# Patient Record
Sex: Female | Born: 1945
Health system: Southern US, Community
[De-identification: ages and names within clinical notes are randomized; demographics above are authoritative.]

## PROBLEM LIST (undated history)

## (undated) DIAGNOSIS — J439 Emphysema, unspecified: Secondary | ICD-10-CM

## (undated) DIAGNOSIS — F1010 Alcohol abuse, uncomplicated: Secondary | ICD-10-CM

## (undated) DIAGNOSIS — M810 Age-related osteoporosis without current pathological fracture: Secondary | ICD-10-CM

## (undated) DIAGNOSIS — D649 Anemia, unspecified: Secondary | ICD-10-CM

## (undated) DIAGNOSIS — E871 Hypo-osmolality and hyponatremia: Secondary | ICD-10-CM

## (undated) DIAGNOSIS — J449 Chronic obstructive pulmonary disease, unspecified: Secondary | ICD-10-CM

## (undated) DIAGNOSIS — C801 Malignant (primary) neoplasm, unspecified: Secondary | ICD-10-CM

## (undated) DIAGNOSIS — C3490 Malignant neoplasm of unspecified part of unspecified bronchus or lung: Secondary | ICD-10-CM

## (undated) DIAGNOSIS — D0510 Intraductal carcinoma in situ of unspecified breast: Secondary | ICD-10-CM

## (undated) DIAGNOSIS — N3281 Overactive bladder: Secondary | ICD-10-CM

## (undated) DIAGNOSIS — R0600 Dyspnea, unspecified: Secondary | ICD-10-CM

## (undated) DIAGNOSIS — I739 Peripheral vascular disease, unspecified: Secondary | ICD-10-CM

## (undated) DIAGNOSIS — Z720 Tobacco use: Secondary | ICD-10-CM

## (undated) HISTORY — PX: MASTECTOMY: SHX3

## (undated) HISTORY — DX: Hypo-osmolality and hyponatremia: E87.1

## (undated) HISTORY — PX: RETINAL DETACHMENT SURGERY: SHX105

## (undated) HISTORY — DX: Tobacco use: Z72.0

## (undated) HISTORY — PX: BREAST SURGERY: SHX581

## (undated) HISTORY — DX: Emphysema, unspecified: J43.9

## (undated) HISTORY — DX: Intraductal carcinoma in situ of unspecified breast: D05.10

## (undated) HISTORY — DX: Malignant (primary) neoplasm, unspecified: C80.1

## (undated) HISTORY — PX: EYE SURGERY: SHX253

## (undated) HISTORY — DX: Malignant neoplasm of unspecified part of unspecified bronchus or lung: C34.90

## (undated) HISTORY — DX: Age-related osteoporosis without current pathological fracture: M81.0

## (undated) HISTORY — PX: IR FIBRIN GLUE REPAIR ANAL FISTULA: IMG2325

## (undated) HISTORY — DX: Chronic obstructive pulmonary disease, unspecified: J44.9

## (undated) HISTORY — DX: Peripheral vascular disease, unspecified: I73.9

## (undated) HISTORY — DX: Overactive bladder: N32.81

## (undated) HISTORY — PX: OTHER SURGICAL HISTORY: SHX169

## (undated) HISTORY — PX: VASCULAR SURGERY: SHX849

---

## 2012-01-26 DIAGNOSIS — C50519 Malignant neoplasm of lower-outer quadrant of unspecified female breast: Secondary | ICD-10-CM | POA: Insufficient documentation

## 2012-10-04 DIAGNOSIS — H543 Unqualified visual loss, both eyes: Secondary | ICD-10-CM | POA: Insufficient documentation

## 2013-03-08 DIAGNOSIS — Z0000 Encounter for general adult medical examination without abnormal findings: Secondary | ICD-10-CM | POA: Insufficient documentation

## 2013-03-08 DIAGNOSIS — N3281 Overactive bladder: Secondary | ICD-10-CM | POA: Insufficient documentation

## 2013-05-04 DIAGNOSIS — J309 Allergic rhinitis, unspecified: Secondary | ICD-10-CM | POA: Insufficient documentation

## 2014-05-25 DIAGNOSIS — Z853 Personal history of malignant neoplasm of breast: Secondary | ICD-10-CM | POA: Insufficient documentation

## 2016-08-22 DIAGNOSIS — E871 Hypo-osmolality and hyponatremia: Secondary | ICD-10-CM | POA: Insufficient documentation

## 2016-09-24 DIAGNOSIS — R918 Other nonspecific abnormal finding of lung field: Secondary | ICD-10-CM | POA: Insufficient documentation

## 2016-10-01 DIAGNOSIS — F17200 Nicotine dependence, unspecified, uncomplicated: Secondary | ICD-10-CM | POA: Insufficient documentation

## 2016-12-02 LAB — PULMONARY FUNCTION TEST

## 2017-06-22 DIAGNOSIS — R2681 Unsteadiness on feet: Secondary | ICD-10-CM | POA: Insufficient documentation

## 2017-06-22 DIAGNOSIS — R208 Other disturbances of skin sensation: Secondary | ICD-10-CM | POA: Insufficient documentation

## 2017-06-22 DIAGNOSIS — R200 Anesthesia of skin: Secondary | ICD-10-CM | POA: Insufficient documentation

## 2017-10-26 DIAGNOSIS — E278 Other specified disorders of adrenal gland: Secondary | ICD-10-CM | POA: Insufficient documentation

## 2017-10-27 DIAGNOSIS — K921 Melena: Secondary | ICD-10-CM | POA: Insufficient documentation

## 2018-01-21 DIAGNOSIS — M810 Age-related osteoporosis without current pathological fracture: Secondary | ICD-10-CM | POA: Insufficient documentation

## 2018-02-02 DIAGNOSIS — D509 Iron deficiency anemia, unspecified: Secondary | ICD-10-CM | POA: Insufficient documentation

## 2018-12-21 ENCOUNTER — Encounter: Payer: Self-pay | Admitting: Neurology

## 2019-01-24 ENCOUNTER — Encounter: Payer: Self-pay | Admitting: Neurology

## 2019-01-24 ENCOUNTER — Ambulatory Visit (INDEPENDENT_AMBULATORY_CARE_PROVIDER_SITE_OTHER): Payer: Medicare Other | Admitting: Neurology

## 2019-01-24 ENCOUNTER — Other Ambulatory Visit: Payer: Self-pay

## 2019-01-24 ENCOUNTER — Other Ambulatory Visit: Payer: Medicare Other

## 2019-01-24 VITALS — BP 137/66 | HR 81 | Ht 62.0 in | Wt 89.0 lb

## 2019-01-24 DIAGNOSIS — G621 Alcoholic polyneuropathy: Secondary | ICD-10-CM

## 2019-01-24 NOTE — Patient Instructions (Addendum)
Continue gabapentin 300-400mg  three times daily  Try to cut back, if not stop, alcohol consumption  If you choose to proceed with physical therapy, please let me know Return to clinic in 6 months   Your provider has requested that you have labwork completed today. Please go to [facility] ([phone]) on the second floor of this building before leaving the office today. You do not need to check in. If you are not called within 15 minutes please check with the front desk.

## 2019-01-24 NOTE — Progress Notes (Signed)
[facility] Clinic Note - Initial Visit   Date: 01/24/19  Beth Daniels MRN: [id number] DOB: 06-25-1946   Dear Radford Pax, NP:  Thank you for your kind referral of Beth Daniels for consultation of bilateral leg paresthesia. Although her history is well known to you, please allow [place] to reiterate it for the purpose of our medical record. The patient was accompanied to the clinic by husband who also provides collateral information.     History of Present Illness: Beth Daniels is a 73 y.o. right-handed female with history of right breast cancer (2013) s/p bilateral mastectomy, COPD, peripheral vascular disease, and tobacco abuse referred for evaluation of bilateral feet dysesthesias.  She moved to [place] in early July.  She has been followed at [facility] neurology from 2018 - 2020 for chronic burning discomfort involving the lower legs since 2013.  She was initially evaluated by Dr Fritzi Mandes, neurologist in [place], [place] in 2015 where MRI brain, EMG of the legs, and lab testing was essentially normal.  In April 2018, she was seen by [facility] vascular surgery and underwent left superficial femoral artery angioplasty and stenting.  There was no change to her leg symptoms following treatment of vascular disease.  In 2018, she establish care with [facility], Rosalie Doctor, NP, for ongoing symptoms.  She underwent repeat electrodiagnostic testing in December 2018 which was normal.  There was suspicion that she may have small fiber neuropathy, however given that it would not change management, she did not undergo skin biopsy and treated symptomatically.  She takes gabapentin 300-400mg  TID, adjusted based on severity of pain.  Currently, she reports having stabbing/burning sensation at the soles of the feet and into the lower legs.   She has been drinking 3-4 glasses of wine daily for the past 30 years. Labs indicate history of folate deficiency, she is taking folate 1mg  daily.  No history of  diabetes or family history of neuropathy.   Out-side paper records, electronic medical record, and images have been reviewed where available and summarized as:   - EMG/NCS (06/2017): no evidence of large fiber neuropathy - Doppler arterial testing with ABI of the legs b/l (09/2013): showed evidence indicative of possible moderate small vessel disease in both feet. No obvious large vessel arterial obstructive disease was noted. The ABI measurements were normal bilaterally. - Brain MRI (07/2013): mild age-related chronic ischemic deep white matter changes. - EMG/NCS (07/2013): Needle EMG testing of the bilateral lower remedies show no abnormalities. NCS of the upper and lower extremities was also normal. In particular, no evidence of a diffuse polyneuropathy was seen. - Labs (2015): thyroid normal, ANA titer 1:40 (speckled), aldolase level normal, SPEP/IFE with no evidence of a monoclonal gammopathy, RF negative, CRP normal, total CK level normal.   Past Medical History:  Diagnosis Date  . Cancer [facility])    breast right  . Chronic hyponatremia   . COPD (chronic obstructive pulmonary disease) ([place])   . DCIS (ductal carcinoma in situ)   . Emphysema of lung ([place])   . Osteoporosis   . Overactive bladder   . PVD (peripheral vascular disease) ([place])   . Tobacco use     Past Surgical History:  Procedure Laterality Date  . aeptoplasty    . cartaract extraction    . IR FIBRIN GLUE REPAIR ANAL FISTULA    . MASTECTOMY     s/p bl  . RETINAL DETACHMENT SURGERY    . sclerotherapy vein leg    . vitreous retinal  surgery       Medications:  Outpatient Encounter Medications as of 01/24/2019  Medication Sig  . albuterol (VENTOLIN HFA) 108 (90 Base) MCG/ACT inhaler Inhale into the lungs.  [name] atorvastatin (LIPITOR) 20 MG tablet Take by mouth daily.  [name] azelastine (ASTELIN) 0.1 % nasal spray USE 1 SPRAY IN EACH NOSTRIL TWICE A DAY  . Calcium Carbonate-Vitamin D (CALCIUM 500 + D) 500-125 MG-UNIT TABS Take  by mouth daily.  . camphor-menthol (SARNA) lotion Apply topically.  . clopidogrel (PLAVIX) 75 MG tablet Take by mouth.  . conjugated estrogens (PREMARIN) vaginal cream Place vaginally.  . Cranberry 400 MG CAPS Take by mouth.  . denosumab (PROLIA) 60 MG/ML SOSY injection Inject into the skin.  . Ferrous Fumarate (HEMOCYTE - 106 MG FE) 324 (106 Fe) MG TABS tablet Take by mouth.  . folic acid (FOLVITE) 1 MG tablet Take by mouth.  . gabapentin (NEURONTIN) 100 MG capsule Take 100 mg by mouth daily.  [name] gabapentin (NEURONTIN) 300 MG capsule Take by mouth.  [name] guaiFENesin (MUCINEX) 600 MG 12 hr tablet Take by mouth.  . magnesium oxide (MAG-OX) 400 MG tablet Take by mouth.  . Multiple Vitamin (MULTI-VITAMIN) tablet Take by mouth.  . mupirocin ointment (BACTROBAN) 2 % Apply topically.  . nicotine polacrilex (COMMIT) 4 MG lozenge Start 4 weeks prior to Quit Day.  Place 1 lozenge between cheek and gum every 15 minutes for urges  . OXYGEN 2 liters at hs  . pantoprazole (PROTONIX) 40 MG tablet TAKE 1 TABLET DAILY  . umeclidinium-vilanterol (ANORO ELLIPTA) 62.5-25 MCG/INH AEPB Inhale into the lungs.   No facility-administered encounter medications on file as of 01/24/2019.     Allergies: No Known Allergies  Family History: Family History  Problem Relation Age of Onset  . Alzheimer's disease Mother   . Pneumonia Father        aspiration pneumonia    Social History: Social History   Tobacco Use  . Smoking status: Former Smoker    Packs/day: 1.50    Years: 50.00    Pack years: 75.00    Start date: 1970    Quit date: 2018    Years since quitting: 2.5  . Smokeless tobacco: Never Used  Substance Use Topics  . Alcohol use: Yes  . Drug use: Not Currently   Social History   Social History Narrative   Right handed, no children, one story with stairs. Lives with husband    Review of Systems:  CONSTITUTIONAL: No fevers, chills, night sweats, or weight loss.   EYES: No visual changes or eye  pain ENT: No hearing changes.  No history of nose bleeds.   RESPIRATORY: No cough, wheezing and shortness of breath.   CARDIOVASCULAR: Negative for chest pain, and palpitations.   GI: Negative for abdominal discomfort, blood in stools or black stools.  No recent change in bowel habits.   GU:  No history of incontinence.   MUSCLOSKELETAL: No history of joint pain or swelling.  No myalgias.   SKIN: Negative for lesions, rash, and itching.   HEMATOLOGY/ONCOLOGY: Negative for prolonged bleeding, bruising easily, and swollen nodes.  +history of cancer.   ENDOCRINE: Negative for cold or heat intolerance, polydipsia or goiter.   PSYCH:  No depression or anxiety symptoms.   NEURO: As Above.   Vital Signs:  BP 137/66   Pulse 81   Ht 5\' 2"  (1.575 m)   Wt 89 lb (40.4 kg)   SpO2 95%   BMI 16.28 kg/m  General Medical Exam:   General:  Thin-appearing, comfortable.   Eyes/ENT: see cranial nerve examination.   Neck:   No carotid bruits. Respiratory:  Clear to auscultation, good air entry bilaterally.   Cardiac:  Regular rate and rhythm, no murmur.   Extremities:  No deformities, edema, or skin discoloration.  Skin:  Multiple ecchymosis and hyperpigmentation of the legs   Neurological Exam: MENTAL STATUS including orientation to time, place, person, recent and remote memory, attention span and concentration, language, and fund of knowledge is normal.  Speech is not dysarthric.  CRANIAL NERVES: II:  No visual field defects.   III-IV-VI: Pupils equal round and reactive to light.  Normal conjugate, extra-ocular eye movements in all directions of gaze.  No nystagmus.  No ptosis.   V:  Normal facial sensation.    VII:  Normal facial symmetry and movements.   VIII:  Normal hearing and vestibular function.   IX-X:  Normal palatal movement.   XI:  Normal shoulder shrug and head rotation.   XII:  Normal tongue strength and range of motion, no deviation or fasciculation.  MOTOR:  Generalized  loss of muscle bulk throughout.  No atrophy, fasciculations or abnormal movements.  No pronator drift.   Upper Extremity:  Right  Left  Deltoid  5/5   5/5   Biceps  5/5   5/5   Triceps  5/5   5/5   Infraspinatus 5/5  5/5  Medial pectoralis 5/5  5/5  Wrist extensors  5/5   5/5   Wrist flexors  5/5   5/5   Finger extensors  5/5   5/5   Finger flexors  5/5   5/5   Dorsal interossei  5/5   5/5   Abductor pollicis  5/5   5/5   Tone (Ashworth scale)  0  0   Lower Extremity:  Right  Left  Hip flexors  5/5   5/5   Hip extensors  5/5   5/5   Adductor 5/5  5/5  Abductor 5/5  5/5  Knee flexors  5/5   5/5   Knee extensors  5/5   5/5   Dorsiflexors  5/5   5/5   Plantarflexors  5/5   5/5   Toe extensors  5/5   5/5   Toe flexors  5-/5   5-/5   Tone (Ashworth scale)  0  0   MSRs:  Right        Left                  brachioradialis 2+  2+  biceps 2+  2+  triceps 2+  2+  patellar 2+  2+  ankle jerk 1+  1+  [place] no  no  plantar response down  down   SENSORY:  Normal and symmetric perception of light touch, pinprick, vibration.  [place]'s sign present.   COORDINATION/GAIT: Normal finger-to- nose-finger and heel-to-shin.  Intact rapid alternating movements bilaterally.  Gait is mildly wide-based, assisted with cane, stable.  She can stand on heels and toes.    IMPRESSION: Probable small fiber neuropathy due to alcohol use manifesting with chronic bilateral lower leg burning dysesthesias since 2013.  She has underwent NCS/EMG x 2 most recently in 2018 which did not show a large fiber neuropathy.  Skin biopsy was not performed given that it would not change management.  She has been managed symptomatically with gabapentin 300-400mg  TID for pain, which seems to be effective.  She will contact  my office when she is in need of refills.  Physical therapy was declined at this time.  Fall precautions discussed.    I stressed the importance of trying to cut back, if not stop, alcohol use as this  is the underlying etiology to her leg pain.  It was explained that alcohol itself is neurotoxic and as well as associated vitamin deficiencies which further potentiates neural injury.  Check vitamin B12, vitamin B1, folate.  She was encouraged to eat three nutritious meals daily to ensure she gets appropriate requirement of essential vitamins/minerals and calories.  Her weight is low and I suspect alcohol is suppressing her appetite.     Return to clinic in 6 months.    Thank you for allowing me to participate in patient's care.  If I can answer any additional questions, I would be pleased to do so.    Sincerely,    Lizania Bouchard K. Posey Pronto, DO

## 2019-01-27 ENCOUNTER — Ambulatory Visit (HOSPITAL_COMMUNITY): Payer: Medicare Other

## 2019-01-27 ENCOUNTER — Ambulatory Visit (INDEPENDENT_AMBULATORY_CARE_PROVIDER_SITE_OTHER)
Admission: EM | Admit: 2019-01-27 | Discharge: 2019-01-27 | Disposition: A | Discharge status: Home / Self Care | Payer: Medicare Other | Source: Home / Self Care | Attending: Emergency Medicine | Admitting: Emergency Medicine

## 2019-01-27 ENCOUNTER — Other Ambulatory Visit: Payer: Self-pay

## 2019-01-27 ENCOUNTER — Encounter (HOSPITAL_COMMUNITY): Payer: Self-pay | Admitting: Emergency Medicine

## 2019-01-27 DIAGNOSIS — Z86000 Personal history of in-situ neoplasm of breast: Secondary | ICD-10-CM | POA: Insufficient documentation

## 2019-01-27 DIAGNOSIS — S300XXA Contusion of lower back and pelvis, initial encounter: Secondary | ICD-10-CM

## 2019-01-27 DIAGNOSIS — M810 Age-related osteoporosis without current pathological fracture: Secondary | ICD-10-CM | POA: Insufficient documentation

## 2019-01-27 DIAGNOSIS — Z87891 Personal history of nicotine dependence: Secondary | ICD-10-CM | POA: Insufficient documentation

## 2019-01-27 DIAGNOSIS — J90 Pleural effusion, not elsewhere classified: Secondary | ICD-10-CM | POA: Insufficient documentation

## 2019-01-27 DIAGNOSIS — J918 Pleural effusion in other conditions classified elsewhere: Secondary | ICD-10-CM | POA: Diagnosis not present

## 2019-01-27 DIAGNOSIS — J439 Emphysema, unspecified: Secondary | ICD-10-CM | POA: Insufficient documentation

## 2019-01-27 DIAGNOSIS — G629 Polyneuropathy, unspecified: Secondary | ICD-10-CM | POA: Insufficient documentation

## 2019-01-27 DIAGNOSIS — R0789 Other chest pain: Secondary | ICD-10-CM | POA: Insufficient documentation

## 2019-01-27 DIAGNOSIS — W19XXXA Unspecified fall, initial encounter: Secondary | ICD-10-CM | POA: Insufficient documentation

## 2019-01-27 DIAGNOSIS — Z9013 Acquired absence of bilateral breasts and nipples: Secondary | ICD-10-CM | POA: Insufficient documentation

## 2019-01-27 DIAGNOSIS — Z853 Personal history of malignant neoplasm of breast: Secondary | ICD-10-CM | POA: Insufficient documentation

## 2019-01-27 DIAGNOSIS — I739 Peripheral vascular disease, unspecified: Secondary | ICD-10-CM | POA: Insufficient documentation

## 2019-01-27 DIAGNOSIS — Z79899 Other long term (current) drug therapy: Secondary | ICD-10-CM | POA: Insufficient documentation

## 2019-01-27 DIAGNOSIS — Z7902 Long term (current) use of antithrombotics/antiplatelets: Secondary | ICD-10-CM | POA: Insufficient documentation

## 2019-01-27 NOTE — ED Triage Notes (Signed)
PT fell Friday night. PT fell backwards onto the floor. PT reports she hit her tailbone, back, and head. No LOC  PT has continued lower back pain and pain over right rib when taking a deep breath.   PT has history of osteoporosis

## 2019-01-27 NOTE — ED Provider Notes (Signed)
HPI  SUBJECTIVE:  Beth Daniels is a 73 y.o. female who presents with bilateral low back pain and right anterior chest pain starting 4 days ago after losing her balance and falling, landing on her sacrum, back, hitting the back of her head onto a tile floor.  No LOC.  No nausea, vomiting, discoordination, visual changes, dysarthria, arm or leg weakness, neck pain.  She states the back pain is constant, achy, getting worse.  She tried a lidocaine patch for that with some improvement in her symptoms, symptoms are worse with bending forward, walking, torso movement.  No urinary or fecal incontinence, urinary retention, saddle anesthesia.  The pain does not radiate down the back of her leg.  No leg weakness.  No change in her baseline numbness in her feet.  She also reports right front rib pain is present with deep inspiration only.  Denies any direct trauma to this area.  It is not associated with torso rotation, coughing, sneezing.  She notes a low oxygen saturation on Saturday and Sunday morning improved with deep breathing.  She has a past medical history of osteoporosis, COPD on supplemental oxygen at night, she is on Plavix for stent in her left thigh, she has a history of breast cancer status post bilateral mastectomy.  Also peripheral neuropathy, GI bleed, coronary disease.  No history of pneumothorax, head injury, SAH/ICH.  PMD: Shon Baton, MD   Past Medical History:  Diagnosis Date  . Cancer [facility])    breast right  . Chronic hyponatremia   . COPD (chronic obstructive pulmonary disease) ([place])   . DCIS (ductal carcinoma in situ)   . Emphysema of lung ([place])   . Osteoporosis   . Overactive bladder   . PVD (peripheral vascular disease) ([place])   . Tobacco use     Past Surgical History:  Procedure Laterality Date  . aeptoplasty    . cartaract extraction    . IR FIBRIN GLUE REPAIR ANAL FISTULA    . MASTECTOMY     s/p bl  . RETINAL DETACHMENT SURGERY    . sclerotherapy vein leg    . vitreous  retinal surgery      Family History  Problem Relation Age of Onset  . Alzheimer's disease Mother   . Pneumonia Father        aspiration pneumonia    Social History   Tobacco Use  . Smoking status: Former Smoker    Packs/day: 1.50    Years: 50.00    Pack years: 75.00    Start date: 1970    Quit date: 2018    Years since quitting: 2.5  . Smokeless tobacco: Never Used  Substance Use Topics  . Alcohol use: Yes  . Drug use: Not Currently    No current facility-administered medications for this encounter.   Current Outpatient Medications:  .  albuterol (VENTOLIN HFA) 108 (90 Base) MCG/ACT inhaler, Inhale into the lungs., Disp: , Rfl:  .  atorvastatin (LIPITOR) 20 MG tablet, Take by mouth daily., Disp: , Rfl:  .  azelastine (ASTELIN) 0.1 % nasal spray, USE 1 SPRAY IN EACH NOSTRIL TWICE A DAY, Disp: , Rfl:  .  Calcium Carbonate-Vitamin D (CALCIUM 500 + D) 500-125 MG-UNIT TABS, Take by mouth daily., Disp: , Rfl:  .  camphor-menthol (SARNA) lotion, Apply topically., Disp: , Rfl:  .  clopidogrel (PLAVIX) 75 MG tablet, Take by mouth., Disp: , Rfl:  .  conjugated estrogens (PREMARIN) vaginal cream, Place vaginally., Disp: , Rfl:  .  Cranberry 400 MG CAPS, Take by mouth., Disp: , Rfl:  .  denosumab (PROLIA) 60 MG/ML SOSY injection, Inject into the skin., Disp: , Rfl:  .  Ferrous Fumarate (HEMOCYTE - 106 MG FE) 324 (106 Fe) MG TABS tablet, Take by mouth., Disp: , Rfl:  .  folic acid (FOLVITE) 1 MG tablet, Take by mouth., Disp: , Rfl:  .  gabapentin (NEURONTIN) 100 MG capsule, Take 100 mg by mouth daily., Disp: , Rfl:  .  gabapentin (NEURONTIN) 300 MG capsule, Take by mouth., Disp: , Rfl:  .  guaiFENesin (MUCINEX) 600 MG 12 hr tablet, Take by mouth., Disp: , Rfl:  .  magnesium oxide (MAG-OX) 400 MG tablet, Take by mouth., Disp: , Rfl:  .  Multiple Vitamin (MULTI-VITAMIN) tablet, Take by mouth., Disp: , Rfl:  .  mupirocin ointment (BACTROBAN) 2 %, Apply topically., Disp: , Rfl:  .   nicotine polacrilex (COMMIT) 4 MG lozenge, Start 4 weeks prior to Quit Day.  Place 1 lozenge between cheek and gum every 15 minutes for urges, Disp: , Rfl:  .  OXYGEN, 2 liters at hs, Disp: , Rfl:  .  pantoprazole (PROTONIX) 40 MG tablet, TAKE 1 TABLET DAILY, Disp: , Rfl:  .  umeclidinium-vilanterol (ANORO ELLIPTA) 62.5-25 MCG/INH AEPB, Inhale into the lungs., Disp: , Rfl:   No Known Allergies   ROS  As noted in HPI.   Physical Exam  BP (!) 151/81 (BP Location: Left Arm)   Pulse 94   Temp 98.1 F (36.7 C) (Oral)   Resp 16   SpO2 97%   Constitutional: Well developed, well nourished, no acute distress Eyes: PERRL, EOMI, conjunctiva normal bilaterally HENT: Normocephalic, atraumatic,mucus membranes moist Respiratory: Good respiratory effort, clear to auscultation bilaterally, no rales, no wheezing, no rhonchi.  Bilateral mastectomy.  No bruising, rash.  Positive anterior tenderness along ribs 3, 4 and in between the ribs.  No crepitus. Cardiovascular: Normal rate and rhythm, no murmurs, no gallops, no rubs GI: Nondistended Back: No bruising.  Positive bony tenderness at L4, L5, S1.  Mild bilateral paralumbar tenderness.  Back pain aggravated with left hip flexion against resistance and right hip external rotation.  Range of motion of the hip is otherwise not painful..Bilateral lower extremities nontender, baseline ROM with intact DP  pulses,  SLR neg bilaterally. Sensation baseline light touch bilaterally for Pt,  Motor symmetric bilateral 5/5 hip flexion, quadriceps, hamstrings, EHL, foot dorsiflexion, foot plantarflexion, gait somewhat antalgic but without apparent new ataxia. skin: No rash, skin intact Musculoskeletal: No edema, no tenderness, no deformities Neurologic: Alert & oriented x 3, CN II-XII grossly intact, no motor deficits, sensation grossly intact Psychiatric: Speech and behavior appropriate   ED Course   Medications - No data to display  Orders Placed This  Encounter  Procedures  . DG Ribs Unilateral W/Chest Right    Standing Status:   Standing    Number of Occurrences:   1    Order Specific Question:   Reason for Exam (SYMPTOM  OR DIAGNOSIS REQUIRED)    Answer:   fall tenderness rib 3-4 r/o fx ptx  . DG Lumbar Spine Complete    Standing Status:   Standing    Number of Occurrences:   1    Order Specific Question:   Reason for Exam (SYMPTOM  OR DIAGNOSIS REQUIRED)    Answer:   fall tenderness rib 3-4 r/o fx ptx   No results found for this or any previous visit (from the past 24  hour(s)). Dg Ribs Unilateral W/chest Right  Result Date: 01/27/2019 CLINICAL DATA:  Fall, right rib tenderness. EXAM: RIGHT RIBS AND CHEST - 3+ VIEW COMPARISON:  None. FINDINGS: There is hyperinflation of the lungs compatible with COPD. Heart is normal size. Lungs clear. Small right pleural effusion with blunting of the right costophrenic angle. No pneumothorax. No acute bony abnormality. No visible rib fracture. IMPRESSION: Small right pleural effusion. No visible pneumothorax or rib fracture. COPD. Electronically Signed   By: Rolm Baptise M.D.   On: 01/27/2019 11:43   Dg Lumbar Spine Complete  Result Date: 01/27/2019 CLINICAL DATA:  Pt fell on Sunday Low back pain Right ant rib pain EXAM: LUMBAR SPINE - COMPLETE 4+ VIEW COMPARISON:  None. FINDINGS: There is normal alignment of the lumbar spine. Disc height loss identified primarily at L2-3, L3-4. There is uncovertebral spurring and facet hypertrophy at these levels. No acute fracture or spondylolisthesis. Bowel gas pattern is nonobstructive. There is moderate atherosclerosis of the abdominal aorta. IMPRESSION: 1. Degenerative changes. 2. No evidence for acute abnormality. Electronically Signed   By: Nolon Nations M.D.   On: 01/27/2019 11:46    ED Clinical Impression  1. Fall, initial encounter   2. Lumbar contusion, initial encounter   3. Chest wall pain      ED Assessment/Plan   1.  Back pain.  Obtaining  L-spine x-ray due to bony tenderness and osteoporosis.  No evidence of cauda equina syndrome today.  2 anterior rib pain.  Getting right-sided rib series to rule out pneumothorax, fracture.  Patient has no neurologic complaints, no headache, is neurologically intact, and even though she is on Plavix, I think that we can defer head CT today.  Discussed with her that she is at risk for subdural or up to a week after the injury, also will have her keep a close eye on this, she is to go to the ER for headache or any neurologic complaints.  Reviewed imaging independently.  Small right-sided effusion.  No pneumothorax, rib fracture.  No previous x-ray for comparison.  L-spine, degenerative changes, no acute abnormalities.. See radiology report for full details.  Patient status post mechanical fall with a lumbar contusion and right chest wall pain.  Patient also with a small pleural effusion, not sure if this is new or not as there are no previous x-rays for comparison.  She has follow-up with her pulmonologist soon and will discuss this with her pulmonologist.  Faxing today's note to Dr. Shon Baton.   Home with Tylenol 1 g 3-4 times a day as needed for pain.  I am hesitant to prescribe opiates as it increases her risk of fall.  She agrees with this.  Discussed imaging, MDM, treatment plan, and plan for follow-up with patient Discussed sn/sx that should prompt return to the ED. patient agrees with plan.   No orders of the defined types were placed in this encounter.   *This clinic note was created using Dragon dictation software. Therefore, there may be occasional mistakes despite careful proofreading.  ?   Melynda Ripple, MD 01/27/19 1247

## 2019-01-27 NOTE — Discharge Instructions (Addendum)
Your rib XRays did not show a collapsed lung or fracture.  Your back x-rays were also negative for fracture.  1 g of Tylenol 3 or 4 times a day as needed for pain.  Follow-up with your doctor if not getting better in a week, go to the ER for headache, trouble talking, walking, slurred speech, visual changes, arm or leg weakness, numbness, any strokelike symptoms, or for pain not controlled with medications.

## 2019-01-29 ENCOUNTER — Inpatient Hospital Stay (HOSPITAL_COMMUNITY)
Admission: EM | Admit: 2019-01-29 | Discharge: 2019-02-02 | DRG: 480 | Disposition: A | Discharge status: Home Health | Payer: Medicare Other | Attending: Internal Medicine | Admitting: Internal Medicine

## 2019-01-29 ENCOUNTER — Other Ambulatory Visit: Payer: Self-pay

## 2019-01-29 ENCOUNTER — Emergency Department (HOSPITAL_COMMUNITY): Payer: Medicare Other

## 2019-01-29 ENCOUNTER — Encounter (HOSPITAL_COMMUNITY): Payer: Self-pay | Admitting: Emergency Medicine

## 2019-01-29 ENCOUNTER — Inpatient Hospital Stay (HOSPITAL_COMMUNITY): Payer: Medicare Other

## 2019-01-29 DIAGNOSIS — J439 Emphysema, unspecified: Secondary | ICD-10-CM | POA: Diagnosis present

## 2019-01-29 DIAGNOSIS — W1811XA Fall from or off toilet without subsequent striking against object, initial encounter: Secondary | ICD-10-CM | POA: Diagnosis present

## 2019-01-29 DIAGNOSIS — Z20828 Contact with and (suspected) exposure to other viral communicable diseases: Secondary | ICD-10-CM | POA: Diagnosis not present

## 2019-01-29 DIAGNOSIS — M810 Age-related osteoporosis without current pathological fracture: Secondary | ICD-10-CM | POA: Diagnosis present

## 2019-01-29 DIAGNOSIS — S72142A Displaced intertrochanteric fracture of left femur, initial encounter for closed fracture: Principal | ICD-10-CM | POA: Diagnosis present

## 2019-01-29 DIAGNOSIS — Z9981 Dependence on supplemental oxygen: Secondary | ICD-10-CM | POA: Diagnosis not present

## 2019-01-29 DIAGNOSIS — Z9013 Acquired absence of bilateral breasts and nipples: Secondary | ICD-10-CM | POA: Diagnosis not present

## 2019-01-29 DIAGNOSIS — S5002XA Contusion of left elbow, initial encounter: Secondary | ICD-10-CM | POA: Diagnosis present

## 2019-01-29 DIAGNOSIS — Z87891 Personal history of nicotine dependence: Secondary | ICD-10-CM | POA: Diagnosis not present

## 2019-01-29 DIAGNOSIS — Z01818 Encounter for other preprocedural examination: Secondary | ICD-10-CM

## 2019-01-29 DIAGNOSIS — Z853 Personal history of malignant neoplasm of breast: Secondary | ICD-10-CM | POA: Diagnosis not present

## 2019-01-29 DIAGNOSIS — Y92012 Bathroom of single-family (private) house as the place of occurrence of the external cause: Secondary | ICD-10-CM | POA: Diagnosis not present

## 2019-01-29 DIAGNOSIS — J9611 Chronic respiratory failure with hypoxia: Secondary | ICD-10-CM | POA: Diagnosis present

## 2019-01-29 DIAGNOSIS — E871 Hypo-osmolality and hyponatremia: Secondary | ICD-10-CM | POA: Diagnosis present

## 2019-01-29 DIAGNOSIS — E43 Unspecified severe protein-calorie malnutrition: Secondary | ICD-10-CM | POA: Diagnosis not present

## 2019-01-29 DIAGNOSIS — Z820 Family history of epilepsy and other diseases of the nervous system: Secondary | ICD-10-CM

## 2019-01-29 DIAGNOSIS — Z681 Body mass index (BMI) 19 or less, adult: Secondary | ICD-10-CM

## 2019-01-29 DIAGNOSIS — G621 Alcoholic polyneuropathy: Secondary | ICD-10-CM | POA: Diagnosis present

## 2019-01-29 DIAGNOSIS — E875 Hyperkalemia: Secondary | ICD-10-CM | POA: Diagnosis not present

## 2019-01-29 DIAGNOSIS — D62 Acute posthemorrhagic anemia: Secondary | ICD-10-CM | POA: Diagnosis not present

## 2019-01-29 DIAGNOSIS — R0600 Dyspnea, unspecified: Secondary | ICD-10-CM

## 2019-01-29 DIAGNOSIS — Z7902 Long term (current) use of antithrombotics/antiplatelets: Secondary | ICD-10-CM | POA: Diagnosis not present

## 2019-01-29 DIAGNOSIS — Z95820 Peripheral vascular angioplasty status with implants and grafts: Secondary | ICD-10-CM

## 2019-01-29 DIAGNOSIS — F1029 Alcohol dependence with unspecified alcohol-induced disorder: Secondary | ICD-10-CM

## 2019-01-29 DIAGNOSIS — I4510 Unspecified right bundle-branch block: Secondary | ICD-10-CM | POA: Diagnosis present

## 2019-01-29 DIAGNOSIS — E559 Vitamin D deficiency, unspecified: Secondary | ICD-10-CM | POA: Diagnosis present

## 2019-01-29 DIAGNOSIS — F1020 Alcohol dependence, uncomplicated: Secondary | ICD-10-CM | POA: Diagnosis present

## 2019-01-29 DIAGNOSIS — I739 Peripheral vascular disease, unspecified: Secondary | ICD-10-CM

## 2019-01-29 DIAGNOSIS — N179 Acute kidney failure, unspecified: Secondary | ICD-10-CM | POA: Diagnosis not present

## 2019-01-29 DIAGNOSIS — S72002A Fracture of unspecified part of neck of left femur, initial encounter for closed fracture: Secondary | ICD-10-CM | POA: Diagnosis not present

## 2019-01-29 DIAGNOSIS — S72002D Fracture of unspecified part of neck of left femur, subsequent encounter for closed fracture with routine healing: Secondary | ICD-10-CM | POA: Diagnosis not present

## 2019-01-29 DIAGNOSIS — E785 Hyperlipidemia, unspecified: Secondary | ICD-10-CM | POA: Diagnosis present

## 2019-01-29 DIAGNOSIS — J449 Chronic obstructive pulmonary disease, unspecified: Secondary | ICD-10-CM

## 2019-01-29 DIAGNOSIS — M549 Dorsalgia, unspecified: Secondary | ICD-10-CM | POA: Diagnosis present

## 2019-01-29 DIAGNOSIS — J441 Chronic obstructive pulmonary disease with (acute) exacerbation: Secondary | ICD-10-CM

## 2019-01-29 DIAGNOSIS — D539 Nutritional anemia, unspecified: Secondary | ICD-10-CM | POA: Diagnosis present

## 2019-01-29 DIAGNOSIS — W19XXXA Unspecified fall, initial encounter: Secondary | ICD-10-CM

## 2019-01-29 DIAGNOSIS — T148XXA Other injury of unspecified body region, initial encounter: Secondary | ICD-10-CM

## 2019-01-29 HISTORY — DX: Alcohol abuse, uncomplicated: F10.10

## 2019-01-29 LAB — CBC WITH DIFFERENTIAL/PLATELET
Abs Immature Granulocytes: 0.02 10*3/uL (ref 0.00–0.07)
Basophils Absolute: 0 10*3/uL (ref 0.0–0.1)
Basophils Relative: 0 %
Eosinophils Absolute: 0 10*3/uL (ref 0.0–0.5)
Eosinophils Relative: 0 %
HCT: 34.7 % — ABNORMAL LOW (ref 36.0–46.0)
Hemoglobin: 11.6 g/dL — ABNORMAL LOW (ref 12.0–15.0)
Immature Granulocytes: 0 %
Lymphocytes Relative: 17 %
Lymphs Abs: 0.8 10*3/uL (ref 0.7–4.0)
MCH: 34.8 pg — ABNORMAL HIGH (ref 26.0–34.0)
MCHC: 33.4 g/dL (ref 30.0–36.0)
MCV: 104.2 fL — ABNORMAL HIGH (ref 80.0–100.0)
Monocytes Absolute: 0.3 10*3/uL (ref 0.1–1.0)
Monocytes Relative: 6 %
Neutro Abs: 3.5 10*3/uL (ref 1.7–7.7)
Neutrophils Relative %: 77 %
Platelets: 279 10*3/uL (ref 150–400)
RBC: 3.33 MIL/uL — ABNORMAL LOW (ref 3.87–5.11)
RDW: 15.2 % (ref 11.5–15.5)
WBC: 4.6 10*3/uL (ref 4.0–10.5)
nRBC: 0 % (ref 0.0–0.2)

## 2019-01-29 LAB — BASIC METABOLIC PANEL
Anion gap: 15 (ref 5–15)
BUN: <5 mg/dL — ABNORMAL LOW (ref 8–23)
CO2: 21 mmol/L — ABNORMAL LOW (ref 22–32)
Calcium: 8.4 mg/dL — ABNORMAL LOW (ref 8.9–10.3)
Chloride: 96 mmol/L — ABNORMAL LOW (ref 98–111)
Creatinine, Ser: 0.36 mg/dL — ABNORMAL LOW (ref 0.44–1.00)
GFR calc Af Amer: >60 mL/min (ref >60)
GFR calc non Af Amer: >60 mL/min (ref >60)
Glucose, Bld: 72 mg/dL (ref 70–99)
Potassium: 4.1 mmol/L (ref 3.5–5.1)
Sodium: 132 mmol/L — ABNORMAL LOW (ref 135–145)

## 2019-01-29 LAB — SARS CORONAVIRUS 2 BY RT PCR (HOSPITAL ORDER, PERFORMED IN ~~LOC~~ HOSPITAL LAB): SARS Coronavirus 2: NEGATIVE

## 2019-01-29 LAB — PROTIME-INR
INR: 1 (ref 0.8–1.2)
Prothrombin Time: 12.6 seconds (ref 11.4–15.2)

## 2019-01-29 LAB — ABO/RH: ABO/RH(D): O POS

## 2019-01-29 MED ORDER — SODIUM CHLORIDE 0.9 % IV SOLN
INTRAVENOUS | Status: DC
  Administered 2019-01-29: 14:00:00 via INTRAVENOUS

## 2019-01-29 MED ORDER — CEFAZOLIN SODIUM-DEXTROSE 2-4 GM/100ML-% IV SOLN
2.0000 g | INTRAVENOUS | Status: AC
  Administered 2019-01-30: 2 g via INTRAVENOUS
  Filled 2019-01-29: qty 100

## 2019-01-29 MED ORDER — GUAIFENESIN ER 600 MG PO TB12
1200.0000 mg | ORAL_TABLET | Freq: Two times a day (BID) | ORAL | Status: DC
  Administered 2019-01-29 – 2019-02-02 (×7): 1200 mg via ORAL
  Filled 2019-01-29 (×7): qty 2

## 2019-01-29 MED ORDER — ADULT MULTIVITAMIN W/MINERALS CH
1.0000 | ORAL_TABLET | Freq: Every day | ORAL | Status: DC
  Administered 2019-01-31 – 2019-02-02 (×3): 1 via ORAL
  Filled 2019-01-29 (×3): qty 1

## 2019-01-29 MED ORDER — ALBUTEROL SULFATE (2.5 MG/3ML) 0.083% IN NEBU
3.0000 mL | INHALATION_SOLUTION | Freq: Three times a day (TID) | RESPIRATORY_TRACT | Status: DC
  Administered 2019-01-30: 08:00:00 3 mL via RESPIRATORY_TRACT
  Filled 2019-01-29: qty 3

## 2019-01-29 MED ORDER — MAGNESIUM OXIDE 400 (241.3 MG) MG PO TABS
200.0000 mg | ORAL_TABLET | Freq: Every day | ORAL | Status: DC
  Administered 2019-01-31 – 2019-02-02 (×3): 200 mg via ORAL
  Filled 2019-01-29 (×3): qty 1

## 2019-01-29 MED ORDER — LORAZEPAM 1 MG PO TABS: 0.0000 mg | ORAL_TABLET | Freq: Four times a day (QID) | ORAL | Status: AC

## 2019-01-29 MED ORDER — GABAPENTIN 300 MG PO CAPS
300.0000 mg | ORAL_CAPSULE | Freq: Every day | ORAL | Status: DC
  Administered 2019-01-29 – 2019-01-31 (×3): 300 mg via ORAL
  Filled 2019-01-29 (×3): qty 1

## 2019-01-29 MED ORDER — FENTANYL CITRATE (PF) 100 MCG/2ML IJ SOLN
50.0000 ug | INTRAMUSCULAR | Status: AC | PRN
  Administered 2019-01-29 (×2): 50 ug via INTRAVENOUS
  Filled 2019-01-29 (×2): qty 2

## 2019-01-29 MED ORDER — METHOCARBAMOL 1000 MG/10ML IJ SOLN
500.0000 mg | Freq: Four times a day (QID) | INTRAVENOUS | Status: DC | PRN
  Filled 2019-01-29: qty 5

## 2019-01-29 MED ORDER — MORPHINE SULFATE (PF) 2 MG/ML IV SOLN
0.5000 mg | INTRAVENOUS | Status: DC | PRN
  Administered 2019-01-30: 01:00:00 0.5 mg via INTRAVENOUS
  Filled 2019-01-29 (×2): qty 1

## 2019-01-29 MED ORDER — CHLORHEXIDINE GLUCONATE 4 % EX LIQD
60.0000 mL | Freq: Once | CUTANEOUS | Status: AC
  Administered 2019-01-30: 4 via TOPICAL
  Filled 2019-01-29: qty 60

## 2019-01-29 MED ORDER — ENSURE PRE-SURGERY PO LIQD
296.0000 mL | Freq: Once | ORAL | Status: DC
  Filled 2019-01-29: qty 296

## 2019-01-29 MED ORDER — PANTOPRAZOLE SODIUM 40 MG PO TBEC
40.0000 mg | DELAYED_RELEASE_TABLET | Freq: Every day | ORAL | Status: DC
  Administered 2019-01-29 – 2019-02-02 (×4): 40 mg via ORAL
  Filled 2019-01-29 (×4): qty 1

## 2019-01-29 MED ORDER — LORAZEPAM 2 MG/ML IJ SOLN: 1.0000 mg | Freq: Four times a day (QID) | INTRAMUSCULAR | Status: AC | PRN

## 2019-01-29 MED ORDER — ENSURE ENLIVE PO LIQD
237.0000 mL | Freq: Three times a day (TID) | ORAL | Status: DC
  Administered 2019-01-29 – 2019-02-02 (×6): 237 mL via ORAL

## 2019-01-29 MED ORDER — AZELASTINE HCL 0.1 % NA SOLN
1.0000 | Freq: Two times a day (BID) | NASAL | Status: DC
  Administered 2019-01-29 – 2019-02-02 (×7): 1 via NASAL
  Filled 2019-01-29: qty 30

## 2019-01-29 MED ORDER — METHOCARBAMOL 500 MG PO TABS
500.0000 mg | ORAL_TABLET | Freq: Four times a day (QID) | ORAL | Status: DC | PRN
  Administered 2019-01-30: 20:00:00 500 mg via ORAL
  Filled 2019-01-29: qty 1

## 2019-01-29 MED ORDER — POVIDONE-IODINE 10 % EX SWAB: 2.0000 "application " | Freq: Once | CUTANEOUS | Status: DC

## 2019-01-29 MED ORDER — THIAMINE HCL 100 MG/ML IJ SOLN: 100.0000 mg | Freq: Every day | INTRAMUSCULAR | Status: DC

## 2019-01-29 MED ORDER — LORAZEPAM 1 MG PO TABS: 1.0000 mg | ORAL_TABLET | Freq: Four times a day (QID) | ORAL | Status: AC | PRN

## 2019-01-29 MED ORDER — HYDROCODONE-ACETAMINOPHEN 5-325 MG PO TABS
1.0000 | ORAL_TABLET | Freq: Four times a day (QID) | ORAL | Status: DC | PRN
  Administered 2019-01-29: 22:00:00 1 via ORAL
  Administered 2019-01-29: 2 via ORAL
  Administered 2019-01-30 – 2019-01-31 (×4): 1 via ORAL
  Filled 2019-01-29 (×5): qty 1
  Filled 2019-01-29: qty 2

## 2019-01-29 MED ORDER — UMECLIDINIUM-VILANTEROL 62.5-25 MCG/INH IN AEPB
1.0000 | INHALATION_SPRAY | Freq: Every day | RESPIRATORY_TRACT | Status: DC
  Administered 2019-01-30 – 2019-01-31 (×2): 1 via RESPIRATORY_TRACT
  Filled 2019-01-29: qty 14

## 2019-01-29 MED ORDER — VITAMIN B-1 100 MG PO TABS
100.0000 mg | ORAL_TABLET | Freq: Every day | ORAL | Status: DC
  Administered 2019-01-29 – 2019-02-02 (×4): 100 mg via ORAL
  Filled 2019-01-29 (×4): qty 1

## 2019-01-29 MED ORDER — FOLIC ACID 1 MG PO TABS
1.0000 mg | ORAL_TABLET | Freq: Every day | ORAL | Status: DC
  Administered 2019-01-31 – 2019-02-02 (×3): 1 mg via ORAL
  Filled 2019-01-29 (×4): qty 1

## 2019-01-29 MED ORDER — LORAZEPAM 1 MG PO TABS: 0.0000 mg | ORAL_TABLET | Freq: Two times a day (BID) | ORAL | Status: DC

## 2019-01-29 MED ORDER — SENNA 8.6 MG PO TABS
1.0000 | ORAL_TABLET | Freq: Two times a day (BID) | ORAL | Status: DC
  Administered 2019-01-29 – 2019-02-02 (×7): 8.6 mg via ORAL
  Filled 2019-01-29 (×8): qty 1

## 2019-01-29 MED ORDER — SENNOSIDES-DOCUSATE SODIUM 8.6-50 MG PO TABS: 1.0000 | ORAL_TABLET | Freq: Every evening | ORAL | Status: DC | PRN

## 2019-01-29 MED ORDER — BISACODYL 10 MG RE SUPP: 10.0000 mg | Freq: Every day | RECTAL | Status: DC | PRN

## 2019-01-29 MED ORDER — ALBUTEROL SULFATE HFA 108 (90 BASE) MCG/ACT IN AERS
2.0000 | INHALATION_SPRAY | Freq: Four times a day (QID) | RESPIRATORY_TRACT | Status: DC
  Administered 2019-01-29 (×2): 2 via RESPIRATORY_TRACT
  Filled 2019-01-29: qty 6.7

## 2019-01-29 MED ORDER — ATORVASTATIN CALCIUM 10 MG PO TABS
20.0000 mg | ORAL_TABLET | Freq: Every day | ORAL | Status: DC
  Administered 2019-01-29 – 2019-02-02 (×4): 20 mg via ORAL
  Filled 2019-01-29 (×4): qty 2

## 2019-01-29 MED ORDER — FOLIC ACID 1 MG PO TABS
1.0000 mg | ORAL_TABLET | Freq: Every day | ORAL | Status: DC
  Administered 2019-01-29: 17:00:00 1 mg via ORAL

## 2019-01-29 NOTE — Progress Notes (Signed)
1745 Received pt from ED via stretcher. A&O x4, assisted to bed. LLE externally rotated and shorter.

## 2019-01-29 NOTE — H&P (View-Only) (Signed)
Reason for Consult:For report left hip intertrochanteric fracture impacted and mildly displaced Referring Physician: Pattricia Boss MD  Beth Daniels is an 73 y.o. female.  HPI: 73 year old female who slipped and fell going to the bathroom last evening sustained a contusion to her left elbow with no fracture fell onto her left hip and sustained an impacted mildly displaced 4 part intertrochanteric fracture of the left hip.  Transported to the emergency room at [facility] and evaluated by the ER physician orthopedic consultation was obtained.  Patient denies any loss of consciousness.  Comorbidities include COPD on home oxygen at night, emphysema, peripheral vascular disease with stent in the left femoral artery.  History of tobacco use stopped smoking in 2018.  Past history of alcohol abuse.  Past Medical History:  Diagnosis Date  . Alcohol abuse   . Cancer [facility])    breast right  . Chronic hyponatremia   . COPD (chronic obstructive pulmonary disease) ([place])   . DCIS (ductal carcinoma in situ)   . Emphysema of lung ([place])   . Osteoporosis   . Overactive bladder   . PVD (peripheral vascular disease) ([place])   . Tobacco use     Past Surgical History:  Procedure Laterality Date  . aeptoplasty    . cartaract extraction    . IR FIBRIN GLUE REPAIR ANAL FISTULA    . MASTECTOMY     s/p bl  . RETINAL DETACHMENT SURGERY    . sclerotherapy vein leg    . vitreous retinal surgery      Family History  Problem Relation Age of Onset  . Alzheimer's disease Mother   . Pneumonia Father        aspiration pneumonia    Social History:  reports that she quit smoking about 2 years ago. She started smoking about 50 years ago. She has a 75.00 pack-year smoking history. She has never used smokeless tobacco. She reports current alcohol use of about 28.0 standard drinks of alcohol per week. She reports previous drug use.  Allergies: No Known Allergies  Medications: I have reviewed the patient's current  medications.  Results for orders placed or performed during the hospital encounter of 01/29/19 (from the past 48 hour(s))  Basic metabolic panel     Status: Abnormal   Collection Time: 01/29/19  1:54 PM  Result Value Ref Range   Sodium 132 (L) 135 - 145 mmol/L   Potassium 4.1 3.5 - 5.1 mmol/L   Chloride 96 (L) 98 - 111 mmol/L   CO2 21 (L) 22 - 32 mmol/L   Glucose, Bld 72 70 - 99 mg/dL   BUN <5 (L) 8 - 23 mg/dL   Creatinine, Ser 0.36 (L) 0.44 - 1.00 mg/dL   Calcium 8.4 (L) 8.9 - 10.3 mg/dL   GFR calc non Af Amer >60 >60 mL/min   GFR calc Af Amer >60 >60 mL/min   Anion gap 15 5 - 15    Comment: Performed at [facility], [address] [place], [place] [address]  CBC WITH DIFFERENTIAL     Status: Abnormal   Collection Time: 01/29/19  1:54 PM  Result Value Ref Range   WBC 4.6 4.0 - 10.5 K/uL   RBC 3.33 (L) 3.87 - 5.11 MIL/uL   Hemoglobin 11.6 (L) 12.0 - 15.0 g/dL   HCT 34.7 (L) 36.0 - 46.0 %   MCV 104.2 (H) 80.0 - 100.0 fL   MCH 34.8 (H) 26.0 - 34.0 pg   MCHC 33.4 30.0 -  36.0 g/dL   RDW 15.2 11.5 - 15.5 %   Platelets 279 150 - 400 K/uL   nRBC 0.0 0.0 - 0.2 %   Neutrophils Relative % 77 %   Neutro Abs 3.5 1.7 - 7.7 K/uL   Lymphocytes Relative 17 %   Lymphs Abs 0.8 0.7 - 4.0 K/uL   Monocytes Relative 6 %   Monocytes Absolute 0.3 0.1 - 1.0 K/uL   Eosinophils Relative 0 %   Eosinophils Absolute 0.0 0.0 - 0.5 K/uL   Basophils Relative 0 %   Basophils Absolute 0.0 0.0 - 0.1 K/uL   Immature Granulocytes 0 %   Abs Immature Granulocytes 0.02 0.00 - 0.07 K/uL    Comment: Performed at [facility] [address] [place], [place] [address]  Protime-INR     Status: None   Collection Time: 01/29/19  1:54 PM  Result Value Ref Range   Prothrombin Time 12.6 11.4 - 15.2 seconds   INR 1.0 0.8 - 1.2    Comment: (NOTE) INR goal varies based on device and disease states. Performed at [facility], [place] [address] [place], [place] [address]   Type and screen [place]     Status: None   Collection Time: 01/29/19  1:54 PM  Result Value Ref Range   ABO/RH(D) O POS    Antibody Screen NEG    Sample Expiration      02/01/2019,2359 Performed at [facility], [place] [address], [place], [place] [address]   ABO/Rh     Status: None   Collection Time: 01/29/19  1:54 PM  Result Value Ref Range   ABO/RH(D)      O POS Performed at [facility] [address] [place], [place] [address]   SARS Coronavirus 2 (CEPHEID - Performed in [facility] lab), Hosp Order     Status: None   Collection Time: 01/29/19  3:20 PM   Specimen: Nasopharyngeal Swab  Result Value Ref Range   SARS Coronavirus 2 NEGATIVE NEGATIVE    Comment: (NOTE) If result is NEGATIVE SARS-CoV-2 target nucleic acids are NOT DETECTED. The SARS-CoV-2 RNA is generally detectable in upper and lower  respiratory specimens during the acute phase of infection. The lowest  concentration of SARS-CoV-2 viral copies this assay can detect is 250  copies / mL. A negative result does not preclude SARS-CoV-2 infection  and should not be used as the sole basis for treatment or other  patient management decisions.  A negative result may occur with  improper specimen collection / handling, submission of specimen other  than nasopharyngeal swab, presence of viral mutation(s) within the  areas targeted by this assay, and inadequate number of viral copies  (<250 copies / mL). A negative result must be combined with clinical  observations, patient history, and epidemiological information. If result is POSITIVE SARS-CoV-2 target nucleic acids are DETECTED. The SARS-CoV-2 RNA is generally detectable in upper and lower  respiratory specimens dur ing the acute phase of infection.  Positive  results are indicative of active infection with SARS-CoV-2.  Clinical  correlation with patient history and other diagnostic information is  necessary to determine patient infection  status.  Positive results do  not rule out bacterial infection or co-infection with other viruses. If result is PRESUMPTIVE POSTIVE SARS-CoV-2 nucleic acids MAY BE PRESENT.   A presumptive positive result was obtained on the submitted specimen  and confirmed on repeat testing.  While 2019  novel coronavirus  (SARS-CoV-2) nucleic acids may be present in the submitted sample  additional confirmatory testing may be necessary for epidemiological  and / or clinical management purposes  to differentiate between  SARS-CoV-2 and other Sarbecovirus currently known to infect humans.  If clinically indicated additional testing with an alternate test  methodology [phone]) is advised. The SARS-CoV-2 RNA is generally  detectable in upper and lower respiratory sp ecimens during the acute  phase of infection. The expected result is Negative. Fact Sheet for Patients:  StrictlyIdeas.no Fact Sheet for Healthcare Providers: BankingDealers.co.za This test is not yet approved or cleared by the [place] FDA and has been authorized for detection and/or diagnosis of SARS-CoV-2 by FDA under an Emergency Use Authorization (EUA).  This EUA will remain in effect (meaning this test can be used) for the duration of the COVID-19 declaration under Section 564(b)(1) of the Act, 21 U.S.C. section 360bbb-3(b)(1), unless the authorization is terminated or revoked sooner. Performed at [facility], [place] [address] [place], [place] [address]     Dg Chest 1 View  Result Date: 01/29/2019 CLINICAL DATA:  Left hip fracture.  Pre-op respiratory exam EXAM: CHEST  1 VIEW COMPARISON:  01/27/2019 FINDINGS: Heart size is normal. Aortic atherosclerosis. Pulmonary hyperinflation again seen, consistent with COPD. Both lungs are clear. IMPRESSION: COPD. No active disease. Electronically Signed   By: Marlaine Hind M.D.   On: 01/29/2019 14:58   Dg Elbow 2 Views Left  Result  Date: 01/29/2019 CLINICAL DATA:  Fall today.  Left elbow pain.  Initial encounter. EXAM: LEFT ELBOW - 2 VIEW COMPARISON:  None. FINDINGS: There is no evidence of fracture, dislocation, or joint effusion. There is no evidence of arthropathy or other focal bone abnormality. Soft tissues are unremarkable. IMPRESSION: Negative. Electronically Signed   By: Marlaine Hind M.D.   On: 01/29/2019 14:59   Ct Hip Left Wo Contrast  Result Date: 01/29/2019 CLINICAL DATA:  Left proximal femur fracture. Left hip pain secondary to a fall. EXAM: CT OF THE LEFT HIP WITHOUT CONTRAST TECHNIQUE: Multidetector CT imaging of the left hip was performed according to the standard protocol. Multiplanar CT image reconstructions were also generated. COMPARISON:  Radiographs dated 01/29/2019 FINDINGS: Bones/Joint/Cartilage There is a comminuted intertrochanteric fracture of the proximal left femur with angulation and impaction. A large portion of the greater trochanter is avulsed and slightly displaced. There is no dislocation at the hip. Mild arthritic changes of the hip joint with joint space narrowing and small marginal osteophytes on the left femoral head. Visualized pelvic bones are intact. Soft tissues There is hemorrhage in the subcutaneous fat lateral to the left hip as well as some hemorrhage into the adjacent musculature around the hip. IMPRESSION: Comminuted intertrochanteric fracture of the proximal left femur as described. Electronically Signed   By: Lorriane Shire M.D.   On: 01/29/2019 17:16   Dg Hip Unilat With Pelvis 2-3 Views Left  Result Date: 01/29/2019 CLINICAL DATA:  Fall today. Left hip pain.  Initial encounter. EXAM: DG HIP (WITH OR WITHOUT PELVIS) 2-3V LEFT COMPARISON:  None. FINDINGS: Mildly impacted and displaced intertrochanteric left hip fracture is seen. No evidence of hip dislocation. Mild left hip osteoarthritis noted. IMPRESSION: Intertrochanteric left hip fracture. Electronically Signed   By: Marlaine Hind  M.D.   On: 01/29/2019 14:57    ROS Blood pressure 120/68, pulse 97, temperature 98.3 F (36.8 C), resp. rate 18, SpO2 100 %.  Again patient denies any loss of consciousness or shortness  of breath.  Physical Exam: Patient is alert and oriented 3 resting comfortably.  There is bruising over the left elbow with an abrasion that has had a Band-Aid applied.  Full range of motion of the left elbow.  There is also bruising over the greater trochanteric region of the left hip and it house of moving the left hip cause significant pain.  Left lower extremity is shortened by 2 cm with minimal external rotation.  Toes are pink and well perfused.  Sensation is grossly intact.  Contralateral right hip is a full range of motion as do the knees and ankles.  Assessment/Plan: 73 year old female with significant comorbidities with an impacted slightly displaced 4 part left hip intertrochanteric fracture.  Risks and benefits of surgical stabilization were discussed at length with the patient.  She is scheduled for closed intramedullary nailing using a Zimmer [employer] of 6 this nail-lockedtomorrow morning. Surgical be done in the supine position on a HANA table for improved pulmonary function.  Beth Daniels 01/29/2019, 8:03 PM

## 2019-01-29 NOTE — ED Notes (Signed)
Patient transported to CT 

## 2019-01-29 NOTE — Progress Notes (Signed)
Patient ID: Beth Daniels, female   DOB: [birth year], 73 y.o.   MRN: [id number]  Rozlyn slipped in bathroom and fell. She was diagnosed with a left intertroch hip fx. Will plan IMN in AM with Dr. Griffin Basil as long as medicine clears her.  Full consult note to follow.    Lisette Abu, PA-C Orthopedic Surgery [phone]

## 2019-01-29 NOTE — ED Notes (Signed)
Pt husband Timmothy Sours [phone]

## 2019-01-29 NOTE — Consult Note (Signed)
Reason for Consult:For report left hip intertrochanteric fracture impacted and mildly displaced Referring Physician: Pattricia Boss MD  Beth Daniels is an 73 y.o. female.  HPI: 73 year old female who slipped and fell going to the bathroom last evening sustained a contusion to her left elbow with no fracture fell onto her left hip and sustained an impacted mildly displaced 4 part intertrochanteric fracture of the left hip.  Transported to the emergency room at [facility] and evaluated by the ER physician orthopedic consultation was obtained.  Patient denies any loss of consciousness.  Comorbidities include COPD on home oxygen at night, emphysema, peripheral vascular disease with stent in the left femoral artery.  History of tobacco use stopped smoking in 2018.  Past history of alcohol abuse.  Past Medical History:  Diagnosis Date  . Alcohol abuse   . Cancer [facility])    breast right  . Chronic hyponatremia   . COPD (chronic obstructive pulmonary disease) ([place])   . DCIS (ductal carcinoma in situ)   . Emphysema of lung ([place])   . Osteoporosis   . Overactive bladder   . PVD (peripheral vascular disease) ([place])   . Tobacco use     Past Surgical History:  Procedure Laterality Date  . aeptoplasty    . cartaract extraction    . IR FIBRIN GLUE REPAIR ANAL FISTULA    . MASTECTOMY     s/p bl  . RETINAL DETACHMENT SURGERY    . sclerotherapy vein leg    . vitreous retinal surgery      Family History  Problem Relation Age of Onset  . Alzheimer's disease Mother   . Pneumonia Father        aspiration pneumonia    Social History:  reports that she quit smoking about 2 years ago. She started smoking about 50 years ago. She has a 75.00 pack-year smoking history. She has never used smokeless tobacco. She reports current alcohol use of about 28.0 standard drinks of alcohol per week. She reports previous drug use.  Allergies: No Known Allergies  Medications: I have reviewed the patient's current  medications.  Results for orders placed or performed during the hospital encounter of 01/29/19 (from the past 48 hour(s))  Basic metabolic panel     Status: Abnormal   Collection Time: 01/29/19  1:54 PM  Result Value Ref Range   Sodium 132 (L) 135 - 145 mmol/L   Potassium 4.1 3.5 - 5.1 mmol/L   Chloride 96 (L) 98 - 111 mmol/L   CO2 21 (L) 22 - 32 mmol/L   Glucose, Bld 72 70 - 99 mg/dL   BUN <5 (L) 8 - 23 mg/dL   Creatinine, Ser 0.36 (L) 0.44 - 1.00 mg/dL   Calcium 8.4 (L) 8.9 - 10.3 mg/dL   GFR calc non Af Amer >60 >60 mL/min   GFR calc Af Amer >60 >60 mL/min   Anion gap 15 5 - 15    Comment: Performed at [facility], [address] [place], [place] [address]  CBC WITH DIFFERENTIAL     Status: Abnormal   Collection Time: 01/29/19  1:54 PM  Result Value Ref Range   WBC 4.6 4.0 - 10.5 K/uL   RBC 3.33 (L) 3.87 - 5.11 MIL/uL   Hemoglobin 11.6 (L) 12.0 - 15.0 g/dL   HCT 34.7 (L) 36.0 - 46.0 %   MCV 104.2 (H) 80.0 - 100.0 fL   MCH 34.8 (H) 26.0 - 34.0 pg   MCHC 33.4 30.0 -  36.0 g/dL   RDW 15.2 11.5 - 15.5 %   Platelets 279 150 - 400 K/uL   nRBC 0.0 0.0 - 0.2 %   Neutrophils Relative % 77 %   Neutro Abs 3.5 1.7 - 7.7 K/uL   Lymphocytes Relative 17 %   Lymphs Abs 0.8 0.7 - 4.0 K/uL   Monocytes Relative 6 %   Monocytes Absolute 0.3 0.1 - 1.0 K/uL   Eosinophils Relative 0 %   Eosinophils Absolute 0.0 0.0 - 0.5 K/uL   Basophils Relative 0 %   Basophils Absolute 0.0 0.0 - 0.1 K/uL   Immature Granulocytes 0 %   Abs Immature Granulocytes 0.02 0.00 - 0.07 K/uL    Comment: Performed at [facility] [address] [place], [place] [address]  Protime-INR     Status: None   Collection Time: 01/29/19  1:54 PM  Result Value Ref Range   Prothrombin Time 12.6 11.4 - 15.2 seconds   INR 1.0 0.8 - 1.2    Comment: (NOTE) INR goal varies based on device and disease states. Performed at [facility], [place] [address], [place], [place] [address]   Type and screen [place]     Status: None   Collection Time: 01/29/19  1:54 PM  Result Value Ref Range   ABO/RH(D) O POS    Antibody Screen NEG    Sample Expiration      02/01/2019,2359 Performed at [facility], [place] [address], [place], [place] [address]   ABO/Rh     Status: None   Collection Time: 01/29/19  1:54 PM  Result Value Ref Range   ABO/RH(D)      O POS Performed at [facility] [address], [place], [place] [address]   SARS Coronavirus 2 (CEPHEID - Performed in [facility] hospital lab), Hosp Order     Status: None   Collection Time: 01/29/19  3:20 PM   Specimen: Nasopharyngeal Swab  Result Value Ref Range   SARS Coronavirus 2 NEGATIVE NEGATIVE    Comment: (NOTE) If result is NEGATIVE SARS-CoV-2 target nucleic acids are NOT DETECTED. The SARS-CoV-2 RNA is generally detectable in upper and lower  respiratory specimens during the acute phase of infection. The lowest  concentration of SARS-CoV-2 viral copies this assay can detect is 250  copies / mL. A negative result does not preclude SARS-CoV-2 infection  and should not be used as the sole basis for treatment or other  patient management decisions.  A negative result may occur with  improper specimen collection / handling, submission of specimen other  than nasopharyngeal swab, presence of viral mutation(s) within the  areas targeted by this assay, and inadequate number of viral copies  (<250 copies / mL). A negative result must be combined with clinical  observations, patient history, and epidemiological information. If result is POSITIVE SARS-CoV-2 target nucleic acids are DETECTED. The SARS-CoV-2 RNA is generally detectable in upper and lower  respiratory specimens dur ing the acute phase of infection.  Positive  results are indicative of active infection with SARS-CoV-2.  Clinical  correlation with patient history and other diagnostic information is  necessary to determine patient infection  status.  Positive results do  not rule out bacterial infection or co-infection with other viruses. If result is PRESUMPTIVE POSTIVE SARS-CoV-2 nucleic acids MAY BE PRESENT.   A presumptive positive result was obtained on the submitted specimen  and confirmed on repeat testing.  While 2019  novel coronavirus  (SARS-CoV-2) nucleic acids may be present in the submitted sample  additional confirmatory testing may be necessary for epidemiological  and / or clinical management purposes  to differentiate between  SARS-CoV-2 and other Sarbecovirus currently known to infect humans.  If clinically indicated additional testing with an alternate test  methodology [phone]) is advised. The SARS-CoV-2 RNA is generally  detectable in upper and lower respiratory sp ecimens during the acute  phase of infection. The expected result is Negative. Fact Sheet for Patients:  StrictlyIdeas.no Fact Sheet for Healthcare Providers: BankingDealers.co.za This test is not yet approved or cleared by the [place] FDA and has been authorized for detection and/or diagnosis of SARS-CoV-2 by FDA under an Emergency Use Authorization (EUA).  This EUA will remain in effect (meaning this test can be used) for the duration of the COVID-19 declaration under Section 564(b)(1) of the Act, 21 U.S.C. section 360bbb-3(b)(1), unless the authorization is terminated or revoked sooner. Performed at [facility], [place] [address], [place], [place] [address]     Dg Chest 1 View  Result Date: 01/29/2019 CLINICAL DATA:  Left hip fracture.  Pre-op respiratory exam EXAM: CHEST  1 VIEW COMPARISON:  01/27/2019 FINDINGS: Heart size is normal. Aortic atherosclerosis. Pulmonary hyperinflation again seen, consistent with COPD. Both lungs are clear. IMPRESSION: COPD. No active disease. Electronically Signed   By: Marlaine Hind M.D.   On: 01/29/2019 14:58   Dg Elbow 2 Views Left  Result  Date: 01/29/2019 CLINICAL DATA:  Fall today.  Left elbow pain.  Initial encounter. EXAM: LEFT ELBOW - 2 VIEW COMPARISON:  None. FINDINGS: There is no evidence of fracture, dislocation, or joint effusion. There is no evidence of arthropathy or other focal bone abnormality. Soft tissues are unremarkable. IMPRESSION: Negative. Electronically Signed   By: Marlaine Hind M.D.   On: 01/29/2019 14:59   Ct Hip Left Wo Contrast  Result Date: 01/29/2019 CLINICAL DATA:  Left proximal femur fracture. Left hip pain secondary to a fall. EXAM: CT OF THE LEFT HIP WITHOUT CONTRAST TECHNIQUE: Multidetector CT imaging of the left hip was performed according to the standard protocol. Multiplanar CT image reconstructions were also generated. COMPARISON:  Radiographs dated 01/29/2019 FINDINGS: Bones/Joint/Cartilage There is a comminuted intertrochanteric fracture of the proximal left femur with angulation and impaction. A large portion of the greater trochanter is avulsed and slightly displaced. There is no dislocation at the hip. Mild arthritic changes of the hip joint with joint space narrowing and small marginal osteophytes on the left femoral head. Visualized pelvic bones are intact. Soft tissues There is hemorrhage in the subcutaneous fat lateral to the left hip as well as some hemorrhage into the adjacent musculature around the hip. IMPRESSION: Comminuted intertrochanteric fracture of the proximal left femur as described. Electronically Signed   By: Lorriane Shire M.D.   On: 01/29/2019 17:16   Dg Hip Unilat With Pelvis 2-3 Views Left  Result Date: 01/29/2019 CLINICAL DATA:  Fall today. Left hip pain.  Initial encounter. EXAM: DG HIP (WITH OR WITHOUT PELVIS) 2-3V LEFT COMPARISON:  None. FINDINGS: Mildly impacted and displaced intertrochanteric left hip fracture is seen. No evidence of hip dislocation. Mild left hip osteoarthritis noted. IMPRESSION: Intertrochanteric left hip fracture. Electronically Signed   By: Marlaine Hind  M.D.   On: 01/29/2019 14:57    ROS Blood pressure 120/68, pulse 97, temperature 98.3 F (36.8 C), resp. rate 18, SpO2 100 %.  Again patient denies any loss of consciousness or shortness  of breath.  Physical Exam: Patient is alert and oriented 3 resting comfortably.  There is bruising over the left elbow with an abrasion that has had a Band-Aid applied.  Full range of motion of the left elbow.  There is also bruising over the greater trochanteric region of the left hip and it house of moving the left hip cause significant pain.  Left lower extremity is shortened by 2 cm with minimal external rotation.  Toes are pink and well perfused.  Sensation is grossly intact.  Contralateral right hip is a full range of motion as do the knees and ankles.  Assessment/Plan: 73 year old female with significant comorbidities with an impacted slightly displaced 4 part left hip intertrochanteric fracture.  Risks and benefits of surgical stabilization were discussed at length with the patient.  She is scheduled for closed intramedullary nailing using a [employer]immer [employer] of 6 this nail-lockedtomorrow morning. Surgical be done in the supine position on a HANA table for improved pulmonary function.  Kerin Salen 01/29/2019, 8:03 PM

## 2019-01-29 NOTE — Plan of Care (Signed)

## 2019-01-29 NOTE — H&P (Signed)
TRH H&P   Patient Demographics:    Beth Daniels, is a 73 y.o. female  MRN: [id number]   DOB - October 14, 1945  Admit Date - 01/29/2019  Outpatient Primary MD for the patient is Shon Baton, MD  Referring MD/NP/PA: Dr Jeanell Sparrow  Patient coming from: Home  Chief Complaint  Patient presents with  . Fall  . Hip Pain      HPI:    Beth Daniels  is a 73 y.o. female, with past medical history of osteoporosis, breast cancer status post mastectomy, chronic hyponatremia, COPD, chronic respiratory failure on 2 L nasal cannula, alcohol abuse, alcohol induced neuropathy, PVD status post a stent placement 3 years ago at [facility] in left lower extremity, patient presents secondary to mechanical fall, reports at baseline she has poor gait secondary to her alcohol induced neuropathy, as well she was wearing hair compression stocking as well, reports she had a mechanical fall, with left hip and elbow pain, denies any head trauma, no loss of consciousness, unable to get up even with husband assistance secondary to severe left hip pain, he denies chest pain, reports dyspnea at baseline, denies fever, dysuria, polyuria, reports she recently moved back to [place] before 3 weeks. -In ED work-up was significant for left hip fracture, hyponatremia with sodium of 132, I was called to admit for further treatment.    Review of systems:    In addition to the HPI above,  No Fever-chills, No Headache, No changes with Vision or hearing, No problems swallowing food or Liquids, No Chest pain, Cough or Shortness of Breath, No Abdominal pain, No Nausea or Vommitting, Bowel movements are regular, No Blood in stool or Urine, No dysuria, No new skin rashes or bruises, Complains of fall and left hip pain No new weakness, tingling, numbness in any extremity, No recent weight gain or loss, No polyuria, polydypsia or polyphagia, No  significant Mental Stressors.  A full 10 point Review of Systems was done, except as stated above, all other Review of Systems were negative.   With Past History of the following :    Past Medical History:  Diagnosis Date  . Cancer [facility])    breast right  . Chronic hyponatremia   . COPD (chronic obstructive pulmonary disease) ([place])   . DCIS (ductal carcinoma in situ)   . Emphysema of lung ([place])   . Osteoporosis   . Overactive bladder   . PVD (peripheral vascular disease) ([place])   . Tobacco use       Past Surgical History:  Procedure Laterality Date  . aeptoplasty    . cartaract extraction    . IR FIBRIN GLUE REPAIR ANAL FISTULA    . MASTECTOMY     s/p bl  . RETINAL DETACHMENT SURGERY    . sclerotherapy vein leg    . vitreous retinal surgery        Social History:  Social History   Tobacco Use  . Smoking status: Former Smoker    Packs/day: 1.50    Years: 50.00    Pack years: 75.00    Start date: 1970    Quit date: 2018    Years since quitting: 2.5  . Smokeless tobacco: Never Used  Substance Use Topics  . Alcohol use: Yes     Lives -at home with her husband  Mobility -with assistant, limited mobility     Family History :     Family History  Problem Relation Age of Onset  . Alzheimer's disease Mother   . Pneumonia Father        aspiration pneumonia      Home Medications:   Prior to Admission medications   Medication Sig Start Date End Date Taking? Authorizing Provider  albuterol (VENTOLIN HFA) 108 (90 Base) MCG/ACT inhaler Inhale into the lungs. 09/29/18   [provider]  atorvastatin (LIPITOR) 20 MG tablet Take by mouth daily. 02/15/18 02/15/19  [provider]  azelastine (ASTELIN) 0.1 % nasal spray USE 1 SPRAY IN EACH NOSTRIL TWICE A DAY 09/02/18   [provider]  Calcium Carbonate-Vitamin D (CALCIUM 500 + D) 500-125 MG-UNIT TABS Take by mouth daily.    [provider]  camphor-menthol Timoteo Ace) lotion  Apply topically. 02/07/18 02/07/19  [provider]  clopidogrel (PLAVIX) 75 MG tablet Take by mouth. 08/16/18 08/16/19  [provider]  conjugated estrogens (PREMARIN) vaginal cream Place vaginally. 12/20/18   [provider]  Cranberry 400 MG CAPS Take by mouth.    [provider]  denosumab (PROLIA) 60 MG/ML SOSY injection Inject into the skin.    [provider]  Ferrous Fumarate (HEMOCYTE - 106 MG FE) 324 (106 Fe) MG TABS tablet Take by mouth. 02/09/18   [provider]  folic acid (FOLVITE) 1 MG tablet Take by mouth. 03/02/18 03/02/19  [provider]  gabapentin (NEURONTIN) 100 MG capsule Take 100 mg by mouth daily.    [provider]  gabapentin (NEURONTIN) 300 MG capsule Take by mouth. 04/13/18   [provider]  guaiFENesin (MUCINEX) 600 MG 12 hr tablet Take by mouth.    [provider]  magnesium oxide (MAG-OX) 400 MG tablet Take by mouth. 06/02/18 06/02/19  [provider]  Multiple Vitamin (MULTI-VITAMIN) tablet Take by mouth.    [provider]  mupirocin ointment (BACTROBAN) 2 % Apply topically. 08/28/16   [provider]  nicotine polacrilex (COMMIT) 4 MG lozenge Start 4 weeks prior to Quit Day.  Place 1 lozenge between cheek and gum every 15 minutes for urges 09/10/16   [provider]  OXYGEN 2 liters at hs    [provider]  pantoprazole (PROTONIX) 40 MG tablet TAKE 1 TABLET DAILY 12/01/18   [provider]  umeclidinium-vilanterol (ANORO ELLIPTA) 62.5-25 MCG/INH AEPB Inhale into the lungs. 12/19/16   [provider]     Allergies:    No Known Allergies   Physical Exam:   Vitals  Blood pressure 127/71, pulse 72, temperature 98.3 F (36.8 C), resp. rate 19, SpO2 91 %.   1. General thin appearing female, chronic ill-appearing, laying in bed in no apparent distress  2. Normal affect and insight, Not Suicidal or Homicidal, Awake  Alert, Oriented X 3.  3. No F.N deficits, ALL C.Nerves Intact, Strength 5/5 all 4 extremities, Sensation intact all 4 extremities, Plantars down going.  4. Ears and Eyes appear Normal, Conjunctivae clear, PERRLA.  Moist Oral Mucosa.  5. Supple Neck, No JVD, No cervical lymphadenopathy appriciated, No Carotid Bruits.  6. Symmetrical Chest wall movement, Good air movement bilaterally, CTAB.  7. RRR, No Gallops, Rubs or Murmurs, No Parasternal Heave.  8. Positive Bowel Sounds, Abdomen Soft, No tenderness, No organomegaly appriciated,No rebound -guarding or rigidity.  9.  No Cyanosis, Normal Skin Turgor, No Skin Rash or Bruise.  10. Good muscle tone,  joints appear normal , no effusions, left lower extremity range of motion limited secondary to pain, left lower extremity externally rotated and shortened  11. No Palpable Lymph Nodes in Neck or Axillae     Data Review:    CBC Recent Labs  Lab 01/29/19 1354  WBC 4.6  HGB 11.6*  HCT 34.7*  PLT 279  MCV 104.2*  MCH 34.8*  MCHC 33.4  RDW 15.2  LYMPHSABS 0.8  MONOABS 0.3  EOSABS 0.0  BASOSABS 0.0   ------------------------------------------------------------------------------------------------------------------  Chemistries  Recent Labs  Lab 01/29/19 1354  NA 132*  K 4.1  CL 96*  CO2 21*  GLUCOSE 72  BUN <5*  CREATININE 0.36*  CALCIUM 8.4*   ------------------------------------------------------------------------------------------------------------------ estimated creatinine clearance is 40.5 mL/min (A) (by C-G formula based on SCr of 0.36 mg/dL (L)). ------------------------------------------------------------------------------------------------------------------ No results for input(s): TSH, T4TOTAL, T3FREE, THYROIDAB in the last 72 hours.  Invalid input(s): FREET3  Coagulation profile Recent Labs  Lab 01/29/19 1354  INR 1.0    ------------------------------------------------------------------------------------------------------------------- No results for input(s): DDIMER in the last 72 hours. -------------------------------------------------------------------------------------------------------------------  Cardiac Enzymes No results for input(s): CKMB, TROPONINI, MYOGLOBIN in the last 168 hours.  Invalid input(s): CK ------------------------------------------------------------------------------------------------------------------ No results found for: BNP   ---------------------------------------------------------------------------------------------------------------  Urinalysis No results found for: COLORURINE, APPEARANCEUR, [place], [place], [place], [place], BILIRUBINUR, KETONESUR, PROTEINUR, UROBILINOGEN, NITRITE, LEUKOCYTESUR  ----------------------------------------------------------------------------------------------------------------   Imaging Results:    Dg Chest 1 View  Result Date: 01/29/2019 CLINICAL DATA:  Left hip fracture.  Pre-op respiratory exam EXAM: CHEST  1 VIEW COMPARISON:  01/27/2019 FINDINGS: Heart size is normal. Aortic atherosclerosis. Pulmonary hyperinflation again seen, consistent with COPD. Both lungs are clear. IMPRESSION: COPD. No active disease. Electronically Signed   By: Marlaine Hind M.D.   On: 01/29/2019 14:58   Dg Elbow 2 Views Left  Result Date: 01/29/2019 CLINICAL DATA:  Fall today.  Left elbow pain.  Initial encounter. EXAM: LEFT ELBOW - 2 VIEW COMPARISON:  None. FINDINGS: There is no evidence of fracture, dislocation, or joint effusion. There is no evidence of arthropathy or other focal bone abnormality. Soft tissues are unremarkable. IMPRESSION: Negative. Electronically Signed   By: Marlaine Hind M.D.   On: 01/29/2019 14:59   Dg Hip Unilat With Pelvis 2-3 Views Left  Result Date: 01/29/2019 CLINICAL DATA:  Fall today. Left hip pain.  Initial encounter. EXAM: DG HIP  (WITH OR WITHOUT PELVIS) 2-3V LEFT COMPARISON:  None. FINDINGS: Mildly impacted and displaced intertrochanteric left hip fracture is seen. No evidence of hip dislocation. Mild left hip osteoarthritis noted. IMPRESSION: Intertrochanteric left hip fracture. Electronically Signed   By: Marlaine Hind M.D.   On: 01/29/2019 14:57    My personal review of EKG: Pending   Assessment & Plan:    Active Problems:   COPD (chronic obstructive pulmonary disease) (HCC)   PVD (peripheral vascular disease) (HCC)   Alcohol dependence ([place])   Closed left hip fracture (HCC)   Left Hip fracture -This is secondary to mechanical fall, with known osteoporosis -Patient with known history of COPD, on 2 L nasal cannula  at baseline, try to minimize rest, but recommending spinal block if available, and minimize intubation time if needed, she will be encouraged use incentive spirometry, will keep on scheduled albuterol. -Cardiac perioperative risk is 0.9 on GSCRI , no further work-up indicated before surgery. -I have discussed with patient and explained for her overall she is moderate risk for surgery given her malnutrition, alcohol abuse, known history of COPD with chronic respiratory failure on 2 L nasal cannula, but overall she is optimized, will obtain EKG, if no acute findings, then  no further work-up is indicated before surgery.   -DVT prophylaxis, and management per orthopedic team.  History of PVD -With stent placement 3 years ago in left lower extremity, will hold Plavix for now.  Hyperlipidemia -Continue with statin  History of COPD/chronic hypoxic respiratory failure -Patient currently with no wheezing, at baseline on 2 L nasal cannula -We will encourage to use incentive spirometry frequently -Resume home meds including scheduled albuterol, and Anoro Ellipta  Alcohol induced neuropathy -Continue with gabapentin  Alcohol dependence -We will start to see RWA protocol  Protein calorie malnutrition  -Nutritionist consulted  Hyponatremia -Chronic, at baseline  Macrocytic anemia -Continue to alcohol abuse, recent normal B12 and folate level  Osteoporosis -Receives Prolia as an outpatient  Hyperlipidemia -Continue with statin   DVT Prophylaxis SCDs  AM Labs Ordered, also please review Full Orders  Family Communication: Admission, patients condition and plan of care including tests being ordered have been discussed with the patientwho indicate understanding and agree with the plan and Code Status.  Code Status full  Likely DC to likely SNF  Condition GUARDED    Consults called: Ortho By ED  Admission status: inpatient  Time spent in minutes : 60 minutes   Phillips Climes M.D on 01/29/2019 at 4:02 PM  Between 7am to 7pm - Pager - [phone]. After 7pm go to [email or web address] - password [facility]  [facility] - Office  [phone]

## 2019-01-29 NOTE — ED Triage Notes (Signed)
Pt arrives from home via EMS for mechanical fall, states she fell of of toilet, did not hit head. Pt has pain to left hip and elbow.

## 2019-01-29 NOTE — ED Provider Notes (Signed)
[place] EMERGENCY DEPARTMENT Provider Note   CSN: [id number] Arrival date & time: 01/29/19  1230     History   Chief Complaint Chief Complaint  Patient presents with  . Fall  . Hip Pain    HPI Beth Daniels is a 73 y.o. female.     HPI  73 yo female ho copd oxygen at night, ho breast ca, prior smoker, osteoporosis, presents today with fall and hip and elbow pain. States she wasin bathroom and slipped getting on to commode.  Hit left elbow and landed on left hip.  Unable to get up even with husband's assistance due to severe painin left hip.  Denies head injury or loss of consciousness.  She states she has some ongoing pain in her back since fall a couple weeks ago.  This occurred in the same bathroom.  She was seen in urgent care for that.  Past Medical History:  Diagnosis Date  . Cancer [facility])    breast right  . Chronic hyponatremia   . COPD (chronic obstructive pulmonary disease) ([place])   . DCIS (ductal carcinoma in situ)   . Emphysema of lung ([place])   . Osteoporosis   . Overactive bladder   . PVD (peripheral vascular disease) ([place])   . Tobacco use     Patient Active Problem List   Diagnosis Date Noted  . Alcoholic peripheral neuropathy ([place]) 01/24/2019    Past Surgical History:  Procedure Laterality Date  . aeptoplasty    . cartaract extraction    . IR FIBRIN GLUE REPAIR ANAL FISTULA    . MASTECTOMY     s/p bl  . RETINAL DETACHMENT SURGERY    . sclerotherapy vein leg    . vitreous retinal surgery       OB History   No obstetric history on file.      Home Medications    Prior to Admission medications   Medication Sig Start Date End Date Taking? Authorizing Provider  albuterol (VENTOLIN HFA) 108 (90 Base) MCG/ACT inhaler Inhale into the lungs. 09/29/18   [provider]  atorvastatin (LIPITOR) 20 MG tablet Take by mouth daily. 02/15/18 02/15/19  [provider]  azelastine (ASTELIN) 0.1 % nasal spray USE 1 SPRAY IN EACH  NOSTRIL TWICE A DAY 09/02/18   [provider]  Calcium Carbonate-Vitamin D (CALCIUM 500 + D) 500-125 MG-UNIT TABS Take by mouth daily.    [provider]  camphor-menthol Timoteo Ace) lotion Apply topically. 02/07/18 02/07/19  [provider]  clopidogrel (PLAVIX) 75 MG tablet Take by mouth. 08/16/18 08/16/19  [provider]  conjugated estrogens (PREMARIN) vaginal cream Place vaginally. 12/20/18   [provider]  Cranberry 400 MG CAPS Take by mouth.    [provider]  denosumab (PROLIA) 60 MG/ML SOSY injection Inject into the skin.    [provider]  Ferrous Fumarate (HEMOCYTE - 106 MG FE) 324 (106 Fe) MG TABS tablet Take by mouth. 02/09/18   [provider]  folic acid (FOLVITE) 1 MG tablet Take by mouth. 03/02/18 03/02/19  [provider]  gabapentin (NEURONTIN) 100 MG capsule Take 100 mg by mouth daily.    [provider]  gabapentin (NEURONTIN) 300 MG capsule Take by mouth. 04/13/18   [provider]  guaiFENesin (MUCINEX) 600 MG 12 hr tablet Take by mouth.    [provider]  magnesium oxide (MAG-OX) 400 MG tablet Take by mouth. 06/02/18 06/02/19  [provider]  Multiple Vitamin (  MULTI-VITAMIN) tablet Take by mouth.    [provider]  mupirocin ointment (BACTROBAN) 2 % Apply topically. 08/28/16   [provider]  nicotine polacrilex (COMMIT) 4 MG lozenge Start 4 weeks prior to Quit Day.  Place 1 lozenge between cheek and gum every 15 minutes for urges 09/10/16   [provider]  OXYGEN 2 liters at hs    [provider]  pantoprazole (PROTONIX) 40 MG tablet TAKE 1 TABLET DAILY 12/01/18   [provider]  umeclidinium-vilanterol (ANORO ELLIPTA) 62.5-25 MCG/INH AEPB Inhale into the lungs. 12/19/16   [provider]    Family History Family History  Problem Relation Age of Onset  . Alzheimer's disease Mother   . Pneumonia Father         aspiration pneumonia    Social History Social History   Tobacco Use  . Smoking status: Former Smoker    Packs/day: 1.50    Years: 50.00    Pack years: 75.00    Start date: 1970    Quit date: 2018    Years since quitting: 2.5  . Smokeless tobacco: Never Used  Substance Use Topics  . Alcohol use: Yes  . Drug use: Not Currently     Allergies   Patient has no known allergies.   Review of Systems Review of Systems   Physical Exam Updated Vital Signs BP 123/67   Pulse 77   Temp 98.3 F (36.8 C)   Resp 19   SpO2 93%   Physical Exam Vitals signs and nursing note reviewed.  HENT:     Head: Normocephalic and atraumatic.     Nose: Nose normal.     Mouth/Throat:     Mouth: Mucous membranes are moist.  Eyes:     Extraocular Movements: Extraocular movements intact.     Pupils: Pupils are equal, round, and reactive to light.  Neck:     Musculoskeletal: Normal range of motion.  Cardiovascular:     Rate and Rhythm: Normal rate and regular rhythm.  Pulmonary:     Effort: Pulmonary effort is normal.     Breath sounds: Normal breath sounds.  Abdominal:     General: Abdomen is flat.     Palpations: Abdomen is soft.  Musculoskeletal:     Comments: Contusion left hip with severe ttp lle with toes pink but unable to palpate dp- ho pvd s/p angioplasty in past Left elbow with contusion an skin tear  Skin:    General: Skin is warm and dry.     Capillary Refill: Capillary refill takes less than 2 seconds.     Findings: Bruising present.  Neurological:     General: No focal deficit present.     Mental Status: She is alert.     Cranial Nerves: No cranial nerve deficit.  Psychiatric:        Mood and Affect: Mood normal.      ED Treatments / Results  Labs (all labs ordered are listed, but only abnormal results are displayed) Labs Reviewed  BASIC METABOLIC PANEL  CBC WITH DIFFERENTIAL/PLATELET  PROTIME-INR  TYPE AND SCREEN    EKG None ED ECG REPORT   Date:  01/29/2019  Rate: 75  Rhythm: normal sinus rhythm  QRS Axis: right  Intervals: normal  ST/T Wave abnormalities: normal  Conduction Disutrbances:right bundle branch block  Narrative Interpretation:   Old EKG Reviewed: none available  I have personally reviewed the EKG tracing and agree with the computerized printout as noted.  Radiology  Dg Chest 1 View  Result Date: 01/29/2019 CLINICAL DATA:  Left hip fracture.  Pre-op respiratory exam EXAM: CHEST  1 VIEW COMPARISON:  01/27/2019 FINDINGS: Heart size is normal. Aortic atherosclerosis. Pulmonary hyperinflation again seen, consistent with COPD. Both lungs are clear. IMPRESSION: COPD. No active disease. Electronically Signed   By: Marlaine Hind M.D.   On: 01/29/2019 14:58   Dg Elbow 2 Views Left  Result Date: 01/29/2019 CLINICAL DATA:  Fall today.  Left elbow pain.  Initial encounter. EXAM: LEFT ELBOW - 2 VIEW COMPARISON:  None. FINDINGS: There is no evidence of fracture, dislocation, or joint effusion. There is no evidence of arthropathy or other focal bone abnormality. Soft tissues are unremarkable. IMPRESSION: Negative. Electronically Signed   By: Marlaine Hind M.D.   On: 01/29/2019 14:59   Dg Hip Unilat With Pelvis 2-3 Views Left  Result Date: 01/29/2019 CLINICAL DATA:  Fall today. Left hip pain.  Initial encounter. EXAM: DG HIP (WITH OR WITHOUT PELVIS) 2-3V LEFT COMPARISON:  None. FINDINGS: Mildly impacted and displaced intertrochanteric left hip fracture is seen. No evidence of hip dislocation. Mild left hip osteoarthritis noted. IMPRESSION: Intertrochanteric left hip fracture. Electronically Signed   By: Marlaine Hind M.D.   On: 01/29/2019 14:57    Procedures Procedures (including critical care time)  Medications Ordered in ED Medications  0.9 %  sodium chloride infusion (has no administration in time range)  fentaNYL (SUBLIMAZE) injection 50 mcg (has no administration in time range)     Initial Impression / Assessment and Plan /  ED Course  I have reviewed the triage vital signs and the nursing notes.  Pertinent labs & imaging results that were available during my care of the patient were reviewed by me and considered in my medical decision making (see chart for details).      73 year old female history of COPD's morning.  Presents with left hip fracture.  Patient also with some left elbow injury.  No definite acute fracture of the elbow.  Otherwise patient appears to be stable.  Discussed with Hilbert Odor, on-call for Ortho.  Will consult hospitalist for admission Discussed with patient Discussed with Dr. Waldron Labs and will see for admission Final Clinical Impressions(s) / ED Diagnoses   Final diagnoses:  Fall, initial encounter  Closed fracture of left hip, initial encounter [facility])    ED Discharge Orders    None       Pattricia Boss, MD 01/29/19 1520

## 2019-01-29 NOTE — ED Notes (Signed)
Attempted report 

## 2019-01-29 NOTE — ED Notes (Signed)
Admitting at bedside 

## 2019-01-30 ENCOUNTER — Inpatient Hospital Stay (HOSPITAL_COMMUNITY): Payer: Medicare Other

## 2019-01-30 ENCOUNTER — Inpatient Hospital Stay (HOSPITAL_COMMUNITY): Payer: Medicare Other | Admitting: Certified Registered"

## 2019-01-30 ENCOUNTER — Encounter (HOSPITAL_COMMUNITY)
Admission: EM | Disposition: A | Discharge status: Home / Self Care | Payer: Self-pay | Source: Home / Self Care | Attending: Internal Medicine

## 2019-01-30 ENCOUNTER — Encounter (HOSPITAL_COMMUNITY): Payer: Self-pay | Admitting: Surgery

## 2019-01-30 DIAGNOSIS — S72002D Fracture of unspecified part of neck of left femur, subsequent encounter for closed fracture with routine healing: Secondary | ICD-10-CM

## 2019-01-30 DIAGNOSIS — N179 Acute kidney failure, unspecified: Secondary | ICD-10-CM

## 2019-01-30 DIAGNOSIS — I739 Peripheral vascular disease, unspecified: Secondary | ICD-10-CM

## 2019-01-30 DIAGNOSIS — E43 Unspecified severe protein-calorie malnutrition: Secondary | ICD-10-CM

## 2019-01-30 HISTORY — PX: INTRAMEDULLARY (IM) NAIL INTERTROCHANTERIC: SHX5875

## 2019-01-30 LAB — BASIC METABOLIC PANEL
Anion gap: 9 (ref 5–15)
BUN: 12 mg/dL (ref 8–23)
CO2: 24 mmol/L (ref 22–32)
Calcium: 8.4 mg/dL — ABNORMAL LOW (ref 8.9–10.3)
Chloride: 94 mmol/L — ABNORMAL LOW (ref 98–111)
Creatinine, Ser: 0.81 mg/dL (ref 0.44–1.00)
GFR calc Af Amer: >60 mL/min (ref >60)
GFR calc non Af Amer: >60 mL/min (ref >60)
Glucose, Bld: 165 mg/dL — ABNORMAL HIGH (ref 70–99)
Potassium: 5.2 mmol/L — ABNORMAL HIGH (ref 3.5–5.1)
Sodium: 127 mmol/L — ABNORMAL LOW (ref 135–145)

## 2019-01-30 LAB — SURGICAL PCR SCREEN
MRSA, PCR: NEGATIVE
Staphylococcus aureus: NEGATIVE

## 2019-01-30 LAB — IRON AND TIBC
Iron: 29 ug/dL (ref 28–170)
Saturation Ratios: 14 % (ref 10.4–31.8)
TIBC: 210 ug/dL — ABNORMAL LOW (ref 250–450)
UIBC: 181 ug/dL

## 2019-01-30 LAB — FERRITIN: Ferritin: 49 ng/mL (ref 11–307)

## 2019-01-30 LAB — TSH: TSH: 2.703 u[IU]/mL (ref 0.350–4.500)

## 2019-01-30 LAB — CBC
HCT: 23.9 % — ABNORMAL LOW (ref 36.0–46.0)
Hemoglobin: 8.2 g/dL — ABNORMAL LOW (ref 12.0–15.0)
MCH: 34.7 pg — ABNORMAL HIGH (ref 26.0–34.0)
MCHC: 34.3 g/dL (ref 30.0–36.0)
MCV: 101.3 fL — ABNORMAL HIGH (ref 80.0–100.0)
Platelets: 219 10*3/uL (ref 150–400)
RBC: 2.36 MIL/uL — ABNORMAL LOW (ref 3.87–5.11)
RDW: 14.9 % (ref 11.5–15.5)
WBC: 7.7 10*3/uL (ref 4.0–10.5)
nRBC: 0 % (ref 0.0–0.2)

## 2019-01-30 LAB — B12 AND FOLATE PANEL
Folate: 19.7 ng/mL
Vitamin B-12: >2000 pg/mL — ABNORMAL HIGH (ref 200–1100)

## 2019-01-30 LAB — VITAMIN B1: Vitamin B1 (Thiamine): <6 nmol/L — ABNORMAL LOW (ref 8–30)

## 2019-01-30 LAB — CK: Total CK: 46 U/L (ref 38–234)

## 2019-01-30 SURGERY — FIXATION, FRACTURE, INTERTROCHANTERIC, WITH INTRAMEDULLARY ROD
Anesthesia: General | Laterality: Left

## 2019-01-30 MED ORDER — FERROUS SULFATE 325 (65 FE) MG PO TABS
325.0000 mg | ORAL_TABLET | Freq: Every day | ORAL | Status: DC
  Administered 2019-01-31 – 2019-02-02 (×3): 325 mg via ORAL
  Filled 2019-01-30 (×3): qty 1

## 2019-01-30 MED ORDER — CALCIUM CARBONATE-VITAMIN D 500-125 MG-UNIT PO TABS: ORAL_TABLET | Freq: Every day | ORAL | Status: DC

## 2019-01-30 MED ORDER — CALCIUM CARBONATE-VITAMIN D 500-200 MG-UNIT PO TABS
1.0000 | ORAL_TABLET | Freq: Every day | ORAL | Status: DC
  Administered 2019-01-31 – 2019-02-02 (×3): 1 via ORAL
  Filled 2019-01-30 (×3): qty 1

## 2019-01-30 MED ORDER — PROPOFOL 10 MG/ML IV BOLUS
INTRAVENOUS | Status: DC | PRN
  Administered 2019-01-30: 100 mg via INTRAVENOUS

## 2019-01-30 MED ORDER — MIDAZOLAM HCL 2 MG/2ML IJ SOLN
INTRAMUSCULAR | Status: AC
  Filled 2019-01-30: qty 2

## 2019-01-30 MED ORDER — FENTANYL CITRATE (PF) 100 MCG/2ML IJ SOLN
INTRAMUSCULAR | Status: AC
  Filled 2019-01-30: qty 2

## 2019-01-30 MED ORDER — IPRATROPIUM-ALBUTEROL 0.5-2.5 (3) MG/3ML IN SOLN: 3.0000 mL | RESPIRATORY_TRACT | Status: DC | PRN

## 2019-01-30 MED ORDER — DEXAMETHASONE SODIUM PHOSPHATE 10 MG/ML IJ SOLN
INTRAMUSCULAR | Status: AC
  Filled 2019-01-30: qty 1

## 2019-01-30 MED ORDER — PROPOFOL 10 MG/ML IV BOLUS
INTRAVENOUS | Status: AC
  Filled 2019-01-30: qty 20

## 2019-01-30 MED ORDER — ROCURONIUM BROMIDE 10 MG/ML (PF) SYRINGE
PREFILLED_SYRINGE | INTRAVENOUS | Status: DC | PRN
  Administered 2019-01-30 (×2): 30 mg via INTRAVENOUS
  Administered 2019-01-30 (×2): 10 mg via INTRAVENOUS

## 2019-01-30 MED ORDER — POLYETHYLENE GLYCOL 3350 17 G PO PACK: 17.0000 g | PACK | Freq: Every day | ORAL | Status: DC | PRN

## 2019-01-30 MED ORDER — CLOPIDOGREL BISULFATE 75 MG PO TABS
75.0000 mg | ORAL_TABLET | Freq: Every day | ORAL | Status: DC
  Administered 2019-01-31 – 2019-02-02 (×3): 75 mg via ORAL
  Filled 2019-01-30 (×3): qty 1

## 2019-01-30 MED ORDER — METOPROLOL TARTRATE 5 MG/5ML IV SOLN
INTRAVENOUS | Status: AC
  Filled 2019-01-30: qty 5

## 2019-01-30 MED ORDER — IPRATROPIUM-ALBUTEROL 0.5-2.5 (3) MG/3ML IN SOLN: 3.0000 mL | Freq: Four times a day (QID) | RESPIRATORY_TRACT | Status: DC

## 2019-01-30 MED ORDER — ONDANSETRON HCL 4 MG/2ML IJ SOLN
INTRAMUSCULAR | Status: AC
  Filled 2019-01-30: qty 2

## 2019-01-30 MED ORDER — FENTANYL CITRATE (PF) 100 MCG/2ML IJ SOLN
25.0000 ug | INTRAMUSCULAR | Status: DC | PRN
  Administered 2019-01-30 (×3): 25 ug via INTRAVENOUS

## 2019-01-30 MED ORDER — LACTATED RINGERS IV SOLN
INTRAVENOUS | Status: DC
  Administered 2019-01-30 (×2): via INTRAVENOUS

## 2019-01-30 MED ORDER — DEXAMETHASONE SODIUM PHOSPHATE 10 MG/ML IJ SOLN
INTRAMUSCULAR | Status: DC | PRN
  Administered 2019-01-30: 10 mg via INTRAVENOUS

## 2019-01-30 MED ORDER — FENTANYL CITRATE (PF) 250 MCG/5ML IJ SOLN
INTRAMUSCULAR | Status: AC
  Filled 2019-01-30: qty 5

## 2019-01-30 MED ORDER — 0.9 % SODIUM CHLORIDE (POUR BTL) OPTIME
TOPICAL | Status: DC | PRN
  Administered 2019-01-30: 13:00:00 1000 mL

## 2019-01-30 MED ORDER — ONDANSETRON HCL 4 MG/2ML IJ SOLN: 4.0000 mg | Freq: Once | INTRAMUSCULAR | Status: DC | PRN

## 2019-01-30 MED ORDER — ROCURONIUM BROMIDE 10 MG/ML (PF) SYRINGE
PREFILLED_SYRINGE | INTRAVENOUS | Status: AC
  Filled 2019-01-30: qty 10

## 2019-01-30 MED ORDER — LIDOCAINE 2% (20 MG/ML) 5 ML SYRINGE
INTRAMUSCULAR | Status: DC | PRN
  Administered 2019-01-30: 60 mg via INTRAVENOUS

## 2019-01-30 MED ORDER — PHENYLEPHRINE HCL (PRESSORS) 10 MG/ML IV SOLN
INTRAVENOUS | Status: AC
  Filled 2019-01-30: qty 1

## 2019-01-30 MED ORDER — METOPROLOL TARTRATE 5 MG/5ML IV SOLN
1.0000 mg | INTRAVENOUS | Status: DC | PRN
  Administered 2019-01-30: 14:00:00 1 mg via INTRAVENOUS

## 2019-01-30 MED ORDER — SODIUM CHLORIDE 0.9 % IV SOLN
INTRAVENOUS | Status: DC | PRN
  Administered 2019-01-30: 40 ug/min via INTRAVENOUS

## 2019-01-30 MED ORDER — DOCUSATE SODIUM 100 MG PO CAPS
100.0000 mg | ORAL_CAPSULE | Freq: Two times a day (BID) | ORAL | Status: DC
  Administered 2019-01-30 – 2019-02-02 (×6): 100 mg via ORAL
  Filled 2019-01-30 (×6): qty 1

## 2019-01-30 MED ORDER — SUGAMMADEX SODIUM 200 MG/2ML IV SOLN
INTRAVENOUS | Status: DC | PRN
  Administered 2019-01-30: 80 mg via INTRAVENOUS

## 2019-01-30 MED ORDER — LIDOCAINE 2% (20 MG/ML) 5 ML SYRINGE
INTRAMUSCULAR | Status: AC
  Filled 2019-01-30: qty 5

## 2019-01-30 MED ORDER — PHENYLEPHRINE 40 MCG/ML (10ML) SYRINGE FOR IV PUSH (FOR BLOOD PRESSURE SUPPORT)
PREFILLED_SYRINGE | INTRAVENOUS | Status: DC | PRN
  Administered 2019-01-30 (×2): 200 ug via INTRAVENOUS

## 2019-01-30 MED ORDER — SODIUM CHLORIDE 0.9 % IV SOLN
INTRAVENOUS | Status: DC
  Administered 2019-01-30: 09:00:00 via INTRAVENOUS

## 2019-01-30 MED ORDER — ONDANSETRON HCL 4 MG/2ML IJ SOLN: INTRAMUSCULAR | Status: DC | PRN

## 2019-01-30 MED ORDER — BISACODYL 5 MG PO TBEC: 5.0000 mg | DELAYED_RELEASE_TABLET | Freq: Every day | ORAL | Status: DC | PRN

## 2019-01-30 MED ORDER — FENTANYL CITRATE (PF) 100 MCG/2ML IJ SOLN
INTRAMUSCULAR | Status: DC | PRN
  Administered 2019-01-30 (×2): 100 ug via INTRAVENOUS
  Administered 2019-01-30: 50 ug via INTRAVENOUS

## 2019-01-30 MED ORDER — MIDAZOLAM HCL 2 MG/2ML IJ SOLN
INTRAMUSCULAR | Status: DC | PRN
  Administered 2019-01-30 (×2): 1 mg via INTRAVENOUS

## 2019-01-30 MED ORDER — ONDANSETRON HCL 4 MG/2ML IJ SOLN
INTRAMUSCULAR | Status: DC | PRN
  Administered 2019-01-30: 4 mg via INTRAVENOUS

## 2019-01-30 MED ORDER — ONDANSETRON HCL 4 MG/2ML IJ SOLN: 4.0000 mg | Freq: Four times a day (QID) | INTRAMUSCULAR | Status: DC | PRN

## 2019-01-30 MED ORDER — ASPIRIN EC 81 MG PO TBEC
81.0000 mg | DELAYED_RELEASE_TABLET | Freq: Every day | ORAL | Status: DC
  Administered 2019-01-31 – 2019-02-02 (×3): 81 mg via ORAL
  Filled 2019-01-30 (×3): qty 1

## 2019-01-30 MED ORDER — CEFAZOLIN SODIUM-DEXTROSE 2-4 GM/100ML-% IV SOLN
2.0000 g | Freq: Four times a day (QID) | INTRAVENOUS | Status: AC
  Administered 2019-01-30 – 2019-01-31 (×2): 2 g via INTRAVENOUS
  Filled 2019-01-30 (×2): qty 100

## 2019-01-30 MED ORDER — HYDRALAZINE HCL 20 MG/ML IJ SOLN: 10.0000 mg | INTRAMUSCULAR | Status: DC | PRN

## 2019-01-30 MED ORDER — PHENYLEPHRINE 40 MCG/ML (10ML) SYRINGE FOR IV PUSH (FOR BLOOD PRESSURE SUPPORT)
PREFILLED_SYRINGE | INTRAVENOUS | Status: AC
  Filled 2019-01-30: qty 10

## 2019-01-30 MED ORDER — DEXTROSE-NACL 5-0.45 % IV SOLN
INTRAVENOUS | Status: DC
  Administered 2019-01-30: 16:00:00 via INTRAVENOUS

## 2019-01-30 MED ORDER — ALBUMIN HUMAN 5 % IV SOLN
INTRAVENOUS | Status: DC | PRN
  Administered 2019-01-30: 12:00:00 via INTRAVENOUS

## 2019-01-30 MED ORDER — ONDANSETRON HCL 4 MG PO TABS: 4.0000 mg | ORAL_TABLET | Freq: Four times a day (QID) | ORAL | Status: DC | PRN

## 2019-01-30 SURGICAL SUPPLY — 49 items
BIT DRILL 4.3MMS DISTAL GRDTED (BIT) IMPLANT
BNDG COHESIVE 6X5 TAN STRL LF (GAUZE/BANDAGES/DRESSINGS) ×3 IMPLANT
CORTICAL BONE SCR 5.0MM X 46MM (Screw) ×3 IMPLANT
COVER PERINEAL POST (MISCELLANEOUS) ×3 IMPLANT
COVER SURGICAL LIGHT HANDLE (MISCELLANEOUS) ×4 IMPLANT
COVER WAND RF STERILE (DRAPES) ×3 IMPLANT
DRAPE C-ARM 42X72 X-RAY (DRAPES) ×2 IMPLANT
DRAPE IMP U-DRAPE 54X76 (DRAPES) ×2 IMPLANT
DRAPE ORTHO SPLIT 77X108 STRL (DRAPES) ×2
DRAPE STERI IOBAN 125X83 (DRAPES) ×3 IMPLANT
DRAPE SURG ORHT 6 SPLT 77X108 (DRAPES) IMPLANT
DRAPE U-SHAPE 47X51 STRL (DRAPES) ×4 IMPLANT
DRESSING ALLEVYN LIFE SACRUM (GAUZE/BANDAGES/DRESSINGS) ×2 IMPLANT
DRILL 4.3MMS DISTAL GRADUATED (BIT) ×3
DRSG MEPILEX BORDER 4X4 (GAUZE/BANDAGES/DRESSINGS) ×6 IMPLANT
DRSG XEROFORM 1X8 (GAUZE/BANDAGES/DRESSINGS) ×2 IMPLANT
DURAPREP 26ML APPLICATOR (WOUND CARE) ×3 IMPLANT
ELECT REM PT RETURN 9FT ADLT (ELECTROSURGICAL) ×3
ELECTRODE REM PT RTRN 9FT ADLT (ELECTROSURGICAL) ×1 IMPLANT
GLOVE BIOGEL PI IND STRL 8 (GLOVE) ×2 IMPLANT
GLOVE BIOGEL PI INDICATOR 8 (GLOVE) ×4
GLOVE ECLIPSE 7.5 STRL STRAW (GLOVE) ×6 IMPLANT
GOWN STRL REUS W/ TWL LRG LVL3 (GOWN DISPOSABLE) ×1 IMPLANT
GOWN STRL REUS W/ TWL XL LVL3 (GOWN DISPOSABLE) ×2 IMPLANT
GOWN STRL REUS W/TWL LRG LVL3 (GOWN DISPOSABLE) ×2
GOWN STRL REUS W/TWL XL LVL3 (GOWN DISPOSABLE) ×4
GUIDEPIN 3.2X17.5 THRD DISP (PIN) ×2 IMPLANT
GUIDEWIRE BALL NOSE 80CM (WIRE) ×2 IMPLANT
HFN LH 130 DEG 11MM X 340MM (Nail) ×2 IMPLANT
HIP FRAC NAIL LAG SCR 10.5X100 (Orthopedic Implant) ×2 IMPLANT
KIT BASIN OR (CUSTOM PROCEDURE TRAY) ×3 IMPLANT
MANIFOLD NEPTUNE II (INSTRUMENTS) ×3 IMPLANT
NS IRRIG 1000ML POUR BTL (IV SOLUTION) ×3 IMPLANT
PACK GENERAL/GYN (CUSTOM PROCEDURE TRAY) ×3 IMPLANT
PAD ARMBOARD 7.5X6 YLW CONV (MISCELLANEOUS) ×6 IMPLANT
SCREW ANTI ROTATION 80MM (Screw) ×2 IMPLANT
SCREW BONE CORTICAL 5.0X42 (Screw) ×2 IMPLANT
SCREW CANN THRD AFF 10.5X100 (Orthopedic Implant) IMPLANT
SCREW CORTICL BON 5.0MM X 46MM (Screw) IMPLANT
SCREW DRILL BIT ANIT ROTATION (BIT) ×2 IMPLANT
STAPLER VISISTAT 35W (STAPLE) ×3 IMPLANT
SUT VIC AB 0 CT1 27 (SUTURE) ×2
SUT VIC AB 0 CT1 27XBRD ANBCTR (SUTURE) IMPLANT
SUT VIC AB 1 CTB1 27 (SUTURE) ×3 IMPLANT
SUT VIC AB 2-0 CTB1 (SUTURE) ×3 IMPLANT
TOWEL GREEN STERILE (TOWEL DISPOSABLE) ×3 IMPLANT
TRAY FOLEY W/BAG SLVR 16FR (SET/KITS/TRAYS/PACK) ×2
TRAY FOLEY W/BAG SLVR 16FR ST (SET/KITS/TRAYS/PACK) IMPLANT
WATER STERILE IRR 1000ML POUR (IV SOLUTION) ×3 IMPLANT

## 2019-01-30 NOTE — Anesthesia Postprocedure Evaluation (Signed)
Anesthesia Post Note  Patient: Berniece Abid  Procedure(s) Performed: INTRAMEDULLARY (IM) NAIL INTERTROCHANTRIC (Left )     Patient location during evaluation: PACU Anesthesia Type: General Level of consciousness: awake Pain management: pain level controlled Vital Signs Assessment: post-procedure vital signs reviewed and stable Respiratory status: spontaneous breathing, nonlabored ventilation, respiratory function stable and patient connected to nasal cannula oxygen Cardiovascular status: blood pressure returned to baseline and stable Postop Assessment: no apparent nausea or vomiting Anesthetic complications: no    Last Vitals:  Vitals:   01/30/19 1450 01/30/19 1519  BP: (!) 113/56 (!) 106/54  Pulse: 94 87  Resp: 20 16  Temp:  36.6 C  SpO2: 98% 95%    Last Pain:  Vitals:   01/30/19 1519  TempSrc: Oral  PainSc: 3                  Eulas Schweitzer P Darnita Woodrum

## 2019-01-30 NOTE — Progress Notes (Signed)
PROGRESS NOTE    Beth Daniels  ZSW:[id number] DOB: Feb 16, 1946 DOA: 01/29/2019 PCP: Shon Baton, MD   Brief Narrative:  73 year old with history of osteoporosis, breast cancer status post mastectomy, chronic hyponatremia, COPD with chronic hypoxia on 2 L nasal cannula, alcohol abuse, peripheral vascular disease status post stent placement in the left lower extremity presented with mechanical fall due to poor gait from alcohol-induced neuropathy.  Was found to have left hip fracture.  Orthopedic was consulted.   Assessment & Plan:   Active Problems:   COPD (chronic obstructive pulmonary disease) (HCC)   PVD (peripheral vascular disease) (HCC)   Alcohol dependence ([place])   Closed left hip fracture (HCC)  Mechanical fall leading to left intertrochanteric impacted fracture with mild displacement, closed -Plan for surgical intervention per orthopedic. -Pain control, PT/OT postoperatively -Bowel regimen, incentive spirometer.   - Check vitamin D levels. - DVT prophylaxis postoperatively per orthopedic recommendation.  Acute kidney injury -Baseline creatinine 0.3, this morning is 0.8. -We will place him on normal saline at 75 cc/h, monitor urine output. -CK-within normal limits  Hyperkalemia - Repeat EKG this morning-no peaked T waves noted. -EKG from yesterday showed right bundle branch block.  Macrocytic anemia - Prior to admission B12 and folate within normal limits. -TSH within normal limits -Iron studies suggestive of borderline low ferritin, will start iron supplements.  History of COPD with chronic hypoxia on 2 L nasal cannula -Appears to be stable at the moment.  Will place him on bronchodilators. -Incentive spirometer.  History of peripheral vascular disease status post stent placement 3 years ago and left lower extremity -Plavix on hold due to surgical intervention planned today.  Hyperlipidemia -On statin.  Alcohol-induced neuropathy Alcohol dependence -On gabapentin.  -Alcohol withdrawal protocol. -Multivitamin, folate and thiamine ordered.  Severe protein calorie malnutrition -Dietitian consulted.  Chronic hyponatremia - Likely due to underlying alcohol use.  We will continue to monitor sodium levels.  History of osteoporosis -Receives outpatient Prolia.   DVT prophylaxis: SCDs Code Status: Full code Family Communication: None at bedside but husband was over the phone. Disposition Plan: Maintain hospital stay for surgical intervention  Consultants:   Orthopedic  Procedures:   None so far  Antimicrobials:   Perioperative   Subjective: No complaints besides her hip pain.  Tells me she drinks about 4 glasses of wine daily.  Review of Systems Otherwise negative except as per HPI, including: General = no fevers, chills, dizziness, malaise, fatigue HEENT/EYES = negative for pain, redness, loss of vision, double vision, blurred vision, loss of hearing, sore throat, hoarseness, dysphagia Cardiovascular= negative for chest pain, palpitation, murmurs, lower extremity swelling Respiratory/lungs= negative for shortness of breath, cough, hemoptysis, wheezing, mucus production Gastrointestinal= negative for nausea, vomiting,, abdominal pain, melena, hematemesis Genitourinary= negative for Dysuria, Hematuria, Change in Urinary Frequency MSK = Negative for arthralgia, myalgias, Back Pain, Joint swelling  Neurology= Negative for headache, seizures, numbness, tingling  Psychiatry= Negative for anxiety, depression, suicidal and homocidal ideation Allergy/Immunology= Medication/Food allergy as listed  Skin= Negative for Rash, lesions, ulcers, itching   Objective: Vitals:   01/30/19 0315 01/30/19 0320 01/30/19 0542 01/30/19 0758  BP: (!) 79/50 (!) 88/52 (!) 100/57   Pulse: 94  89   Resp:      Temp: 98.6 F (37 C)     TempSrc: Oral     SpO2:  97%  98%    Intake/Output Summary (Last 24 hours) at 01/30/2019 0759 Last data filed at 01/30/2019  0640 Gross per 24 hour  Intake  170.99 ml  Output -  Net 170.99 ml   There were no vitals filed for this visit.  Examination:  Constitutional: NAD, calm, comfortable, Bilateral temporal wasting Eyes: PERRL, lids and conjunctivae normal ENMT: Mucous membranes are moist. Posterior pharynx clear of any exudate or lesions.Normal dentition.  Neck: normal, supple, no masses, no thyromegaly Respiratory: clear to auscultation bilaterally, no wheezing, no crackles. Normal respiratory effort. No accessory muscle use.  Cardiovascular: Regular rate and rhythm, no murmurs / rubs / gallops. No extremity edema. 2+ pedal pulses. No carotid bruits.  Abdomen: no tenderness, no masses palpated. No hepatosplenomegaly. Bowel sounds positive.  Musculoskeletal: Limited range of motion of left lower extremity due to fracture Skin: no rashes, lesions, ulcers. No induration Neurologic: CN 2-12 grossly intact. Sensation intact, DTR normal. Strength 5/5 in all 4.  Psychiatric: Normal judgment and insight. Alert and oriented x 3. Normal mood.     Data Reviewed:   CBC: Recent Labs  Lab 01/29/19 1354 01/30/19 0312  WBC 4.6 7.7  NEUTROABS 3.5  --   HGB 11.6* 8.2*  HCT 34.7* 23.9*  MCV 104.2* 101.3*  PLT 279 979   Basic Metabolic Panel: Recent Labs  Lab 01/29/19 1354 01/30/19 0312  NA 132* 127*  K 4.1 5.2*  CL 96* 94*  CO2 21* 24  GLUCOSE 72 165*  BUN <5* 12  CREATININE 0.36* 0.81  CALCIUM 8.4* 8.4*   GFR: Estimated Creatinine Clearance: 40 mL/min (by C-G formula based on SCr of 0.81 mg/dL). Liver Function Tests: No results for input(s): AST, ALT, ALKPHOS, BILITOT, PROT, ALBUMIN in the last 168 hours. No results for input(s): LIPASE, AMYLASE in the last 168 hours. No results for input(s): AMMONIA in the last 168 hours. Coagulation Profile: Recent Labs  Lab 01/29/19 1354  INR 1.0   Cardiac Enzymes: No results for input(s): CKTOTAL, CKMB, CKMBINDEX, TROPONINI in the last 168 hours. BNP  (last 3 results) No results for input(s): PROBNP in the last 8760 hours. HbA1C: No results for input(s): HGBA1C in the last 72 hours. CBG: No results for input(s): GLUCAP in the last 168 hours. Lipid Profile: No results for input(s): CHOL, HDL, LDLCALC, TRIG, CHOLHDL, LDLDIRECT in the last 72 hours. Thyroid Function Tests: No results for input(s): TSH, T4TOTAL, FREET4, T3FREE, THYROIDAB in the last 72 hours. Anemia Panel: No results for input(s): VITAMINB12, FOLATE, FERRITIN, TIBC, IRON, RETICCTPCT in the last 72 hours. Sepsis Labs: No results for input(s): PROCALCITON, LATICACIDVEN in the last 168 hours.  Recent Results (from the past 240 hour(s))  SARS Coronavirus 2 (CEPHEID - Performed in [place] hospital lab), Hosp Order     Status: None   Collection Time: 01/29/19  3:20 PM   Specimen: Nasopharyngeal Swab  Result Value Ref Range Status   SARS Coronavirus 2 NEGATIVE NEGATIVE Final    Comment: (NOTE) If result is NEGATIVE SARS-CoV-2 target nucleic acids are NOT DETECTED. The SARS-CoV-2 RNA is generally detectable in upper and lower  respiratory specimens during the acute phase of infection. The lowest  concentration of SARS-CoV-2 viral copies this assay can detect is 250  copies / mL. A negative result does not preclude SARS-CoV-2 infection  and should not be used as the sole basis for treatment or other  patient management decisions.  A negative result may occur with  improper specimen collection / handling, submission of specimen other  than nasopharyngeal swab, presence of viral mutation(s) within the  areas targeted by this assay, and inadequate number of viral copies  (<  250 copies / mL). A negative result must be combined with clinical  observations, patient history, and epidemiological information. If result is POSITIVE SARS-CoV-2 target nucleic acids are DETECTED. The SARS-CoV-2 RNA is generally detectable in upper and lower  respiratory specimens dur ing the acute  phase of infection.  Positive  results are indicative of active infection with SARS-CoV-2.  Clinical  correlation with patient history and other diagnostic information is  necessary to determine patient infection status.  Positive results do  not rule out bacterial infection or co-infection with other viruses. If result is PRESUMPTIVE POSTIVE SARS-CoV-2 nucleic acids MAY BE PRESENT.   A presumptive positive result was obtained on the submitted specimen  and confirmed on repeat testing.  While 2019 novel coronavirus  (SARS-CoV-2) nucleic acids may be present in the submitted sample  additional confirmatory testing may be necessary for epidemiological  and / or clinical management purposes  to differentiate between  SARS-CoV-2 and other Sarbecovirus currently known to infect humans.  If clinically indicated additional testing with an alternate test  methodology [phone]) is advised. The SARS-CoV-2 RNA is generally  detectable in upper and lower respiratory sp ecimens during the acute  phase of infection. The expected result is Negative. Fact Sheet for Patients:  StrictlyIdeas.no Fact Sheet for Healthcare Providers: BankingDealers.co.za This test is not yet approved or cleared by the [place] FDA and has been authorized for detection and/or diagnosis of SARS-CoV-2 by FDA under an Emergency Use Authorization (EUA).  This EUA will remain in effect (meaning this test can be used) for the duration of the COVID-19 declaration under Section 564(b)(1) of the Act, 21 U.S.C. section 360bbb-3(b)(1), unless the authorization is terminated or revoked sooner. Performed at [facility], [place] [address] [place], [place] [address]          Radiology Studies: Dg Chest 1 View  Result Date: 01/29/2019 CLINICAL DATA:  Left hip fracture.  Pre-op respiratory exam EXAM: CHEST  1 VIEW COMPARISON:  01/27/2019 FINDINGS: Heart size is normal. Aortic  atherosclerosis. Pulmonary hyperinflation again seen, consistent with COPD. Both lungs are clear. IMPRESSION: COPD. No active disease. Electronically Signed   By: Marlaine Hind M.D.   On: 01/29/2019 14:58   Dg Elbow 2 Views Left  Result Date: 01/29/2019 CLINICAL DATA:  Fall today.  Left elbow pain.  Initial encounter. EXAM: LEFT ELBOW - 2 VIEW COMPARISON:  None. FINDINGS: There is no evidence of fracture, dislocation, or joint effusion. There is no evidence of arthropathy or other focal bone abnormality. Soft tissues are unremarkable. IMPRESSION: Negative. Electronically Signed   By: Marlaine Hind M.D.   On: 01/29/2019 14:59   Ct Hip Left Wo Contrast  Result Date: 01/29/2019 CLINICAL DATA:  Left proximal femur fracture. Left hip pain secondary to a fall. EXAM: CT OF THE LEFT HIP WITHOUT CONTRAST TECHNIQUE: Multidetector CT imaging of the left hip was performed according to the standard protocol. Multiplanar CT image reconstructions were also generated. COMPARISON:  Radiographs dated 01/29/2019 FINDINGS: Bones/Joint/Cartilage There is a comminuted intertrochanteric fracture of the proximal left femur with angulation and impaction. A large portion of the greater trochanter is avulsed and slightly displaced. There is no dislocation at the hip. Mild arthritic changes of the hip joint with joint space narrowing and small marginal osteophytes on the left femoral head. Visualized pelvic bones are intact. Soft tissues There is hemorrhage in the subcutaneous fat lateral to the left hip as well as some hemorrhage into the adjacent musculature around the  hip. IMPRESSION: Comminuted intertrochanteric fracture of the proximal left femur as described. Electronically Signed   By: Lorriane Shire M.D.   On: 01/29/2019 17:16   Dg Hip Unilat With Pelvis 2-3 Views Left  Result Date: 01/29/2019 CLINICAL DATA:  Fall today. Left hip pain.  Initial encounter. EXAM: DG HIP (WITH OR WITHOUT PELVIS) 2-3V LEFT COMPARISON:  None.  FINDINGS: Mildly impacted and displaced intertrochanteric left hip fracture is seen. No evidence of hip dislocation. Mild left hip osteoarthritis noted. IMPRESSION: Intertrochanteric left hip fracture. Electronically Signed   By: Marlaine Hind M.D.   On: 01/29/2019 14:57        Scheduled Meds: . albuterol  3 mL Inhalation TID  . atorvastatin  20 mg Oral Daily  . azelastine  1 spray Each Nare BID  . feeding supplement (ENSURE ENLIVE)  237 mL Oral TID BM  . feeding supplement  296 mL Oral Once  . folic acid  1 mg Oral Daily  . gabapentin  300 mg Oral QHS  . guaiFENesin  1,200 mg Oral BID  . LORazepam  0-4 mg Oral Q6H   Followed by  . [START ON 01/31/2019] LORazepam  0-4 mg Oral Q12H  . magnesium oxide  200 mg Oral Daily  . multivitamin with minerals  1 tablet Oral Daily  . pantoprazole  40 mg Oral Daily  . povidone-iodine  2 application Topical Once  . senna  1 tablet Oral BID  . thiamine  100 mg Oral Daily   Or  . thiamine  100 mg Intravenous Daily  . umeclidinium-vilanterol  1 puff Inhalation Daily   Continuous Infusions: . sodium chloride 10 mL/hr at 01/29/19 1334  .  ceFAZolin (ANCEF) IV    . methocarbamol (ROBAXIN) IV       LOS: 1 day   Time spent= 40 mins    Byanca Kasper Arsenio Loader, MD [facility]  If 7PM-7AM, please contact night-coverage [email or web address] 01/30/2019, 7:59 AM

## 2019-01-30 NOTE — Transfer of Care (Signed)
Immediate Anesthesia Transfer of Care Note  Patient: Beth Daniels  Procedure(s) Performed: INTRAMEDULLARY (IM) NAIL INTERTROCHANTRIC (Left )  Patient Location: PACU  Anesthesia Type:General  Level of Consciousness: drowsy  Airway & Oxygen Therapy: Patient Spontanous Breathing and Patient connected to nasal cannula oxygen  Post-op Assessment: Report given to RN, Post -op Vital signs reviewed and stable and Patient moving all extremities  Post vital signs: Reviewed and stable  Last Vitals:  Vitals Value Taken Time  BP 122/66 01/30/19 1334  Temp 36.1 C 01/30/19 1335  Pulse 123 01/30/19 1342  Resp 13 01/30/19 1342  SpO2 98 % 01/30/19 1342  Vitals shown include unvalidated device data.  Last Pain:  Vitals:   01/30/19 1335  TempSrc:   PainSc: Asleep         Complications: No apparent anesthesia complications

## 2019-01-30 NOTE — Anesthesia Preprocedure Evaluation (Addendum)
Anesthesia Evaluation  Patient identified by MRN, date of birth, ID band Patient awake    Reviewed: Allergy & Precautions, NPO status , Patient's Chart, lab work & pertinent test results  Airway Mallampati: II  TM Distance: >3 FB Neck ROM: Full    Dental  (+) Edentulous Upper, Missing   Pulmonary COPD (2L at night and prn),  COPD inhaler and oxygen dependent, former smoker,  Sees pulmonologist   Pulmonary exam normal breath sounds clear to auscultation       Cardiovascular + Peripheral Vascular Disease  Normal cardiovascular exam Rhythm:Regular Rate:Normal  ECG: rate 90. Normal sinus rhythm with sinus arrhythmia Right bundle branch block   Neuro/Psych PSYCHIATRIC DISORDERS negative neurological ROS     GI/Hepatic negative GI ROS, (+)     substance abuse  alcohol use,   Endo/Other  hyponatremia  Renal/GU negative Renal ROS     Musculoskeletal negative musculoskeletal ROS (+)   Abdominal   Peds  Hematology  (+) Blood dyscrasia, anemia , HLD   Anesthesia Other Findings Left hip fx  Reproductive/Obstetrics                            Anesthesia Physical Anesthesia Plan  ASA: IV  Anesthesia Plan: General   Post-op Pain Management:    Induction: Intravenous  PONV Risk Score and Plan: 3 and Ondansetron, Dexamethasone and Treatment may vary due to age or medical condition  Airway Management Planned: LMA  Additional Equipment:   Intra-op Plan:   Post-operative Plan: Extubation in OR and Possible Post-op intubation/ventilation  Informed Consent: I have reviewed the patients History and Physical, chart, labs and discussed the procedure including the risks, benefits and alternatives for the proposed anesthesia with the patient or authorized representative who has indicated his/her understanding and acceptance.     Dental advisory given  Plan Discussed with: CRNA  Anesthesia Plan  Comments:        Anesthesia Quick Evaluation

## 2019-01-30 NOTE — Progress Notes (Addendum)
Pt is A&O x4, NPO post mn maint. CHG bath completed. Pt to short stay, report was given to [facility]. I called pt's spouse Levenia Skalicky and given an update. 1515 Received pt from PACU, A&O x4, left hip dressings dry and intact, ice pack in place. I tried calling her spouse but not answering.

## 2019-01-30 NOTE — Progress Notes (Signed)
Pt BP is trending low. Notified [facility]ad on-call MD. Will continue to monitor.

## 2019-01-30 NOTE — Anesthesia Procedure Notes (Signed)
Procedure Name: Intubation Date/Time: 01/30/2019 11:37 AM Performed by: Leonor Liv, CRNA Pre-anesthesia Checklist: Patient identified, Emergency Drugs available, Suction available and Patient being monitored Patient Re-evaluated:Patient Re-evaluated prior to induction Oxygen Delivery Method: Circle System Utilized Preoxygenation: Pre-oxygenation with 100% oxygen Induction Type: IV induction Ventilation: Mask ventilation without difficulty and Oral airway inserted - appropriate to patient size Laryngoscope Size: Mac and 3 Grade View: Grade I Tube type: Oral Tube size: 7.0 mm Number of attempts: 1 Airway Equipment and Method: Stylet and Oral airway Placement Confirmation: ETT inserted through vocal cords under direct vision,  positive ETCO2 and breath sounds checked- equal and bilateral Secured at: 20 cm Tube secured with: Tape Dental Injury: Teeth and Oropharynx as per pre-operative assessment

## 2019-01-30 NOTE — Interval H&P Note (Signed)
History and Physical Interval Note:  01/30/2019 10:43 AM  Beth Daniels  has presented today for surgery, with the diagnosis of Left hip fx.  The various methods of treatment have been discussed with the patient and family. After consideration of risks, benefits and other options for treatment, the patient has consented to  Procedure(s): INTRAMEDULLARY (IM) NAIL INTERTROCHANTRIC (Left) as a surgical intervention.  The patient's history has been reviewed, patient examined, no change in status, stable for surgery.  I have reviewed the patient's chart and labs.  Questions were answered to the patient's satisfaction.     Kerin Salen

## 2019-01-30 NOTE — Op Note (Addendum)
PREOPERATIVE DIAGNOSIS: L hip intertrochanteric fracture 3-part with varus displacement  POSTOPERATIVE DIAGNOSIS: Same  PROCEDURE: Open reduction internal fixation left hip intertrochanteric fracture using a Biomet 373mm x 26mm affixus nail , 100 mm lag screw keyed, 84mm rotation screw SURGEON: Baden Betsch J  ASSISTANT: Gary Fleet PA-C (present throughout entire procedure and necessary for timely completion of the procedure) ANESTHESIA: General  BLOOD LOSS: 300cc  FLUID REPLACEMENT: 1600 cc crystalloid  DRAINS: Foley Catheter  URINE OUTPUT: 329JJ  COMPLICATIONS: none   INDICATIONS FOR PROCEDURE: Lhip intertrochanteric fracture, 3-part, sustained from a fall a few days ago. Patient. presented to the emergency room, was admitted by the medicine service and orthopedic consultation was obtained. To decrease pain and increase function we have recommended open reduction internal fixation using locked trochanteric nail. The risks, benefits, and alternatives were discussed at length including but not limited to the risks of infection, bleeding, nerve injury, stiffness, blood clots, the need for revision surgery, cardiopulmonary complications, among others, and they were willing to proceed. Benefits have been discussed. Questions answered.   PROCEDURE IN DETAIL: The patient was identified by armband,  received preoperative IV antibiotics in the holding area, taken to the operating room , appropriate anesthetic monitors were attached and general endotracheal anesthesia induced.  A Hana boot was placed on the left foot.  Pt. was then transferred to the [facility] table with the right Sparr removed in a free leg holder in place.  The right leg was placed in the free leg holder with padding and Covan used to hold it in place.  Under C-arm imaging control we then performed a closed reduction with traction and internal rotation obtaining an acceptable reduction with the leg in full extension. The left lower  extremity was then prepped and draped in usual sterile fashion in the iliac crest to the proximal tibia. A timeout procedure was performed. Under C-arm image control a line was drawn coaxially with the femur on the lateral view, a stab wound was made 8 cm proximal to the tip of the greater trochanter along the line that had been drawn, and a guide pin was inserted into the tip of the greater trochanter and then down the shaft of the femur pass intertrochanteric fracture. We then placed the ball-tipped guidewire, overreamed with the Biomet pilot reamer and then flexibly reamed up to 12.5 mm obtaining some early chatter at midshaft. With the ball-tipped guidewire in place we measured for 340 mm x 11 mm Biomet affixxus trochanteric nail which was loaded on the inserter placed over the guidewire and down the femur to the appropriate depth that allowed [place] to place the lag bolt into the center of the femoral head, slightly inferior. With the C-arm set for the lateral view we then made it so that the tip of the guide pin bisected the femoral head and inserted the guide pin through the lateral flare of the inner trochanter fracture through the rod and up into the femoral head using the driving platform guide holes. We measured for a 100 mm lag screw and then reamed over the guidewire with the reamer set for 95 mm. We then loaded a 100 mm Biomet affixes lag screw and inserted it through the lateral flare of the femur through the rod and up into the femoral head to a point it was 10 mm below the subchondral bone on the AP and 15 mm on the lateral views.  We then inserted an 80 mm derotation screw just superior to the 80  mm lag screw.  At this point the locking screw was inserted and tightened and then backed off a quarter turn to allow compression. We then directed our attention towards the tip of the rod and using the perfect circle technique inserted a 42 mm locking screw in the proximal of the 2 locking holes distally.  A  46 mm screw was inserted into the slotted screw holes distally on the rod.  Final C-arm images were taken of the construct the hip was taken through flexion and internal and external rotation under image intensification to assure that the lag bolt did not enter the joint. At this point stab wounds and the rod insertion incision were irrigated out normal saline solution, and the subcutaneous tissue closed with 2-0 undyed Vicryl suture and the skin with staples. A dressing of Aquacel was then applied the patient was awakened extubated, transferred to the hospital gurney, and taken to the recovery room without difficulty.  Frederik Pear J  02/12/2012, 7:29 PM

## 2019-01-30 NOTE — Plan of Care (Signed)
  Problem: Education: Goal: Knowledge of General Education information will improve Description: Including pain rating scale, medication(s)/side effects and non-pharmacologic comfort measures Outcome: Progressing   Problem: Clinical Measurements: Goal: Ability to maintain clinical measurements within normal limits will improve Outcome: Progressing Goal: Will remain free from infection Outcome: Progressing Goal: Diagnostic test results will improve Outcome: Progressing Goal: Respiratory complications will improve Outcome: Progressing Goal: Cardiovascular complication will be avoided Outcome: Progressing   Problem: Activity: Goal: Risk for activity intolerance will decrease Outcome: Progressing   Problem: Nutrition: Goal: Adequate nutrition will be maintained Outcome: Progressing   Problem: Safety: Goal: Ability to remain free from injury will improve Outcome: Progressing   Problem: Skin Integrity: Goal: Risk for impaired skin integrity will decrease Outcome: Progressing

## 2019-01-30 NOTE — Progress Notes (Signed)
PT Cancellation Note  Patient Details Name: Beth Daniels MRN: [id number] DOB: 11-Sep-1945   Cancelled Treatment:    Reason Eval/Treat Not Completed: Patient not medically ready, going for surgery today. Will await new orders and WB status.   Leighton Roach, [place]  Pager [phone] Office [place] 01/30/2019, 7:58 AM

## 2019-01-31 ENCOUNTER — Encounter (HOSPITAL_COMMUNITY): Payer: Self-pay | Admitting: Orthopedic Surgery

## 2019-01-31 ENCOUNTER — Inpatient Hospital Stay (HOSPITAL_COMMUNITY): Payer: Medicare Other

## 2019-01-31 ENCOUNTER — Telehealth: Payer: Self-pay

## 2019-01-31 LAB — CBC
HCT: 16.7 % — ABNORMAL LOW (ref 36.0–46.0)
Hemoglobin: 5.5 g/dL — CL (ref 12.0–15.0)
MCH: 34.8 pg — ABNORMAL HIGH (ref 26.0–34.0)
MCHC: 32.9 g/dL (ref 30.0–36.0)
MCV: 105.7 fL — ABNORMAL HIGH (ref 80.0–100.0)
Platelets: 166 10*3/uL (ref 150–400)
RBC: 1.58 MIL/uL — ABNORMAL LOW (ref 3.87–5.11)
RDW: 15 % (ref 11.5–15.5)
WBC: 8.4 10*3/uL (ref 4.0–10.5)
nRBC: 0 % (ref 0.0–0.2)

## 2019-01-31 LAB — COMPREHENSIVE METABOLIC PANEL
ALT: 13 U/L (ref 0–44)
AST: 19 U/L (ref 15–41)
Albumin: 2.9 g/dL — ABNORMAL LOW (ref 3.5–5.0)
Alkaline Phosphatase: 53 U/L (ref 38–126)
Anion gap: 9 (ref 5–15)
BUN: 7 mg/dL — ABNORMAL LOW (ref 8–23)
CO2: 28 mmol/L (ref 22–32)
Calcium: 7.9 mg/dL — ABNORMAL LOW (ref 8.9–10.3)
Chloride: 92 mmol/L — ABNORMAL LOW (ref 98–111)
Creatinine, Ser: 0.45 mg/dL (ref 0.44–1.00)
GFR calc Af Amer: >60 mL/min (ref >60)
GFR calc non Af Amer: >60 mL/min (ref >60)
Glucose, Bld: 130 mg/dL — ABNORMAL HIGH (ref 70–99)
Potassium: 4.6 mmol/L (ref 3.5–5.1)
Sodium: 129 mmol/L — ABNORMAL LOW (ref 135–145)
Total Bilirubin: 0.6 mg/dL (ref 0.3–1.2)
Total Protein: 5.1 g/dL — ABNORMAL LOW (ref 6.5–8.1)

## 2019-01-31 LAB — MAGNESIUM: Magnesium: 1.8 mg/dL (ref 1.7–2.4)

## 2019-01-31 LAB — VITAMIN D 25 HYDROXY (VIT D DEFICIENCY, FRACTURES): Vit D, 25-Hydroxy: <4 ng/mL — ABNORMAL LOW (ref 30.0–100.0)

## 2019-01-31 LAB — HEMOGLOBIN AND HEMATOCRIT, BLOOD
HCT: 26.9 % — ABNORMAL LOW (ref 36.0–46.0)
Hemoglobin: 9.1 g/dL — ABNORMAL LOW (ref 12.0–15.0)

## 2019-01-31 LAB — PREPARE RBC (CROSSMATCH)

## 2019-01-31 MED ORDER — BUDESONIDE 0.5 MG/2ML IN SUSP
0.5000 mg | Freq: Two times a day (BID) | RESPIRATORY_TRACT | Status: DC
  Filled 2019-01-31: qty 2

## 2019-01-31 MED ORDER — VITAMIN D (ERGOCALCIFEROL) 1.25 MG (50000 UNIT) PO CAPS
50000.0000 [IU] | ORAL_CAPSULE | ORAL | Status: DC
  Administered 2019-01-31: 50000 [IU] via ORAL
  Filled 2019-01-31: qty 1

## 2019-01-31 MED ORDER — SODIUM CHLORIDE 0.9% IV SOLUTION
Freq: Once | INTRAVENOUS | Status: AC
  Administered 2019-01-31: 08:00:00 via INTRAVENOUS

## 2019-01-31 MED ORDER — IPRATROPIUM-ALBUTEROL 0.5-2.5 (3) MG/3ML IN SOLN: 3.0000 mL | RESPIRATORY_TRACT | Status: DC | PRN

## 2019-01-31 MED ORDER — IPRATROPIUM-ALBUTEROL 0.5-2.5 (3) MG/3ML IN SOLN
3.0000 mL | Freq: Four times a day (QID) | RESPIRATORY_TRACT | Status: DC
  Administered 2019-01-31: 3 mL via RESPIRATORY_TRACT
  Filled 2019-01-31: qty 3

## 2019-01-31 MED ORDER — IPRATROPIUM-ALBUTEROL 0.5-2.5 (3) MG/3ML IN SOLN
3.0000 mL | Freq: Two times a day (BID) | RESPIRATORY_TRACT | Status: DC
  Filled 2019-01-31: qty 3

## 2019-01-31 NOTE — Progress Notes (Signed)
PATIENT ID: Beth Daniels  MRN: [id number]  DOB/AGE:  July 13, 1945 / 73 y.o.  1 Day Post-Op Procedure(s) (LRB): INTRAMEDULLARY (IM) NAIL INTERTROCHANTRIC (Left)    PROGRESS NOTE Subjective:   Patient is alert, oriented, no Nausea, no Vomiting, yes passing gas, no Bowel Movement. Taking PO well with pt up in bed drinking water. Denies SOB, Chest or Calf Pain. Using Incentive Spirometer, PAS in place. Ambulate 50% weight bearing to left lower leg, Patient reports pain as moderate    Objective: Vital signs in last 24 hours: Temp:  [97 F (36.1 C)-98.4 F (36.9 C)] 98.1 F (36.7 C) (07/27 0324) Pulse Rate:  [87-128] 90 (07/27 0324) Resp:  [15-21] 16 (07/26 1519) BP: (101-123)/(50-78) 101/63 (07/27 0324) SpO2:  [92 %-100 %] 93 % (07/27 0324) Weight:  [40 kg] 40 kg (07/26 1515)    Intake/Output from previous day: I/O last 3 completed shifts: In: 2637 [P.O.:290; I.V.:2097; IV Piggyback:250] Out: 850 [Urine:700; Blood:150]   Intake/Output this shift: No intake/output data recorded.   LABORATORY DATA: Recent Labs    01/29/19 1354 01/30/19 0312 01/31/19 0336  WBC 4.6 7.7 8.4  HGB 11.6* 8.2* 5.5*  HCT 34.7* 23.9* 16.7*  PLT 279 219 166  NA 132* 127* 129*  K 4.1 5.2* 4.6  CL 96* 94* 92*  CO2 21* 24 28  BUN <5* 12 7*  CREATININE 0.36* 0.81 0.45  GLUCOSE 72 165* 130*  INR 1.0  --   --   CALCIUM 8.4* 8.4* 7.9*    Examination: Neurologically intact Neurovascular intact Sensation intact distally Intact pulses distally Dorsiflexion/Plantar flexion intact Incision: dressing C/D/I and scant drainage No cellulitis present Compartment soft}  Assessment:   1 Day Post-Op Procedure(s) (LRB): INTRAMEDULLARY (IM) NAIL INTERTROCHANTRIC (Left) ADDITIONAL DIAGNOSIS:  Acute Blood Loss Anemia, Renal Insufficiency Acute and PVD, Alcohol dependence  Plan:  Partial Weight Bearing @ 50% (PWB)  DVT Prophylaxis:  Aspirin and plavix  DISCHARGE PLAN: Home, when medically stable.  DISCHARGE  NEEDS: HHPT, Walker and 3-in-1 comode seat  Hgb low and medicine ordered 2 units of PRBC's.    Joanell Rising 01/31/2019, 7:42 AM

## 2019-01-31 NOTE — Plan of Care (Signed)

## 2019-01-31 NOTE — Progress Notes (Signed)
Started 1 unit PRBC transfusion, vital signs taken and recorded, no adverse reactions noted, will continue to monitor.

## 2019-01-31 NOTE — Progress Notes (Signed)
Pt completed 1st unit of PRBC, tolerated well. Started 2nd unit PRBC transfusion, vital signs taken and recorded, no adverse reactions noted, will continue to monitor.

## 2019-01-31 NOTE — Telephone Encounter (Signed)
Patient in hospital notified also will contact husband

## 2019-01-31 NOTE — Progress Notes (Signed)
CRITICAL VALUE ALERT  Critical Value:  Hgb 5.5   Date & Time Notied:  7/27 @ 7357  Provider Notified: [facility]  Orders Received/Actions taken: 2 PRBC ordered

## 2019-01-31 NOTE — Progress Notes (Signed)
OT Cancellation Note  Patient Details Name: Beth Daniels MRN: [id number] DOB: Dec 01, 1945   Cancelled Treatment:    Reason Eval/Treat Not Completed: Patient not medically ready. Pt with Hgb of 5.5, has not yet started getting blood. Will monitor for appropriateness of eval.  [place], OTR/L Acute Rehab Services Pager [phone] Office [phone]     Almon Register 01/31/2019, 8:00 AM

## 2019-01-31 NOTE — Evaluation (Addendum)
3Occupational Therapy Evaluation Patient Details Name: Beth Daniels MRN: [id number] DOB: 12-Jul-1945 Today's Date: 01/31/2019    History of Present Illness Pt is 73 yo female who presents after mechanical fall in bathroom with L elbow pain (no fx) and L hip pain with intertrochanteric fx. Plan is for sx on 7/26. PMH: breast cancer with mastectomy, COPD, PVD, alcohol induced peripheral neuropathy   Clinical Impression   This 73 yo female admitted and underwent above presents to acute OT with decreased balance, decreased mobility, increased pain, and PWB'ing LLE all affecting her safety and independence with basic ADls and IADLs with pt normally being Independent. Pt will benefit from acute OT with follow up [place]. (unless progression slower and then will need SNF)    Follow Up Recommendations  Home health OT;Supervision/Assistance - 24 hour(if doesn't progress well may need SNF)    Equipment Recommendations  3 in 1 bedside commode       Precautions / Restrictions Precautions Precautions: Fall Restrictions Weight Bearing Restrictions: Yes LLE Weight Bearing: Partial weight bearing LLE Partial Weight Bearing Percentage or Pounds: 50      Mobility Bed Mobility Overal bed mobility: Needs Assistance Bed Mobility: Supine to Sit     Supine to sit: Min assist;+2 for physical assistance     General bed mobility comments: max directional verbal cues for long sit, minA for trunk elevation and L LE management off EOB  Transfers Overall transfer level: Needs assistance Equipment used: Rolling walker (2 wheeled) Transfers: Sit to/from Stand Sit to Stand: Min assist         General transfer comment: verbal cues for hand placment, minA to power up, minimal L LE WBing    Balance Overall balance assessment: Needs assistance Sitting-balance support: Feet supported;Bilateral upper extremity supported Sitting balance-Leahy Scale: Poor Sitting balance - Comments: pt requires bilat UE support to  elevate off L side of hip   Standing balance support: Bilateral upper extremity supported Standing balance-Leahy Scale: Poor Standing balance comment: dependent on RW                           ADL either performed or assessed with clinical judgement   ADL Overall ADL's : Needs assistance/impaired Eating/Feeding: Independent;Sitting   Grooming: Set up;Sitting   Upper Body Bathing: Set up;Sitting   Lower Body Bathing: Moderate assistance Lower Body Bathing Details (indicate cue type and reason): min A sit<>stand Upper Body Dressing : Set up;Sitting   Lower Body Dressing: Maximal assistance Lower Body Dressing Details (indicate cue type and reason): min A sit<>stand Toilet Transfer: Minimal assistance;+2 for safety/equipment;RW;BSC Toilet Transfer Details (indicate cue type and reason): over toilet Toileting- Clothing Manipulation and Hygiene: Minimal assistance;Sit to/from stand               Vision Baseline Vision/History: Wears glasses Wears Glasses: At all times Patient Visual Report: No change from baseline              Pertinent Vitals/Pain Pain Assessment: 0-10 Pain Score: 10-Worst pain ever Pain Location: left leg (when move) Pain Descriptors / Indicators: Sharp Pain Intervention(s): Limited activity within patient's tolerance;Repositioned     Hand Dominance Right   Extremity/Trunk Assessment Upper Extremity Assessment Upper Extremity Assessment: Overall WFL for tasks assessed   Lower Extremity Assessment Lower Extremity Assessment: LLE deficits/detail LLE Deficits / Details: able to initiated quad set, very guarded and minimal active movement, able to bend knee to 90 in chair at end of sesion  Cervical / Trunk Assessment Cervical / Trunk Assessment: Normal   Communication Communication Communication: No difficulties   Cognition Arousal/Alertness: Awake/alert Behavior During Therapy: Anxious Overall Cognitive Status: Within Functional  Limits for tasks assessed                                 General Comments: pt very anxious regarding onset of pain with movement however becomes impulsive with movement   General Comments  assist into bathroom, OT assisted with tolieting    Exercises Exercises: General Lower Extremity General Exercises - Lower Extremity Ankle Circles/Pumps: AROM;Both;10 reps;Supine Quad Sets: AROM;Left;10 reps;Supine(required max encouragement due to pain)   Shoulder Instructions      Home Living Family/patient expects to be discharged to:: Private residence Living Arrangements: Spouse/significant other Available Help at Discharge: Family;Available 24 hours/day Type of Home: House Home Access: Stairs to enter [employer] of Steps: 3 Entrance Stairs-Rails: Left(going up  steps) Home Layout: One level     Bathroom Shower/Tub: Walk-in shower;Door   [employer] Toilet: Standard     Home Equipment: Grab bars - tub/shower;Cane - single point    Home O2      Prior Functioning/Environment Level of Independence: Independent                 OT Problem List: Decreased strength;Decreased range of motion;Impaired balance (sitting and/or standing);Pain      OT Treatment/Interventions: Self-care/ADL training;DME and/or AE instruction;Patient/family education;Balance training    OT Goals(Current goals can be found in the care plan section) Acute Rehab OT Goals Patient Stated Goal: to go home OT Goal Formulation: With patient Time For Goal Achievement: 02/14/19 Potential to Achieve Goals: Good  OT Frequency: Min 2X/week           Co-evaluation PT/OT/SLP Co-Evaluation/Treatment: Yes Reason for Co-Treatment: Necessary to address cognition/behavior during functional activity;To address functional/ADL transfers PT goals addressed during session: Mobility/safety with mobility OT goals addressed during session: ADL's and self-care;Strengthening/ROM      AM-PAC OT  "6 Clicks" Daily Activity     Outcome Measure Help from another person eating meals?: None Help from another person taking care of personal grooming?: A Little Help from another person toileting, which includes using toliet, bedpan, or urinal?: A Little Help from another person bathing (including washing, rinsing, drying)?: A Lot Help from another person to put on and taking off regular upper body clothing?: A Little Help from another person to put on and taking off regular lower body clothing?: A Lot 6 Click Score: 17   End of Session Equipment Utilized During Treatment: Gait belt;Rolling walker Nurse Communication: Mobility status(NT too)  Activity Tolerance: Patient tolerated treatment well Patient left: in chair;with call bell/phone within reach  OT Visit Diagnosis: Unsteadiness on feet (R26.81);Other abnormalities of gait and mobility (R26.89);History of falling (Z91.81);Pain Pain - Right/Left: Left Pain - part of body: Leg                Time: 12:30-13:12 OT Time Calculation (min): 42 min Charges:  OT General Charges $OT Visit: 1 Visit OT Evaluation $OT Eval Moderate Complexity: [place], OTR/L Acute [employer] Pager [phone] Office [phone]     Almon Register 01/31/2019, 3:16 PM

## 2019-01-31 NOTE — Evaluation (Signed)
Physical Therapy Evaluation Patient Details Name: Beth Daniels MRN: [id number] DOB: 10-14-1945 Today's Date: 01/31/2019   History of Present Illness  Pt is 73 yo female who presents after mechanical fall in bathroom with L elbow pain (no fx) and L hip pain with intertrochanteric fx. Plan is for sx on 7/26. PMH: breast cancer with mastectomy, COPD, PVD, alcohol induced peripheral neuropathy  Clinical Impression  Pt admitted with above. Pt extremely anxious in regard to onset of pain and pain in general, however pt functioning at [facility] and mildly impulsive trying to get it done and over with. Anticipate once pain under control and nerves under better management she will progress quickly. Pt given HEP for L LE. Acute PT to con't to follow.    Follow Up Recommendations Home health PT;Supervision/Assistance - 24 hour(may need SNF if doesn't progress to supervision)    Equipment Recommendations  Rolling walker with 5" wheels;3in1 (PT)    Recommendations for Other Services       Precautions / Restrictions Precautions Precautions: Fall Restrictions Weight Bearing Restrictions: Yes LLE Weight Bearing: Partial weight bearing LLE Partial Weight Bearing Percentage or Pounds: 50      Mobility  Bed Mobility Overal bed mobility: Needs Assistance Bed Mobility: Supine to Sit     Supine to sit: Min assist;+2 for physical assistance     General bed mobility comments: max directional verbal cues for long sit, minA for trunk elevation and L LE management off EOB  Transfers Overall transfer level: Needs assistance Equipment used: Rolling walker (2 wheeled) Transfers: Sit to/from Stand Sit to Stand: Min assist         General transfer comment: verbal cues for hand placment, minA to power up, minimal L LE WBing  Ambulation/Gait Ambulation/Gait assistance: Min assist;+2 safety/equipment Gait Distance (Feet): 12 Feet(x2, to/from bathroom) Assistive device: Rolling walker (2 wheeled) Gait  Pattern/deviations: Decreased stride length;Step-to pattern;Decreased weight shift to left;Decreased stance time - left Gait velocity: impulsively fast but overall decreased compared to normal   General Gait Details: pt only WBing through ball of L foot, pt with minimal WBing due to pain, verbal cues for sequencing stepping with walker  Stairs            Wheelchair Mobility    Modified Rankin (Stroke Patients Only)       Balance Overall balance assessment: Needs assistance Sitting-balance support: Feet supported;Bilateral upper extremity supported Sitting balance-Leahy Scale: Poor Sitting balance - Comments: pt requires bilat UE support to elevate off L side of hip   Standing balance support: Bilateral upper extremity supported Standing balance-Leahy Scale: Poor Standing balance comment: dependent on RW                             Pertinent Vitals/Pain Pain Assessment: 0-10 Pain Score: 10-Worst pain ever Pain Location: left leg (when move) Pain Descriptors / Indicators: Sharp Pain Intervention(s): Limited activity within patient's tolerance    Home Living Family/patient expects to be discharged to:: Private residence Living Arrangements: Spouse/significant other Available Help at Discharge: Family;Available 24 hours/day Type of Home: House Home Access: Stairs to enter Entrance Stairs-Rails: Left(going up  steps) Entrance Stairs-Number of Steps: 3 Home Layout: One level Home Equipment: Grab bars - tub/shower;Cane - single point      Prior Function Level of Independence: Independent               Hand Dominance   Dominant Hand: Right    Extremity/Trunk  Assessment   Upper Extremity Assessment Upper Extremity Assessment: Defer to OT evaluation    Lower Extremity Assessment Lower Extremity Assessment: LLE deficits/detail LLE Deficits / Details: able to initiated quad set, very guarded and minimal active movement, able to bend knee to 90 in  chair at end of sesion    Cervical / Trunk Assessment Cervical / Trunk Assessment: Normal  Communication   Communication: No difficulties  Cognition Arousal/Alertness: Awake/alert Behavior During Therapy: Anxious Overall Cognitive Status: Within Functional Limits for tasks assessed                                 General Comments: pt very anxious regarding onset of pain with movement however becomes impulsive with movement      General Comments General comments (skin integrity, edema, etc.): assist into bathroom, OT assisted with tolieting    Exercises General Exercises - Lower Extremity Ankle Circles/Pumps: AROM;Both;10 reps;Supine Quad Sets: AROM;Left;10 reps;Supine(required max encouragement due to pain)   Assessment/Plan    PT Assessment Patient needs continued PT services  PT Problem List Decreased strength;Decreased range of motion;Decreased activity tolerance;Decreased balance;Decreased mobility;Decreased coordination;Decreased cognition;Decreased knowledge of use of DME;Decreased safety awareness       PT Treatment Interventions DME instruction;Gait training;Stair training;Functional mobility training;Therapeutic activities;Therapeutic exercise;Balance training;Neuromuscular re-education    PT Goals (Current goals can be found in the Care Plan section)  Acute Rehab PT Goals Patient Stated Goal: home PT Goal Formulation: With patient Time For Goal Achievement: 02/14/19 Potential to Achieve Goals: Good    Frequency Min 5X/week   Barriers to discharge        Co-evaluation PT/OT/SLP Co-Evaluation/Treatment: Yes Reason for Co-Treatment: Necessary to address cognition/behavior during functional activity PT goals addressed during session: Mobility/safety with mobility         AM-PAC PT "6 Clicks" Mobility  Outcome Measure Help needed turning from your back to your side while in a flat bed without using bedrails?: A Lot Help needed moving from  lying on your back to sitting on the side of a flat bed without using bedrails?: A Lot Help needed moving to and from a bed to a chair (including a wheelchair)?: A Lot Help needed standing up from a chair using your arms (e.g., wheelchair or bedside chair)?: A Lot Help needed to walk in hospital room?: A Lot Help needed climbing 3-5 steps with a railing? : A Lot 6 Click Score: 12    End of Session Equipment Utilized During Treatment: Gait belt;Oxygen(3Lo2 via [place]) Activity Tolerance: Patient limited by pain(and anxiety) Patient left: in chair;with call bell/phone within reach;with nursing/sitter in room Nurse Communication: Mobility status PT Visit Diagnosis: Unsteadiness on feet (R26.81);Pain Pain - Right/Left: Left Pain - part of body: Leg    Time: 2549-8264 PT Time Calculation (min) (ACUTE ONLY): 34 min   Charges:   PT Evaluation $PT Eval Moderate Complexity: 1 Mod          Kittie Plater, PT, DPT Acute Rehabilitation Services Pager #: [phone] Office #: [phone]   Berline Lopes 01/31/2019, 2:29 PM

## 2019-01-31 NOTE — Progress Notes (Signed)
Initial Nutrition Assessment  DOCUMENTATION CODES:   Underweight  INTERVENTION:  Continue Ensure Enlive po TID, each supplement provides 350 kcal and 20 grams of protein.  Continue multivitamin once daily.   Encourage adequate PO intake.   NUTRITION DIAGNOSIS:   Increased nutrient needs related to chronic illness(COPD) as evidenced by estimated needs.  GOAL:   Patient will meet greater than or equal to 90% of their needs  MONITOR:   PO intake, Supplement acceptance, Skin, Weight trends, Labs, I & O's  REASON FOR ASSESSMENT:   Consult Assessment of nutrition requirement/status  ASSESSMENT:   73 year old female with history of COPD on home oxygen at night, emphysema, peripheral vascular disease presents after fall. Pt with impacted mildly displaced 4 part intertrochanteric fracture of the left hip.  Procedure(7/26): INTRAMEDULLARY (IM) NAIL INTERTROCHANTRIC (Left )  Meal completion has been 100%. Pt reports consuming at least 2-3 meals a day, however reports portion sizes at meals have gradually decreased over the past couple of years due to dentures and chewing difficulties. Pt has been trying to consuming soft and/or full liquids prior to admission. Pt reports consuming carnation instant breakfast daily. Usual body weight unknown. Pt agitated throughout assessment and requested to rest. Pt currently has Ensure ordered and has been consuming them. Pt educated on the importance of increased caloric and protein needs.   Unable to complete Nutrition-Focused physical exam at this time. Pt agitated and requesting to rest.   Labs and medications reviewed.   Diet Order:   Diet Order            Diet 2 gram sodium Room service appropriate? Yes; Fluid consistency: Thin  Diet effective now              EDUCATION NEEDS:   Education needs have been addressed  Skin:  Skin Assessment: Skin Integrity Issues: Skin Integrity Issues:: Incisions Incisions: L hip  Last BM:   7/25  Height:   Ht Readings from Last 1 Encounters:  01/30/19 5\' 2"  (1.575 m)    Weight:   Wt Readings from Last 1 Encounters:  01/30/19 40 kg    Ideal Body Weight:  50 kg  BMI:  Body mass index is 16.13 kg/m.  Estimated Nutritional Needs:   Kcal:  3748-2707  Protein:  60-70 grams  Fluid:  >/= 1.5 L/day    Corrin Parker, MS, RD, LDN Pager # [phone] After hours/ weekend pager # [phone]

## 2019-01-31 NOTE — Progress Notes (Signed)
Pt completed 2nd unit of PRBC, vital signs taken and recorded, tolerated well, no adverse reactions noted.

## 2019-01-31 NOTE — Telephone Encounter (Signed)
-----   Message from [name], DO sent at 01/31/2019  7:54 AM EDT ----- Please inform patient that her thiamine (vitamin B1) is very low and to start vitamin B1 100mg  daily.  Thanks.

## 2019-01-31 NOTE — Progress Notes (Signed)
PT Cancellation Note  Patient Details Name: Beth Daniels MRN: [id number] DOB: 14-Nov-1945   Cancelled Treatment:    Reason Eval/Treat Not Completed: Patient not medically ready. Pt Hgb at 5.5. Per RN, pt is going to get 2 units of blood but hasn't started transfusion yet. PT to await transfusion and re-assess appropriateness for mobility progression/PT eval as able.  Kittie Plater, PT, DPT Acute Rehabilitation Services Pager #: [phone] Office #: [phone]    Berline Lopes 01/31/2019, 8:25 AM

## 2019-01-31 NOTE — Progress Notes (Addendum)
PROGRESS NOTE    Beth Daniels  PYP:[id number] DOB: 1946/01/05 DOA: 01/29/2019 PCP: Shon Baton, MD   Brief Narrative:  73 year old with history of osteoporosis, breast cancer status post mastectomy, chronic hyponatremia, COPD with chronic hypoxia on 2 L nasal cannula, alcohol abuse, peripheral vascular disease status post stent placement in the left lower extremity presented with mechanical fall due to poor gait from alcohol-induced neuropathy.  Was found to have left hip fracture.  Orthopedic was consulted.  Patient underwent intramedullary nailing of her left hip on 7/26.  Postoperatively developed anemia, likely blood loss therefore units PRBC transfusion ordered.  She was noted to be deficient in vitamin D and thiamine.   Assessment & Plan:   Principal Problem:   Closed left hip fracture (HCC) Active Problems:   COPD (chronic obstructive pulmonary disease) (HCC)   PVD (peripheral vascular disease) (HCC)   Alcohol dependence ([place])   Severe protein-calorie malnutrition (HCC)   AKI (acute kidney injury) ([place])  Mechanical fall leading to left intertrochanteric impacted fracture with mild displacement, closed Vitamin D deficiency -Plan for surgical intervention per orthopedic. -Pain control PT/OT-suspect she will require skilled nursing facility -Bowel regimen, incentive spirometer.   - Start vitamin D supplements - Orthopedic recommends DVT prophylaxis- aspirin and Plavix.  Discontinued Foley catheter.  Acute blood loss anemia Chronic macrocytic anemia -Hemoglobin down to 5.5, 2 units PRBC ordered.  Iron studies are stable. -Borderline low ferritin therefore iron supplement started.  Abnormal breath sounds suspect COPD exacerbation Chronic hypoxia on 2 L nasal cannula - Order scheduled and as needed bronchodilators.  Pulmicort twice daily. -Incentive spirometer and flutter valve. -check BNP and CXR  Acute kidney injury, resolved -Baseline creatinine 0.3, trended up to 0.8.  Has  trended down to 0.45.  Continue gentle hydration.  Hyperkalemia, resolved - Repeat EKG this morning-no peaked T waves noted. -Admission EKG stable  History of peripheral vascular disease status post stent placement 3 years ago and left lower extremity -Plavix on hold due to surgical intervention planned today.  Hyperlipidemia -On statin.  Alcohol-induced neuropathy Alcohol dependence -On gabapentin. -Alcohol withdrawal protocol. -Multivitamin, folate and thiamine ordered.  Severe protein calorie malnutrition -Dietitian consulted.  Chronic hyponatremia - Likely due to underlying alcohol use.  We will continue to monitor sodium levels.  History of osteoporosis -Receives outpatient Prolia.   DVT prophylaxis: SCDs Code Status: Full code Family Communication: None at bedside Disposition Plan: Maintain hospital stay until hemoglobin has remained stable and breathing has stabilized.  Currently she is inpatient appropriate.  Consultants:   Orthopedic  Procedures:   None so far  Antimicrobials:   Perioperative   Subjective: Patient reports of some exertional shortness of breath and coughing up slight mucus.  Denies any other complaints.  Review of Systems Otherwise negative except as per HPI, including: General = no fevers, chills, dizziness, malaise, fatigue HEENT/EYES = negative for pain, redness, loss of vision, double vision, blurred vision, loss of hearing, sore throat, hoarseness, dysphagia Cardiovascular= negative for chest pain, palpitation, murmurs, lower extremity swelling Respiratory/lungs= negative for shortness of breath, cough, hemoptysis, wheezing, mucus production Gastrointestinal= negative for nausea, vomiting,, abdominal pain, melena, hematemesis Genitourinary= negative for Dysuria, Hematuria, Change in Urinary Frequency MSK = Negative for arthralgia, myalgias, Back Pain, Joint swelling  Neurology= Negative for headache, seizures, numbness, tingling   Psychiatry= Negative for anxiety, depression, suicidal and homocidal ideation Allergy/Immunology= Medication/Food allergy as listed  Skin= Negative for Rash, lesions, ulcers, itching  Objective: Vitals:   01/31/19 0849 01/31/19 1105 01/31/19 1148  01/31/19 1213  BP: (!) 107/55 (!) 108/54 (!) 104/56 (!) 99/44  Pulse: 100 88 89 91  Resp: 15 15 15 16   Temp: 99.3 F (37.4 C) 99.4 F (37.4 C) 99.2 F (37.3 C) 98.6 F (37 C)  TempSrc: Oral Oral Oral Oral  SpO2: 97% 100% 100% 98%  Weight:      Height:        Intake/Output Summary (Last 24 hours) at 01/31/2019 1259 Last data filed at 01/31/2019 1009 Gross per 24 hour  Intake 2992.87 ml  Output 700 ml  Net 2292.87 ml   Filed Weights   01/30/19 1515  Weight: 40 kg    Examination:  Constitutional: NAD, calm, comfortable, 2 L nasal cannula. Eyes: PERRL, lids and conjunctivae normal ENMT: Mucous membranes are moist. Posterior pharynx clear of any exudate or lesions.Normal dentition.  Neck: normal, supple, no masses, no thyromegaly Respiratory: Diffuse rhonchi Cardiovascular: Regular rate and rhythm, no murmurs / rubs / gallops. No extremity edema. 2+ pedal pulses. No carotid bruits.  Abdomen: no tenderness, no masses palpated. No hepatosplenomegaly. Bowel sounds positive.  Musculoskeletal: no clubbing / cyanosis. No joint deformity upper and lower extremities. Good ROM, no contractures. Normal muscle tone.  Skin: Left hip dressing noted without any evidence of obvious bleeding. Neurologic: CN 2-12 grossly intact. Sensation intact, DTR normal. Strength 5/5 in all 4.  Psychiatric: Normal judgment and insight. Alert and oriented x 3. Normal mood.   Data Reviewed:   CBC: Recent Labs  Lab 01/29/19 1354 01/30/19 0312 01/31/19 0336  WBC 4.6 7.7 8.4  NEUTROABS 3.5  --   --   HGB 11.6* 8.2* 5.5*  HCT 34.7* 23.9* 16.7*  MCV 104.2* 101.3* 105.7*  PLT 279 219 948   Basic Metabolic Panel: Recent Labs  Lab 01/29/19 1354  01/30/19 0312 01/31/19 0336  NA 132* 127* 129*  K 4.1 5.2* 4.6  CL 96* 94* 92*  CO2 21* 24 28  GLUCOSE 72 165* 130*  BUN <5* 12 7*  CREATININE 0.36* 0.81 0.45  CALCIUM 8.4* 8.4* 7.9*  MG  --   --  1.8   GFR: Estimated Creatinine Clearance: 40.1 mL/min (by C-G formula based on SCr of 0.45 mg/dL). Liver Function Tests: Recent Labs  Lab 01/31/19 0336  AST 19  ALT 13  ALKPHOS 53  BILITOT 0.6  PROT 5.1*  ALBUMIN 2.9*   No results for input(s): LIPASE, AMYLASE in the last 168 hours. No results for input(s): AMMONIA in the last 168 hours. Coagulation Profile: Recent Labs  Lab 01/29/19 1354  INR 1.0   Cardiac Enzymes: Recent Labs  Lab 01/30/19 0923  CKTOTAL 46   BNP (last 3 results) No results for input(s): PROBNP in the last 8760 hours. HbA1C: No results for input(s): HGBA1C in the last 72 hours. CBG: No results for input(s): GLUCAP in the last 168 hours. Lipid Profile: No results for input(s): CHOL, HDL, LDLCALC, TRIG, CHOLHDL, LDLDIRECT in the last 72 hours. Thyroid Function Tests: Recent Labs    01/30/19 0923  TSH 2.703   Anemia Panel: Recent Labs    01/30/19 0923  FERRITIN 49  TIBC 210*  IRON 29   Sepsis Labs: No results for input(s): PROCALCITON, LATICACIDVEN in the last 168 hours.  Recent Results (from the past 240 hour(s))  SARS Coronavirus 2 (CEPHEID - Performed in [place] hospital lab), Hosp Order     Status: None   Collection Time: 01/29/19  3:20 PM   Specimen: Nasopharyngeal Swab  Result Value  Ref Range Status   SARS Coronavirus 2 NEGATIVE NEGATIVE Final    Comment: (NOTE) If result is NEGATIVE SARS-CoV-2 target nucleic acids are NOT DETECTED. The SARS-CoV-2 RNA is generally detectable in upper and lower  respiratory specimens during the acute phase of infection. The lowest  concentration of SARS-CoV-2 viral copies this assay can detect is 250  copies / mL. A negative result does not preclude SARS-CoV-2 infection  and should not be  used as the sole basis for treatment or other  patient management decisions.  A negative result may occur with  improper specimen collection / handling, submission of specimen other  than nasopharyngeal swab, presence of viral mutation(s) within the  areas targeted by this assay, and inadequate number of viral copies  (<250 copies / mL). A negative result must be combined with clinical  observations, patient history, and epidemiological information. If result is POSITIVE SARS-CoV-2 target nucleic acids are DETECTED. The SARS-CoV-2 RNA is generally detectable in upper and lower  respiratory specimens dur ing the acute phase of infection.  Positive  results are indicative of active infection with SARS-CoV-2.  Clinical  correlation with patient history and other diagnostic information is  necessary to determine patient infection status.  Positive results do  not rule out bacterial infection or co-infection with other viruses. If result is PRESUMPTIVE POSTIVE SARS-CoV-2 nucleic acids MAY BE PRESENT.   A presumptive positive result was obtained on the submitted specimen  and confirmed on repeat testing.  While 2019 novel coronavirus  (SARS-CoV-2) nucleic acids may be present in the submitted sample  additional confirmatory testing may be necessary for epidemiological  and / or clinical management purposes  to differentiate between  SARS-CoV-2 and other Sarbecovirus currently known to infect humans.  If clinically indicated additional testing with an alternate test  methodology [id number]) is advised. The SARS-CoV-2 RNA is generally  detectable in upper and lower respiratory sp ecimens during the acute  phase of infection. The expected result is Negative. Fact Sheet for Patients:  StrictlyIdeas.no Fact Sheet for Healthcare Providers: BankingDealers.co.za This test is not yet approved or cleared by the [place] FDA and has been authorized  for detection and/or diagnosis of SARS-CoV-2 by FDA under an Emergency Use Authorization (EUA).  This EUA will remain in effect (meaning this test can be used) for the duration of the COVID-19 declaration under Section 564(b)(1) of the Act, 21 U.S.C. section 360bbb-3(b)(1), unless the authorization is terminated or revoked sooner. Performed at [facility], [place] [address], [place], [place] [address]   Surgical pcr screen     Status: None   Collection Time: 01/30/19  6:20 AM   Specimen: Nasal Mucosa; Nasal Swab  Result Value Ref Range Status   MRSA, PCR NEGATIVE NEGATIVE Final   Staphylococcus aureus NEGATIVE NEGATIVE Final    Comment: (NOTE) The Xpert SA Assay (FDA approved for NASAL specimens in patients 3 years of age and older), is one component of a comprehensive surveillance program. It is not intended to diagnose infection nor to guide or monitor treatment. Performed at [facility], [place] [address], [place], [place] [address]          Radiology Studies: Dg Chest 1 View  Result Date: 01/29/2019 CLINICAL DATA:  Left hip fracture.  Pre-op respiratory exam EXAM: CHEST  1 VIEW COMPARISON:  01/27/2019 FINDINGS: Heart size is normal. Aortic atherosclerosis. Pulmonary hyperinflation again seen, consistent with COPD. Both lungs are clear. IMPRESSION: COPD. No active disease. Electronically Signed  By: Marlaine Hind M.D.   On: 01/29/2019 14:58   Dg Elbow 2 Views Left  Result Date: 01/29/2019 CLINICAL DATA:  Fall today.  Left elbow pain.  Initial encounter. EXAM: LEFT ELBOW - 2 VIEW COMPARISON:  None. FINDINGS: There is no evidence of fracture, dislocation, or joint effusion. There is no evidence of arthropathy or other focal bone abnormality. Soft tissues are unremarkable. IMPRESSION: Negative. Electronically Signed   By: Marlaine Hind M.D.   On: 01/29/2019 14:59   Ct Hip Left Wo Contrast  Result Date: 01/29/2019 CLINICAL DATA:  Left proximal femur fracture. Left hip  pain secondary to a fall. EXAM: CT OF THE LEFT HIP WITHOUT CONTRAST TECHNIQUE: Multidetector CT imaging of the left hip was performed according to the standard protocol. Multiplanar CT image reconstructions were also generated. COMPARISON:  Radiographs dated 01/29/2019 FINDINGS: Bones/Joint/Cartilage There is a comminuted intertrochanteric fracture of the proximal left femur with angulation and impaction. A large portion of the greater trochanter is avulsed and slightly displaced. There is no dislocation at the hip. Mild arthritic changes of the hip joint with joint space narrowing and small marginal osteophytes on the left femoral head. Visualized pelvic bones are intact. Soft tissues There is hemorrhage in the subcutaneous fat lateral to the left hip as well as some hemorrhage into the adjacent musculature around the hip. IMPRESSION: Comminuted intertrochanteric fracture of the proximal left femur as described. Electronically Signed   By: Lorriane Shire M.D.   On: 01/29/2019 17:16   Dg C-arm 1-60 Min  Result Date: 01/30/2019 CLINICAL DATA:  Intramedullary rod fixation of the left femur EXAM: DG C-ARM 61-120 MIN; LEFT FEMUR 2 VIEWS FLUOROSCOPY TIME:  3 minutes, 4 seconds COMPARISON:  Left hip radiograph-01/29/2019; left hip CT-01/29/2019 FINDINGS: 5 spot intraoperative fluoroscopic images of the left femur are provided for review. Provided images demonstrate the sequela intramedullary rod fixation of the left femur and dynamic screw fixation of the left femoral neck x2. The distal end of the femoral rod is transfixed with 2 cancellous screws. Note is made of an adjacent long segment vascular stent overlying expected location of the SFA. Expected adjacent soft tissue swelling.  No radiopaque foreign body. IMPRESSION: Post intramedullary rod fixation of the left femur and femoral neck without evidence of complication. Electronically Signed   By: Sandi Mariscal M.D.   On: 01/30/2019 14:02   Dg Hip Unilat With  Pelvis 2-3 Views Left  Result Date: 01/29/2019 CLINICAL DATA:  Fall today. Left hip pain.  Initial encounter. EXAM: DG HIP (WITH OR WITHOUT PELVIS) 2-3V LEFT COMPARISON:  None. FINDINGS: Mildly impacted and displaced intertrochanteric left hip fracture is seen. No evidence of hip dislocation. Mild left hip osteoarthritis noted. IMPRESSION: Intertrochanteric left hip fracture. Electronically Signed   By: Marlaine Hind M.D.   On: 01/29/2019 14:57   Dg Femur Min 2 Views Left  Result Date: 01/30/2019 CLINICAL DATA:  Intramedullary rod fixation of the left femur EXAM: DG C-ARM 61-120 MIN; LEFT FEMUR 2 VIEWS FLUOROSCOPY TIME:  3 minutes, 4 seconds COMPARISON:  Left hip radiograph-01/29/2019; left hip CT-01/29/2019 FINDINGS: 5 spot intraoperative fluoroscopic images of the left femur are provided for review. Provided images demonstrate the sequela intramedullary rod fixation of the left femur and dynamic screw fixation of the left femoral neck x2. The distal end of the femoral rod is transfixed with 2 cancellous screws. Note is made of an adjacent long segment vascular stent overlying expected location of the SFA. Expected adjacent soft tissue swelling.  No radiopaque foreign body. IMPRESSION: Post intramedullary rod fixation of the left femur and femoral neck without evidence of complication. Electronically Signed   By: Sandi Mariscal M.D.   On: 01/30/2019 14:02        Scheduled Meds:  aspirin EC  81 mg Oral Q breakfast   atorvastatin  20 mg Oral Daily   azelastine  1 spray Each Nare BID   budesonide (PULMICORT) nebulizer solution  0.5 mg Nebulization BID   calcium-vitamin D  1 tablet Oral Q breakfast   clopidogrel  75 mg Oral Daily   docusate sodium  100 mg Oral BID   feeding supplement (ENSURE ENLIVE)  237 mL Oral TID BM   ferrous sulfate  325 mg Oral Q breakfast   folic acid  1 mg Oral Daily   gabapentin  300 mg Oral QHS   guaiFENesin  1,200 mg Oral BID   ipratropium-albuterol  3 mL  Nebulization Q6H   LORazepam  0-4 mg Oral Q6H   Followed by   LORazepam  0-4 mg Oral Q12H   magnesium oxide  200 mg Oral Daily   multivitamin with minerals  1 tablet Oral Daily   pantoprazole  40 mg Oral Daily   senna  1 tablet Oral BID   thiamine  100 mg Oral Daily   umeclidinium-vilanterol  1 puff Inhalation Daily   Vitamin D (Ergocalciferol)  50,000 Units Oral Q7 days   Continuous Infusions:  dextrose 5 % and 0.45% NaCl 150 mL/hr at 01/31/19 1009   lactated ringers 10 mL/hr at 01/30/19 1024   methocarbamol (ROBAXIN) IV       LOS: 2 days   Time spent= 40 mins    Lennie Vasco Arsenio Loader, MD [facility]  If 7PM-7AM, please contact night-coverage [email or web address] 01/31/2019, 12:59 PM

## 2019-02-01 LAB — COMPREHENSIVE METABOLIC PANEL
ALT: 12 U/L (ref 0–44)
AST: 19 U/L (ref 15–41)
Albumin: 2.9 g/dL — ABNORMAL LOW (ref 3.5–5.0)
Alkaline Phosphatase: 66 U/L (ref 38–126)
Anion gap: 6 (ref 5–15)
BUN: 5 mg/dL — ABNORMAL LOW (ref 8–23)
CO2: 31 mmol/L (ref 22–32)
Calcium: 8.4 mg/dL — ABNORMAL LOW (ref 8.9–10.3)
Chloride: 95 mmol/L — ABNORMAL LOW (ref 98–111)
Creatinine, Ser: 0.37 mg/dL — ABNORMAL LOW (ref 0.44–1.00)
GFR calc Af Amer: >60 mL/min (ref >60)
GFR calc non Af Amer: >60 mL/min (ref >60)
Glucose, Bld: 98 mg/dL (ref 70–99)
Potassium: 4.4 mmol/L (ref 3.5–5.1)
Sodium: 132 mmol/L — ABNORMAL LOW (ref 135–145)
Total Bilirubin: 1 mg/dL (ref 0.3–1.2)
Total Protein: 5 g/dL — ABNORMAL LOW (ref 6.5–8.1)

## 2019-02-01 LAB — TYPE AND SCREEN
ABO/RH(D): O POS
Antibody Screen: NEGATIVE
Unit division: 0
Unit division: 0

## 2019-02-01 LAB — CBC
HCT: 28 % — ABNORMAL LOW (ref 36.0–46.0)
Hemoglobin: 9.3 g/dL — ABNORMAL LOW (ref 12.0–15.0)
MCH: 31.6 pg (ref 26.0–34.0)
MCHC: 33.2 g/dL (ref 30.0–36.0)
MCV: 95.2 fL (ref 80.0–100.0)
Platelets: 153 10*3/uL (ref 150–400)
RBC: 2.94 MIL/uL — ABNORMAL LOW (ref 3.87–5.11)
RDW: 19.7 % — ABNORMAL HIGH (ref 11.5–15.5)
WBC: 6.6 10*3/uL (ref 4.0–10.5)
nRBC: 0 % (ref 0.0–0.2)

## 2019-02-01 LAB — BPAM RBC
Blood Product Expiration Date: 202008182359
Blood Product Expiration Date: 202008192359
ISSUE DATE / TIME: 202007270807
ISSUE DATE / TIME: 202007271140
Unit Type and Rh: 5100
Unit Type and Rh: 5100

## 2019-02-01 LAB — BRAIN NATRIURETIC PEPTIDE: B Natriuretic Peptide: 284.8 pg/mL — ABNORMAL HIGH (ref 0.0–100.0)

## 2019-02-01 LAB — MAGNESIUM: Magnesium: 1.8 mg/dL (ref 1.7–2.4)

## 2019-02-01 MED ORDER — ASPIRIN 81 MG PO TBEC: 81.0000 mg | DELAYED_RELEASE_TABLET | Freq: Every day | ORAL | 2 refills | Status: AC

## 2019-02-01 MED ORDER — VITAMIN D (ERGOCALCIFEROL) 1.25 MG (50000 UNIT) PO CAPS: 50000.0000 [IU] | ORAL_CAPSULE | ORAL | 0 refills | Status: DC

## 2019-02-01 MED ORDER — HYDROCODONE-ACETAMINOPHEN 5-325 MG PO TABS: 1.0000 | ORAL_TABLET | Freq: Four times a day (QID) | ORAL | 0 refills | Status: AC | PRN

## 2019-02-01 MED ORDER — DOCUSATE SODIUM 100 MG PO CAPS: 100.0000 mg | ORAL_CAPSULE | Freq: Two times a day (BID) | ORAL | 0 refills | Status: DC

## 2019-02-01 MED ORDER — GABAPENTIN 400 MG PO CAPS
400.0000 mg | ORAL_CAPSULE | Freq: Three times a day (TID) | ORAL | Status: DC
  Administered 2019-02-01 – 2019-02-02 (×4): 400 mg via ORAL
  Filled 2019-02-01 (×4): qty 1

## 2019-02-01 MED ORDER — ENSURE ENLIVE PO LIQD: 237.0000 mL | Freq: Three times a day (TID) | ORAL | 12 refills | Status: DC

## 2019-02-01 NOTE — Progress Notes (Signed)
Handed over the following DME to Pt's husband: 1. bath seat with back, 2. 3-in-1 BSC, 3. Transfer tub bench knock down tool-free back, legs and arm. Pt's walker left at bedside.

## 2019-02-01 NOTE — Progress Notes (Signed)
Pt's husband will be here at 84 am tomorrow for family ed training for physical therapy. Governor Rooks, PTA Acute Rehabilitation Services Pager [phone] Office [phone]

## 2019-02-01 NOTE — Progress Notes (Signed)
PROGRESS NOTE    Beth Daniels  VQM:[id number] DOB: 1945/09/04 DOA: 01/29/2019 PCP: Shon Baton, MD   Brief Narrative:  73 year old with history of osteoporosis, breast cancer status post mastectomy, chronic hyponatremia, COPD with chronic hypoxia on 2 L nasal cannula, alcohol abuse, peripheral vascular disease status post stent placement in the left lower extremity presented with mechanical fall due to poor gait from alcohol-induced neuropathy.  Was found to have left hip fracture.  Orthopedic was consulted.  Patient underwent intramedullary nailing of her left hip on 7/26.  Postoperatively developed anemia, likely blood loss therefore units PRBC transfusion ordered.  She was noted to be deficient in vitamin D and thiamine.  7/28: Patient is status post 2 unit PRBC transfusion with improvement in hemoglobin and hematocrit levels noted.  She is doing well from a PT/OT perspective and appeared to be able to discharge today, however husband states that they have recently moved and home environment is not ready for her to come back home as of yet.  Anticipate discharge 7/29.  Assessment & Plan:   Principal Problem:   Closed left hip fracture (HCC) Active Problems:   COPD (chronic obstructive pulmonary disease) (HCC)   PVD (peripheral vascular disease) (HCC)   Alcohol dependence ([place])   Severe protein-calorie malnutrition (HCC)   AKI (acute kidney injury) ([place])   Mechanical fall leading to left intertrochanteric impacted fracture with mild displacement, closed Vitamin D deficiency -Plan for surgical intervention per orthopedic. -Pain control PT/OT- recommending home health -Bowel regimen, incentive spirometer.   - Start vitamin D supplements - Orthopedic recommends DVT prophylaxis- aspirin and Plavix. -Plan to discharge in a.m. once home environment stable  Acute blood loss anemia Chronic macrocytic anemia-improved status post 2 unit PRBC transfusion -Repeat a.m. CBC to ensure stability  with great improvement noted -Borderline low ferritin therefore iron supplement started.  Abnormal breath sounds suspect COPD exacerbation Chronic hypoxia on 2 L nasal cannula - Order scheduled and as needed bronchodilators.  Pulmicort twice daily. -Incentive spirometer and flutter valve. -Chest x-ray with chronic COPD changes noted on 7/27  Acute kidney injury, resolved -Baseline creatinine 0.3, trended up to 0.8.  Has trended down to 0.45.   Hyperkalemia, resolved - Repeat a.m. labs  History of peripheral vascular disease status post stent placement 3 years ago and left lower extremity -Remain on aspirin and Plavix  Hyperlipidemia -On statin.  Alcohol-induced neuropathy Alcohol dependence -On gabapentin. -Alcohol withdrawal protocol. -Multivitamin, folate and thiamine ordered.  Severe protein calorie malnutrition -Dietitian consulted.  Chronic hyponatremia - Likely due to underlying alcohol use.  We will continue to monitor sodium levels.  History of osteoporosis -Receives outpatient Prolia.  DVT prophylaxis: SCDs Code Status: Full Family Communication: Discussed with husband on phone Disposition Plan: Plan for discharge in a.m. once home environment is ready and patient has equipment.  Plan for home health.   Consultants:   Orthopedics  Procedures:  Left intramedullary nail, intertrochanteric 7/26  Antimicrobials:  Anti-infectives (From admission, onward)   Start     Dose/Rate Route Frequency Ordered Stop   01/30/19 1730  ceFAZolin (ANCEF) IVPB 2g/100 mL premix     2 g 200 mL/hr over 30 Minutes Intravenous Every 6 hours 01/30/19 1522 01/31/19 0047   01/30/19 1030  ceFAZolin (ANCEF) IVPB 2g/100 mL premix     2 g 200 mL/hr over 30 Minutes Intravenous On call to O.R. 01/29/19 1757 01/30/19 1128       Subjective: Patient seen and evaluated today with no new acute complaints  or concerns. No acute concerns or events noted overnight.  She states  that her pain is improved.  She denies any shortness of breath or cough.  Objective: Vitals:   02/01/19 0100 02/01/19 0443 02/01/19 0920 02/01/19 1159  BP:  (!) 108/57 102/66 122/63  Pulse:  77 89 87  Resp:  17 16   Temp:  98 F (36.7 C) 98.6 F (37 C)   TempSrc:  Oral Oral   SpO2: 94% 94% 96%   Weight:      Height:        Intake/Output Summary (Last 24 hours) at 02/01/2019 1245 Last data filed at 02/01/2019 1016 Gross per 24 hour  Intake 480 ml  Output 700 ml  Net -220 ml   Filed Weights   01/30/19 1515  Weight: 40 kg    Examination:  General exam: Appears calm and comfortable  Respiratory system: Clear to auscultation. Respiratory effort normal. Cardiovascular system: S1 & S2 heard, RRR. No JVD, murmurs, rubs, gallops or clicks. No pedal edema. Gastrointestinal system: Abdomen is nondistended, soft and nontender. No organomegaly or masses felt. Normal bowel sounds heard. Central nervous system: Alert and oriented. No focal neurological deficits. Extremities: Symmetric 5 x 5 power. Skin: No rashes, lesions or ulcers Psychiatry: Judgement and insight appear normal. Mood & affect appropriate.     Data Reviewed: I have personally reviewed following labs and imaging studies  CBC: Recent Labs  Lab 01/29/19 1354 01/30/19 0312 01/31/19 0336 01/31/19 1620 02/01/19 0349  WBC 4.6 7.7 8.4  --  6.6  NEUTROABS 3.5  --   --   --   --   HGB 11.6* 8.2* 5.5* 9.1* 9.3*  HCT 34.7* 23.9* 16.7* 26.9* 28.0*  MCV 104.2* 101.3* 105.7*  --  95.2  PLT 279 219 166  --  161   Basic Metabolic Panel: Recent Labs  Lab 01/29/19 1354 01/30/19 0312 01/31/19 0336 02/01/19 0349  NA 132* 127* 129* 132*  K 4.1 5.2* 4.6 4.4  CL 96* 94* 92* 95*  CO2 21* 24 28 31   GLUCOSE 72 165* 130* 98  BUN <5* 12 7* 5*  CREATININE 0.36* 0.81 0.45 0.37*  CALCIUM 8.4* 8.4* 7.9* 8.4*  MG  --   --  1.8 1.8   GFR: Estimated Creatinine Clearance: 40.1 mL/min (A) (by C-G formula based on SCr of 0.37  mg/dL (L)). Liver Function Tests: Recent Labs  Lab 01/31/19 0336 02/01/19 0349  AST 19 19  ALT 13 12  ALKPHOS 53 66  BILITOT 0.6 1.0  PROT 5.1* 5.0*  ALBUMIN 2.9* 2.9*   No results for input(s): LIPASE, AMYLASE in the last 168 hours. No results for input(s): AMMONIA in the last 168 hours. Coagulation Profile: Recent Labs  Lab 01/29/19 1354  INR 1.0   Cardiac Enzymes: Recent Labs  Lab 01/30/19 0923  CKTOTAL 46   BNP (last 3 results) No results for input(s): PROBNP in the last 8760 hours. HbA1C: No results for input(s): HGBA1C in the last 72 hours. CBG: No results for input(s): GLUCAP in the last 168 hours. Lipid Profile: No results for input(s): CHOL, HDL, LDLCALC, TRIG, CHOLHDL, LDLDIRECT in the last 72 hours. Thyroid Function Tests: Recent Labs    01/30/19 0923  TSH 2.703   Anemia Panel: Recent Labs    01/30/19 0923  FERRITIN 49  TIBC 210*  IRON 29   Sepsis Labs: No results for input(s): PROCALCITON, LATICACIDVEN in the last 168 hours.  Recent Results (from the past 240  hour(s))  SARS Coronavirus 2 (CEPHEID - Performed in [facility] hospital lab), Hosp Order     Status: None   Collection Time: 01/29/19  3:20 PM   Specimen: Nasopharyngeal Swab  Result Value Ref Range Status   SARS Coronavirus 2 NEGATIVE NEGATIVE Final    Comment: (NOTE) If result is NEGATIVE SARS-CoV-2 target nucleic acids are NOT DETECTED. The SARS-CoV-2 RNA is generally detectable in upper and lower  respiratory specimens during the acute phase of infection. The lowest  concentration of SARS-CoV-2 viral copies this assay can detect is 250  copies / mL. A negative result does not preclude SARS-CoV-2 infection  and should not be used as the sole basis for treatment or other  patient management decisions.  A negative result may occur with  improper specimen collection / handling, submission of specimen other  than nasopharyngeal swab, presence of viral mutation(s) within the  areas  targeted by this assay, and inadequate number of viral copies  (<250 copies / mL). A negative result must be combined with clinical  observations, patient history, and epidemiological information. If result is POSITIVE SARS-CoV-2 target nucleic acids are DETECTED. The SARS-CoV-2 RNA is generally detectable in upper and lower  respiratory specimens dur ing the acute phase of infection.  Positive  results are indicative of active infection with SARS-CoV-2.  Clinical  correlation with patient history and other diagnostic information is  necessary to determine patient infection status.  Positive results do  not rule out bacterial infection or co-infection with other viruses. If result is PRESUMPTIVE POSTIVE SARS-CoV-2 nucleic acids MAY BE PRESENT.   A presumptive positive result was obtained on the submitted specimen  and confirmed on repeat testing.  While 2019 novel coronavirus  (SARS-CoV-2) nucleic acids may be present in the submitted sample  additional confirmatory testing may be necessary for epidemiological  and / or clinical management purposes  to differentiate between  SARS-CoV-2 and other Sarbecovirus currently known to infect humans.  If clinically indicated additional testing with an alternate test  methodology [phone]) is advised. The SARS-CoV-2 RNA is generally  detectable in upper and lower respiratory sp ecimens during the acute  phase of infection. The expected result is Negative. Fact Sheet for Patients:  StrictlyIdeas.no Fact Sheet for Healthcare Providers: BankingDealers.co.za This test is not yet approved or cleared by the [place] FDA and has been authorized for detection and/or diagnosis of SARS-CoV-2 by FDA under an Emergency Use Authorization (EUA).  This EUA will remain in effect (meaning this test can be used) for the duration of the COVID-19 declaration under Section 564(b)(1) of the Act, 21 U.S.C. section  360bbb-3(b)(1), unless the authorization is terminated or revoked sooner. Performed at [facility], [place] [address], [place], [place] [address]   Surgical pcr screen     Status: None   Collection Time: 01/30/19  6:20 AM   Specimen: Nasal Mucosa; Nasal Swab  Result Value Ref Range Status   MRSA, PCR NEGATIVE NEGATIVE Final   Staphylococcus aureus NEGATIVE NEGATIVE Final    Comment: (NOTE) The Xpert SA Assay (FDA approved for NASAL specimens in patients 28 years of age and older), is one component of a comprehensive surveillance program. It is not intended to diagnose infection nor to guide or monitor treatment. Performed at [facility], [place] [address] [place], [place] [address]          Radiology Studies: Dg Chest [facility] 1 View  Result Date: 01/31/2019 CLINICAL DATA:  Dyspnea, emphysema,  COPD, RIGHT breast cancer, former smoker EXAM: PORTABLE CHEST 1 VIEW COMPARISON:  Portable exam 1312 hours compared to 01/29/2019 FINDINGS: Normal heart size, mediastinal contours, and pulmonary vascularity. Atherosclerotic calcification aorta. Emphysematous and bronchitic changes with mild biapical scarring consistent with COPD. Minimal bibasilar atelectasis. Bones demineralized. IMPRESSION: COPD changes with minimal bibasilar atelectasis. Electronically Signed   By: Lavonia Dana M.D.   On: 01/31/2019 13:38   Dg C-arm 1-60 Min  Result Date: 01/30/2019 CLINICAL DATA:  Intramedullary rod fixation of the left femur EXAM: DG C-ARM 61-120 MIN; LEFT FEMUR 2 VIEWS FLUOROSCOPY TIME:  3 minutes, 4 seconds COMPARISON:  Left hip radiograph-01/29/2019; left hip CT-01/29/2019 FINDINGS: 5 spot intraoperative fluoroscopic images of the left femur are provided for review. Provided images demonstrate the sequela intramedullary rod fixation of the left femur and dynamic screw fixation of the left femoral neck x2. The distal end of the femoral rod is transfixed with 2 cancellous screws. Note is made of an  adjacent long segment vascular stent overlying expected location of the SFA. Expected adjacent soft tissue swelling.  No radiopaque foreign body. IMPRESSION: Post intramedullary rod fixation of the left femur and femoral neck without evidence of complication. Electronically Signed   By: Sandi Mariscal M.D.   On: 01/30/2019 14:02   Dg Femur Min 2 Views Left  Result Date: 01/30/2019 CLINICAL DATA:  Intramedullary rod fixation of the left femur EXAM: DG C-ARM 61-120 MIN; LEFT FEMUR 2 VIEWS FLUOROSCOPY TIME:  3 minutes, 4 seconds COMPARISON:  Left hip radiograph-01/29/2019; left hip CT-01/29/2019 FINDINGS: 5 spot intraoperative fluoroscopic images of the left femur are provided for review. Provided images demonstrate the sequela intramedullary rod fixation of the left femur and dynamic screw fixation of the left femoral neck x2. The distal end of the femoral rod is transfixed with 2 cancellous screws. Note is made of an adjacent long segment vascular stent overlying expected location of the SFA. Expected adjacent soft tissue swelling.  No radiopaque foreign body. IMPRESSION: Post intramedullary rod fixation of the left femur and femoral neck without evidence of complication. Electronically Signed   By: Sandi Mariscal M.D.   On: 01/30/2019 14:02        Scheduled Meds: . aspirin EC  81 mg Oral Q breakfast  . atorvastatin  20 mg Oral Daily  . azelastine  1 spray Each Nare BID  . budesonide (PULMICORT) nebulizer solution  0.5 mg Nebulization BID  . calcium-vitamin D  1 tablet Oral Q breakfast  . clopidogrel  75 mg Oral Daily  . docusate sodium  100 mg Oral BID  . feeding supplement (ENSURE ENLIVE)  237 mL Oral TID BM  . ferrous sulfate  325 mg Oral Q breakfast  . folic acid  1 mg Oral Daily  . gabapentin  400 mg Oral TID  . guaiFENesin  1,200 mg Oral BID  . ipratropium-albuterol  3 mL Nebulization BID  . LORazepam  0-4 mg Oral Q12H  . magnesium oxide  200 mg Oral Daily  . multivitamin with minerals  1  tablet Oral Daily  . pantoprazole  40 mg Oral Daily  . senna  1 tablet Oral BID  . thiamine  100 mg Oral Daily  . Vitamin D (Ergocalciferol)  50,000 Units Oral Q7 days   Continuous Infusions: . lactated ringers 10 mL/hr at 01/30/19 1024  . methocarbamol (ROBAXIN) IV       LOS: 3 days    Time spent: 30 minutes    Antion Andres Darleen Crocker,  DO [facility] Pager [phone]  If 7PM-7AM, please contact night-coverage [email or web address] Password [facility] 02/01/2019, 12:45 PM

## 2019-02-01 NOTE — Progress Notes (Signed)
PATIENT ID: Beth Daniels  MRN: [id number]  DOB/AGE:  [birth year] / 74 y.o.  2 Days Post-Op Procedure(s) (LRB): INTRAMEDULLARY (IM) NAIL INTERTROCHANTRIC (Left)    PROGRESS NOTE Subjective:   Patient is alert, oriented, no Nausea, no Vomiting, yes passing gas, no Bowel Movement. Taking PO well. Denies SOB, Chest or Calf Pain. Using Incentive Spirometer, PAS in place. Ambulate 50 % weight bearing, Patient reports pain as moderate,     Objective: Vital signs in last 24 hours: Temp:  [98 F (36.7 C)-99.4 F (37.4 C)] 98.6 F (37 C) (07/28 0920) Pulse Rate:  [77-91] 89 (07/28 0920) Resp:  [15-19] 16 (07/28 0920) BP: (99-121)/(44-66) 102/66 (07/28 0920) SpO2:  [93 %-100 %] 96 % (07/28 0920)    Intake/Output from previous day: I/O last 3 completed shifts: In: 2062.9 [P.O.:600; I.V.:1462.9] Out: 1200 [Urine:1200]   Intake/Output this shift: No intake/output data recorded.   LABORATORY DATA: Recent Labs    01/29/19 1354  01/31/19 0336 01/31/19 1620 02/01/19 0349  WBC 4.6   < > 8.4  --  6.6  HGB 11.6*   < > 5.5* 9.1* 9.3*  HCT 34.7*   < > 16.7* 26.9* 28.0*  PLT 279   < > 166  --  153  NA 132*   < > 129*  --  132*  K 4.1   < > 4.6  --  4.4  CL 96*   < > 92*  --  95*  CO2 21*   < > 28  --  31  BUN <5*   < > 7*  --  5*  CREATININE 0.36*   < > 0.45  --  0.37*  GLUCOSE 72   < > 130*  --  98  INR 1.0  --   --   --   --   CALCIUM 8.4*   < > 7.9*  --  8.4*   < > = values in this interval not displayed.    Examination: Neurologically intact Neurovascular intact Sensation intact distally Intact pulses distally Dorsiflexion/Plantar flexion intact Incision: dressing C/D/I and no drainage No cellulitis present Compartment soft}  Assessment:   2 Days Post-Op Procedure(s) (LRB): INTRAMEDULLARY (IM) NAIL INTERTROCHANTRIC (Left) ADDITIONAL DIAGNOSIS:  Acute Blood Loss Anemia, Renal Insufficiency Acute and PVD, Alcohol dependence  Plan:  Partial Weight Bearing @ 50% (PWB)  DVT  Prophylaxis:  Aspirin and Plavix  DISCHARGE PLAN: Home, when medically stable  DISCHARGE NEEDS: HHPT, Walker and 3-in-1 comode seat     Joanell Rising 02/01/2019, 9:44 AM

## 2019-02-01 NOTE — Plan of Care (Signed)

## 2019-02-01 NOTE — TOC Initial Note (Addendum)
Transition of Care [facility]) - Initial/Assessment Note    Patient Details  Name: Beth Daniels MRN: [id number] Date of Birth: 04-Aug-1945  Transition of Care [facility]) CM/SW Contact:    Alberteen Sam, [place] Phone Number: [phone] 02/01/2019, 12:26 PM  Clinical Narrative:                  CSW consulted with patient's husband Beth Daniels to address his multiple concerns. CSW has set patient up with [facility] which will be at patient's home by Friday at the latest (potentially Thursday) for PT, OT, and aid services. CSW requested equipment from [employer] to include tub bench, 3in1, rolling walker with seat, and shower chair to be delivered to patient's room prior to discharge.   Expected Discharge Plan: [place] Barriers to Discharge: Continued Medical Work up   Patient Goals and CMS Choice   CMS Medicare.gov Compare Post Acute Care list provided to:: Patient Represenative (must comment)(husband Beth Daniels) Choice offered to / list presented to : Spouse(Beth Daniels)  Expected Discharge Plan and Services Expected Discharge Plan: [place] Acute Care Choice: [place] arrangements for the past 2 months: Single Family Home Expected Discharge Date: 02/01/19               DME Arranged: Tub bench, Shower stool, Walker rolling with seat, 3-N-1 DME Agency: [employer] Date DME Agency Contacted: 02/01/19 Time DME Agency Contacted: 1200 Representative spoke with at DME Agency: [place]: PT, OT, Nurse's Aide [place] Agency: [facility] [employer] [facility] [employer] Date [place]: 02/01/19 Time [place]: 1210 Representative spoke with at [place]: [place]  Prior Living Arrangements/Services Living arrangements for the past 2 months: [place] with:: Spouse Patient language and need for interpreter reviewed:: Yes Do you feel safe going back to the place where you live?: Yes      Need for Family Participation in  Patient Care: Yes (Comment) Care giver support system in place?: Yes (comment)   Criminal Activity/Legal Involvement Pertinent to Current Situation/Hospitalization: No - Comment as needed  Activities of Daily Living Home Assistive Devices/Equipment: None ADL Screening (condition at time of admission) Patient's cognitive ability adequate to safely complete daily activities?: Yes Is the patient deaf or have difficulty hearing?: Yes Does the patient have difficulty seeing, even when wearing glasses/contacts?: No Does the patient have difficulty concentrating, remembering, or making decisions?: No Patient able to express need for assistance with ADLs?: Yes Does the patient have difficulty dressing or bathing?: No Independently performs ADLs?: Yes (appropriate for developmental age) Does the patient have difficulty walking or climbing stairs?: Yes Weakness of Legs: Both Weakness of Arms/Hands: None  Permission Sought/Granted Permission sought to share information with : Case Manager, Family Supports, Pharmacist, community Information with NAME: Donal  Permission granted to share info w AGENCY: [place] granted to share info w Relationship: spouse  Permission granted to share info w Contact Information: [phone]  Emotional Assessment Appearance:: Appears stated age Attitude/Demeanor/Rapport: Unable to Assess Affect (typically observed): Unable to Assess Orientation: : Oriented to Self, Oriented to Place, Oriented to  Time, Oriented to Situation Alcohol / Substance Use: Not Applicable Psych Involvement: No (comment)  Admission diagnosis:  Preop examination [Z01.818] Closed fracture of left hip, initial encounter ([place]) [S72.002A] Fall, initial encounter [W19.XXXA] Patient Active Problem List   Diagnosis Date Noted  . Severe protein-calorie malnutrition ([place]) 01/30/2019  .  AKI (acute kidney injury) ([place]) 01/30/2019  . COPD (chronic obstructive pulmonary  disease) ([place]) 01/29/2019  . PVD (peripheral vascular disease) ([place]) 01/29/2019  . Alcohol dependence ([place]) 01/29/2019  . Closed left hip fracture ([place]) 01/29/2019  . Alcoholic peripheral neuropathy ([place]) 01/24/2019   PCP:  Shon Baton, MD Pharmacy:   [facility] [place], [place] [address] [place] [address] Phone: [phone] Fax: [phone]  [facility] [place], [place] [address] [place] [address] Phone: [phone] Fax: [phone]     Social Determinants of Health (SDOH) Interventions    Readmission Risk Interventions No flowsheet data found.

## 2019-02-01 NOTE — Progress Notes (Signed)
Physical Therapy Treatment Patient Details Name: Beth Daniels MRN: [id number] DOB: 1945/10/23 Today's Date: 02/01/2019    History of Present Illness Pt is 73 yo female who presents after mechanical fall in bathroom with L elbow pain (no fx) and L hip pain with intertrochanteric fx. Plan is for sx on 7/26. PMH: breast cancer with mastectomy, COPD, PVD, alcohol induced peripheral neuropathy    PT Comments    Pt initially resistive to PT session reporting she was instructed to lay still with her pillow under her surgical limb.  PTA educated on the benefits of mobility to heal and reassured her is was okay to mobilize.  Pt intermittently agitated during session to pain.  She required max cues for technique and education.  Plan for return home remains appropriate as she is progressing well.  She does present with safety concerns and required cueing to maintain PWB.  Pt issued HEP for home use and educated on frequency.      Follow Up Recommendations  Home health PT;Supervision/Assistance - 24 hour     Equipment Recommendations  Rolling walker with 5" wheels;3in1 (PT)    Recommendations for Other Services       Precautions / Restrictions Precautions Precautions: Fall Restrictions Weight Bearing Restrictions: Yes LLE Weight Bearing: Partial weight bearing LLE Partial Weight Bearing Percentage or Pounds: 50    Mobility  Bed Mobility Overal bed mobility: Needs Assistance Bed Mobility: Supine to Sit;Sit to Supine     Supine to sit: Mod assist Sit to supine: Min assist   General bed mobility comments: Pt required moderate assistance to mobilize patient to edge of bed.  She began to raise voice at therapist for transfer being painful.  PTA educated on working and breathing in response to pain and that not all pain can be alleviated and it will be painful to mobilize.  Pt was easy to redirect.  Transfers Overall transfer level: Needs assistance Equipment used: Rolling walker (2  wheeled) Transfers: Sit to/from Stand Sit to Stand: Min guard         General transfer comment: PTA cued to push from seated surface but patient proceeded to pull on RW to achieve standing.  She did follow commands for reaching back to seated surface to return to edge of bed.  Ambulation/Gait Ambulation/Gait assistance: Min guard Gait Distance (Feet): 20 Feet Assistive device: Rolling walker (2 wheeled) Gait Pattern/deviations: Decreased stride length;Step-to pattern;Decreased weight shift to left;Decreased stance time - left     General Gait Details: Cues for weight bearing.  Pt initially demonstrating hop to pattern.  Cues for L heel strike and placing 50 % on her LLE and 50 % on B UEs.  She remains impulsive and required cues to maintain step to pattern.   Stairs             Wheelchair Mobility    Modified Rankin (Stroke Patients Only)       Balance Overall balance assessment: Needs assistance Sitting-balance support: Feet supported;Bilateral upper extremity supported Sitting balance-Leahy Scale: Good Sitting balance - Comments: pt requires bilat UE support to elevate off L side of hip     Standing balance-Leahy Scale: Poor Standing balance comment: dependent on RW                            Cognition Arousal/Alertness: Awake/alert Behavior During Therapy: Anxious Overall Cognitive Status: Within Functional Limits for tasks assessed  General Comments: pt very anxious regarding onset of pain with movement however becomes impulsive with movement      Exercises Total Joint Exercises Ankle Circles/Pumps: AROM;Both;20 reps;Supine Quad Sets: AROM;Left;10 reps;Supine Heel Slides: AROM;Left;10 reps;Supine Hip ABduction/ADduction: AROM;Left;10 reps;Supine    General Comments        Pertinent Vitals/Pain Pain Assessment: 0-10 Pain Location: left leg (when move) Pain Descriptors / Indicators:  Sharp Pain Intervention(s): Monitored during session;Repositioned    Home Living Family/patient expects to be discharged to:: Private residence Living Arrangements: Spouse/significant other                  Prior Function            PT Goals (current goals can now be found in the care plan section) Acute Rehab PT Goals Patient Stated Goal: to go home Potential to Achieve Goals: Good    Frequency    Min 5X/week      PT Plan Current plan remains appropriate    Co-evaluation              AM-PAC PT "6 Clicks" Mobility   Outcome Measure  Help needed turning from your back to your side while in a flat bed without using bedrails?: A Lot Help needed moving from lying on your back to sitting on the side of a flat bed without using bedrails?: A Lot Help needed moving to and from a bed to a chair (including a wheelchair)?: A Little Help needed standing up from a chair using your arms (e.g., wheelchair or bedside chair)?: A Little Help needed to walk in hospital room?: A Little Help needed climbing 3-5 steps with a railing? : A Lot 6 Click Score: 15    End of Session Equipment Utilized During Treatment: Gait belt;Oxygen Activity Tolerance: Patient limited by pain Patient left: in chair;with call bell/phone within reach;with nursing/sitter in room Nurse Communication: Mobility status PT Visit Diagnosis: Unsteadiness on feet (R26.81);Pain Pain - Right/Left: Left Pain - part of body: Leg     Time: 6144-3154 PT Time Calculation (min) (ACUTE ONLY): 33 min  Charges:  $Gait Training: 8-22 mins $Therapeutic Exercise: 8-22 mins                     Governor Rooks, PTA Acute Rehabilitation Services Pager [phone] Office [phone]     [name] [name] 02/01/2019, 11:13 AM

## 2019-02-01 NOTE — Plan of Care (Signed)
  Problem: Nutrition: Goal: Adequate nutrition will be maintained Outcome: Progressing   Problem: Pain Managment: Goal: General experience of comfort will improve Outcome: Progressing   Problem: Safety: Goal: Ability to remain free from injury will improve Outcome: Progressing   

## 2019-02-02 LAB — COMPREHENSIVE METABOLIC PANEL
ALT: 11 U/L (ref 0–44)
AST: 19 U/L (ref 15–41)
Albumin: 2.7 g/dL — ABNORMAL LOW (ref 3.5–5.0)
Alkaline Phosphatase: 75 U/L (ref 38–126)
Anion gap: 8 (ref 5–15)
BUN: 9 mg/dL (ref 8–23)
CO2: 30 mmol/L (ref 22–32)
Calcium: 8.5 mg/dL — ABNORMAL LOW (ref 8.9–10.3)
Chloride: 95 mmol/L — ABNORMAL LOW (ref 98–111)
Creatinine, Ser: 0.34 mg/dL — ABNORMAL LOW (ref 0.44–1.00)
GFR calc Af Amer: >60 mL/min (ref >60)
GFR calc non Af Amer: >60 mL/min (ref >60)
Glucose, Bld: 101 mg/dL — ABNORMAL HIGH (ref 70–99)
Potassium: 4.2 mmol/L (ref 3.5–5.1)
Sodium: 133 mmol/L — ABNORMAL LOW (ref 135–145)
Total Bilirubin: 1.1 mg/dL (ref 0.3–1.2)
Total Protein: 5.4 g/dL — ABNORMAL LOW (ref 6.5–8.1)

## 2019-02-02 LAB — CBC
HCT: 27.2 % — ABNORMAL LOW (ref 36.0–46.0)
Hemoglobin: 9 g/dL — ABNORMAL LOW (ref 12.0–15.0)
MCH: 31.5 pg (ref 26.0–34.0)
MCHC: 33.1 g/dL (ref 30.0–36.0)
MCV: 95.1 fL (ref 80.0–100.0)
Platelets: 197 10*3/uL (ref 150–400)
RBC: 2.86 MIL/uL — ABNORMAL LOW (ref 3.87–5.11)
RDW: 18.1 % — ABNORMAL HIGH (ref 11.5–15.5)
WBC: 6.6 10*3/uL (ref 4.0–10.5)
nRBC: 0 % (ref 0.0–0.2)

## 2019-02-02 LAB — MAGNESIUM: Magnesium: 1.6 mg/dL — ABNORMAL LOW (ref 1.7–2.4)

## 2019-02-02 MED ORDER — MAGNESIUM OXIDE 400 (241.3 MG) MG PO TABS
400.0000 mg | ORAL_TABLET | Freq: Once | ORAL | Status: AC
  Administered 2019-02-02: 400 mg via ORAL
  Filled 2019-02-02: qty 1

## 2019-02-02 MED ORDER — MAGNESIUM SULFATE 2 GM/50ML IV SOLN
2.0000 g | Freq: Once | INTRAVENOUS | Status: DC
  Administered 2019-02-02: 2 g via INTRAVENOUS
  Filled 2019-02-02 (×2): qty 50

## 2019-02-02 NOTE — Discharge Summary (Signed)
Physician Discharge Summary  Beth Daniels QMV:[id number] DOB: 1946-02-03 DOA: 01/29/2019  PCP: Shon Baton, MD  Admit date: 01/29/2019  Discharge date: 02/02/2019  Admitted From:Home  Disposition:  Home with home health  Recommendations for Outpatient Follow-up:  1. Follow up with PCP in 1-2 weeks 2. Follow-up with orthopedics as scheduled on 2 weeks 3. Continue medications as prescribed below  Home Health: Yes with PT  Equipment/Devices: Yes with walker, 3 and 1, and shower bench  Discharge Condition: Stable  CODE STATUS: Full  Diet recommendation: Heart Healthy  Brief/Interim Summary: 73 year old with history of osteoporosis, breast cancer status post mastectomy, chronic hyponatremia, COPD with chronic hypoxia on 2 L nasal cannula, alcohol abuse, peripheral vascular disease status post stent placement in the left lower extremity presented with mechanical fall due to poor gait from alcohol-induced neuropathy. Was found to have left hip fracture. Orthopedic was consulted.Patient underwent intramedullary nailing of her left hip on 7/26. Postoperatively developed anemia, likely blood loss therefore units PRBC transfusion ordered. She was noted to be deficient in vitamin D and thiamine.  Patient had received 2 units of PRBC transfusion on 7/26 given what appears to be iron deficiency anemia and postoperative anemia.  Her hemoglobin has remained above 9 and she has had no further overt bleeding identified.  She has been assessed by PT and OT with recommendations to go home with home health along with equipment as recommended above.  Her husband and the patient are in understanding and she appears to be stable for discharge today.  No other acute events noted during the course of this hospitalization patient will follow-up with orthopedics as noted in 2 weeks.  She was noted to have some mild vitamin D deficiency for which she has been started on vitamin D supplements.  Additionally she will  remain on aspirin and Plavix as recommended by orthopedics for DVT prophylaxis.  She has chronic hypoxemia and remains on 2 L nasal cannula with no further respiratory issues noted.  Discharge Diagnoses:  Principal Problem:   Closed left hip fracture ([place]) Active Problems:   COPD (chronic obstructive pulmonary disease) (HCC)   PVD (peripheral vascular disease) (HCC)   Alcohol dependence ([place])   Severe protein-calorie malnutrition ([place])   AKI (acute kidney injury) [facility])    Discharge Instructions  Discharge Instructions    Diet - low sodium heart healthy   Complete by: As directed    Diet - low sodium heart healthy   Complete by: As directed    Increase activity slowly   Complete by: As directed    Increase activity slowly   Complete by: As directed    Partial weight bearing   Complete by: As directed    % Body Weight: 50 % on Left   Laterality: left   Extremity: Lower   Shower chair   Complete by: As directed      Allergies as of 02/02/2019   No Known Allergies     Medication List    TAKE these medications   albuterol 108 (90 Base) MCG/ACT inhaler Commonly known as: VENTOLIN HFA Inhale 1 puff into the lungs every 4 (four) hours as needed for wheezing or shortness of breath.   aspirin 81 MG EC tablet Take 1 tablet (81 mg total) by mouth daily with breakfast.   atorvastatin 20 MG tablet Commonly known as: LIPITOR Take 20 mg by mouth daily.   azelastine 0.1 % nasal spray Commonly known as: ASTELIN Place 1 spray into both nostrils 2 (two)  times daily.   Calcium 500 + D 500-125 MG-UNIT Tabs Generic drug: Calcium Carbonate-Vitamin D Take 1 tablet by mouth daily.   camphor-menthol lotion Commonly known as: SARNA Apply 1 application topically as needed for itching.   clopidogrel 75 MG tablet Commonly known as: PLAVIX Take 75 mg by mouth daily.   conjugated estrogens vaginal cream Commonly known as: PREMARIN Place 1 Applicatorful vaginally daily as needed.    Cranberry 400 MG Caps Take 1 capsule by mouth daily.   docusate sodium 100 MG capsule Commonly known as: COLACE Take 1 capsule (100 mg total) by mouth 2 (two) times daily.   feeding supplement (ENSURE ENLIVE) Liqd Take 237 mLs by mouth 3 (three) times daily between meals.   Ferrous Fumarate 324 (106 Fe) MG Tabs tablet Commonly known as: HEMOCYTE - 106 mg FE Take 1 tablet by mouth every other day.   folic acid 1 MG tablet Commonly known as: FOLVITE Take 1 mg by mouth daily.   gabapentin 100 MG capsule Commonly known as: NEURONTIN Take 100 mg by mouth 3 (three) times daily.   gabapentin 300 MG capsule Commonly known as: NEURONTIN Take 300 mg by mouth 3 (three) times daily.   guaiFENesin 600 MG 12 hr tablet Commonly known as: MUCINEX Take 600 mg by mouth daily as needed for cough or to loosen phlegm.   HYDROcodone-acetaminophen 5-325 MG tablet Commonly known as: NORCO/VICODIN Take 1-2 tablets by mouth every 6 (six) hours as needed for up to 5 days for moderate pain or severe pain.   magnesium oxide 400 MG tablet Commonly known as: MAG-OX Take 400 mg by mouth daily.   Multi-Vitamin tablet Take 1 tablet by mouth daily.   mupirocin ointment 2 % Commonly known as: BACTROBAN Apply 1 application topically daily as needed.   nicotine polacrilex 4 MG lozenge Commonly known as: COMMIT Take 4 mg by mouth as needed for smoking cessation. 4-5 times daily   OXYGEN 2 liters at hs   pantoprazole 40 MG tablet Commonly known as: PROTONIX Take 40 mg by mouth daily.   Prolia 60 MG/ML Sosy injection Generic drug: denosumab Inject 60 mg into the skin every 6 (six) months.   umeclidinium-vilanterol 62.5-25 MCG/INH Aepb Commonly known as: ANORO ELLIPTA Inhale 1 puff into the lungs daily as needed.   Vitamin D (Ergocalciferol) 1.25 MG (50000 UT) Caps capsule Commonly known as: DRISDOL Take 1 capsule (50,000 Units total) by mouth every 7 (seven) days. Start taking on: February 07, 2019            Durable Medical Equipment  (From admission, onward)         Start     Ordered   02/01/19 0910  For home use only DME 4 wheeled rolling walker with seat  (Walkers)  Once    Question:  Patient needs a walker to treat with the following condition  Answer:  Weakness   02/01/19 0910   02/01/19 0910  DME 3-in-1  Once     02/01/19 0910   02/01/19 0910  DME tub bench  Once     02/01/19 0910   01/31/19 1633  For home use only DME 4 wheeled rolling walker with seat  Once    Question:  Patient needs a walker to treat with the following condition  Answer:  Intertrochanteric fracture of left hip [facility])   01/31/19 1635           Discharge Care Instructions  (From admission, onward)  Start     Ordered   01/30/19 0000  Partial weight bearing    Question Answer Comment  % Body Weight 50 % on Left   Laterality left   Extremity Lower      01/30/19 1340         Follow-up Information    Frederik Pear, MD. Schedule an appointment as soon as possible for a visit in 2 weeks.   Specialty: Orthopedic Surgery Contact information: [place] [address] [phone]        Shon Baton, MD Follow up in 1 week(s).   Specialty: Internal Medicine Contact information: [address] [place] [address] [phone]          No Known Allergies  Consultations:  Orthopedics   Procedures/Studies: Dg Chest 1 View  Result Date: 01/29/2019 CLINICAL DATA:  Left hip fracture.  Pre-op respiratory exam EXAM: CHEST  1 VIEW COMPARISON:  01/27/2019 FINDINGS: Heart size is normal. Aortic atherosclerosis. Pulmonary hyperinflation again seen, consistent with COPD. Both lungs are clear. IMPRESSION: COPD. No active disease. Electronically Signed   By: Marlaine Hind M.D.   On: 01/29/2019 14:58   Dg Ribs Unilateral W/chest Right  Result Date: 01/27/2019 CLINICAL DATA:  Fall, right rib tenderness. EXAM: RIGHT RIBS AND CHEST - 3+ VIEW COMPARISON:  None.  FINDINGS: There is hyperinflation of the lungs compatible with COPD. Heart is normal size. Lungs clear. Small right pleural effusion with blunting of the right costophrenic angle. No pneumothorax. No acute bony abnormality. No visible rib fracture. IMPRESSION: Small right pleural effusion. No visible pneumothorax or rib fracture. COPD. Electronically Signed   By: Rolm Baptise M.D.   On: 01/27/2019 11:43   Dg Lumbar Spine Complete  Result Date: 01/27/2019 CLINICAL DATA:  Pt fell on Sunday Low back pain Right ant rib pain EXAM: LUMBAR SPINE - COMPLETE 4+ VIEW COMPARISON:  None. FINDINGS: There is normal alignment of the lumbar spine. Disc height loss identified primarily at L2-3, L3-4. There is uncovertebral spurring and facet hypertrophy at these levels. No acute fracture or spondylolisthesis. Bowel gas pattern is nonobstructive. There is moderate atherosclerosis of the abdominal aorta. IMPRESSION: 1. Degenerative changes. 2. No evidence for acute abnormality. Electronically Signed   By: Nolon Nations M.D.   On: 01/27/2019 11:46   Dg Elbow 2 Views Left  Result Date: 01/29/2019 CLINICAL DATA:  Fall today.  Left elbow pain.  Initial encounter. EXAM: LEFT ELBOW - 2 VIEW COMPARISON:  None. FINDINGS: There is no evidence of fracture, dislocation, or joint effusion. There is no evidence of arthropathy or other focal bone abnormality. Soft tissues are unremarkable. IMPRESSION: Negative. Electronically Signed   By: Marlaine Hind M.D.   On: 01/29/2019 14:59   Ct Hip Left Wo Contrast  Result Date: 01/29/2019 CLINICAL DATA:  Left proximal femur fracture. Left hip pain secondary to a fall. EXAM: CT OF THE LEFT HIP WITHOUT CONTRAST TECHNIQUE: Multidetector CT imaging of the left hip was performed according to the standard protocol. Multiplanar CT image reconstructions were also generated. COMPARISON:  Radiographs dated 01/29/2019 FINDINGS: Bones/Joint/Cartilage There is a comminuted intertrochanteric fracture of the  proximal left femur with angulation and impaction. A large portion of the greater trochanter is avulsed and slightly displaced. There is no dislocation at the hip. Mild arthritic changes of the hip joint with joint space narrowing and small marginal osteophytes on the left femoral head. Visualized pelvic bones are intact. Soft tissues There is hemorrhage in the subcutaneous fat lateral to  the left hip as well as some hemorrhage into the adjacent musculature around the hip. IMPRESSION: Comminuted intertrochanteric fracture of the proximal left femur as described. Electronically Signed   By: Lorriane Shire M.D.   On: 01/29/2019 17:16   Dg Chest Port 1 View  Result Date: 01/31/2019 CLINICAL DATA:  Dyspnea, emphysema, COPD, RIGHT breast cancer, former smoker EXAM: PORTABLE CHEST 1 VIEW COMPARISON:  Portable exam 1312 hours compared to 01/29/2019 FINDINGS: Normal heart size, mediastinal contours, and pulmonary vascularity. Atherosclerotic calcification aorta. Emphysematous and bronchitic changes with mild biapical scarring consistent with COPD. Minimal bibasilar atelectasis. Bones demineralized. IMPRESSION: COPD changes with minimal bibasilar atelectasis. Electronically Signed   By: Lavonia Dana M.D.   On: 01/31/2019 13:38   Dg C-arm 1-60 Min  Result Date: 01/30/2019 CLINICAL DATA:  Intramedullary rod fixation of the left femur EXAM: DG C-ARM 61-120 MIN; LEFT FEMUR 2 VIEWS FLUOROSCOPY TIME:  3 minutes, 4 seconds COMPARISON:  Left hip radiograph-01/29/2019; left hip CT-01/29/2019 FINDINGS: 5 spot intraoperative fluoroscopic images of the left femur are provided for review. Provided images demonstrate the sequela intramedullary rod fixation of the left femur and dynamic screw fixation of the left femoral neck x2. The distal end of the femoral rod is transfixed with 2 cancellous screws. Note is made of an adjacent long segment vascular stent overlying expected location of the SFA. Expected adjacent soft tissue  swelling.  No radiopaque foreign body. IMPRESSION: Post intramedullary rod fixation of the left femur and femoral neck without evidence of complication. Electronically Signed   By: Sandi Mariscal M.D.   On: 01/30/2019 14:02   Dg Hip Unilat With Pelvis 2-3 Views Left  Result Date: 01/29/2019 CLINICAL DATA:  Fall today. Left hip pain.  Initial encounter. EXAM: DG HIP (WITH OR WITHOUT PELVIS) 2-3V LEFT COMPARISON:  None. FINDINGS: Mildly impacted and displaced intertrochanteric left hip fracture is seen. No evidence of hip dislocation. Mild left hip osteoarthritis noted. IMPRESSION: Intertrochanteric left hip fracture. Electronically Signed   By: Marlaine Hind M.D.   On: 01/29/2019 14:57   Dg Femur Min 2 Views Left  Result Date: 01/30/2019 CLINICAL DATA:  Intramedullary rod fixation of the left femur EXAM: DG C-ARM 61-120 MIN; LEFT FEMUR 2 VIEWS FLUOROSCOPY TIME:  3 minutes, 4 seconds COMPARISON:  Left hip radiograph-01/29/2019; left hip CT-01/29/2019 FINDINGS: 5 spot intraoperative fluoroscopic images of the left femur are provided for review. Provided images demonstrate the sequela intramedullary rod fixation of the left femur and dynamic screw fixation of the left femoral neck x2. The distal end of the femoral rod is transfixed with 2 cancellous screws. Note is made of an adjacent long segment vascular stent overlying expected location of the SFA. Expected adjacent soft tissue swelling.  No radiopaque foreign body. IMPRESSION: Post intramedullary rod fixation of the left femur and femoral neck without evidence of complication. Electronically Signed   By: Sandi Mariscal M.D.   On: 01/30/2019 14:02     Discharge Exam: Vitals:   02/01/19 2035 02/02/19 0516  BP: 114/61 122/73  Pulse: 94 83  Resp: 19 18  Temp: 98.5 F (36.9 C) 98.7 F (37.1 C)  SpO2: 98% 98%   Vitals:   02/01/19 0920 02/01/19 1159 02/01/19 2035 02/02/19 0516  BP: 102/66 122/63 114/61 122/73  Pulse: 89 87 94 83  Resp: 16  19 18   Temp:  98.6 F (37 C)  98.5 F (36.9 C) 98.7 F (37.1 C)  TempSrc: Oral  Oral Oral  SpO2: 96%  98%  98%  Weight:      Height:        General: Pt is alert, awake, not in acute distress Cardiovascular: RRR, S1/S2 +, no rubs, no gallops Respiratory: CTA bilaterally, no wheezing, no rhonchi Abdominal: Soft, NT, ND, bowel sounds + Extremities: no edema, no cyanosis Skin: Left hip dressing clean dry and intact.    The results of significant diagnostics from this hospitalization (including imaging, microbiology, ancillary and laboratory) are listed below for reference.     Microbiology: Recent Results (from the past 240 hour(s))  SARS Coronavirus 2 (CEPHEID - Performed in [place] hospital lab), Hosp Order     Status: None   Collection Time: 01/29/19  3:20 PM   Specimen: Nasopharyngeal Swab  Result Value Ref Range Status   SARS Coronavirus 2 NEGATIVE NEGATIVE Final    Comment: (NOTE) If result is NEGATIVE SARS-CoV-2 target nucleic acids are NOT DETECTED. The SARS-CoV-2 RNA is generally detectable in upper and lower  respiratory specimens during the acute phase of infection. The lowest  concentration of SARS-CoV-2 viral copies this assay can detect is 250  copies / mL. A negative result does not preclude SARS-CoV-2 infection  and should not be used as the sole basis for treatment or other  patient management decisions.  A negative result may occur with  improper specimen collection / handling, submission of specimen other  than nasopharyngeal swab, presence of viral mutation(s) within the  areas targeted by this assay, and inadequate number of viral copies  (<250 copies / mL). A negative result must be combined with clinical  observations, patient history, and epidemiological information. If result is POSITIVE SARS-CoV-2 target nucleic acids are DETECTED. The SARS-CoV-2 RNA is generally detectable in upper and lower  respiratory specimens dur ing the acute phase of infection.   Positive  results are indicative of active infection with SARS-CoV-2.  Clinical  correlation with patient history and other diagnostic information is  necessary to determine patient infection status.  Positive results do  not rule out bacterial infection or co-infection with other viruses. If result is PRESUMPTIVE POSTIVE SARS-CoV-2 nucleic acids MAY BE PRESENT.   A presumptive positive result was obtained on the submitted specimen  and confirmed on repeat testing.  While 2019 novel coronavirus  (SARS-CoV-2) nucleic acids may be present in the submitted sample  additional confirmatory testing may be necessary for epidemiological  and / or clinical management purposes  to differentiate between  SARS-CoV-2 and other Sarbecovirus currently known to infect humans.  If clinically indicated additional testing with an alternate test  methodology [phone]) is advised. The SARS-CoV-2 RNA is generally  detectable in upper and lower respiratory sp ecimens during the acute  phase of infection. The expected result is Negative. Fact Sheet for Patients:  StrictlyIdeas.no Fact Sheet for Healthcare Providers: BankingDealers.co.za This test is not yet approved or cleared by the [place] FDA and has been authorized for detection and/or diagnosis of SARS-CoV-2 by FDA under an Emergency Use Authorization (EUA).  This EUA will remain in effect (meaning this test can be used) for the duration of the COVID-19 declaration under Section 564(b)(1) of the Act, 21 U.S.C. section 360bbb-3(b)(1), unless the authorization is terminated or revoked sooner. Performed at [facility], [place] [address], [place], [place] [address]   Surgical pcr screen     Status: None   Collection Time: 01/30/19  6:20 AM   Specimen: Nasal Mucosa; Nasal Swab  Result Value Ref Range Status   MRSA,  PCR NEGATIVE NEGATIVE Final   Staphylococcus aureus NEGATIVE NEGATIVE Final     Comment: (NOTE) The Xpert SA Assay (FDA approved for NASAL specimens in patients 67 years of age and older), is one component of a comprehensive surveillance program. It is not intended to diagnose infection nor to guide or monitor treatment. Performed at [facility], [place] [address], [place], [place] [address]      Labs: BNP (last 3 results) Recent Labs    02/01/19 0349  BNP 073.7*   Basic Metabolic Panel: Recent Labs  Lab 01/29/19 1354 01/30/19 0312 01/31/19 0336 02/01/19 0349 02/02/19 0401  NA 132* 127* 129* 132* 133*  K 4.1 5.2* 4.6 4.4 4.2  CL 96* 94* 92* 95* 95*  CO2 21* 24 28 31 30   GLUCOSE 72 165* 130* 98 101*  BUN <5* 12 7* 5* 9  CREATININE 0.36* 0.81 0.45 0.37* 0.34*  CALCIUM 8.4* 8.4* 7.9* 8.4* 8.5*  MG  --   --  1.8 1.8 1.6*   Liver Function Tests: Recent Labs  Lab 01/31/19 0336 02/01/19 0349 02/02/19 0401  AST 19 19 19   ALT 13 12 11   ALKPHOS 53 66 75  BILITOT 0.6 1.0 1.1  PROT 5.1* 5.0* 5.4*  ALBUMIN 2.9* 2.9* 2.7*   No results for input(s): LIPASE, AMYLASE in the last 168 hours. No results for input(s): AMMONIA in the last 168 hours. CBC: Recent Labs  Lab 01/29/19 1354 01/30/19 0312 01/31/19 0336 01/31/19 1620 02/01/19 0349 02/02/19 0401  WBC 4.6 7.7 8.4  --  6.6 6.6  NEUTROABS 3.5  --   --   --   --   --   HGB 11.6* 8.2* 5.5* 9.1* 9.3* 9.0*  HCT 34.7* 23.9* 16.7* 26.9* 28.0* 27.2*  MCV 104.2* 101.3* 105.7*  --  95.2 95.1  PLT 279 219 166  --  153 197   Cardiac Enzymes: Recent Labs  Lab 01/30/19 0923  CKTOTAL 46   BNP: Invalid input(s): POCBNP CBG: No results for input(s): GLUCAP in the last 168 hours. D-Dimer No results for input(s): DDIMER in the last 72 hours. Hgb A1c No results for input(s): HGBA1C in the last 72 hours. Lipid Profile No results for input(s): CHOL, HDL, LDLCALC, TRIG, CHOLHDL, LDLDIRECT in the last 72 hours. Thyroid function studies Recent Labs    01/30/19 0923  TSH 2.703   Anemia work  up Recent Labs    01/30/19 0923  FERRITIN 49  TIBC 210*  IRON 29   Urinalysis No results found for: COLORURINE, APPEARANCEUR, LABSPEC, [place], GLUCOSEU, HGBUR, BILIRUBINUR, KETONESUR, PROTEINUR, UROBILINOGEN, NITRITE, LEUKOCYTESUR Sepsis Labs Invalid input(s): PROCALCITONIN,  WBC,  LACTICIDVEN Microbiology Recent Results (from the past 240 hour(s))  SARS Coronavirus 2 (CEPHEID - Performed in [place] hospital lab), Hosp Order     Status: None   Collection Time: 01/29/19  3:20 PM   Specimen: Nasopharyngeal Swab  Result Value Ref Range Status   SARS Coronavirus 2 NEGATIVE NEGATIVE Final    Comment: (NOTE) If result is NEGATIVE SARS-CoV-2 target nucleic acids are NOT DETECTED. The SARS-CoV-2 RNA is generally detectable in upper and lower  respiratory specimens during the acute phase of infection. The lowest  concentration of SARS-CoV-2 viral copies this assay can detect is 250  copies / mL. A negative result does not preclude SARS-CoV-2 infection  and should not be used as the sole basis for treatment or other  patient management decisions.  A negative result may occur with  improper specimen  collection / handling, submission of specimen other  than nasopharyngeal swab, presence of viral mutation(s) within the  areas targeted by this assay, and inadequate number of viral copies  (<250 copies / mL). A negative result must be combined with clinical  observations, patient history, and epidemiological information. If result is POSITIVE SARS-CoV-2 target nucleic acids are DETECTED. The SARS-CoV-2 RNA is generally detectable in upper and lower  respiratory specimens dur ing the acute phase of infection.  Positive  results are indicative of active infection with SARS-CoV-2.  Clinical  correlation with patient history and other diagnostic information is  necessary to determine patient infection status.  Positive results do  not rule out bacterial infection or co-infection with other  viruses. If result is PRESUMPTIVE POSTIVE SARS-CoV-2 nucleic acids MAY BE PRESENT.   A presumptive positive result was obtained on the submitted specimen  and confirmed on repeat testing.  While 2019 novel coronavirus  (SARS-CoV-2) nucleic acids may be present in the submitted sample  additional confirmatory testing may be necessary for epidemiological  and / or clinical management purposes  to differentiate between  SARS-CoV-2 and other Sarbecovirus currently known to infect humans.  If clinically indicated additional testing with an alternate test  methodology [phone]) is advised. The SARS-CoV-2 RNA is generally  detectable in upper and lower respiratory sp ecimens during the acute  phase of infection. The expected result is Negative. Fact Sheet for Patients:  StrictlyIdeas.no Fact Sheet for Healthcare Providers: BankingDealers.co.za This test is not yet approved or cleared by the [place] FDA and has been authorized for detection and/or diagnosis of SARS-CoV-2 by FDA under an Emergency Use Authorization (EUA).  This EUA will remain in effect (meaning this test can be used) for the duration of the COVID-19 declaration under Section 564(b)(1) of the Act, 21 U.S.C. section 360bbb-3(b)(1), unless the authorization is terminated or revoked sooner. Performed at [facility], [place] [address] [place], [place] [address]   Surgical pcr screen     Status: None   Collection Time: 01/30/19  6:20 AM   Specimen: Nasal Mucosa; Nasal Swab  Result Value Ref Range Status   MRSA, PCR NEGATIVE NEGATIVE Final   Staphylococcus aureus NEGATIVE NEGATIVE Final    Comment: (NOTE) The Xpert SA Assay (FDA approved for NASAL specimens in patients 64 years of age and older), is one component of a comprehensive surveillance program. It is not intended to diagnose infection nor to guide or monitor treatment. Performed at [facility], [place] [address], [place],  [address]      Time coordinating discharge: 35 minutes  SIGNED:   Rodena Goldmann, DO [facility] 02/02/2019, 8:13 AM  If 7PM-7AM, please contact night-coverage [email or web address] Password [email or web address]

## 2019-02-02 NOTE — Progress Notes (Signed)
PATIENT ID: Beth Daniels  MRN: [id number]  DOB/AGE:  September 24, 1945 / 73 y.o.  3 Days Post-Op Procedure(s) (LRB): INTRAMEDULLARY (IM) NAIL INTERTROCHANTRIC (Left)    PROGRESS NOTE Subjective:   Patient is alert, oriented, no Nausea, no Vomiting, yes passing gas, yes Bowel Movement. Taking PO well. Denies SOB, Chest or Calf Pain. Using Incentive Spirometer, PAS in place. Ambulate 50% weight bearing with pt walking 20 ft with therapy, Patient reports pain as moderate,     Objective: Vital signs in last 24 hours: Temp:  [98.2 F (36.8 C)-98.7 F (37.1 C)] 98.2 F (36.8 C) (07/29 0830) Pulse Rate:  [83-101] 101 (07/29 0830) Resp:  [16-19] 18 (07/29 0830) BP: (102-122)/(61-73) 115/61 (07/29 0830) SpO2:  [96 %-98 %] 98 % (07/29 0830)    Intake/Output from previous day: I/O last 3 completed shifts: In: 480 [P.O.:480] Out: 800 [Urine:800]   Intake/Output this shift: No intake/output data recorded.   LABORATORY DATA: Recent Labs    02/01/19 0349 02/02/19 0401  WBC 6.6 6.6  HGB 9.3* 9.0*  HCT 28.0* 27.2*  PLT 153 197  NA 132* 133*  K 4.4 4.2  CL 95* 95*  CO2 31 30  BUN 5* 9  CREATININE 0.37* 0.34*  GLUCOSE 98 101*  CALCIUM 8.4* 8.5*    Examination: Neurologically intact Neurovascular intact Sensation intact distally Intact pulses distally Dorsiflexion/Plantar flexion intact Incision: scant drainage No cellulitis present Compartment soft} Superior dressing is curling up.  Moderate bruising at incision site. Assessment:   3 Days Post-Op Procedure(s) (LRB): INTRAMEDULLARY (IM) NAIL INTERTROCHANTRIC (Left) ADDITIONAL DIAGNOSIS:  Renal Insufficiency Acute and PVD, Alcohol dependence  Plan:  Partial Weight Bearing @ 50% (PWB)  DVT Prophylaxis:  Aspirin and plavix  DISCHARGE PLAN: Home  DISCHARGE NEEDS: HHPT, Walker and 3-in-1 comode seat     Joanell Rising 02/02/2019, 8:34 AM

## 2019-02-02 NOTE — Progress Notes (Signed)
Discharge education reviewed at bedside with pt and pt spouse  Awaiting occupational therapy evaluation  Primary RN aware

## 2019-02-02 NOTE — Progress Notes (Signed)
Occupational Therapy Treatment Patient Details Name: Beth Daniels MRN: [id number] DOB: 05-18-1946 Today's Date: 02/02/2019    History of present illness Pt is 73 yo female who presents after mechanical fall in bathroom with L elbow pain (no fx) and L hip pain with intertrochanteric fx. Plan is for sx on 7/26. PMH: breast cancer with mastectomy, COPD, PVD, alcohol induced peripheral neuropathy   OT comments  Pt progressing towards OT goals. Pt spouse present for family education today; educated on compensatory techniques, activity progression and safety for completing ADL and functional transfers. Pt performing UB/LB dressing with spouse assist and therapist supervision today; pt requiring maxA for LB ADL and setup assist for seated UB ADL. Further reviewed safe transfer techniques to [facility] and shower with shower chair. Questions answered throughout and pt/pt's spouse verbalizing/return demonstrating good understanding. Pt anticipating d/c home today.   Follow Up Recommendations  Home health OT;Supervision/Assistance - 24 hour    Equipment Recommendations  3 in 1 bedside commode          Precautions / Restrictions Precautions Precautions: Fall Restrictions Weight Bearing Restrictions: Yes LLE Weight Bearing: Partial weight bearing LLE Partial Weight Bearing Percentage or Pounds: 50       Mobility Bed Mobility Overal bed mobility: Needs Assistance Bed Mobility: Supine to Sit     Supine to sit: Min assist     General bed mobility comments: pt received OOB in recliner  Transfers Overall transfer level: Needs assistance Equipment used: Rolling walker (2 wheeled) Transfers: Sit to/from Stand Sit to Stand: Min guard         General transfer comment: min cues for safe hand placement to/from seated surface; minguard for safety/balance    Balance Overall balance assessment: Needs assistance Sitting-balance support: Feet supported;Bilateral upper extremity supported Sitting  balance-Leahy Scale: Good Sitting balance - Comments: pt requires bilat UE support to elevate off L side of hip   Standing balance support: Bilateral upper extremity supported Standing balance-Leahy Scale: Poor Standing balance comment: dependent on RW                           ADL either performed or assessed with clinical judgement   ADL Overall ADL's : Needs assistance/impaired     Grooming: Set up;Sitting Grooming Details (indicate cue type and reason): pt brushing hair upon entry with setup assist         Upper Body Dressing : Set up;Sitting Upper Body Dressing Details (indicate cue type and reason): donning overhead shirt Lower Body Dressing: Maximal assistance;Sit to/from stand;With caregiver independent assisting Lower Body Dressing Details (indicate cue type and reason): pt's spuose assisting this session with LB dressing; assist to thread LEs and advance over hips; minguard for sit<>stand and standing balance; therapist providing close supervision/cues throughout           Tub/Shower Transfer Details (indicate cue type and reason): discussed/educated pt/pt's spouse on safe options for completing shower transfer to shower chair, demonstrated to both parties and issued corresponding handout Functional mobility during ADLs: Min guard;Rolling walker General ADL Comments: pt spouse present for family education today; educated on compensatory techniques, activity progression and safety for completing ADL and functional transfers                        Cognition Arousal/Alertness: Awake/alert Behavior During Therapy: Anxious Overall Cognitive Status: Within Functional Limits for tasks assessed  General Comments: pt very anxious regarding onset of pain with movement however becomes impulsive with movement        Exercises Total Joint Exercises Ankle Circles/Pumps: AROM;Both;20 reps;Supine Quad Sets:  AROM;Left;10 reps;Supine Short Arc Quad: AAROM;Left;10 reps;Supine Heel Slides: Left;10 reps;Supine;AAROM Hip ABduction/ADduction: Left;10 reps;Supine;AAROM   Shoulder Instructions       General Comments      Pertinent Vitals/ Pain       Pain Assessment: Faces Pain Score: 5  Faces Pain Scale: Hurts little more Pain Location: left leg (when move) Pain Descriptors / Indicators: Sharp Pain Intervention(s): Repositioned;Limited activity within patient's tolerance;Monitored during session  Home Living                                          Prior Functioning/Environment              Frequency  Min 2X/week        Progress Toward Goals  OT Goals(current goals can now be found in the care plan section)  Progress towards OT goals: Progressing toward goals  Acute Rehab OT Goals Patient Stated Goal: to go home OT Goal Formulation: With patient Time For Goal Achievement: 02/14/19 Potential to Achieve Goals: Good  Plan Discharge plan remains appropriate    Co-evaluation                 AM-PAC OT "6 Clicks" Daily Activity     Outcome Measure   Help from another person eating meals?: None Help from another person taking care of personal grooming?: A Little Help from another person toileting, which includes using toliet, bedpan, or urinal?: A Little Help from another person bathing (including washing, rinsing, drying)?: A Lot Help from another person to put on and taking off regular upper body clothing?: A Little Help from another person to put on and taking off regular lower body clothing?: A Lot 6 Click Score: 17    End of Session Equipment Utilized During Treatment: Rolling walker  OT Visit Diagnosis: Unsteadiness on feet (R26.81);Other abnormalities of gait and mobility (R26.89);History of falling (Z91.81);Pain Pain - Right/Left: Left Pain - part of body: Leg   Activity Tolerance Patient tolerated treatment well   Patient Left in  chair;with call bell/phone within reach;with family/visitor present   Nurse Communication Mobility status        Time: 6144-3154 OT Time Calculation (min): 32 min  Charges: OT General Charges $OT Visit: 1 Visit OT Treatments $Self Care/Home Management : 23-37 mins  Lou Cal, OT Supplemental Rehabilitation Services Pager [phone] Office [phone]    YOLINDA DUERR 02/02/2019, 2:06 PM

## 2019-02-02 NOTE — Plan of Care (Signed)
Pt educated on discharge planning for today.

## 2019-02-02 NOTE — Progress Notes (Signed)
Physical Therapy Treatment Patient Details Name: Beth Daniels MRN: [id number] DOB: 08/19/1945 Today's Date: 02/02/2019    History of Present Illness Pt is 73 yo female who presents after mechanical fall in bathroom with L elbow pain (no fx) and L hip pain with intertrochanteric fx. Plan is for sx on 7/26. PMH: breast cancer with mastectomy, COPD, PVD, alcohol induced peripheral neuropathy    PT Comments    Pt performed gt and exercises with her spouse present to observe for caregiver assistance at home.  Pt is doing well but continues to lack confidence,  Cues for safety during session.  Answered all questions and spouse participated in session.  Gt bel issued for home use.     Follow Up Recommendations  Home health PT;Supervision/Assistance - 24 hour     Equipment Recommendations  Rolling walker with 5" wheels;3in1 (PT)    Recommendations for Other Services       Precautions / Restrictions Precautions Precautions: Fall Restrictions Weight Bearing Restrictions: Yes LLE Weight Bearing: Partial weight bearing LLE Partial Weight Bearing Percentage or Pounds: 50    Mobility  Bed Mobility Overal bed mobility: Needs Assistance Bed Mobility: Supine to Sit     Supine to sit: Min assist     General bed mobility comments: Allowed husband to assist patient's LEs and provide assistance for trunk elevation.  Pt required increased time and effort.  Transfers Overall transfer level: Needs assistance                  Ambulation/Gait Ambulation/Gait assistance: Min guard Gait Distance (Feet): 20 Feet Assistive device: Rolling walker (2 wheeled)       General Gait Details: Cues for sequencing and maintaining PWB status   Stairs Stairs: Yes Stairs assistance: Min assist Stair Management: One rail Right;With cane Number of Stairs: 2 General stair comments: Cues for sequencing and cane placement.  Increased time but tolerated well.  Huband observed session for  carryover.   Wheelchair Mobility    Modified Rankin (Stroke Patients Only)       Balance Overall balance assessment: Needs assistance Sitting-balance support: Feet supported;Bilateral upper extremity supported Sitting balance-Leahy Scale: Good Sitting balance - Comments: pt requires bilat UE support to elevate off L side of hip   Standing balance support: Bilateral upper extremity supported Standing balance-Leahy Scale: Poor Standing balance comment: dependent on RW                            Cognition   Behavior During Therapy: Anxious Overall Cognitive Status: Within Functional Limits for tasks assessed                                 General Comments: pt very anxious regarding onset of pain with movement however becomes impulsive with movement      Exercises Total Joint Exercises Ankle Circles/Pumps: AROM;Both;20 reps;Supine Quad Sets: AROM;Left;10 reps;Supine Short Arc Quad: AAROM;Left;10 reps;Supine Heel Slides: Left;10 reps;Supine;AAROM Hip ABduction/ADduction: Left;10 reps;Supine;AAROM    General Comments        Pertinent Vitals/Pain Pain Assessment: 0-10 Pain Score: 5  Pain Location: left leg (when move) Pain Descriptors / Indicators: Sharp Pain Intervention(s): Monitored during session;Repositioned    Home Living                      Prior Function  PT Goals (current goals can now be found in the care plan section) Acute Rehab PT Goals Patient Stated Goal: to go home Potential to Achieve Goals: Good Progress towards PT goals: Progressing toward goals    Frequency    Min 5X/week      PT Plan Current plan remains appropriate    Co-evaluation              AM-PAC PT "6 Clicks" Mobility   Outcome Measure  Help needed turning from your back to your side while in a flat bed without using bedrails?: A Lot Help needed moving from lying on your back to sitting on the side of a flat bed  without using bedrails?: A Lot Help needed moving to and from a bed to a chair (including a wheelchair)?: A Little Help needed standing up from a chair using your arms (e.g., wheelchair or bedside chair)?: A Little Help needed to walk in hospital room?: A Little Help needed climbing 3-5 steps with a railing? : A Lot 6 Click Score: 15    End of Session Equipment Utilized During Treatment: Gait belt Activity Tolerance: Patient limited by pain Patient left: in chair;with call bell/phone within reach;with nursing/sitter in room Nurse Communication: Mobility status PT Visit Diagnosis: Unsteadiness on feet (R26.81);Pain Pain - Right/Left: Left Pain - part of body: Leg     Time: 5093-2671 PT Time Calculation (min) (ACUTE ONLY): 24 min  Charges:  $Gait Training: 8-22 mins $Therapeutic Exercise: 8-22 mins                     Governor Rooks, PTA Acute Rehabilitation Services Pager [phone] Office [phone]     Haelie Clapp [name] 02/02/2019, 1:06 PM

## 2019-02-03 ENCOUNTER — Institutional Professional Consult (permissible substitution): Payer: Medicare Other | Admitting: Pulmonary Disease

## 2019-02-07 ENCOUNTER — Other Ambulatory Visit: Payer: Self-pay | Admitting: Orthopedic Surgery

## 2019-02-07 DIAGNOSIS — Z9889 Other specified postprocedural states: Secondary | ICD-10-CM

## 2019-02-07 DIAGNOSIS — M79605 Pain in left leg: Secondary | ICD-10-CM

## 2019-02-08 ENCOUNTER — Ambulatory Visit
Admission: RE | Admit: 2019-02-08 | Discharge: 2019-02-08 | Disposition: A | Discharge status: Home / Self Care | Payer: Medicare Other | Source: Ambulatory Visit | Attending: Orthopedic Surgery | Admitting: Orthopedic Surgery

## 2019-02-08 DIAGNOSIS — Z9889 Other specified postprocedural states: Secondary | ICD-10-CM

## 2019-02-08 DIAGNOSIS — M79605 Pain in left leg: Secondary | ICD-10-CM

## 2019-02-17 ENCOUNTER — Telehealth: Payer: Self-pay | Admitting: Neurology

## 2019-02-17 ENCOUNTER — Other Ambulatory Visit: Payer: Self-pay

## 2019-02-17 DIAGNOSIS — I739 Peripheral vascular disease, unspecified: Secondary | ICD-10-CM

## 2019-02-17 MED ORDER — GABAPENTIN 300 MG PO CAPS: 300.0000 mg | ORAL_CAPSULE | Freq: Three times a day (TID) | ORAL | 11 refills | Status: DC

## 2019-02-17 MED ORDER — GABAPENTIN 100 MG PO CAPS: 100.0000 mg | ORAL_CAPSULE | Freq: Three times a day (TID) | ORAL | 12 refills | Status: DC

## 2019-02-17 NOTE — Telephone Encounter (Signed)
Patient needs to have a refill on the Gabapentin 100mg   Called in to [facility]

## 2019-02-17 NOTE — Telephone Encounter (Signed)
Husband left msg with after hours about wife needing a refill on the gabapentin. Thanks!

## 2019-02-17 NOTE — Telephone Encounter (Signed)
Gabapentin 100mg  and 300mg  TID with 1 year refill sent to pts pharmacy.

## 2019-02-21 ENCOUNTER — Telehealth: Payer: Self-pay | Admitting: Pulmonary Disease

## 2019-02-21 NOTE — Telephone Encounter (Signed)
Pt has a consult appt scheduled with AO tomorrow, 8/18.  Checked up front to see if we had received records from [facility] and I saw that we had received records. Called and spoke with pt letting her know that we had received records and she verbalized understanding. Nothing further needed.

## 2019-02-22 ENCOUNTER — Encounter: Payer: Self-pay | Admitting: Pulmonary Disease

## 2019-02-22 ENCOUNTER — Ambulatory Visit (INDEPENDENT_AMBULATORY_CARE_PROVIDER_SITE_OTHER): Payer: Medicare Other | Admitting: Pulmonary Disease

## 2019-02-22 ENCOUNTER — Other Ambulatory Visit: Payer: Self-pay

## 2019-02-22 DIAGNOSIS — R918 Other nonspecific abnormal finding of lung field: Secondary | ICD-10-CM

## 2019-02-22 DIAGNOSIS — J441 Chronic obstructive pulmonary disease with (acute) exacerbation: Secondary | ICD-10-CM | POA: Diagnosis not present

## 2019-02-22 NOTE — Patient Instructions (Signed)
Multiple lung nodules Chronic respiratory failure Chronic obstructive pulmonary disease  We will repeat CT scan November 2020 to follow-up lung nodules Continue inhaler and nebulization treatments  Continue physical activity Continue oxygen use as current  I will see you back about a week or 2 following the CT  Call with significant concerns

## 2019-02-22 NOTE — Progress Notes (Signed)
Ct                Beth Daniels    [id number]    Apr 23, 1946  Primary Care Physician:Russo, [name], MD  Referring Physician: Shon Baton, [address] [place],  [place] [id number]  Chief complaint:   Establishing care for chronic obstructive pulmonary disease, lung nodule  HPI:  She was following up at [facility] were sent over which was reviewed  She does have advanced chronic obstructive pulmonary disease, extensive emphysema Multiple lung nodules which have been followed for a couple years The last CT she had in May 2020 did reveal a new 4 mm nodule-the plan for this is to follow-up in 6 months Recently quit smoking He was recently hospitalized with a broken hip-rehabbing from this  She is using oxygen supplementation at about 2 L, mostly at night and more frequently following a recent fall  She does have peripheral arterial disease, significant peripheral neuropathy History of breast cancer-right breast in 2013  Compliant with nebulization use with albuterol and MDI use On Anoro long-term  Breathing has been relatively stable  She is limited with activities of daily living  Smoking history: Reformed smoker  Outpatient Encounter Medications as of 02/22/2019  Medication Sig  . albuterol (VENTOLIN HFA) 108 (90 Base) MCG/ACT inhaler Inhale 1 puff into the lungs every 4 (four) hours as needed for wheezing or shortness of breath.   [name] aspirin EC 81 MG EC tablet Take 1 tablet (81 mg total) by mouth daily with breakfast.  . azelastine (ASTELIN) 0.1 % nasal spray Place 1 spray into both nostrils 2 (two) times daily.   . Calcium Carbonate-Vitamin D (CALCIUM 500 + D) 500-125 MG-UNIT TABS Take 1 tablet by mouth daily.   . clopidogrel (PLAVIX) 75 MG tablet Take 75 mg by mouth daily.   [name] conjugated estrogens (PREMARIN) vaginal cream Place 1 Applicatorful vaginally daily as needed.   . Cranberry 400 MG CAPS Take 1 capsule by mouth daily.   [name] denosumab (PROLIA) 60 MG/ML SOSY  injection Inject 60 mg into the skin every 6 (six) months.   . docusate sodium (COLACE) 100 MG capsule Take 1 capsule (100 mg total) by mouth 2 (two) times daily.  . feeding supplement, ENSURE ENLIVE, (ENSURE ENLIVE) LIQD Take 237 mLs by mouth 3 (three) times daily between meals.  . Ferrous Fumarate (HEMOCYTE - 106 MG FE) 324 (106 Fe) MG TABS tablet Take 1 tablet by mouth every other day.   . folic acid (FOLVITE) 1 MG tablet Take 1 mg by mouth daily.   [name] gabapentin (NEURONTIN) 100 MG capsule Take 1 capsule (100 mg total) by mouth 3 (three) times daily.  [name] gabapentin (NEURONTIN) 300 MG capsule Take 1 capsule (300 mg total) by mouth 3 (three) times daily.  [name] guaiFENesin (MUCINEX) 600 MG 12 hr tablet Take 600 mg by mouth daily as needed for cough or to loosen phlegm.   . magnesium oxide (MAG-OX) 400 MG tablet Take 400 mg by mouth daily.   . Multiple Vitamin (MULTI-VITAMIN) tablet Take 1 tablet by mouth daily.   . mupirocin ointment (BACTROBAN) 2 % Apply 1 application topically daily as needed.   . nicotine polacrilex (COMMIT) 4 MG lozenge Take 4 mg by mouth as needed for smoking cessation. 4-5 times daily  . OXYGEN 2 liters at hs  . pantoprazole (PROTONIX) 40 MG tablet Take 40 mg by mouth daily.   [name] umeclidinium-vilanterol (ANORO ELLIPTA) 62.5-25 MCG/INH AEPB Inhale 1 puff  into the lungs daily as needed.   . Vitamin D, Ergocalciferol, (DRISDOL) 1.25 MG (50000 UT) CAPS capsule Take 1 capsule (50,000 Units total) by mouth every 7 (seven) days.  [name] atorvastatin (LIPITOR) 20 MG tablet Take 20 mg by mouth daily.    No facility-administered encounter medications on file as of 02/22/2019.     Allergies as of 02/22/2019  . (No Known Allergies)    Past Medical History:  Diagnosis Date  . Alcohol abuse   . Cancer [facility])    breast right  . Chronic hyponatremia   . COPD (chronic obstructive pulmonary disease) ([place])   . DCIS (ductal carcinoma in situ)   . Emphysema of lung ([place])   . Osteoporosis   .  Overactive bladder   . PVD (peripheral vascular disease) ([place])   . Tobacco use     Past Surgical History:  Procedure Laterality Date  . aeptoplasty    . cartaract extraction    . INTRAMEDULLARY (IM) NAIL INTERTROCHANTERIC Left 01/30/2019   Procedure: INTRAMEDULLARY (IM) NAIL INTERTROCHANTRIC;  Surgeon: Frederik Pear, MD;  Location: [facility];  Service: Orthopedics;  Laterality: Left;  . IR FIBRIN GLUE REPAIR ANAL FISTULA    . MASTECTOMY     s/p bl  . RETINAL DETACHMENT SURGERY    . sclerotherapy vein leg    . vitreous retinal surgery      Family History  Problem Relation Age of Onset  . Alzheimer's disease Mother   . Pneumonia Father        aspiration pneumonia    Social History   Socioeconomic History  . Marital status: Married    Spouse name: Not on file  . Number of children: Not on file  . Years of education: Not on file  . Highest education level: Not on file  Occupational History  . Occupation: RETIRED  Social Needs  . Financial resource strain: Not on file  . Food insecurity    Worry: Not on file    Inability: Not on file  . Transportation needs    Medical: Not on file    Non-medical: Not on file  Tobacco Use  . Smoking status: Former Smoker    Packs/day: 1.50    Years: 50.00    Pack years: 75.00    Types: Cigarettes    Start date: 71    Quit date: 10/06/2016    Years since quitting: 2.3  . Smokeless tobacco: Never Used  Substance and Sexual Activity  . Alcohol use: Yes    Alcohol/week: 28.0 standard drinks    Types: 28 Glasses of wine per week    Comment: 4 glasses wine a night  . Drug use: Not Currently  . Sexual activity: Not on file  Lifestyle  . Physical activity    Days per week: Not on file    Minutes per session: Not on file  . Stress: Not on file  Relationships  . Social Herbalist on phone: Not on file    Gets together: Not on file    Attends religious service: Not on file    Active member of club or organization: Not on file     Attends meetings of clubs or organizations: Not on file    Relationship status: Not on file  . Intimate partner violence    Fear of current or ex partner: Not on file    Emotionally abused: Not on file    Physically abused: Not on file  Forced sexual activity: Not on file  Other Topics Concern  . Not on file  Social History Narrative   Right handed, no children, one story with stairs. Lives with husband    Review of Systems  Constitutional: Positive for fatigue.  HENT: Negative.   Respiratory: Positive for shortness of breath.   Cardiovascular: Positive for leg swelling. Negative for chest pain.  Gastrointestinal: Negative.   Endocrine: Negative.   Genitourinary: Negative.   All other systems reviewed and are negative.   Vitals:   02/22/19 1344  BP: 116/60  Pulse: 94  Temp: 98.2 F (36.8 C)  SpO2: 100%     Physical Exam  Constitutional: She appears well-developed and well-nourished.  HENT:  Head: Normocephalic and atraumatic.  Eyes: Pupils are equal, round, and reactive to light. Right eye exhibits no discharge. Left eye exhibits no discharge.  Neck: Normal range of motion. Neck supple. No tracheal deviation present. No thyromegaly present.  Cardiovascular: Normal rate and regular rhythm.  Pulmonary/Chest: Effort normal and breath sounds normal. No respiratory distress. She has no wheezes. She has no rales.  Abdominal: Soft. Bowel sounds are normal. She exhibits no distension. There is no abdominal tenderness.   Data Reviewed: Records from [place] reviewed CT scan report reviewed  Assessment:  Chronic obstructive pulmonary disease  Emphysema  Debility  Recent femur fracture  Multiple lung nodules with a new nodule noted recently, six-month follow-up of nodule is recommended  Plan/Recommendations: Continue oxygen supplementation  Continue Anoro  Continue albuterol use  Continue to maintain physical activity  Call with any significant  concerns  Obtain CT scan of the chest to follow-up on lung nodules   Sherrilyn Rist MD [facility] 02/22/2019, 2:29 PM  CC: Shon Baton, MD

## 2019-02-25 ENCOUNTER — Telehealth (HOSPITAL_COMMUNITY): Payer: Self-pay | Admitting: Rehabilitation

## 2019-02-25 NOTE — Telephone Encounter (Signed)

## 2019-02-28 ENCOUNTER — Other Ambulatory Visit (HOSPITAL_COMMUNITY): Payer: Self-pay

## 2019-02-28 ENCOUNTER — Other Ambulatory Visit: Payer: Self-pay

## 2019-02-28 ENCOUNTER — Ambulatory Visit (INDEPENDENT_AMBULATORY_CARE_PROVIDER_SITE_OTHER): Payer: Medicare Other | Admitting: Surgery

## 2019-02-28 ENCOUNTER — Encounter: Payer: Self-pay | Admitting: *Deleted

## 2019-02-28 ENCOUNTER — Encounter: Payer: Self-pay | Admitting: Surgery

## 2019-02-28 ENCOUNTER — Ambulatory Visit (INDEPENDENT_AMBULATORY_CARE_PROVIDER_SITE_OTHER)
Admission: RE | Admit: 2019-02-28 | Discharge: 2019-02-28 | Disposition: A | Discharge status: Home / Self Care | Payer: Medicare Other | Source: Ambulatory Visit | Attending: Surgery | Admitting: Surgery

## 2019-02-28 ENCOUNTER — Ambulatory Visit (HOSPITAL_COMMUNITY)
Admission: RE | Admit: 2019-02-28 | Discharge: 2019-02-28 | Disposition: A | Discharge status: Home / Self Care | Payer: Medicare Other | Source: Ambulatory Visit | Attending: Surgery | Admitting: Surgery

## 2019-02-28 ENCOUNTER — Other Ambulatory Visit: Payer: Self-pay | Admitting: *Deleted

## 2019-02-28 VITALS — BP 94/50 | HR 85 | Temp 97.5°F | Resp 20 | Ht 62.0 in | Wt 91.0 lb

## 2019-02-28 DIAGNOSIS — I70213 Atherosclerosis of native arteries of extremities with intermittent claudication, bilateral legs: Secondary | ICD-10-CM

## 2019-02-28 DIAGNOSIS — I739 Peripheral vascular disease, unspecified: Secondary | ICD-10-CM | POA: Diagnosis not present

## 2019-02-28 MED ORDER — GABAPENTIN 100 MG PO CAPS: 100.0000 mg | ORAL_CAPSULE | Freq: Three times a day (TID) | ORAL | 1 refills | Status: DC

## 2019-02-28 NOTE — Progress Notes (Signed)
Vascular and Vein Specialist of [facility]  Patient name: Beth Daniels MRN: [id number] DOB: March 09, 1946 Sex: female   REQUESTING PROVIDER:    Dr. Virgina Jock   REASON FOR CONSULT:    PAD  HISTORY OF PRESENT ILLNESS:   Beth Daniels is a 73 y.o. female, who is transferring her vascular care from Dr. Laverta Baltimore at [facility] to me because she has recently moved to this area.  She has a history of peripheral vascular disease having undergone left superficial femoral artery stenting on 10/2016.  She then underwent drug-coated balloon angioplasty for in-stent stenosis on 09/2017 and then on 05/2018, Cutting Balloon angioplasty of the left superficial femoral artery and balloon angioplasty of the right external iliac artery.  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  Currently, she denies claudication or rest pain.  She does not have any open wounds.  The patient is on dual antiplatelet therapy with aspirin and Plavix.  She takes a statin for hypercholesterolemia.  She is a former smoker.  She has COPD and is on home oxygen.  She has recently fallen and broken her hip which was surgically repaired.  PAST MEDICAL HISTORY    Past Medical History:  Diagnosis Date  . Alcohol abuse   . Cancer [facility])    breast right  . Chronic hyponatremia   . COPD (chronic obstructive pulmonary disease) ([place])   . DCIS (ductal carcinoma in situ)   . Emphysema of lung ([place])   . Osteoporosis   . Overactive bladder   . PVD (peripheral vascular disease) ([place])   . Tobacco use      FAMILY HISTORY   Family History  Problem Relation Age of Onset  . Alzheimer's disease Mother   . Pneumonia Father        aspiration pneumonia    SOCIAL HISTORY:   Social History   Socioeconomic History  . Marital status: Married    Spouse name: Not on file  . Number of children: Not on file  . Years of education: Not on file  . Highest education level:  Not on file  Occupational History  . Occupation: RETIRED  Social Needs  . Financial resource strain: Not on file  . Food insecurity    Worry: Not on file    Inability: Not on file  . Transportation needs    Medical: Not on file    Non-medical: Not on file  Tobacco Use  . Smoking status: Former Smoker    Packs/day: 1.50    Years: 50.00    Pack years: 75.00    Types: Cigarettes    Start date: 22    Quit date: 10/06/2016    Years since quitting: 2.3  . Smokeless tobacco: Never Used  Substance and Sexual Activity  . Alcohol use: Yes    Alcohol/week: 28.0 standard drinks    Types: 28 Glasses of wine per week    Comment: 4 glasses wine a night  . Drug use: Not Currently  . Sexual activity: Not on file  Lifestyle  . Physical activity    Days per week: Not on file    Minutes per session: Not on file  . Stress: Not on file  Relationships  . Social Herbalist on phone: Not on file    Gets together: Not on file    Attends religious service: Not on file    Active member of club or organization: Not on file  Attends meetings of clubs or organizations: Not on file    Relationship status: Not on file  . Intimate partner violence    Fear of current or ex partner: Not on file    Emotionally abused: Not on file    Physically abused: Not on file    Forced sexual activity: Not on file  Other Topics Concern  . Not on file  Social History Narrative   Right handed, no children, one story with stairs. Lives with husband    ALLERGIES:    No Known Allergies  CURRENT MEDICATIONS:    Current Outpatient Medications  Medication Sig Dispense Refill  . albuterol (VENTOLIN HFA) 108 (90 Base) MCG/ACT inhaler Inhale 1 puff into the lungs every 4 (four) hours as needed for wheezing or shortness of breath.     [name] aspirin EC 81 MG EC tablet Take 1 tablet (81 mg total) by mouth daily with breakfast. 30 tablet 2  . azelastine (ASTELIN) 0.1 % nasal spray Place 1 spray into both  nostrils 2 (two) times daily.     . Calcium Carbonate-Vitamin D (CALCIUM 500 + D) 500-125 MG-UNIT TABS Take 1 tablet by mouth daily.     . clopidogrel (PLAVIX) 75 MG tablet Take 75 mg by mouth daily.     [name] conjugated estrogens (PREMARIN) vaginal cream Place 1 Applicatorful vaginally daily as needed.     . Cranberry 400 MG CAPS Take 1 capsule by mouth daily.     [name] denosumab (PROLIA) 60 MG/ML SOSY injection Inject 60 mg into the skin every 6 (six) months.     . docusate sodium (COLACE) 100 MG capsule Take 1 capsule (100 mg total) by mouth 2 (two) times daily. 10 capsule 0  . feeding supplement, ENSURE ENLIVE, (ENSURE ENLIVE) LIQD Take 237 mLs by mouth 3 (three) times daily between meals. 237 mL 12  . Ferrous Fumarate (HEMOCYTE - 106 MG FE) 324 (106 Fe) MG TABS tablet Take 1 tablet by mouth every other day.     . folic acid (FOLVITE) 1 MG tablet Take 1 mg by mouth daily.     [name] gabapentin (NEURONTIN) 100 MG capsule Take 1 capsule (100 mg total) by mouth 3 (three) times daily. 90 capsule 12  . gabapentin (NEURONTIN) 300 MG capsule Take 1 capsule (300 mg total) by mouth 3 (three) times daily. 90 capsule 11  . guaiFENesin (MUCINEX) 600 MG 12 hr tablet Take 600 mg by mouth daily as needed for cough or to loosen phlegm.     . magnesium oxide (MAG-OX) 400 MG tablet Take 400 mg by mouth daily.     . Multiple Vitamin (MULTI-VITAMIN) tablet Take 1 tablet by mouth daily.     . mupirocin ointment (BACTROBAN) 2 % Apply 1 application topically daily as needed.     . nicotine polacrilex (COMMIT) 4 MG lozenge Take 4 mg by mouth as needed for smoking cessation. 4-5 times daily    . OXYGEN 2 liters at hs    . pantoprazole (PROTONIX) 40 MG tablet Take 40 mg by mouth daily.     [name] umeclidinium-vilanterol (ANORO ELLIPTA) 62.5-25 MCG/INH AEPB Inhale 1 puff into the lungs daily as needed.     [name] atorvastatin (LIPITOR) 20 MG tablet Take 20 mg by mouth daily.     [name] gabapentin (NEURONTIN) 100 MG capsule Take 1 capsule (100 mg  total) by mouth 3 (three) times daily. Pt is taken now 2 100mg  tid 180 capsule 1   No current  facility-administered medications for this visit.     REVIEW OF SYSTEMS:   [X]  denotes positive finding, [ ]  denotes negative finding Cardiac  Comments:  Chest pain or chest pressure:    Shortness of breath upon exertion:    Short of breath when lying flat:    Irregular heart rhythm:        Vascular    Pain in calf, thigh, or hip brought on by ambulation: x   Pain in feet at night that wakes you up from your sleep:     Blood clot in your veins:    Leg swelling:  x       Pulmonary    Oxygen at home: x   Productive cough:     Wheezing:         Neurologic    Sudden weakness in arms or legs:     Sudden numbness in arms or legs:     Sudden onset of difficulty speaking or slurred speech:    Temporary loss of vision in one eye:     Problems with dizziness:         Gastrointestinal    Blood in stool:      Vomited blood:         Genitourinary    Burning when urinating:     Blood in urine:        Psychiatric    Major depression:         Hematologic    Bleeding problems:    Problems with blood clotting too easily:        Skin    Rashes or ulcers:        Constitutional    Fever or chills:     PHYSICAL EXAM:   Vitals:   02/28/19 1035  BP: (!) 94/50  Pulse: 85  Resp: 20  Temp: (!) 97.5 F (36.4 C)  SpO2: 99%  Weight: 91 lb (41.3 kg)  Height: 5\' 2"  (1.575 m)    GENERAL: The patient is a well-nourished female, in no acute distress. The vital signs are documented above. CARDIAC: There is a regular rate and rhythm.  VASCULAR: non-palpable pedal pulses aware she is blood pressure PULMONARY: Nonlabored respirations ABDOMEN: Soft and non-tender with normal pitched bowel sounds.  MUSCULOSKELETAL: There are no major deformities or cyanosis. NEUROLOGIC: No focal weakness or paresthesias are detected. SKIN: There are no ulcers or rashes noted. PSYCHIATRIC: The patient has a  normal affect.  STUDIES:   I have reviewed the following: +-------+-----------+-----------+------------+------------+ ABI/TBIToday's ABIToday's TBIPrevious ABIPrevious TBI +-------+-----------+-----------+------------+------------+ Right  1.15       0.64                                +-------+-----------+-----------+------------+------------+ Left   0.55       0.00                                +-------+-----------+-----------+------------+------------+ Right toe = 76 Left toe = 0   Duplex shows significant elevated velocities around the left superficial femoral artery stent ASSESSMENT and PLAN   I had an extensive discussion with the patient and her husband.  She has had a left superficial femoral artery stent placed at [facility] and has had several re-interventions.  Her duplex today shows that she has recurrent stenosis.  I will plan on repeating her arteriogram and intervening  as indicated.  Previously she has had drug-coated balloon angioplasty as well as Cutting Balloon angioplasty.  This time I will consider drug-eluting stent placement versus covered stent placement and possibly atherectomy.  She understands that she is getting limited durability with her percutaneous interventions and she may require surgical bypass.  Alternatively, because she is limited in her mobility by her pulmonary disease and home oxygen, she may be able to get by without any additional intervention.  I have scheduled angiogram for Tuesday, September 8.  She will continue her aspirin and Plavix.   Leia Alf, MD, FACS Vascular and Vein Specialists of [facility] [phone] Pager [phone]

## 2019-02-28 NOTE — H&P (View-Only) (Signed)
Vascular and Vein Specialist of [facility]  Patient name: Beth Daniels MRN: [id number] DOB: November 01, 1945 Sex: female   REQUESTING PROVIDER:    Dr. Virgina Jock   REASON FOR CONSULT:    PAD  HISTORY OF PRESENT ILLNESS:   Beth Daniels is a 72 y.o. female, who is transferring her vascular care from Dr. Laverta Baltimore at [facility] to me because she has recently moved to this area.  She has a history of peripheral vascular disease having undergone left superficial femoral artery stenting on 10/2016.  She then underwent drug-coated balloon angioplasty for in-stent stenosis on 09/2017 and then on 05/2018, Cutting Balloon angioplasty of the left superficial femoral artery and balloon angioplasty of the right external iliac artery.  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  Currently, she denies claudication or rest pain.  She does not have any open wounds.  The patient is on dual antiplatelet therapy with aspirin and Plavix.  She takes a statin for hypercholesterolemia.  She is a former smoker.  She has COPD and is on home oxygen.  She has recently fallen and broken her hip which was surgically repaired.  PAST MEDICAL HISTORY    Past Medical History:  Diagnosis Date  . Alcohol abuse   . Cancer [facility])    breast right  . Chronic hyponatremia   . COPD (chronic obstructive pulmonary disease) ([place])   . DCIS (ductal carcinoma in situ)   . Emphysema of lung ([place])   . Osteoporosis   . Overactive bladder   . PVD (peripheral vascular disease) ([place])   . Tobacco use      FAMILY HISTORY   Family History  Problem Relation Age of Onset  . Alzheimer's disease Mother   . Pneumonia Father        aspiration pneumonia    SOCIAL HISTORY:   Social History   Socioeconomic History  . Marital status: Married    Spouse name: Not on file  . Number of children: Not on file  . Years of education: Not on file  . Highest education level:  Not on file  Occupational History  . Occupation: RETIRED  Social Needs  . Financial resource strain: Not on file  . Food insecurity    Worry: Not on file    Inability: Not on file  . Transportation needs    Medical: Not on file    Non-medical: Not on file  Tobacco Use  . Smoking status: Former Smoker    Packs/day: 1.50    Years: 50.00    Pack years: 75.00    Types: Cigarettes    Start date: 85    Quit date: 10/06/2016    Years since quitting: 2.3  . Smokeless tobacco: Never Used  Substance and Sexual Activity  . Alcohol use: Yes    Alcohol/week: 28.0 standard drinks    Types: 28 Glasses of wine per week    Comment: 4 glasses wine a night  . Drug use: Not Currently  . Sexual activity: Not on file  Lifestyle  . Physical activity    Days per week: Not on file    Minutes per session: Not on file  . Stress: Not on file  Relationships  . Social Herbalist on phone: Not on file    Gets together: Not on file    Attends religious service: Not on file    Active member of club or organization: Not on file  Attends meetings of clubs or organizations: Not on file    Relationship status: Not on file  . Intimate partner violence    Fear of current or ex partner: Not on file    Emotionally abused: Not on file    Physically abused: Not on file    Forced sexual activity: Not on file  Other Topics Concern  . Not on file  Social History Narrative   Right handed, no children, one story with stairs. Lives with husband    ALLERGIES:    No Known Allergies  CURRENT MEDICATIONS:    Current Outpatient Medications  Medication Sig Dispense Refill  . albuterol (VENTOLIN HFA) 108 (90 Base) MCG/ACT inhaler Inhale 1 puff into the lungs every 4 (four) hours as needed for wheezing or shortness of breath.     [name] aspirin EC 81 MG EC tablet Take 1 tablet (81 mg total) by mouth daily with breakfast. 30 tablet 2  . azelastine (ASTELIN) 0.1 % nasal spray Place 1 spray into both  nostrils 2 (two) times daily.     . Calcium Carbonate-Vitamin D (CALCIUM 500 + D) 500-125 MG-UNIT TABS Take 1 tablet by mouth daily.     . clopidogrel (PLAVIX) 75 MG tablet Take 75 mg by mouth daily.     [name] conjugated estrogens (PREMARIN) vaginal cream Place 1 Applicatorful vaginally daily as needed.     . Cranberry 400 MG CAPS Take 1 capsule by mouth daily.     [name] denosumab (PROLIA) 60 MG/ML SOSY injection Inject 60 mg into the skin every 6 (six) months.     . docusate sodium (COLACE) 100 MG capsule Take 1 capsule (100 mg total) by mouth 2 (two) times daily. 10 capsule 0  . feeding supplement, ENSURE ENLIVE, (ENSURE ENLIVE) LIQD Take 237 mLs by mouth 3 (three) times daily between meals. 237 mL 12  . Ferrous Fumarate (HEMOCYTE - 106 MG FE) 324 (106 Fe) MG TABS tablet Take 1 tablet by mouth every other day.     . folic acid (FOLVITE) 1 MG tablet Take 1 mg by mouth daily.     [name] gabapentin (NEURONTIN) 100 MG capsule Take 1 capsule (100 mg total) by mouth 3 (three) times daily. 90 capsule 12  . gabapentin (NEURONTIN) 300 MG capsule Take 1 capsule (300 mg total) by mouth 3 (three) times daily. 90 capsule 11  . guaiFENesin (MUCINEX) 600 MG 12 hr tablet Take 600 mg by mouth daily as needed for cough or to loosen phlegm.     . magnesium oxide (MAG-OX) 400 MG tablet Take 400 mg by mouth daily.     . Multiple Vitamin (MULTI-VITAMIN) tablet Take 1 tablet by mouth daily.     . mupirocin ointment (BACTROBAN) 2 % Apply 1 application topically daily as needed.     . nicotine polacrilex (COMMIT) 4 MG lozenge Take 4 mg by mouth as needed for smoking cessation. 4-5 times daily    . OXYGEN 2 liters at hs    . pantoprazole (PROTONIX) 40 MG tablet Take 40 mg by mouth daily.     [name] umeclidinium-vilanterol (ANORO ELLIPTA) 62.5-25 MCG/INH AEPB Inhale 1 puff into the lungs daily as needed.     [name] atorvastatin (LIPITOR) 20 MG tablet Take 20 mg by mouth daily.     [name] gabapentin (NEURONTIN) 100 MG capsule Take 1 capsule (100 mg  total) by mouth 3 (three) times daily. Pt is taken now 2 100mg  tid 180 capsule 1   No current  facility-administered medications for this visit.     REVIEW OF SYSTEMS:   [X]  denotes positive finding, [ ]  denotes negative finding Cardiac  Comments:  Chest pain or chest pressure:    Shortness of breath upon exertion:    Short of breath when lying flat:    Irregular heart rhythm:        Vascular    Pain in calf, thigh, or hip brought on by ambulation: x   Pain in feet at night that wakes you up from your sleep:     Blood clot in your veins:    Leg swelling:  x       Pulmonary    Oxygen at home: x   Productive cough:     Wheezing:         Neurologic    Sudden weakness in arms or legs:     Sudden numbness in arms or legs:     Sudden onset of difficulty speaking or slurred speech:    Temporary loss of vision in one eye:     Problems with dizziness:         Gastrointestinal    Blood in stool:      Vomited blood:         Genitourinary    Burning when urinating:     Blood in urine:        Psychiatric    Major depression:         Hematologic    Bleeding problems:    Problems with blood clotting too easily:        Skin    Rashes or ulcers:        Constitutional    Fever or chills:     PHYSICAL EXAM:   Vitals:   02/28/19 1035  BP: (!) 94/50  Pulse: 85  Resp: 20  Temp: (!) 97.5 F (36.4 C)  SpO2: 99%  Weight: 91 lb (41.3 kg)  Height: 5\' 2"  (1.575 m)    GENERAL: The patient is a well-nourished female, in no acute distress. The vital signs are documented above. CARDIAC: There is a regular rate and rhythm.  VASCULAR: non-palpable pedal pulses aware she is blood pressure PULMONARY: Nonlabored respirations ABDOMEN: Soft and non-tender with normal pitched bowel sounds.  MUSCULOSKELETAL: There are no major deformities or cyanosis. NEUROLOGIC: No focal weakness or paresthesias are detected. SKIN: There are no ulcers or rashes noted. PSYCHIATRIC: The patient has a  normal affect.  STUDIES:   I have reviewed the following: +-------+-----------+-----------+------------+------------+ ABI/TBIToday's ABIToday's TBIPrevious ABIPrevious TBI +-------+-----------+-----------+------------+------------+ Right  1.15       0.64                                +-------+-----------+-----------+------------+------------+ Left   0.55       0.00                                +-------+-----------+-----------+------------+------------+ Right toe = 76 Left toe = 0   Duplex shows significant elevated velocities around the left superficial femoral artery stent ASSESSMENT and PLAN   I had an extensive discussion with the patient and her husband.  She has had a left superficial femoral artery stent placed at [facility] and has had several re-interventions.  Her duplex today shows that she has recurrent stenosis.  I will plan on repeating her arteriogram and intervening  as indicated.  Previously she has had drug-coated balloon angioplasty as well as Cutting Balloon angioplasty.  This time I will consider drug-eluting stent placement versus covered stent placement and possibly atherectomy.  She understands that she is getting limited durability with her percutaneous interventions and she may require surgical bypass.  Alternatively, because she is limited in her mobility by her pulmonary disease and home oxygen, she may be able to get by without any additional intervention.  I have scheduled angiogram for Tuesday, September 8.  She will continue her aspirin and Plavix.   Leia Alf, MD, FACS Vascular and Vein Specialists of [facility] [phone] Pager [phone]

## 2019-03-01 ENCOUNTER — Other Ambulatory Visit: Payer: Self-pay

## 2019-03-01 MED ORDER — GABAPENTIN 100 MG PO CAPS: 100.0000 mg | ORAL_CAPSULE | Freq: Three times a day (TID) | ORAL | 1 refills | Status: DC

## 2019-03-11 ENCOUNTER — Other Ambulatory Visit (HOSPITAL_COMMUNITY)
Admission: RE | Admit: 2019-03-11 | Discharge: 2019-03-11 | Disposition: A | Discharge status: Home / Self Care | Payer: Medicare Other | Source: Ambulatory Visit | Attending: Surgery | Admitting: Surgery

## 2019-03-11 DIAGNOSIS — Z01812 Encounter for preprocedural laboratory examination: Secondary | ICD-10-CM | POA: Insufficient documentation

## 2019-03-11 DIAGNOSIS — Z20828 Contact with and (suspected) exposure to other viral communicable diseases: Secondary | ICD-10-CM | POA: Diagnosis not present

## 2019-03-11 LAB — SARS CORONAVIRUS 2 (TAT 6-24 HRS): SARS Coronavirus 2: NEGATIVE

## 2019-03-15 ENCOUNTER — Ambulatory Visit (HOSPITAL_COMMUNITY)
Admission: RE | Admit: 2019-03-15 | Discharge: 2019-03-15 | Disposition: A | Discharge status: Home / Self Care | Payer: Medicare Other | Attending: Surgery | Admitting: Surgery

## 2019-03-15 ENCOUNTER — Encounter (HOSPITAL_COMMUNITY)
Admission: RE | Disposition: A | Discharge status: Home / Self Care | Payer: Self-pay | Source: Home / Self Care | Attending: Surgery

## 2019-03-15 ENCOUNTER — Encounter (HOSPITAL_COMMUNITY): Payer: Self-pay | Admitting: Surgery

## 2019-03-15 ENCOUNTER — Other Ambulatory Visit: Payer: Self-pay

## 2019-03-15 DIAGNOSIS — T82858A Stenosis of vascular prosthetic devices, implants and grafts, initial encounter: Secondary | ICD-10-CM

## 2019-03-15 DIAGNOSIS — Z7902 Long term (current) use of antithrombotics/antiplatelets: Secondary | ICD-10-CM | POA: Insufficient documentation

## 2019-03-15 DIAGNOSIS — Z87891 Personal history of nicotine dependence: Secondary | ICD-10-CM | POA: Diagnosis not present

## 2019-03-15 DIAGNOSIS — J449 Chronic obstructive pulmonary disease, unspecified: Secondary | ICD-10-CM | POA: Diagnosis not present

## 2019-03-15 DIAGNOSIS — Z7982 Long term (current) use of aspirin: Secondary | ICD-10-CM | POA: Diagnosis not present

## 2019-03-15 DIAGNOSIS — E7800 Pure hypercholesterolemia, unspecified: Secondary | ICD-10-CM | POA: Diagnosis not present

## 2019-03-15 DIAGNOSIS — I739 Peripheral vascular disease, unspecified: Secondary | ICD-10-CM | POA: Diagnosis not present

## 2019-03-15 DIAGNOSIS — Z79899 Other long term (current) drug therapy: Secondary | ICD-10-CM | POA: Diagnosis not present

## 2019-03-15 HISTORY — PX: PERIPHERAL VASCULAR INTERVENTION: CATH118257

## 2019-03-15 HISTORY — PX: PERIPHERAL VASCULAR THROMBECTOMY: CATH118306

## 2019-03-15 HISTORY — PX: ABDOMINAL AORTOGRAM W/LOWER EXTREMITY: CATH118223

## 2019-03-15 LAB — POCT I-STAT, CHEM 8
BUN: 8 mg/dL (ref 8–23)
Calcium, Ion: 1.21 mmol/L (ref 1.15–1.40)
Chloride: 95 mmol/L — ABNORMAL LOW (ref 98–111)
Creatinine, Ser: 0.4 mg/dL — ABNORMAL LOW (ref 0.44–1.00)
Glucose, Bld: 91 mg/dL (ref 70–99)
HCT: 33 % — ABNORMAL LOW (ref 36.0–46.0)
Hemoglobin: 11.2 g/dL — ABNORMAL LOW (ref 12.0–15.0)
Potassium: 4 mmol/L (ref 3.5–5.1)
Sodium: 135 mmol/L (ref 135–145)
TCO2: 32 mmol/L (ref 22–32)

## 2019-03-15 LAB — POCT ACTIVATED CLOTTING TIME: Activated Clotting Time: 230 seconds

## 2019-03-15 SURGERY — ABDOMINAL AORTOGRAM W/LOWER EXTREMITY
Anesthesia: LOCAL

## 2019-03-15 MED ORDER — NITROGLYCERIN 1 MG/10 ML FOR IR/CATH LAB
INTRA_ARTERIAL | Status: AC
  Filled 2019-03-15: qty 10

## 2019-03-15 MED ORDER — MIDAZOLAM HCL 2 MG/2ML IJ SOLN
INTRAMUSCULAR | Status: DC | PRN
  Administered 2019-03-15 (×3): 1 mg via INTRAVENOUS

## 2019-03-15 MED ORDER — HEPARIN (PORCINE) IN NACL 1000-0.9 UT/500ML-% IV SOLN
INTRAVENOUS | Status: AC
  Filled 2019-03-15: qty 1000

## 2019-03-15 MED ORDER — SODIUM CHLORIDE 0.9 % IV SOLN
INTRAVENOUS | Status: DC
  Administered 2019-03-15: 08:00:00 via INTRAVENOUS

## 2019-03-15 MED ORDER — ONDANSETRON HCL 4 MG/2ML IJ SOLN: 4.0000 mg | Freq: Four times a day (QID) | INTRAMUSCULAR | Status: DC | PRN

## 2019-03-15 MED ORDER — HEPARIN SODIUM (PORCINE) 1000 UNIT/ML IJ SOLN
INTRAMUSCULAR | Status: DC | PRN
  Administered 2019-03-15: 2000 [IU] via INTRAVENOUS
  Administered 2019-03-15: 4000 [IU] via INTRAVENOUS
  Administered 2019-03-15: 1000 [IU] via INTRAVENOUS

## 2019-03-15 MED ORDER — SODIUM CHLORIDE 0.9% FLUSH: 3.0000 mL | INTRAVENOUS | Status: DC | PRN

## 2019-03-15 MED ORDER — OXYCODONE HCL 5 MG PO TABS: 5.0000 mg | ORAL_TABLET | ORAL | Status: DC | PRN

## 2019-03-15 MED ORDER — LIDOCAINE HCL (PF) 1 % IJ SOLN
INTRAMUSCULAR | Status: DC | PRN
  Administered 2019-03-15: 10 mL

## 2019-03-15 MED ORDER — SODIUM CHLORIDE 0.9 % IV SOLN: 250.0000 mL | INTRAVENOUS | Status: DC | PRN

## 2019-03-15 MED ORDER — FENTANYL CITRATE (PF) 100 MCG/2ML IJ SOLN
INTRAMUSCULAR | Status: DC | PRN
  Administered 2019-03-15: 50 ug via INTRAVENOUS
  Administered 2019-03-15 (×2): 25 ug via INTRAVENOUS

## 2019-03-15 MED ORDER — MIDAZOLAM HCL 2 MG/2ML IJ SOLN
INTRAMUSCULAR | Status: AC
  Filled 2019-03-15: qty 2

## 2019-03-15 MED ORDER — ACETAMINOPHEN 325 MG PO TABS: 650.0000 mg | ORAL_TABLET | ORAL | Status: DC | PRN

## 2019-03-15 MED ORDER — HYDRALAZINE HCL 20 MG/ML IJ SOLN: 5.0000 mg | INTRAMUSCULAR | Status: DC | PRN

## 2019-03-15 MED ORDER — MORPHINE SULFATE (PF) 10 MG/ML IV SOLN: 2.0000 mg | INTRAVENOUS | Status: DC | PRN

## 2019-03-15 MED ORDER — FENTANYL CITRATE (PF) 100 MCG/2ML IJ SOLN
INTRAMUSCULAR | Status: AC
  Filled 2019-03-15: qty 2

## 2019-03-15 MED ORDER — LIDOCAINE HCL (PF) 1 % IJ SOLN
INTRAMUSCULAR | Status: AC
  Filled 2019-03-15: qty 30

## 2019-03-15 MED ORDER — LABETALOL HCL 5 MG/ML IV SOLN: 10.0000 mg | INTRAVENOUS | Status: DC | PRN

## 2019-03-15 MED ORDER — IODIXANOL 320 MG/ML IV SOLN
INTRAVENOUS | Status: DC | PRN
  Administered 2019-03-15: 180 mL via INTRA_ARTERIAL

## 2019-03-15 MED ORDER — SODIUM CHLORIDE 0.9 % WEIGHT BASED INFUSION: 1.0000 mL/kg/h | INTRAVENOUS | Status: DC

## 2019-03-15 MED ORDER — HEPARIN (PORCINE) IN NACL 1000-0.9 UT/500ML-% IV SOLN
INTRAVENOUS | Status: DC | PRN
  Administered 2019-03-15: 500 mL

## 2019-03-15 MED ORDER — SODIUM CHLORIDE 0.9% FLUSH: 3.0000 mL | Freq: Two times a day (BID) | INTRAVENOUS | Status: DC

## 2019-03-15 MED ORDER — NITROGLYCERIN 1 MG/10 ML FOR IR/CATH LAB
INTRA_ARTERIAL | Status: DC | PRN
  Administered 2019-03-15: 400 ug via INTRA_ARTERIAL
  Administered 2019-03-15: 600 ug via INTRA_ARTERIAL
  Administered 2019-03-15: 300 ug via INTRA_ARTERIAL

## 2019-03-15 SURGICAL SUPPLY — 26 items
BALLN MUSTANG 5.0X40 135 (BALLOONS) ×3
BALLOON MUSTANG 5.0X40 135 (BALLOONS) IMPLANT
CANISTER PENUMBRA ENGINE (MISCELLANEOUS) ×1 IMPLANT
CATH ANGIO 5F BER2 100CM (CATHETERS) ×2 IMPLANT
CATH INDIGO CAT6 KIT (CATHETERS) ×1 IMPLANT
CATH OMNI FLUSH 5F 65CM (CATHETERS) ×1 IMPLANT
CATH QUICKCROSS SUPP .035X90CM (MICROCATHETER) ×1 IMPLANT
CLOSURE MYNX CONTROL 6F/7F (Vascular Products) ×1 IMPLANT
DEVICE TORQUE H2O (MISCELLANEOUS) ×1 IMPLANT
GUIDEWIRE ANGLED .035X150CM (WIRE) ×1 IMPLANT
KIT ENCORE 26 ADVANTAGE (KITS) ×1 IMPLANT
KIT MICROPUNCTURE NIT STIFF (SHEATH) ×1 IMPLANT
KIT PV (KITS) ×3 IMPLANT
SHEATH FLEX ANSEL ST 6FR 45CM (SHEATH) ×1 IMPLANT
SHEATH PINNACLE 5F 10CM (SHEATH) ×1 IMPLANT
SHEATH PINNACLE 6F 10CM (SHEATH) ×1 IMPLANT
SHEATH PINNACLE 7F 10CM (SHEATH) ×1 IMPLANT
SHEATH PINNACLE ST 7F 45CM (SHEATH) ×1 IMPLANT
SHEATH PROBE COVER 6X72 (BAG) ×1 IMPLANT
STENT ELUVIA 6X40X130 (Permanent Stent) ×1 IMPLANT
STENT ELUVIA 6X80X130 (Permanent Stent) ×1 IMPLANT
SYR MEDRAD MARK V 150ML (SYRINGE) ×1 IMPLANT
TRANSDUCER W/STOPCOCK (MISCELLANEOUS) ×3 IMPLANT
TRAY PV CATH (CUSTOM PROCEDURE TRAY) ×3 IMPLANT
WIRE BENTSON .035X145CM (WIRE) ×1 IMPLANT
WIRE HI TORQ VERSACORE 300 (WIRE) ×1 IMPLANT

## 2019-03-15 NOTE — Interval H&P Note (Signed)
History and Physical Interval Note:  03/15/2019 7:42 AM  Beth Daniels  has presented today for surgery, with the diagnosis of pvd.  The various methods of treatment have been discussed with the patient and family. After consideration of risks, benefits and other options for treatment, the patient has consented to  Procedure(s): ABDOMINAL AORTOGRAM W/LOWER EXTREMITY (N/A) as a surgical intervention.  The patient's history has been reviewed, patient examined, no change in status, stable for surgery.  I have reviewed the patient's chart and labs.  Questions were answered to the patient's satisfaction.     Annamarie Major

## 2019-03-15 NOTE — Op Note (Signed)
Patient name: Beth Daniels MRN: [id number] DOB: [birth year] Sex: female  03/15/2019 Pre-operative Diagnosis: In-stent stenosis, left leg Post-operative diagnosis:  Same Surgeon:  Annamarie Major Procedure Performed:  1.  Ultrasound-guided access, right femoral artery  2.  Abdominal aortogram  3.  Bilateral lower extremity runoff  4.  Drug-eluting stent, left superficial femoral artery  5.  Mechanical thrombectomy, left anterior tibial artery  6.  Mechanical thrombectomy, left posterior tibial artery  7.  Intra-arterial administration of nitroglycerin  8.  Conscious sedation (113 minutes)  9.  Closure device (Mynx)   Indications: The patient has undergone multiple percutaneous interventions at [facility].  Her most recent ultrasound suggested recurrent in-stent stenosis.  She is here today for further evaluation and possible treatment.  Procedure:  The patient was identified in the holding area and taken to room 8.  The patient was then placed supine on the table and prepped and draped in the usual sterile fashion.  A time out was called.  Conscious sedation was administered with the use of IV fentanyl and Versed under continuous physician and nurse monitoring.  Heart rate, blood pressure, and oxygen saturation were continuously monitored.  Total sedation time was 113 minutes.  Ultrasound was used to evaluate the right common femoral artery.  It was patent .  A digital ultrasound image was acquired.  A micropuncture needle was used to access the right common femoral artery under ultrasound guidance.  An 018 wire was advanced without resistance and a micropuncture sheath was placed.  The 018 wire was removed and a benson wire was placed.  The micropuncture sheath was exchanged for a 5 french sheath.  An omniflush catheter was advanced over the wire to the level of L-1.  An abdominal angiogram was obtained.  Next, using the omniflush catheter and a benson wire, the aortic bifurcation was crossed and  the catheter was placed into theleft external iliac artery and left runoff was obtained.  right runoff was performed via retrograde sheath injections.  Findings:   Aortogram: No significant renal artery stenosis.  The infrarenal abdominal aorta is small in caliber but patent without significant stenosis.  There is a stent within the right common iliac artery which is widely patent.  The right external iliac artery and left common and external iliac artery remain patent without stenosis.  Right Lower Extremity: The right common femoral profundofemoral artery are widely patent.  The superficial femoral artery and popliteal artery to the knee are widely patent.  Distal images were not obtained given contrast volume for intervention  Left Lower Extremity: Left common femoral and profundofemoral artery are widely patent.  There is a stent within the superficial femoral artery with approximately a 90% stenosis at the proximal extent and near occlusion of the distal extent.  The popliteal artery is patent throughout its course.  There is three-vessel runoff to the ankle and the posterior tibial is the dominant vessel across the ankle.  Intervention: After the above images were acquired the decision was made to proceed with intervention.  Over a 035 wire, a 6 French sheath was inserted.  The patient was fully heparinized using a quick cross catheter and a Glidewire, I was able to navigate across the lesions and get wire access into the popliteal artery.  The Glidewire was exchanged out for a versa core wire.  I placed a 6 x 80 Elluvia at the distal end of the previously placed stents and a 6 x 40 Elluvia at the proximal  end of the previously placed stents.  The stents were molded with a 5 mm balloon.  Completion imaging was performed which showed widely patent intervention and inline flow down to the popliteal artery.  Upon additional imaging, it appeared to be a filling defect within the anterior tibial artery and  posterior tibial artery near their origins.  I suspected this was embolic debris.  I injected 600 mcg of nitroglycerin with a modest improvement.  I then set up for mechanical thrombectomy.  I switched out for a 7 [place] Terumo sheath.  I used a Berenstein 2 catheter and a versa core wire to select the anterior tibial artery and then used a penumbra CAT 6 device to perform mechanical thrombectomy of the anterior tibial artery.  I then selected the posterior tibial artery and repeated the process with a CAT 6 device of mechanical thrombectomy in the posterior tibial artery.  I then re-selected the anterior tibial and posterior tibial artery and injected 300 mcg of nitroglycerin into each vessel.  Completion imaging was then performed which showed return of the runoff that was present prior to intervention.  Procedure was terminated at this time.  Minx device was used for closure  Impression:  #1  Greater than 80% stenosis at the proximal extent of her previous stents and near occlusion at the distal extent.  I placed a 6 x 80 Elluvia distally and 6 x 40 proximally with resolution of the stenosis  #2  Distal embolization after intervention treated with mechanical thrombectomy using the penumbra CAT 6 device of the anterior tibial and posterior tibial artery with complete resolution of the embolization   V. Annamarie Major, M.D., [facility] [place] Office: [phone] Pager:  [phone]

## 2019-03-15 NOTE — Discharge Instructions (Signed)

## 2019-03-16 ENCOUNTER — Telehealth: Payer: Self-pay | Admitting: *Deleted

## 2019-03-16 NOTE — Telephone Encounter (Signed)
Return call to patient and reviewed/answered home care instructions post Angiogram.

## 2019-03-18 ENCOUNTER — Encounter (HOSPITAL_COMMUNITY)
Admission: RE | Admit: 2019-03-18 | Discharge: 2019-03-18 | Disposition: A | Discharge status: Home / Self Care | Payer: Medicare Other | Source: Ambulatory Visit | Attending: Internal Medicine | Admitting: Internal Medicine

## 2019-03-18 ENCOUNTER — Other Ambulatory Visit: Payer: Self-pay

## 2019-03-18 DIAGNOSIS — M810 Age-related osteoporosis without current pathological fracture: Secondary | ICD-10-CM | POA: Insufficient documentation

## 2019-03-18 MED ORDER — DENOSUMAB 60 MG/ML ~~LOC~~ SOSY
PREFILLED_SYRINGE | SUBCUTANEOUS | Status: AC
  Filled 2019-03-18: qty 1

## 2019-03-18 MED ORDER — DENOSUMAB 60 MG/ML ~~LOC~~ SOSY
60.0000 mg | PREFILLED_SYRINGE | Freq: Once | SUBCUTANEOUS | Status: AC
  Administered 2019-03-18: 60 mg via SUBCUTANEOUS

## 2019-04-04 ENCOUNTER — Other Ambulatory Visit: Payer: Self-pay

## 2019-04-04 MED ORDER — GABAPENTIN 300 MG PO CAPS: 300.0000 mg | ORAL_CAPSULE | Freq: Three times a day (TID) | ORAL | 3 refills | Status: DC

## 2019-04-20 ENCOUNTER — Other Ambulatory Visit: Payer: Self-pay | Admitting: Pulmonary Disease

## 2019-04-20 ENCOUNTER — Ambulatory Visit
Admission: RE | Admit: 2019-04-20 | Discharge: 2019-04-20 | Disposition: A | Discharge status: Home / Self Care | Payer: Self-pay | Source: Ambulatory Visit | Attending: Pulmonary Disease | Admitting: Pulmonary Disease

## 2019-04-20 DIAGNOSIS — R918 Other nonspecific abnormal finding of lung field: Secondary | ICD-10-CM

## 2019-04-21 ENCOUNTER — Other Ambulatory Visit: Payer: Self-pay

## 2019-04-21 DIAGNOSIS — I70213 Atherosclerosis of native arteries of extremities with intermittent claudication, bilateral legs: Secondary | ICD-10-CM

## 2019-04-21 DIAGNOSIS — I739 Peripheral vascular disease, unspecified: Secondary | ICD-10-CM

## 2019-04-22 ENCOUNTER — Telehealth (HOSPITAL_COMMUNITY): Payer: Self-pay | Admitting: *Deleted

## 2019-04-22 NOTE — Telephone Encounter (Signed)
The above patient or their representative was contacted and gave the following answers to these questions:         Do you have any of the following symptoms?    NO  Fever                    Cough                   Shortness of breath  Do  you have any of the following other symptoms? n   muscle pain         vomiting,        diarrhea        rash         weakness        red eye        abdominal pain         bruising          bruising or bleeding              joint pain           severe headache    Have you been in contact with someone who was or has been sick in the past 2 weeks?  NO  Yes                 Unsure                         Unable to assess   Does the person that you were in contact with have any of the following symptoms?   Cough         shortness of breath           muscle pain         vomiting,            diarrhea            rash            weakness           fever            red eye           abdominal pain           bruising  or  bleeding                joint pain                severe headache                 COMMENTS OR ACTION PLAN FOR THIS PATIENT:         q

## 2019-04-25 ENCOUNTER — Encounter: Payer: Self-pay | Admitting: Family

## 2019-04-25 ENCOUNTER — Ambulatory Visit (HOSPITAL_COMMUNITY)
Admission: RE | Admit: 2019-04-25 | Discharge: 2019-04-25 | Disposition: A | Discharge status: Home / Self Care | Payer: Medicare Other | Source: Ambulatory Visit | Attending: Family | Admitting: Family

## 2019-04-25 ENCOUNTER — Ambulatory Visit (INDEPENDENT_AMBULATORY_CARE_PROVIDER_SITE_OTHER)
Admission: RE | Admit: 2019-04-25 | Discharge: 2019-04-25 | Disposition: A | Discharge status: Home / Self Care | Payer: Medicare Other | Source: Ambulatory Visit | Attending: Family | Admitting: Family

## 2019-04-25 ENCOUNTER — Ambulatory Visit (INDEPENDENT_AMBULATORY_CARE_PROVIDER_SITE_OTHER): Payer: Medicare Other | Admitting: Family

## 2019-04-25 ENCOUNTER — Other Ambulatory Visit: Payer: Self-pay

## 2019-04-25 VITALS — BP 127/65 | HR 83 | Temp 99.4°F | Resp 16 | Ht 62.0 in | Wt 95.0 lb

## 2019-04-25 DIAGNOSIS — I70213 Atherosclerosis of native arteries of extremities with intermittent claudication, bilateral legs: Secondary | ICD-10-CM

## 2019-04-25 DIAGNOSIS — Z87891 Personal history of nicotine dependence: Secondary | ICD-10-CM

## 2019-04-25 DIAGNOSIS — I739 Peripheral vascular disease, unspecified: Secondary | ICD-10-CM

## 2019-04-25 DIAGNOSIS — I779 Disorder of arteries and arterioles, unspecified: Secondary | ICD-10-CM

## 2019-04-25 NOTE — Progress Notes (Addendum)
[facility] [place]   CC: Follow up peripheral artery occlusive disease  History of Present Illness Beth Daniels is a 73 y.o. female who is s/p abdominal aortogram with bilateral lower extremity runoff, drug-eluting stent placed in left superficial femoral artery, mechanical thrombectomy of left anterior tibial artery, mechanical thrombectomy of left posterior tibial artery, and intra-arterial administration of nitroglycerin on 03-15-19 by Dr. Trula Slade for in-stent stenosis of left leg.  The patient has undergone multiple percutaneous interventions at [facility].     She returns today for 1 month post op visit.   She fell in July 2020 and fractured her left hip and femur, had surgical repair of this in July 2020.   Diabetic: No Tobacco use: former smoker, quit in 2018, started in 1970  Pt meds include: Statin :Yes Betablocker: No ASA: Yes Other anticoagulants/antiplatelets: Plavix  Past Medical History:  Diagnosis Date  . Alcohol abuse   . Cancer [facility])    breast right  . Chronic hyponatremia   . COPD (chronic obstructive pulmonary disease) ([place])   . DCIS (ductal carcinoma in situ)   . Emphysema of lung ([place])   . Osteoporosis   . Overactive bladder   . PVD (peripheral vascular disease) ([place])   . Tobacco use     Social History Social History   Tobacco Use  . Smoking status: Former Smoker    Packs/day: 1.50    Years: 50.00    Pack years: 75.00    Types: Cigarettes    Start date: 55    Quit date: 10/06/2016    Years since quitting: 2.5  . Smokeless tobacco: Never Used  Substance Use Topics  . Alcohol use: Yes    Alcohol/week: 28.0 standard drinks    Types: 28 Glasses of wine per week    Comment: 4 glasses wine a night  . Drug use: Not Currently    Family History Family History  Problem Relation Age of Onset  . Alzheimer's disease Mother   . Pneumonia Father        aspiration pneumonia    Past Surgical History:  Procedure Laterality  Date  . ABDOMINAL AORTOGRAM W/LOWER EXTREMITY N/A 03/15/2019   Procedure: ABDOMINAL AORTOGRAM W/LOWER EXTREMITY;  Surgeon: Serafina Mitchell, MD;  Location: [place] [facility];  Service: Cardiovascular;  Laterality: N/A;  . aeptoplasty    . cartaract extraction    . INTRAMEDULLARY (IM) NAIL INTERTROCHANTERIC Left 01/30/2019   Procedure: INTRAMEDULLARY (IM) NAIL INTERTROCHANTRIC;  Surgeon: Frederik Pear, MD;  Location: [place];  Service: Orthopedics;  Laterality: Left;  . IR FIBRIN GLUE REPAIR ANAL FISTULA    . MASTECTOMY     s/p bl  . PERIPHERAL VASCULAR INTERVENTION  03/15/2019   Procedure: PERIPHERAL VASCULAR INTERVENTION;  Surgeon: Serafina Mitchell, MD;  Location: [place] [facility];  Service: Cardiovascular;;  Lt. SFA  . PERIPHERAL VASCULAR THROMBECTOMY  03/15/2019   Procedure: PERIPHERAL VASCULAR THROMBECTOMY;  Surgeon: Serafina Mitchell, MD;  Location: [place] [facility];  Service: Cardiovascular;;  Lt. AT, PT  . RETINAL DETACHMENT SURGERY    . sclerotherapy vein leg    . vitreous retinal surgery      No Known Allergies  Current Outpatient Medications  Medication Sig Dispense Refill  . albuterol (PROVENTIL) (2.5 MG/3ML) 0.083% nebulizer solution Take 2.5 mg by nebulization every 4 (four) hours as needed for wheezing or shortness of breath.    [name] albuterol (VENTOLIN HFA) 108 (90 Base) MCG/ACT inhaler Inhale 1  puff into the lungs every 6 (six) hours as needed for wheezing or shortness of breath.     [name] aspirin EC 81 MG tablet Take 81 mg by mouth daily.    [name] azelastine (ASTELIN) 0.1 % nasal spray Place 1 spray into both nostrils 2 (two) times daily.     . Calcium Carbonate-Vitamin D (CALCIUM 500 + D) 500-125 MG-UNIT TABS Take 1 tablet by mouth daily.     . clopidogrel (PLAVIX) 75 MG tablet Take 75 mg by mouth at bedtime.     . conjugated estrogens (PREMARIN) vaginal cream Place 1 Applicatorful vaginally every other day.     . Cranberry 400 MG CAPS Take 400 mg by mouth daily at 2 PM.     .  denosumab (PROLIA) 60 MG/ML SOSY injection Inject 60 mg into the skin every 6 (six) months.     . docusate sodium (COLACE) 100 MG capsule Take 1 capsule (100 mg total) by mouth 2 (two) times daily. (Patient taking differently: Take 100 mg by mouth at bedtime. ) 10 capsule 0  . feeding supplement, ENSURE ENLIVE, (ENSURE ENLIVE) LIQD Take 237 mLs by mouth 3 (three) times daily between meals. 237 mL 12  . Ferrous Fumarate (HEMOCYTE - 106 MG FE) 324 (106 Fe) MG TABS tablet Take 1 tablet by mouth daily at 3 pm.     . fluticasone (FLONASE) 50 MCG/ACT nasal spray Place 2 sprays into both nostrils 2 (two) times daily.    . folic acid (FOLVITE) 1 MG tablet Take 1 mg by mouth daily.    [name] gabapentin (NEURONTIN) 300 MG capsule Take 1 capsule (300 mg total) by mouth 3 (three) times daily. 270 capsule 3  . guaiFENesin (MUCINEX) 600 MG 12 hr tablet Take 600 mg by mouth 2 (two) times daily.     . magnesium oxide (MAG-OX) 400 MG tablet Take 400 mg by mouth daily.     . Multiple Vitamin (MULTI-VITAMIN) tablet Take 1 tablet by mouth daily.     . mupirocin ointment (BACTROBAN) 2 % Apply 1 application topically daily as needed (wound care).     . nicotine polacrilex (COMMIT) 4 MG lozenge Take 4 mg by mouth as needed for smoking cessation.     . OXYGEN 2 liters at hs    . pantoprazole (PROTONIX) 40 MG tablet Take 40 mg by mouth at bedtime.     . sodium chloride (OCEAN) 0.65 % SOLN nasal spray Place 1 spray into both nostrils as needed for congestion.    [name] umeclidinium-vilanterol (ANORO ELLIPTA) 62.5-25 MCG/INH AEPB Inhale 1 puff into the lungs daily.     . Wheat Dextrin (BENEFIBER) POWD Take 1 Dose by mouth daily.    [name] atorvastatin (LIPITOR) 20 MG tablet Take 20 mg by mouth at bedtime.      No current facility-administered medications for this visit.     ROS: See HPI for pertinent positives and negatives.   Physical Examination  Vitals:   04/25/19 1317  BP: 127/65  Pulse: 83  Resp: 16  Temp: 99.4 F (37.4  C)  TempSrc: Temporal  SpO2: 97%  Weight: 95 lb (43.1 kg)  Height: 5\' 2"  (1.575 m)   Body mass index is 17.38 kg/m.  General: A&O x 3, WDWN, thin elderly female accompanied by her husband. Gait: steady, using rolling walker with a seat HENT: No gross abnormalities.  Eyes: Pupils are equal  Pulmonary: Respirations are non labored, fair air movement in all fields, no rales, rhonchi,  or wheezes. Supplemental O2 via [place] at 2L/min Cardiac: regular rhythm, no detected murmur.         Carotid Bruits Right Left   Negative Negative   Radial pulses are 2+ palpable bilaterally   Adominal aortic pulse is 2+ palpable (pt is thin, no abdominal aortic aneurysm noted on abdominal aortogram)                        VASCULAR EXAM: Extremities without ischemic changes, without Gangrene; without open wounds.                                                                                                          LE Pulses Right Left       FEMORAL  3+ palpable  3+ palpable        POPLITEAL  1+ palpable   1+ palpable       POSTERIOR TIBIAL  not palpable   not palpable        DORSALIS PEDIS      ANTERIOR TIBIAL not palpable  1-2+ palpable    Abdomen: soft, NT, no palpable masses. Skin: no rashes, no cellulitis, no ulcers noted. Musculoskeletal: no muscle wasting or atrophy.  Neurologic: A&O X 3; appropriate affect, Sensation is normal; MOTOR FUNCTION:  moving all extremities equally, motor strength 5/5 throughout. Speech is fluent/normal. CN 2-12 intact. Psychiatric: Thought content is normal, mood appropriate for clinical situation.      DATA  Left LE Arterial Duplex (04-25-19): LEFT      PSV cm/sRatioStenosisWaveformComments +----------+--------+-----+--------+--------+--------+ EIA Distal203                  biphasic         +----------+--------+-----+--------+--------+--------+ CFA Prox  304                  biphasic          +----------+--------+-----+--------+--------+--------+ DFA       130                  biphasic         +----------+--------+-----+--------+--------+--------+ SFA Prox  121                  biphasic         +----------+--------+-----+--------+--------+--------+ POP Mid   82                   biphasic         +----------+--------+-----+--------+--------+--------+ ATA Distal31                   biphasic         +----------+--------+-----+--------+--------+--------+ PTA Distal45                   biphasic         +----------+--------+-----+--------+--------+--------+ PERO Prox 47                   biphasic         +----------+--------+-----+--------+--------+--------+    Left  Stent(s): +---------------+---++--------++ Prox to Stent  116biphasic +---------------+---++--------++ Proximal Stent 97 biphasic +---------------+---++--------++ Mid Stent      86 biphasic +---------------+---++--------++ Distal Stent   45 biphasic +---------------+---++--------++ Distal to Stent80 biphasic +---------------+---++--------++  Summary: Left: Patent left stent with no visualized stenosis. Increased velocity in the proximal common femoral artery with no visualized plaque.     ABI (Date: 04/25/2019): ABI Findings: +---------+------------------+-----+--------+----------+ Right    Rt Pressure (mmHg)IndexWaveformComment    +---------+------------------+-----+--------+----------+ Brachial                                Mastectomy +---------+------------------+-----+--------+----------+ PTA      95                0.82 biphasic           +---------+------------------+-----+--------+----------+ DP       83                0.72 biphasic           +---------+------------------+-----+--------+----------+ Great Toe53                0.46 Abnormal            +---------+------------------+-----+--------+----------+  +---------+------------------+-----+---------+-------+ Left     Lt Pressure (mmHg)IndexWaveform Comment +---------+------------------+-----+---------+-------+ Brachial 116                                     +---------+------------------+-----+---------+-------+ PTA      110               0.95 triphasic        +---------+------------------+-----+---------+-------+ DP       117               1.01 biphasic         +---------+------------------+-----+---------+-------+ Great Toe73                0.63 Abnormal         +---------+------------------+-----+---------+-------+  +-------+-----------+-----------+------------+------------+ ABI/TBIToday's ABIToday's TBIPrevious ABIPrevious TBI +-------+-----------+-----------+------------+------------+ Right  0.82       0.46       1.15        0.64         +-------+-----------+-----------+------------+------------+ Left   1.01       0.63       0.55        0            +-------+-----------+-----------+------------+------------+ Previous ABI was 02-28-19   Summary: Right: Resting right ankle-brachial index indicates mild right lower extremity arterial disease. The right toe-brachial index is abnormal.  Left: Resting left ankle-brachial index is within normal range. No evidence of significant left lower extremity arterial disease. The left toe-brachial index is abnormal.  Abdominal Aortogram (03-15-19): Findings:              Aortogram: No significant renal artery stenosis.  The infrarenal abdominal aorta is small in caliber but patent without significant stenosis.  There is a stent within the right common iliac artery which is widely patent.  The right external iliac artery and left common and external iliac artery remain patent without stenosis.             Right Lower Extremity: The right common femoral profundofemoral artery are widely  patent.  The superficial femoral artery and popliteal artery to the knee are widely patent.  Distal images were  not obtained given contrast volume for intervention             Left Lower Extremity: Left common femoral and profundofemoral artery are widely patent.  There is a stent within the superficial femoral artery with approximately a 90% stenosis at the proximal extent and near occlusion of the distal extent.  The popliteal artery is patent throughout its course.  There is three-vessel runoff to the ankle and the posterior tibial is the dominant vessel across the ankle.    ASSESSMENT: Beth Daniels is a 73 y.o. female who is s/p abdominal aortogram with bilateral lower extremity runoff, drug-eluting stent placed in left superficial femoral artery, mechanical thrombectomy of left anterior tibial artery, mechanical thrombectomy of left posterior tibial artery, and intra-arterial administration of nitroglycerin on 03-15-19 by Dr. Trula Slade for in-stent stenosis of left leg.  The patient has undergone multiple percutaneous interventions at [facility].   She has a hx of mild GI bleed, takes protonix for this, also takes iron daily for anemia and her stools are dark due to this.  She takes duel antiplatelet therapy now. Left LE extremity duplex today shows 304 cm/s at the proximal CFA, but no stenosis in the left SFA stent. I discussed with Dr. Trula Slade whether to continue dual antiplatelet therapy; continue Plavix and 81 mg ASA unless she develops concerns re worsening anemia or GI bleed; if she must stop one, stop the ASA and continue the Plavix. I discussed this with pt and her husband.   ABI improved to normal in the left with bi and triphasic signals, was 0.55 pre-op.  Right ABI declined from normal to 82% with biphasic signals.  Her walking seems limited by her dyspnea. She is receiving physical therapy.   PLAN:  Based on the patient's vascular studies and examination, and after discussing with  Dr. Trula Slade re whether to maintain dual antiplatelet thereapy, pt will return to clinic in 3 months with left LE arterial duplex and ABI's, see Dr. Trula Slade (not NP).  I advised pt and husband to notify us if she develops concerns re the circulation in her feet or legs.  Continue daily seated leg exercises and walking with her walker.  I discussed in depth with the patient the nature of atherosclerosis, and emphasized the importance of maximal medical management including strict control of blood pressure, blood glucose, and lipid levels, obtaining regular exercise, and continued cessation of smoking.  The patient is aware that without maximal medical management the underlying atherosclerotic disease process will progress, limiting the benefit of any interventions.  The patient was given information about PAD including signs, symptoms, treatment, what symptoms should prompt the patient to seek immediate medical care, and risk reduction measures to take.  Clemon Chambers, RN, MSN, FNP-C [facility] [place] [facility] Phone: [phone]  Clinic MD: Trula Slade  04/25/19 1:41 PM

## 2019-04-25 NOTE — Patient Instructions (Signed)
Peripheral Vascular Disease  Peripheral vascular disease (PVD) is a disease of the blood vessels that are not part of your heart and brain. A simple term for PVD is poor circulation. In most cases, PVD narrows the blood vessels that carry blood from your heart to the rest of your body. This can reduce the supply of blood to your arms, legs, and internal organs, like your stomach or kidneys. However, PVD most often affects a person's lower legs and feet. Without treatment, PVD tends to get worse. PVD can also lead to acute ischemic limb. This is when an arm or leg suddenly cannot get enough blood. This is a medical emergency. Follow these instructions at home: Lifestyle  Do not use any products that contain nicotine or tobacco, such as cigarettes and e-cigarettes. If you need help quitting, ask your doctor.  Lose weight if you are overweight. Or, stay at a healthy weight as told by your doctor.  Eat a diet that is low in fat and cholesterol. If you need help, ask your doctor.  Exercise regularly. Ask your doctor for activities that are right for you. General instructions  Take over-the-counter and prescription medicines only as told by your doctor.  Take good care of your feet: ? Wear comfortable shoes that fit well. ? Check your feet often for any cuts or sores.  Keep all follow-up visits as told by your doctor This is important. Contact a doctor if:  You have cramps in your legs when you walk.  You have leg pain when you are at rest.  You have coldness in a leg or foot.  Your skin changes.  You are unable to get or have an erection (erectile dysfunction).  You have cuts or sores on your feet that do not heal. Get help right away if:  Your arm or leg turns cold, numb, and blue.  Your arms or legs become red, warm, swollen, painful, or numb.  You have chest pain.  You have trouble breathing.  You suddenly have weakness in your face, arm, or leg.  You become very  confused or you cannot speak.  You suddenly have a very bad headache.  You suddenly cannot see. Summary  Peripheral vascular disease (PVD) is a disease of the blood vessels.  A simple term for PVD is poor circulation. Without treatment, PVD tends to get worse.  Treatment may include exercise, low fat and low cholesterol diet, and quitting smoking. This information is not intended to replace advice given to you by your health care provider. Make sure you discuss any questions you have with your health care provider. Document Released: 09/17/2009 Document Revised: 06/05/2017 Document Reviewed: 07/31/2016 Elsevier Patient Education  2020 [employer]  

## 2019-04-28 ENCOUNTER — Other Ambulatory Visit: Payer: Self-pay

## 2019-04-28 DIAGNOSIS — I779 Disorder of arteries and arterioles, unspecified: Secondary | ICD-10-CM

## 2019-05-02 ENCOUNTER — Other Ambulatory Visit: Payer: Self-pay | Admitting: *Deleted

## 2019-05-02 DIAGNOSIS — R918 Other nonspecific abnormal finding of lung field: Secondary | ICD-10-CM

## 2019-05-06 ENCOUNTER — Ambulatory Visit
Admission: RE | Admit: 2019-05-06 | Discharge: 2019-05-06 | Disposition: A | Discharge status: Home / Self Care | Payer: Medicare Other | Source: Ambulatory Visit | Attending: Pulmonary Disease | Admitting: Pulmonary Disease

## 2019-05-06 DIAGNOSIS — R918 Other nonspecific abnormal finding of lung field: Secondary | ICD-10-CM

## 2019-05-18 ENCOUNTER — Ambulatory Visit (INDEPENDENT_AMBULATORY_CARE_PROVIDER_SITE_OTHER): Payer: Medicare Other | Admitting: Pulmonary Disease

## 2019-05-18 ENCOUNTER — Other Ambulatory Visit: Payer: Self-pay

## 2019-05-18 ENCOUNTER — Encounter: Payer: Self-pay | Admitting: Pulmonary Disease

## 2019-05-18 VITALS — BP 110/62 | Ht 62.0 in | Wt 93.4 lb

## 2019-05-18 DIAGNOSIS — R918 Other nonspecific abnormal finding of lung field: Secondary | ICD-10-CM

## 2019-05-18 NOTE — Patient Instructions (Signed)
COPD Multiple lung nodules have remained stable  I will see you back in 6  Continue to try to stay active  Continue oxygen supplementation  Call with significant concerns  We will repeat your CT scan in 6 months

## 2019-05-18 NOTE — Progress Notes (Signed)
Ct                Beth Daniels    [id number]    1946/01/31  Primary Care Physician:Russo, [name], MD  Referring Physician: Shon Baton, [place],  [place] [id number]  Chief complaint:   Follow-up for chronic obstructive pulmonary disease, lung nodule  HPI:  Has been stable since her last visit Recently had a fall for which she had to have surgical intervention on her left hip  She has been in rehab  She is getting back to physical activity, does exercise on a bike  Uses oxygen around-the-clock   She was following up at [facility] were sent over which was reviewed  She does have advanced chronic obstructive pulmonary disease, extensive emphysema Multiple lung nodules which have been followed for a couple years The last CT she had in May 2020 did reveal a new 4 mm nodule-the plan for this is to follow-up in 6 months Recently quit smoking He was recently hospitalized with a broken hip-rehabbing from this  She is using oxygen supplementation at about 2 L, mostly at night and more frequently following a recent fall  She does have peripheral arterial disease, significant peripheral neuropathy History of breast cancer-right breast in 2013  Compliant with nebulization use with albuterol and MDI use On Anoro long-term  Breathing has been relatively stable  She is limited with activities of daily living  Smoking history: Reformed smoker  Outpatient Encounter Medications as of 05/18/2019  Medication Sig  . albuterol (PROVENTIL) (2.5 MG/3ML) 0.083% nebulizer solution Take 2.5 mg by nebulization every 4 (four) hours as needed for wheezing or shortness of breath.  [name] albuterol (VENTOLIN HFA) 108 (90 Base) MCG/ACT inhaler Inhale 1 puff into the lungs every 6 (six) hours as needed for wheezing or shortness of breath.   [name] aspirin EC 81 MG tablet Take 81 mg by mouth daily.  [name] azelastine (ASTELIN) 0.1 % nasal spray Place 1 spray into both nostrils 2 (two) times  daily.   . Calcium Carbonate-Vitamin D (CALCIUM 500 + D) 500-125 MG-UNIT TABS Take 1 tablet by mouth daily.   . clopidogrel (PLAVIX) 75 MG tablet Take 75 mg by mouth at bedtime.   . conjugated estrogens (PREMARIN) vaginal cream Place 1 Applicatorful vaginally every other day.   . Cranberry 400 MG CAPS Take 400 mg by mouth daily at 2 PM.   . denosumab (PROLIA) 60 MG/ML SOSY injection Inject 60 mg into the skin every 6 (six) months.   . docusate sodium (COLACE) 100 MG capsule Take 1 capsule (100 mg total) by mouth 2 (two) times daily. (Patient taking differently: Take 100 mg by mouth at bedtime. )  . feeding supplement, ENSURE ENLIVE, (ENSURE ENLIVE) LIQD Take 237 mLs by mouth 3 (three) times daily between meals.  . Ferrous Fumarate (HEMOCYTE - 106 MG FE) 324 (106 Fe) MG TABS tablet Take 1 tablet by mouth daily at 3 pm.   . fluticasone (FLONASE) 50 MCG/ACT nasal spray Place 2 sprays into both nostrils 2 (two) times daily.  . folic acid (FOLVITE) 1 MG tablet Take 1 mg by mouth daily.  [name] gabapentin (NEURONTIN) 300 MG capsule Take 1 capsule (300 mg total) by mouth 3 (three) times daily.  [name] guaiFENesin (MUCINEX) 600 MG 12 hr tablet Take 600 mg by mouth 2 (two) times daily.   . magnesium oxide (MAG-OX) 400 MG tablet Take 400 mg by mouth daily.   . Multiple Vitamin (  MULTI-VITAMIN) tablet Take 1 tablet by mouth daily.   . mupirocin ointment (BACTROBAN) 2 % Apply 1 application topically daily as needed (wound care).   . nicotine polacrilex (COMMIT) 4 MG lozenge Take 4 mg by mouth as needed for smoking cessation.   . OXYGEN 2 liters at hs  . pantoprazole (PROTONIX) 40 MG tablet Take 40 mg by mouth at bedtime.   . sodium chloride (OCEAN) 0.65 % SOLN nasal spray Place 1 spray into both nostrils as needed for congestion.  [name] umeclidinium-vilanterol (ANORO ELLIPTA) 62.5-25 MCG/INH AEPB Inhale 1 puff into the lungs daily.   . Wheat Dextrin (BENEFIBER) POWD Take 1 Dose by mouth daily.  [name] atorvastatin (LIPITOR)  20 MG tablet Take 20 mg by mouth at bedtime.    No facility-administered encounter medications on file as of 05/18/2019.     Allergies as of 05/18/2019  . (No Known Allergies)    Past Medical History:  Diagnosis Date  . Alcohol abuse   . Cancer [facility])    breast right  . Chronic hyponatremia   . COPD (chronic obstructive pulmonary disease) ([place])   . DCIS (ductal carcinoma in situ)   . Emphysema of lung ([place])   . Osteoporosis   . Overactive bladder   . PVD (peripheral vascular disease) ([place])   . Tobacco use     Past Surgical History:  Procedure Laterality Date  . ABDOMINAL AORTOGRAM W/LOWER EXTREMITY N/A 03/15/2019   Procedure: ABDOMINAL AORTOGRAM W/LOWER EXTREMITY;  Surgeon: Serafina Mitchell, MD;  Location: [facility];  Service: Cardiovascular;  Laterality: N/A;  . aeptoplasty    . cartaract extraction    . INTRAMEDULLARY (IM) NAIL INTERTROCHANTERIC Left 01/30/2019   Procedure: INTRAMEDULLARY (IM) NAIL INTERTROCHANTRIC;  Surgeon: Frederik Pear, MD;  Location: [facility];  Service: Orthopedics;  Laterality: Left;  . IR FIBRIN GLUE REPAIR ANAL FISTULA    . MASTECTOMY     s/p bl  . PERIPHERAL VASCULAR INTERVENTION  03/15/2019   Procedure: PERIPHERAL VASCULAR INTERVENTION;  Surgeon: Serafina Mitchell, MD;  Location: [facility];  Service: Cardiovascular;;  Lt. SFA  . PERIPHERAL VASCULAR THROMBECTOMY  03/15/2019   Procedure: PERIPHERAL VASCULAR THROMBECTOMY;  Surgeon: Serafina Mitchell, MD;  Location: [facility];  Service: Cardiovascular;;  Lt. AT, PT  . RETINAL DETACHMENT SURGERY    . sclerotherapy vein leg    . vitreous retinal surgery      Family History  Problem Relation Age of Onset  . Alzheimer's disease Mother   . Pneumonia Father        aspiration pneumonia    Social History   Socioeconomic History  . Marital status: Married    Spouse name: Not on file  . Number of children: Not on file  . Years of education: Not on file  . Highest education level:  Not on file  Occupational History  . Occupation: RETIRED  Social Needs  . Financial resource strain: Not on file  . Food insecurity    Worry: Not on file    Inability: Not on file  . Transportation needs    Medical: Not on file    Non-medical: Not on file  Tobacco Use  . Smoking status: Former Smoker    Packs/day: 1.50    Years: 50.00    Pack years: 75.00    Types: Cigarettes    Start date: 19    Quit date: 10/06/2016    Years since quitting: 2.6  . Smokeless tobacco:  Never Used  Substance and Sexual Activity  . Alcohol use: Yes    Alcohol/week: 28.0 standard drinks    Types: 28 Glasses of wine per week    Comment: 4 glasses wine a night  . Drug use: Not Currently  . Sexual activity: Not on file  Lifestyle  . Physical activity    Days per week: Not on file    Minutes per session: Not on file  . Stress: Not on file  Relationships  . Social Herbalist on phone: Not on file    Gets together: Not on file    Attends religious service: Not on file    Active member of club or organization: Not on file    Attends meetings of clubs or organizations: Not on file    Relationship status: Not on file  . Intimate partner violence    Fear of current or ex partner: Not on file    Emotionally abused: Not on file    Physically abused: Not on file    Forced sexual activity: Not on file  Other Topics Concern  . Not on file  Social History Narrative   Right handed, no children, one story with stairs. Lives with husband    Review of Systems  Constitutional: Positive for fatigue.  HENT: Negative.   Respiratory: Positive for shortness of breath.   Cardiovascular: Positive for leg swelling. Negative for chest pain.  Gastrointestinal: Negative.   Endocrine: Negative.   Genitourinary: Negative.   All other systems reviewed and are negative.   Vitals:   05/18/19 1139  BP: 110/62  SpO2: 99%     Physical Exam  Constitutional: She appears well-developed and  well-nourished.  HENT:  Head: Normocephalic and atraumatic.  Eyes: Pupils are equal, round, and reactive to light. Right eye exhibits no discharge. Left eye exhibits no discharge.  Neck: Normal range of motion. Neck supple. No tracheal deviation present. No thyromegaly present.  Cardiovascular: Normal rate and regular rhythm.  Pulmonary/Chest: Effort normal. No respiratory distress. She has no wheezes. She has no rales.  Decreased air entry bilaterally  Abdominal: Soft. Bowel sounds are normal. She exhibits no distension. There is no abdominal tenderness.   Data Reviewed: Records from [place] reviewed CT scan report reviewed CT scan of the chest reviewed showing multiple lung nodules, extensive emphysema CT is stable compared to one performed 6 months ago  Assessment:  Chronic obstructive pulmonary disease -Bronchodilators -Respiratory status is stable at present  Emphysema -Severe emphysema as evidenced on the CT scan of the chest  Debility -Multifactorial -Recent fall with femur fracture  Multiple lung nodules with a new nodule noted recently, six-month follow-up of nodule is recommended -Nodules have remained stable on repeat CT  Plan/Recommendations:  Continue oxygen supplementation  Continue Anoro Continue albuterol as needed  Maintain physical activity  Call with significant concerns  We will repeat CT in 6 months  Sherrilyn Rist MD [facility] 05/18/2019, 12:10 PM  CC: Shon Baton, MD

## 2019-05-20 ENCOUNTER — Inpatient Hospital Stay: Admission: RE | Admit: 2019-05-20 | Payer: Medicare Other | Source: Ambulatory Visit

## 2019-06-07 MED ORDER — GABAPENTIN 100 MG PO CAPS: 200.0000 mg | ORAL_CAPSULE | Freq: Three times a day (TID) | ORAL | 3 refills | Status: DC

## 2019-07-04 MED ORDER — GABAPENTIN 100 MG PO CAPS: 200.0000 mg | ORAL_CAPSULE | Freq: Three times a day (TID) | ORAL | 0 refills | Status: DC

## 2019-07-04 NOTE — Addendum Note (Signed)
Addended by: [name] on: 07/04/2019 03:33 PM   Modules accepted: Orders

## 2019-07-15 ENCOUNTER — Encounter: Payer: Self-pay | Admitting: Neurology

## 2019-07-18 ENCOUNTER — Other Ambulatory Visit: Payer: Self-pay

## 2019-07-18 ENCOUNTER — Telehealth (INDEPENDENT_AMBULATORY_CARE_PROVIDER_SITE_OTHER): Payer: Medicare Other | Admitting: Neurology

## 2019-07-18 DIAGNOSIS — G621 Alcoholic polyneuropathy: Secondary | ICD-10-CM | POA: Diagnosis not present

## 2019-07-18 DIAGNOSIS — I739 Peripheral vascular disease, unspecified: Secondary | ICD-10-CM | POA: Diagnosis not present

## 2019-07-18 DIAGNOSIS — E519 Thiamine deficiency, unspecified: Secondary | ICD-10-CM | POA: Diagnosis not present

## 2019-07-18 DIAGNOSIS — G63 Polyneuropathy in diseases classified elsewhere: Secondary | ICD-10-CM

## 2019-07-18 NOTE — Progress Notes (Signed)
   Due to the COVID-19 crisis, this telephone visit is done from my office and it was initiated and consent given by this patient and or family.   Telephone Visit The purpose of this virtual visit is to provide medical care while limiting exposure to the novel coronavirus.    Consent was obtained for virtual check in visit and initiated by pt/family:  Yes.   Answered questions that patient had about telehealth interaction:  Yes.   I discussed the limitations, risks, security and privacy concerns of performing an evaluation and management service by telephone. I also discussed with the patient that there may be a patient responsible charge related to this service. The patient expressed understanding and agreed to proceed.  Pt location: Home Physician Location: office Name of referring provider:  Shon Baton, MD I connected with .Beth Daniels at patients initiation/request on 07/18/2019 at  9:30 AM EST by telephone and verified that I am speaking with the correct person using two identifiers.  Pt MRN:  [id number] Pt DOB:  [birth year]   History of Present Illness: This is a 74 year-old female here for follow-up of bilateral leg dysesthesias in the setting of PAD, alcohol use, and thiamine deficiency.  At her last visit in July, labs indicated thiamine deficiency and she was recommended to start vitamin B1 100mg  daily, which she has been compliant with.  She continues to take gabapentin 500mg  three times daily which adequately controls her burning sensation in the legs.  Of note, she has known PAD and underwent revascularization due to in-stent stenosis of left leg in September.  She also suffered a fall in July 2020 and fractured her left femur which required surgery.  Since this time, she has been walking with a walker.  She has completed PT which has helped her walking.  She walks with a walker most of the time and sometimes uses a cane.       She has reduced her wine to 1-2 glasses nightly.       Observations/Objective:  She is awake, speech is clear, no dysarthria.    Assessment and Plan:   Probable small fiber neuropathy due to alcohol use, PAD, and thiamine deficiency manifesting with chronic bilateral lower leg burning dysesthesias, stable.   - Continue gabapentin 500mg  three times daily  - Continue thiamine 100mg  daily.   - Continue using a walker  - Encouraged her to continue to reduce alcohol to one beverage nightly  She had many questions regarding COVID19 precautions which I addressed to the best of my ability.   Follow Up Instructions:   I discussed the assessment and treatment plan with the patient. The patient was provided an opportunity to ask questions and all were answered. The patient agreed with the plan and demonstrated an understanding of the instructions.   The patient was advised to call back or seek an in-person evaluation if the symptoms worsen or if the condition fails to improve as anticipated.  Return to clinic in 9 months  Total Time spent in visit with the patient was:  20 min, of which 100% of the time was spent in counseling and/or coordinating care.   Pt understands and agrees with the plan of care outlined.     [name], DO

## 2019-07-21 MED ORDER — ALBUTEROL SULFATE HFA 108 (90 BASE) MCG/ACT IN AERS: 1.0000 | INHALATION_SPRAY | Freq: Four times a day (QID) | RESPIRATORY_TRACT | 3 refills | Status: DC | PRN

## 2019-07-21 MED ORDER — UMECLIDINIUM-VILANTEROL 62.5-25 MCG/INH IN AEPB: 1.0000 | INHALATION_SPRAY | Freq: Every day | RESPIRATORY_TRACT | 3 refills | Status: DC

## 2019-07-21 MED ORDER — ALBUTEROL SULFATE (2.5 MG/3ML) 0.083% IN NEBU: 2.5000 mg | INHALATION_SOLUTION | RESPIRATORY_TRACT | 3 refills | Status: DC | PRN

## 2019-07-21 MED ORDER — AZELASTINE HCL 0.1 % NA SOLN: 1.0000 | Freq: Two times a day (BID) | NASAL | 3 refills | Status: DC

## 2019-07-22 ENCOUNTER — Telehealth (HOSPITAL_COMMUNITY): Payer: Self-pay

## 2019-07-22 NOTE — Telephone Encounter (Signed)

## 2019-07-25 ENCOUNTER — Encounter: Payer: Self-pay | Admitting: Surgery

## 2019-07-25 ENCOUNTER — Ambulatory Visit (INDEPENDENT_AMBULATORY_CARE_PROVIDER_SITE_OTHER)
Admission: RE | Admit: 2019-07-25 | Discharge: 2019-07-25 | Disposition: A | Discharge status: Home / Self Care | Payer: Medicare Other | Source: Ambulatory Visit | Attending: Surgery | Admitting: Surgery

## 2019-07-25 ENCOUNTER — Other Ambulatory Visit: Payer: Self-pay

## 2019-07-25 ENCOUNTER — Ambulatory Visit (INDEPENDENT_AMBULATORY_CARE_PROVIDER_SITE_OTHER): Payer: Medicare Other | Admitting: Surgery

## 2019-07-25 ENCOUNTER — Ambulatory Visit (HOSPITAL_COMMUNITY)
Admission: RE | Admit: 2019-07-25 | Discharge: 2019-07-25 | Disposition: A | Discharge status: Home / Self Care | Payer: Medicare Other | Source: Ambulatory Visit | Attending: Surgery | Admitting: Surgery

## 2019-07-25 VITALS — BP 131/72 | HR 88 | Temp 98.0°F | Resp 20 | Ht 62.0 in | Wt 93.7 lb

## 2019-07-25 DIAGNOSIS — I779 Disorder of arteries and arterioles, unspecified: Secondary | ICD-10-CM

## 2019-07-25 DIAGNOSIS — I70213 Atherosclerosis of native arteries of extremities with intermittent claudication, bilateral legs: Secondary | ICD-10-CM | POA: Diagnosis not present

## 2019-07-25 NOTE — Progress Notes (Signed)
[facility]  Patient name: Beth Daniels MRN: [id number] DOB: Aug 22, 1945 Sex: female   REASON FOR VISIT:    Follow up  HISOTRY OF PRESENT ILLNESS:   Beth Daniels is a 74 y.o. female, who transferred her vascular care from Dr. Laverta Baltimore at [facility] to me because she moved to this area.  She has a history of peripheral vascular disease having undergone left superficial femoral artery stenting on 10/2016.  She then underwent drug-coated balloon angioplasty for in-stent stenosis on 09/2017 and then on 05/2018, [place] angioplasty of the left superficial femoral artery and balloon angioplasty of the right external iliac artery.  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  Currently, she denies claudication or rest pain.  She does not have any open wounds.  The patient is on dual antiplatelet therapy with aspirin and Plavix.  She takes a statin for hypercholesterolemia.  She is a former smoker.  She has COPD and is on home oxygen.  She has recently fallen and broken her hip which was surgically repaired.  PAST MEDICAL HISTORY:   Past Medical History:  Diagnosis Date  . Alcohol abuse   . Cancer [facility])    breast right  . Chronic hyponatremia   . COPD (chronic obstructive pulmonary disease) ([place])   . DCIS (ductal carcinoma in situ)   . Emphysema of lung ([place])   . Osteoporosis   . Overactive bladder   . PVD (peripheral vascular disease) ([place])   . Tobacco use      FAMILY HISTORY:   Family History  Problem Relation Age of Onset  . Alzheimer's disease Mother   . Pneumonia Father        aspiration pneumonia    SOCIAL HISTORY:   Social History   Tobacco Use  . Smoking status: Former Smoker    Packs/day: 1.50    Years: 50.00    Pack years: 75.00    Types: Cigarettes    Start date: 68    Quit date: 10/06/2016    Years since quitting: 2.8  . Smokeless tobacco:  Never Used  Substance Use Topics  . Alcohol use: Yes    Alcohol/week: 28.0 standard drinks    Types: 28 Glasses of wine per week    Comment: 4 glasses wine a night     ALLERGIES:   No Known Allergies   CURRENT MEDICATIONS:   Current Outpatient Medications  Medication Sig Dispense Refill  . albuterol (PROVENTIL) (2.5 MG/3ML) 0.083% nebulizer solution Take 3 mLs (2.5 mg total) by nebulization every 4 (four) hours as needed for wheezing or shortness of breath. 75 mL 3  . albuterol (VENTOLIN HFA) 108 (90 Base) MCG/ACT inhaler Inhale 1 puff into the lungs every 6 (six) hours as needed for wheezing or shortness of breath. 18 g 3  . aspirin EC 81 MG tablet Take 81 mg by mouth daily.    [name] azelastine (ASTELIN) 0.1 % nasal spray Place 1 spray into both nostrils 2 (two) times daily. 30 mL 3  . Calcium Carbonate-Vitamin D (CALCIUM 500 + D) 500-125 MG-UNIT TABS Take 1 tablet by mouth daily.     . cetirizine (ZYRTEC) 10 MG tablet cetirizine 10 mg tablet    . clopidogrel (PLAVIX) 75 MG tablet Take 75 mg by mouth at bedtime.     . conjugated estrogens (PREMARIN) vaginal cream Place 1 Applicatorful vaginally every other day.     . Cranberry 400  MG CAPS Take 400 mg by mouth daily at 2 PM.     . denosumab (PROLIA) 60 MG/ML SOSY injection Inject 60 mg into the skin every 6 (six) months.     . docusate sodium (COLACE) 100 MG capsule Take 1 capsule (100 mg total) by mouth 2 (two) times daily. (Patient taking differently: Take 100 mg by mouth at bedtime. ) 10 capsule 0  . DULoxetine (CYMBALTA) 30 MG capsule duloxetine 30 mg capsule,delayed release    . feeding supplement, ENSURE ENLIVE, (ENSURE ENLIVE) LIQD Take 237 mLs by mouth 3 (three) times daily between meals. 237 mL 12  . Ferrous Fumarate (HEMOCYTE - 106 MG FE) 324 (106 Fe) MG TABS tablet Take 1 tablet by mouth daily at 3 pm.     . fluticasone (FLONASE) 50 MCG/ACT nasal spray Place 2 sprays into both nostrils 2 (two) times daily.    . folic acid  (FOLVITE) 1 MG tablet Take 1 mg by mouth daily.    [name] gabapentin (NEURONTIN) 100 MG capsule Take 2 capsules (200 mg total) by mouth 3 (three) times daily. 42 capsule 0  . gabapentin (NEURONTIN) 300 MG capsule Take 1 capsule (300 mg total) by mouth 3 (three) times daily. 270 capsule 3  . guaiFENesin (MUCINEX) 600 MG 12 hr tablet Take 600 mg by mouth 2 (two) times daily.     . Multiple Vitamin (MULTI-VITAMIN) tablet Take 1 tablet by mouth daily.     . mupirocin ointment (BACTROBAN) 2 % Apply 1 application topically daily as needed (wound care).     . nicotine polacrilex (COMMIT) 4 MG lozenge Take 4 mg by mouth as needed for smoking cessation.     . OXYGEN 2 liters at hs    . pantoprazole (PROTONIX) 40 MG tablet Take 40 mg by mouth at bedtime.     . sodium chloride (OCEAN) 0.65 % SOLN nasal spray Place 1 spray into both nostrils as needed for congestion.    [name] umeclidinium-vilanterol (ANORO ELLIPTA) 62.5-25 MCG/INH AEPB Inhale 1 puff into the lungs daily. 90 each 3  . Vitamin D, Cholecalciferol, 25 MCG (1000 UT) TABS Take 1,000 Units by mouth daily.    . Wheat Dextrin (BENEFIBER) POWD Take 1 Dose by mouth daily.    [name] atorvastatin (LIPITOR) 20 MG tablet Take 20 mg by mouth at bedtime.      No current facility-administered medications for this visit.    REVIEW OF SYSTEMS:   [X]  denotes positive finding, [ ]  denotes negative finding Cardiac  Comments:  Chest pain or chest pressure:    Shortness of breath upon exertion:    Short of breath when lying flat:    Irregular heart rhythm:        Vascular    Pain in calf, thigh, or hip brought on by ambulation:    Pain in feet at night that wakes you up from your sleep:     Blood clot in your veins:    Leg swelling:         Pulmonary    Oxygen at home: x   Productive cough:     Wheezing:         Neurologic    Sudden weakness in arms or legs:     Sudden numbness in arms or legs:     Sudden onset of difficulty speaking or slurred speech:     Temporary loss of vision in one eye:     Problems with dizziness:  Gastrointestinal    Blood in stool:     Vomited blood:         Genitourinary    Burning when urinating:     Blood in urine:        Psychiatric    Major depression:         Hematologic    Bleeding problems:    Problems with blood clotting too easily:        Skin    Rashes or ulcers:        Constitutional    Fever or chills:      PHYSICAL EXAM:   Vitals:   07/25/19 1155  BP: 131/72  Pulse: 88  Resp: 20  Temp: 98 F (36.7 C)  SpO2: 98%  Weight: 93 lb 11.2 oz (42.5 kg)  Height: 5\' 2"  (1.575 m)    GENERAL: The patient is a well-nourished female, in no acute distress. The vital signs are documented above. CARDIAC: There is a regular rate and rhythm.  VASCULAR: I can not palpate pedal pulse PULMONARY: Non-labored respirations.  MUSCULOSKELETAL: There are no major deformities or cyanosis. NEUROLOGIC: No focal weakness or paresthesias are detected. SKIN: There are no ulcers or rashes noted. PSYCHIATRIC: The patient has a normal affect.  STUDIES:   I have reviewed the following: +-------+-----------+-----------+------------+------------+ ABI/TBIToday's ABIToday's TBIPrevious ABIPrevious TBI +-------+-----------+-----------+------------+------------+ Right  0.98       0.44       0.82        0.46         +-------+-----------+-----------+------------+------------+ Left   1.15       0.56       1.01        0.63         +-------+-----------+-----------+------------+------------+  Left: Left common femoral artery velocity of 356 cm/s. Widely patent superficial femoral artery stent.  MEDICAL ISSUES:   PAD:  Her left leg stents are widely patent.  She does have a stenosis within her left common femoral artery which will need to be monitored.  As long as she is asymptomatic and there are no decreases in her ABIs, I would continue to watch this.  If there are these changes, she  would need a repeat arteriogram.  She is going to follow-up in 6 months with ABIs and duplex of her left leg.  I did decide to stop her Plavix today.  She will continue with her aspirin.  She has significant bruising throughout her extremities and has had several large nosebleeds.    Leia Alf, MD, FACS [facility] [phone] Pager [phone]

## 2019-07-27 ENCOUNTER — Other Ambulatory Visit: Payer: Self-pay | Admitting: *Deleted

## 2019-07-27 DIAGNOSIS — I70213 Atherosclerosis of native arteries of extremities with intermittent claudication, bilateral legs: Secondary | ICD-10-CM

## 2019-07-27 DIAGNOSIS — I739 Peripheral vascular disease, unspecified: Secondary | ICD-10-CM

## 2019-08-04 ENCOUNTER — Ambulatory Visit: Payer: Medicare Other

## 2019-08-12 ENCOUNTER — Ambulatory Visit: Payer: Medicare Other | Attending: Internal Medicine

## 2019-08-12 DIAGNOSIS — Z23 Encounter for immunization: Secondary | ICD-10-CM | POA: Insufficient documentation

## 2019-08-12 NOTE — Progress Notes (Signed)
   Covid-19 Vaccination Clinic  Name:  Beth Daniels    MRN: [id number] DOB: 1946/04/08  08/12/2019  [name] was observed post Covid-19 immunization for 30 minutes based on pre-vaccination screening without incidence. She was provided with Vaccine Information Sheet and instruction to access the V-Safe system.   [name] was instructed to call 911 with any severe reactions post vaccine: [name] Difficulty breathing  . Swelling of your face and throat  . A fast heartbeat  . A bad rash all over your body  . Dizziness and weakness    Immunizations Administered    Name Date Dose VIS Date Route   Pfizer COVID-19 Vaccine 08/12/2019  3:00 PM 0.3 mL 06/17/2019 Intramuscular   Manufacturer: [place]   Lot: [id number]   [place]: [address]-2

## 2019-08-15 ENCOUNTER — Ambulatory Visit: Payer: Medicare Other

## 2019-08-18 NOTE — Telephone Encounter (Signed)
Dr Jenetta Downer- Please advise on pt email- rx on file is currently for 1 spray bid   Dr. Ander Slade,    I have another request for one of my RX.  I received the Azelastine 0.1% spray 30 ml.  written for 1 spray in each nostril 2 times a day, and this a little over a month.  I have been using 2 sprays a day, and my previous RX was written for 90 ML which would give me a little over a three month supply which was shipped all at one time.  I have talked to [facility] today and was advised I needed to ask you to re-write the RX for Azelastine as 90 ML with two sprays twice a day, with 3 refills.  This would last a year and would qualify with [facility] as an automatic refill which would last for 1 yr. without my having to request a refill.   Please advise and thank you for your assistance.   Beth Daniels

## 2019-09-06 ENCOUNTER — Ambulatory Visit: Payer: Medicare Other | Attending: Internal Medicine

## 2019-09-06 DIAGNOSIS — Z23 Encounter for immunization: Secondary | ICD-10-CM | POA: Insufficient documentation

## 2019-09-06 NOTE — Progress Notes (Signed)
   Covid-19 Vaccination Clinic  Name:  Beth Daniels    MRN: [id number] DOB: 1945/07/24  09/06/2019  [name] was observed post Covid-19 immunization for 15 minutes without incident. She was provided with Vaccine Information Sheet and instruction to access the V-Safe system.   [name] was instructed to call 911 with any severe reactions post vaccine: [name] Difficulty breathing  . Swelling of face and throat  . A fast heartbeat  . A bad rash all over body  . Dizziness and weakness   Immunizations Administered    Name Date Dose VIS Date Route   Pfizer COVID-19 Vaccine 09/06/2019  2:37 PM 0.3 mL 06/17/2019 Intramuscular   Manufacturer: [place]   Lot: [id number]   [place]: [address]-2

## 2019-09-08 ENCOUNTER — Other Ambulatory Visit: Payer: Self-pay

## 2019-09-08 MED ORDER — GABAPENTIN 300 MG PO CAPS: 300.0000 mg | ORAL_CAPSULE | Freq: Three times a day (TID) | ORAL | 3 refills | Status: DC

## 2019-09-08 MED ORDER — GABAPENTIN 100 MG PO CAPS: 200.0000 mg | ORAL_CAPSULE | Freq: Three times a day (TID) | ORAL | 3 refills | Status: DC

## 2019-09-21 ENCOUNTER — Other Ambulatory Visit (HOSPITAL_COMMUNITY): Payer: Self-pay | Admitting: *Deleted

## 2019-09-22 ENCOUNTER — Other Ambulatory Visit: Payer: Self-pay

## 2019-09-22 ENCOUNTER — Ambulatory Visit (HOSPITAL_COMMUNITY)
Admission: RE | Admit: 2019-09-22 | Discharge: 2019-09-22 | Disposition: A | Discharge status: Home / Self Care | Payer: Medicare Other | Source: Ambulatory Visit | Attending: Internal Medicine | Admitting: Internal Medicine

## 2019-09-22 DIAGNOSIS — M810 Age-related osteoporosis without current pathological fracture: Secondary | ICD-10-CM | POA: Diagnosis not present

## 2019-09-22 MED ORDER — DENOSUMAB 60 MG/ML ~~LOC~~ SOSY: 60.0000 mg | PREFILLED_SYRINGE | Freq: Once | SUBCUTANEOUS | Status: DC

## 2019-09-22 MED ORDER — DENOSUMAB 60 MG/ML ~~LOC~~ SOSY
PREFILLED_SYRINGE | SUBCUTANEOUS | Status: AC
  Administered 2019-09-22: 60 mg via SUBCUTANEOUS
  Filled 2019-09-22: qty 1

## 2019-10-27 NOTE — Telephone Encounter (Signed)
Spoke with patient she states she has some swelling in her left foot and some bruising left inner thigh and knot there just above her knee. She denies any pain and states her foot is warm and has a pulse. She states she has an appt with her PCP tomorrow and she is going to request an xray to see if this is caused from hardware from her fracture . She states she will call us to schedule an appt and [place] if her primary Dr thinks this is related to her vascular stent.

## 2019-11-03 ENCOUNTER — Telehealth: Payer: Self-pay

## 2019-11-03 NOTE — Telephone Encounter (Signed)
Beth Daniels is a patient of Dr. Stephens Shire.  She contacted our office today at 2:40 pm.  She would like [place] to address her symptoms for the past 1.5 weeks: left foot swelling and knot on the inside of her left leg.  She says the knot is "mushy" with a center mass and is about the size of her thumbnail.  She reports there are no problems with her right leg.  She is concerned that she might have a problem with her stent and wants to know if she should be seen at our office / have an x-ray and be seen afterward.  Note: She saw her PCP on Friday and had a xray.  She reports that the primary care physician told her the screws in the femur do not appear to be out of place.  She said the xray was supposed to be read by a radiologist this week.  I suggested that she call the primary care doctor to follow up on the x-ray result.  I told her that we would call her back no later than tomorrow with advice.

## 2019-11-03 NOTE — Telephone Encounter (Signed)
A palpable knot on the thigh does not sound indicative of a problem with her SFA stent.  If there is no skin breakdown of her foot, numbness or cold foot, doubt acute ischemia.  Per Dr. Varney Daily in January, she should return for follow-up with ABIs and lower extremity duplex In June of this year

## 2019-11-04 ENCOUNTER — Other Ambulatory Visit: Payer: Self-pay

## 2019-11-04 DIAGNOSIS — M7989 Other specified soft tissue disorders: Secondary | ICD-10-CM

## 2019-11-04 NOTE — Telephone Encounter (Signed)
I called Ms. Beth Daniels today at 12:00 and read to her the advice provided by Katharine Look.  Ms. Beth Daniels said that she did not have any skin breakdown and that her foot was not cold - it was warm.  She says that the knot and swelling have not changed since she called yesterday.  She has concerns that the stent might be leaking plaque and causing the knot / that there have been changes in the blood vessel itself that might be causing the knot.  She wants to know if an ultrasound / other test can / should be done to find out.  She declined a call back from one of our PA's.  She requested that Dr. Trula Slade review her concerns on Monday to advise. I am routing the message to Dr. Trula Slade.  The patient said that she would call Dr. Virgina Jock to find out the results of the radiology reading of her xray done last week.  Beth Daniels

## 2019-11-04 NOTE — Telephone Encounter (Signed)
X-ray report and office note from Dr. Virgina Jock received on 10/31/19 and available for Dr. Stephens Shire review.  Spoke with Dr. Trula Slade.  He would like the patient to have a left lower extremity arterial duplex on Monday and a telephone visit as the last patient on his schedule.  Beth Daniels

## 2019-11-04 NOTE — Telephone Encounter (Deleted)
X-Ray results and the

## 2019-11-07 ENCOUNTER — Ambulatory Visit (INDEPENDENT_AMBULATORY_CARE_PROVIDER_SITE_OTHER): Payer: Medicare Other | Admitting: Surgery

## 2019-11-07 ENCOUNTER — Ambulatory Visit (HOSPITAL_COMMUNITY)
Admission: RE | Admit: 2019-11-07 | Discharge: 2019-11-07 | Disposition: A | Discharge status: Home / Self Care | Payer: Medicare Other | Source: Ambulatory Visit | Attending: Surgery | Admitting: Surgery

## 2019-11-07 ENCOUNTER — Encounter: Payer: Self-pay | Admitting: Surgery

## 2019-11-07 ENCOUNTER — Ambulatory Visit
Admission: RE | Admit: 2019-11-07 | Discharge: 2019-11-07 | Disposition: A | Discharge status: Home / Self Care | Payer: Medicare Other | Source: Ambulatory Visit | Attending: Pulmonary Disease | Admitting: Pulmonary Disease

## 2019-11-07 ENCOUNTER — Ambulatory Visit: Payer: Medicare Other

## 2019-11-07 ENCOUNTER — Other Ambulatory Visit: Payer: Self-pay

## 2019-11-07 DIAGNOSIS — R918 Other nonspecific abnormal finding of lung field: Secondary | ICD-10-CM

## 2019-11-07 DIAGNOSIS — M7989 Other specified soft tissue disorders: Secondary | ICD-10-CM | POA: Diagnosis not present

## 2019-11-07 DIAGNOSIS — I70213 Atherosclerosis of native arteries of extremities with intermittent claudication, bilateral legs: Secondary | ICD-10-CM | POA: Diagnosis not present

## 2019-11-07 NOTE — Progress Notes (Signed)
[facility] [place]  Patient name: Beth Daniels MRN: [id number] DOB: May 03, 1946 Sex: female      Virtual Visit via Telephone Note   This visit type was conducted due to national recommendations for restrictions regarding the COVID-19 Pandemic (e.g. social distancing) in an effort to limit this patient's exposure and mitigate transmission in our community.  Due to her co-morbid illnesses, this patient is at least at moderate risk for complications without adequate follow up.  This format is felt to be most appropriate for this patient at this time.  The patient did not have access to video technology/had technical difficulties with video requiring transitioning to audio format only (telephone).  All issues noted in this document were discussed and addressed.  No physical exam could be performed with this format.   Patient Location: Home Provider Location: Office    REASON FOR APPOINTMENT:    Follow up  HISTORY OF PRESENT ILLNESS:   Beth Daniels a 74 y.o.female, who transferred her vascular care from Dr. Laverta Baltimore at [facility] to me because she moved to this area. She has a history of peripheral vascular disease having undergone left superficial femoral artery stenting on 10/2016. She then underwent drug-coated balloon angioplasty for in-stent stenosis on 09/2017 and then on 05/2018, Cutting Balloon angioplasty of the left superficial femoral artery and balloon angioplasty of the right external iliac artery. 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 Currently, she denies claudication or rest pain. She does not have any open wounds.  I placed a drug eluding stent on 03/15/2019 for a greater than 80% stenosis at the proximal edge of her prior stents.  She is concerned about knot or mass on her distal thigh.  She came in earlier today for a ultrasound  The patient is on dual antiplatelet therapy with aspirin  and Plavix. She takes a statin for hypercholesterolemia. She is a former smoker. She has COPD and is on home oxygen. She has recently fallen and broken her hip which was surgically repaired.    PAST MEDICAL HISTORY    Past Medical History:  Diagnosis Date  . Alcohol abuse   . Cancer [facility])    breast right  . Chronic hyponatremia   . COPD (chronic obstructive pulmonary disease) ([place])   . DCIS (ductal carcinoma in situ)   . Emphysema of lung ([place])   . Osteoporosis   . Overactive bladder   . PVD (peripheral vascular disease) ([place])   . Tobacco use      FAMILY HISTORY   Family History  Problem Relation Age of Onset  . Alzheimer's disease Mother   . Pneumonia Father        aspiration pneumonia    SOCIAL HISTORY:   Social History   Socioeconomic History  . Marital status: Married    Spouse name: Not on file  . Number of children: Not on file  . Years of education: Not on file  . Highest education level: Not on file  Occupational History  . Occupation: RETIRED  Tobacco Use  . Smoking status: Former Smoker    Packs/day: 1.50    Years: 50.00    Pack years: 75.00    Types: Cigarettes    Start date: 57    Quit date: 10/06/2016    Years since quitting: 3.0  . Smokeless tobacco: Never Used  Substance and Sexual Activity  .  Alcohol use: Yes    Alcohol/week: 28.0 standard drinks    Types: 28 Glasses of wine per week    Comment: 4 glasses wine a night  . Drug use: Not Currently  . Sexual activity: Not on file  Other Topics Concern  . Not on file  Social History Narrative   Right handed, no children, one story with stairs. Lives with husband   Social Determinants of Health   Financial Resource Strain:   . Difficulty of Paying Living Expenses:   Food Insecurity:   . Worried About Charity fundraiser in the Last Year:   . Arboriculturist in the Last Year:   Transportation Needs:   . Film/video editor (Medical):   [name] Lack of Transportation (Non-Medical):     Physical Activity:   . Days of Exercise per Week:   . Minutes of Exercise per Session:   Stress:   . Feeling of Stress :   Social Connections:   . Frequency of Communication with Friends and Family:   . Frequency of Social Gatherings with Friends and Family:   . Attends Religious Services:   . Active Member of Clubs or Organizations:   . Attends Archivist Meetings:   [name] Marital Status:   Intimate Partner Violence:   . Fear of Current or Ex-Partner:   . Emotionally Abused:   [name] Physically Abused:   . Sexually Abused:     ALLERGIES:    No Known Allergies  CURRENT MEDICATIONS:    Current Outpatient Medications  Medication Sig Dispense Refill  . albuterol (PROVENTIL) (2.5 MG/3ML) 0.083% nebulizer solution Take 3 mLs (2.5 mg total) by nebulization every 4 (four) hours as needed for wheezing or shortness of breath. 75 mL 3  . albuterol (VENTOLIN HFA) 108 (90 Base) MCG/ACT inhaler Inhale 1 puff into the lungs every 6 (six) hours as needed for wheezing or shortness of breath. 18 g 3  . aspirin EC 81 MG tablet Take 81 mg by mouth daily.    [name] azelastine (ASTELIN) 0.1 % nasal spray Place 1 spray into both nostrils 2 (two) times daily. 30 mL 3  . Calcium Carbonate-Vitamin D (CALCIUM 500 + D) 500-125 MG-UNIT TABS Take 1 tablet by mouth daily.     . cetirizine (ZYRTEC) 10 MG tablet cetirizine 10 mg tablet    . conjugated estrogens (PREMARIN) vaginal cream Place 1 Applicatorful vaginally every other day.     . Cranberry 400 MG CAPS Take 400 mg by mouth daily at 2 PM.     . denosumab (PROLIA) 60 MG/ML SOSY injection Inject 60 mg into the skin every 6 (six) months.     . docusate sodium (COLACE) 100 MG capsule Take 1 capsule (100 mg total) by mouth 2 (two) times daily. (Patient taking differently: Take 100 mg by mouth at bedtime. ) 10 capsule 0  . DULoxetine (CYMBALTA) 30 MG capsule duloxetine 30 mg capsule,delayed release    . feeding supplement, ENSURE ENLIVE, (ENSURE ENLIVE) LIQD  Take 237 mLs by mouth 3 (three) times daily between meals. 237 mL 12  . Ferrous Fumarate (HEMOCYTE - 106 MG FE) 324 (106 Fe) MG TABS tablet Take 1 tablet by mouth daily at 3 pm.     . fluticasone (FLONASE) 50 MCG/ACT nasal spray Place 2 sprays into both nostrils 2 (two) times daily.    . folic acid (FOLVITE) 1 MG tablet Take 1 mg by mouth daily.    [name] gabapentin (NEURONTIN) 100  MG capsule Take 2 capsules (200 mg total) by mouth 3 (three) times daily. 42 capsule 3  . gabapentin (NEURONTIN) 300 MG capsule Take 1 capsule (300 mg total) by mouth 3 (three) times daily. 270 capsule 3  . guaiFENesin (MUCINEX) 600 MG 12 hr tablet Take 600 mg by mouth 2 (two) times daily.     . Multiple Vitamin (MULTI-VITAMIN) tablet Take 1 tablet by mouth daily.     . mupirocin ointment (BACTROBAN) 2 % Apply 1 application topically daily as needed (wound care).     . nicotine polacrilex (COMMIT) 4 MG lozenge Take 4 mg by mouth as needed for smoking cessation.     . OXYGEN 2 liters at hs    . pantoprazole (PROTONIX) 40 MG tablet Take 40 mg by mouth at bedtime.     . sodium chloride (OCEAN) 0.65 % SOLN nasal spray Place 1 spray into both nostrils as needed for congestion.    [name] umeclidinium-vilanterol (ANORO ELLIPTA) 62.5-25 MCG/INH AEPB Inhale 1 puff into the lungs daily. 90 each 3  . Vitamin D, Cholecalciferol, 25 MCG (1000 UT) TABS Take 1,000 Units by mouth daily.    [name] atorvastatin (LIPITOR) 20 MG tablet Take 20 mg by mouth at bedtime.      No current facility-administered medications for this visit.    REVIEW OF SYSTEMS:   Please see the history of present illness.     All other systems reviewed and are negative.  PHYSICAL EXAM:     Recent Labs: 01/30/2019: TSH 2.703 02/01/2019: B Natriuretic Peptide 284.8 02/02/2019: ALT 11; Magnesium 1.6; Platelets 197 03/15/2019: BUN 8; Creatinine, Ser 0.40; Hemoglobin 11.2; Potassium 4.0; Sodium 135   Recent Lipid Panel No results found for: CHOL, TRIG, HDL, CHOLHDL,  LDLCALC, LDLDIRECT  Wt Readings from Last 3 Encounters:  07/25/19 93 lb 11.2 oz (42.5 kg)  05/18/19 93 lb 6.4 oz (42.4 kg)  04/25/19 95 lb (43.1 kg)     STUDIES:   I have reviewed the following ultrasound: Left: Normal examination. No evidence of arterial occlusive disease.  Patent stent with no evidence of stenosis in the proximal to distal  superficial femoral artery. Small, brightly echogenic structure seen at  the symptomatic area.   ASSESSMENT and PLAN   I spoke with the patient via telephone regarding her ultrasound.  There is a bright echogenic spot seen in the area where she says there is a mass.  This is clearly remote from her artery and I do not believe this represents a vascular etiology.  I spoke with Dr. Virgina Jock regarding this and he is going to contemplate additional imaging to try to better characterize this area.  She will keep her regular scheduled follow-up appointments with me.    Time:   Today, I have spent 12 minutes with the patient with telehealth technology discussing the above problems.      Leia Alf, MD, FACS [facility] [phone] Pager [phone]

## 2019-11-15 ENCOUNTER — Encounter: Payer: Self-pay | Admitting: Pulmonary Disease

## 2019-11-15 ENCOUNTER — Other Ambulatory Visit: Payer: Self-pay

## 2019-11-15 ENCOUNTER — Ambulatory Visit (INDEPENDENT_AMBULATORY_CARE_PROVIDER_SITE_OTHER): Payer: Medicare Other | Admitting: Pulmonary Disease

## 2019-11-15 VITALS — BP 118/64 | HR 89 | Temp 97.9°F | Ht 62.0 in | Wt 97.0 lb

## 2019-11-15 DIAGNOSIS — J449 Chronic obstructive pulmonary disease, unspecified: Secondary | ICD-10-CM | POA: Diagnosis not present

## 2019-11-15 DIAGNOSIS — R0602 Shortness of breath: Secondary | ICD-10-CM

## 2019-11-15 DIAGNOSIS — R9389 Abnormal findings on diagnostic imaging of other specified body structures: Secondary | ICD-10-CM | POA: Diagnosis not present

## 2019-11-15 NOTE — Patient Instructions (Signed)
Lung nodules -Nodules are stable from 6 months ago -Next CT scan of the chest should be done in a year from now  COPD -Continue current inhalers -Graded exercises as can be tolerated  Oxygen supplementation -Keep an eye on your oxygen, -We want to keep it above 90 at all times -You may take the oxygen off to give yourself a break as long as you are able to keep it above 90  Ensure you are trying to stay active Get a good idea about how much oxygen you need on a bicycle and try and maintain an active exercise regimen  I will see you again in about 6 months  Call with concerns

## 2019-11-15 NOTE — Progress Notes (Signed)
Ct                Beth Daniels    [id number]    December 07, 1945  Primary Care Physician:Russo, [name], MD  Referring Physician: Shon Baton, [address] [place],  [place] [address]  Chief complaint:   Follow-up care for chronic obstructive pulmonary disease, multiple lung nodules  HPI:  She was following up at [facility] were sent over which were reviewed  Has been stable since her last visit Continues on oxygen supplementation, she have to turn her oxygen up to 3 L  CT scan shows multiple nodules which are stable, but new nodule that was 4 mm is also stable, no new nodules   She does have advanced chronic obstructive pulmonary disease, extensive emphysema  Tolerated rehab well for broken hip  She is using oxygen supplementation at about 3 L, mostly at night and more frequently following a recent fall  Has been complaining of some left lower thigh pain-being worked up  She does have peripheral arterial disease, significant peripheral neuropathy History of breast cancer-right breast in 2013  Compliant with nebulization use with albuterol and MDI use On Anoro long-term  Reformed smoker  Limited with activities  Outpatient Encounter Medications as of 11/15/2019  Medication Sig  . albuterol (PROVENTIL) (2.5 MG/3ML) 0.083% nebulizer solution Take 3 mLs (2.5 mg total) by nebulization every 4 (four) hours as needed for wheezing or shortness of breath.  [name] albuterol (VENTOLIN HFA) 108 (90 Base) MCG/ACT inhaler Inhale 1 puff into the lungs every 6 (six) hours as needed for wheezing or shortness of breath.  [name] aspirin EC 81 MG tablet Take 81 mg by mouth daily.  [name] azelastine (ASTELIN) 0.1 % nasal spray Place 1 spray into both nostrils 2 (two) times daily.  . Calcium Carbonate-Vitamin D (CALCIUM 500 + D) 500-125 MG-UNIT TABS Take 1 tablet by mouth daily.   . cetirizine (ZYRTEC) 10 MG tablet cetirizine 10 mg tablet  . conjugated estrogens (PREMARIN) vaginal cream Place 1  Applicatorful vaginally every other day.   . Cranberry 400 MG CAPS Take 400 mg by mouth daily at 2 PM.   . denosumab (PROLIA) 60 MG/ML SOSY injection Inject 60 mg into the skin every 6 (six) months.   . docusate sodium (COLACE) 100 MG capsule Take 1 capsule (100 mg total) by mouth 2 (two) times daily. (Patient taking differently: Take 100 mg by mouth at bedtime. )  . DULoxetine (CYMBALTA) 30 MG capsule duloxetine 30 mg capsule,delayed release  . feeding supplement, ENSURE ENLIVE, (ENSURE ENLIVE) LIQD Take 237 mLs by mouth 3 (three) times daily between meals.  . Ferrous Fumarate (HEMOCYTE - 106 MG FE) 324 (106 Fe) MG TABS tablet Take 1 tablet by mouth daily at 3 pm.   . fluticasone (FLONASE) 50 MCG/ACT nasal spray Place 2 sprays into both nostrils 2 (two) times daily.  . folic acid (FOLVITE) 1 MG tablet Take 1 mg by mouth daily.  [name] gabapentin (NEURONTIN) 100 MG capsule Take 2 capsules (200 mg total) by mouth 3 (three) times daily.  [name] gabapentin (NEURONTIN) 300 MG capsule Take 1 capsule (300 mg total) by mouth 3 (three) times daily.  [name] guaiFENesin (MUCINEX) 600 MG 12 hr tablet Take 600 mg by mouth 2 (two) times daily.   . Multiple Vitamin (MULTI-VITAMIN) tablet Take 1 tablet by mouth daily.   . mupirocin ointment (BACTROBAN) 2 % Apply 1 application topically daily as needed (wound care).   . nicotine polacrilex (  COMMIT) 4 MG lozenge Take 4 mg by mouth as needed for smoking cessation.   . OXYGEN 2 liters at hs  . pantoprazole (PROTONIX) 40 MG tablet Take 40 mg by mouth at bedtime.   . sodium chloride (OCEAN) 0.65 % SOLN nasal spray Place 1 spray into both nostrils as needed for congestion.  [name] umeclidinium-vilanterol (ANORO ELLIPTA) 62.5-25 MCG/INH AEPB Inhale 1 puff into the lungs daily.  . Vitamin D, Cholecalciferol, 25 MCG (1000 UT) TABS Take 1,000 Units by mouth daily.  [name] atorvastatin (LIPITOR) 20 MG tablet Take 20 mg by mouth at bedtime.    No facility-administered encounter medications on  file as of 11/15/2019.    Allergies as of 11/15/2019  . (No Known Allergies)    Past Medical History:  Diagnosis Date  . Alcohol abuse   . Cancer [facility])    breast right  . Chronic hyponatremia   . COPD (chronic obstructive pulmonary disease) ([place])   . DCIS (ductal carcinoma in situ)   . Emphysema of lung ([place])   . Osteoporosis   . Overactive bladder   . PVD (peripheral vascular disease) ([place])   . Tobacco use     Past Surgical History:  Procedure Laterality Date  . ABDOMINAL AORTOGRAM W/LOWER EXTREMITY N/A 03/15/2019   Procedure: ABDOMINAL AORTOGRAM W/LOWER EXTREMITY;  Surgeon: Serafina Mitchell, MD;  Location: [facility];  Service: Cardiovascular;  Laterality: N/A;  . aeptoplasty    . cartaract extraction    . INTRAMEDULLARY (IM) NAIL INTERTROCHANTERIC Left 01/30/2019   Procedure: INTRAMEDULLARY (IM) NAIL INTERTROCHANTRIC;  Surgeon: Frederik Pear, MD;  Location: [facility];  Service: Orthopedics;  Laterality: Left;  . IR FIBRIN GLUE REPAIR ANAL FISTULA    . MASTECTOMY     s/p bl  . PERIPHERAL VASCULAR INTERVENTION  03/15/2019   Procedure: PERIPHERAL VASCULAR INTERVENTION;  Surgeon: Serafina Mitchell, MD;  Location: [facility];  Service: Cardiovascular;;  Lt. SFA  . PERIPHERAL VASCULAR THROMBECTOMY  03/15/2019   Procedure: PERIPHERAL VASCULAR THROMBECTOMY;  Surgeon: Serafina Mitchell, MD;  Location: [facility];  Service: Cardiovascular;;  Lt. AT, PT  . RETINAL DETACHMENT SURGERY    . sclerotherapy vein leg    . vitreous retinal surgery      Family History  Problem Relation Age of Onset  . Alzheimer's disease Mother   . Pneumonia Father        aspiration pneumonia    Social History   Socioeconomic History  . Marital status: Married    Spouse name: Not on file  . Number of children: Not on file  . Years of education: Not on file  . Highest education level: Not on file  Occupational History  . Occupation: RETIRED  Tobacco Use  . Smoking status: Former  Smoker    Packs/day: 1.50    Years: 50.00    Pack years: 75.00    Types: Cigarettes    Start date: 43    Quit date: 10/06/2016    Years since quitting: 3.1  . Smokeless tobacco: Never Used  Substance and Sexual Activity  . Alcohol use: Yes    Alcohol/week: 28.0 standard drinks    Types: 28 Glasses of wine per week    Comment: 4 glasses wine a night  . Drug use: Not Currently  . Sexual activity: Not on file  Other Topics Concern  . Not on file  Social History Narrative   Right handed, no children, one story with stairs. Lives  with husband   Social Determinants of Radio broadcast assistant Strain:   . Difficulty of Paying Living Expenses:   Food Insecurity:   . Worried About Charity fundraiser in the Last Year:   . Arboriculturist in the Last Year:   Transportation Needs:   . Film/video editor (Medical):   [name] Lack of Transportation (Non-Medical):   Physical Activity:   . Days of Exercise per Week:   . Minutes of Exercise per Session:   Stress:   . Feeling of Stress :   Social Connections:   . Frequency of Communication with Friends and Family:   . Frequency of Social Gatherings with Friends and Family:   . Attends Religious Services:   . Active Member of Clubs or Organizations:   . Attends Archivist Meetings:   [name] Marital Status:   Intimate Partner Violence:   . Fear of Current or Ex-Partner:   . Emotionally Abused:   [name] Physically Abused:   . Sexually Abused:     Review of Systems  Constitutional: Positive for fatigue.  HENT: Negative.   Respiratory: Positive for shortness of breath.   Cardiovascular: Negative for chest pain and leg swelling.  Gastrointestinal: Negative.   Endocrine: Negative.   Genitourinary: Negative.   All other systems reviewed and are negative.   Vitals:   11/15/19 1551  BP: 118/64  Pulse: 89  Temp: 97.9 F (36.6 C)  SpO2: 98%     Physical Exam  Constitutional: She appears well-developed and well-nourished.   HENT:  Head: Normocephalic and atraumatic.  Eyes: Pupils are equal, round, and reactive to light. Right eye exhibits no discharge. Left eye exhibits no discharge.  Cardiovascular: Normal rate and regular rhythm.  Pulmonary/Chest: Effort normal and breath sounds normal. No respiratory distress. She has no wheezes. She has no rales.  Abdominal: There is no abdominal tenderness.   Data Reviewed: Records from [place] reviewed CT scan from 11/07/2019 reviewed showing stable lung nodules  Assessment:  Chronic obstructive pulmonary disease -Not exacerbated  Emphysema  Debility -Multifactorial  Recent femur fracture  Multiple lung nodules with a new nodule noted on last CT, 76-month follow-up showed stable nodules overall  Plan/Recommendations: Continue oxygen supplementation-May take breaks off oxygen supplementation if saturations over 90 while at rest   Continue Anoro  Continue albuterol use  Continue to maintain physical activity -Encouraged to get on a bicycle and exercise regularly  Call with any significant concerns  Obtain CT scan of the chest to follow-up on lung nodules in a year from now  Follow-up in the office in 6 months   Sherrilyn Rist MD [facility] 11/15/2019, 4:12 PM  CC: Shon Baton, MD

## 2019-12-12 ENCOUNTER — Other Ambulatory Visit: Payer: Self-pay | Admitting: Internal Medicine

## 2019-12-12 DIAGNOSIS — R2242 Localized swelling, mass and lump, left lower limb: Secondary | ICD-10-CM

## 2019-12-12 DIAGNOSIS — M79605 Pain in left leg: Secondary | ICD-10-CM

## 2019-12-17 ENCOUNTER — Other Ambulatory Visit: Payer: Self-pay | Admitting: Pulmonary Disease

## 2019-12-21 ENCOUNTER — Other Ambulatory Visit: Payer: Self-pay | Admitting: *Deleted

## 2019-12-21 MED ORDER — AZELASTINE HCL 0.1 % NA SOLN: 1.0000 | Freq: Two times a day (BID) | NASAL | 0 refills | Status: DC

## 2019-12-21 MED ORDER — AZELASTINE-FLUTICASONE 137-50 MCG/ACT NA SUSP: 1.0000 | Freq: Every day | NASAL | 2 refills | Status: DC

## 2019-12-21 MED ORDER — AZELASTINE-FLUTICASONE 137-50 MCG/ACT NA SUSP: 1.0000 | Freq: Every day | NASAL | 6 refills | Status: DC

## 2019-12-21 NOTE — Progress Notes (Signed)
Message sent to patient letting her know that Dr. Ander Slade is replacing the azelastine with Dymista, 1 in halation in each nostril daily.

## 2019-12-21 NOTE — Telephone Encounter (Signed)
May replace with Dymista 1 inhalation in each nostril daily

## 2019-12-26 ENCOUNTER — Other Ambulatory Visit: Payer: Medicare Other

## 2020-01-20 NOTE — Telephone Encounter (Signed)
call patient and let pt know that pt can certainly make a f/u with Dr. Posey Pronto.  You can look in chart and see if things are in the chart that pt is asking about

## 2020-01-23 NOTE — Telephone Encounter (Signed)
Yes you may use Dymista 1 spray in each nostril twice a day-if you do not find the cost prohibitive  What ever is cheapest for you and works is probably the best route  So many things go in to what is covered or what is not covered, how much it costs for you at the end of the day    You can use azelastine alone by itself twice a day if it is cheaper and it works

## 2020-01-23 NOTE — Telephone Encounter (Signed)
Dr. Jenetta Downer, please see pt's mychart message and advise.

## 2020-01-26 MED ORDER — AZELASTINE HCL 0.1 % NA SOLN: 1.0000 | Freq: Two times a day (BID) | NASAL | 11 refills | Status: DC

## 2020-01-26 NOTE — Addendum Note (Signed)
Addended by: Robbie Louis on: 01/26/2020 09:37 AM   Modules accepted: Orders

## 2020-01-27 ENCOUNTER — Other Ambulatory Visit: Payer: Self-pay | Admitting: Neurology

## 2020-03-02 ENCOUNTER — Telehealth: Payer: Self-pay | Admitting: Neurology

## 2020-03-02 NOTE — Telephone Encounter (Signed)
Patient called wanting to see whether Dr. Posey Pronto has received her EMG results from yesterday at [facility] like to schedule another appointment once the results have been reviewed.

## 2020-03-05 NOTE — Telephone Encounter (Signed)
Have you seen this results maybe on paper that was sent to scan? I didn't see anything in media?

## 2020-03-05 NOTE — Telephone Encounter (Signed)
Patient called and left a message stating her records from the test should be released this afternoon and coming to the office for review.

## 2020-03-05 NOTE — Telephone Encounter (Signed)
Pt going to send results

## 2020-03-05 NOTE — Telephone Encounter (Signed)
These results are not available in [place] - they need to be faxed and I have not received them.  She can request them to resend. Pt is already scheduled to see me on 10/15 and does not need a separate appointment.

## 2020-03-06 NOTE — Telephone Encounter (Signed)
EMG performed at [facility] 03/01/2020:  This is an abnormal study.  There is electrodiagnostic evidence consistent with a chronic mild sciatic neuropathy with active denervation in distal muscles and likely localizable to the distal branch to the biceps femoris long head.  NCS: All nerve conduction studies in the left leg were normal. EMG: Left tibialis and medial gastrocnemius showed increased insertional activity with moderate spontaneous activity in the MG.  Semitendinosis shows profuse CRDs.  There was slight reinnervation changes with reduced recruitment in the MG, peroneus longus, although the latter had only fair activation.  ST showed mostly polyphasic motor unit potentials with normal recruitment.  Ultrasound: Ultrasound of the left fibular, tibial, and sciatic nerves were normal.

## 2020-03-09 ENCOUNTER — Other Ambulatory Visit: Payer: Self-pay

## 2020-03-09 ENCOUNTER — Encounter: Payer: Self-pay | Admitting: Neurology

## 2020-03-09 ENCOUNTER — Ambulatory Visit (INDEPENDENT_AMBULATORY_CARE_PROVIDER_SITE_OTHER): Payer: Medicare Other | Admitting: Neurology

## 2020-03-09 VITALS — BP 145/69 | HR 89 | Ht 62.0 in | Wt 97.0 lb

## 2020-03-09 DIAGNOSIS — M79651 Pain in right thigh: Secondary | ICD-10-CM | POA: Diagnosis not present

## 2020-03-09 DIAGNOSIS — I779 Disorder of arteries and arterioles, unspecified: Secondary | ICD-10-CM

## 2020-03-09 DIAGNOSIS — S7402XA Injury of sciatic nerve at hip and thigh level, left leg, initial encounter: Secondary | ICD-10-CM | POA: Diagnosis not present

## 2020-03-09 NOTE — Progress Notes (Signed)
Follow-up Visit   Date: 03/09/20   Beth Daniels MRN: [id number] DOB: 1945-08-12   Interim History: Beth Daniels is a 74 y.o. right-handed Caucasian female with right breast cancer (2013) s/p bilateral mastectomy, COPD, peripheral vascular disease, and tobacco abuse returning to the clinic with new complaints of left leg pain.  She was previously seen for bilateral feet paresthesias secondary to alcohol-induced neuropathy.  The patient was accompanied to the clinic by husband who also provides collateral information.    A few days after her last visit with me in July 2020, she suffered a traumatic fall resulting in left hip intertrochanteric fracture requiring ORIF.  In April she began noticing a know in the anterior distal thigh, which was ultimately found to be hardware from her fixation.  Also around the same time, she started having achy pain in the back of her left thigh, especially when she rests the thigh on a surface, such as when sitting.  She has to sit with her leg elevated.  There is no pain with standing or walking. She does not have numbness/tingling/electrical sensation in the thigh or lower legs.  She had NCS/EMG of the left leg performed at [facility] which showed mild chronic left sciatic neuropathy, US of the sciatic nerve was normal.      Medications:  Current Outpatient Medications on File Prior to Visit  Medication Sig Dispense Refill  . albuterol (PROVENTIL) (2.5 MG/3ML) 0.083% nebulizer solution Take 3 mLs (2.5 mg total) by nebulization every 4 (four) hours as needed for wheezing or shortness of breath. 75 mL 3  . albuterol (VENTOLIN HFA) 108 (90 Base) MCG/ACT inhaler Inhale 1 puff into the lungs every 6 (six) hours as needed for wheezing or shortness of breath. 18 g 3  . aspirin EC 81 MG tablet Take 81 mg by mouth daily.    [name] atorvastatin (LIPITOR) 20 MG tablet Take 20 mg by mouth at bedtime.     [name] azelastine (ASTELIN) 0.1 % nasal spray Place 1 spray into both nostrils 2  (two) times daily. Use in each nostril as directed 30 mL 11  . Azelastine-Fluticasone 137-50 MCG/ACT SUSP Place 1 spray into the nose daily. 23 g 6  . cetirizine (ZYRTEC) 10 MG tablet cetirizine 10 mg tablet    . conjugated estrogens (PREMARIN) vaginal cream Place 1 Applicatorful vaginally. Three times a week M/W/F    . Cranberry 400 MG CAPS Take 400 mg by mouth daily at 2 PM.     . denosumab (PROLIA) 60 MG/ML SOSY injection Inject 60 mg into the skin every 6 (six) months.     . docusate sodium (COLACE) 100 MG capsule Take 1 capsule (100 mg total) by mouth 2 (two) times daily. (Patient taking differently: Take 100 mg by mouth at bedtime. ) 10 capsule 0  . Ferrous Fumarate (HEMOCYTE - 106 MG FE) 324 (106 Fe) MG TABS tablet Take 1 tablet by mouth daily at 3 pm.     . fluticasone (FLONASE) 50 MCG/ACT nasal spray Place 2 sprays into both nostrils 2 (two) times daily.    . folic acid (FOLVITE) 1 MG tablet Take 1 mg by mouth daily.    [name] gabapentin (NEURONTIN) 100 MG capsule TAKE 2 CAPSULES THREE TIMES A DAY (Patient taking differently: Occasionally takes an extra tablet when leg is hurting) 540 capsule 1  . guaiFENesin (MUCINEX) 600 MG 12 hr tablet Take 600 mg by mouth 2 (two) times daily.     [name]  Multiple Vitamin (MULTI-VITAMIN) tablet Take 1 tablet by mouth daily.     . mupirocin ointment (BACTROBAN) 2 % Apply 1 application topically daily as needed (wound care).     . nicotine polacrilex (COMMIT) 4 MG lozenge Take 4 mg by mouth as needed for smoking cessation.     . OXYGEN 2 liters at hs    . pantoprazole (PROTONIX) 40 MG tablet Take 40 mg by mouth at bedtime.     . sodium chloride (OCEAN) 0.65 % SOLN nasal spray Place 1 spray into both nostrils as needed for congestion.    [name] umeclidinium-vilanterol (ANORO ELLIPTA) 62.5-25 MCG/INH AEPB Inhale 1 puff into the lungs daily. 90 each 3  . Vitamin D, Cholecalciferol, 25 MCG (1000 UT) TABS Take 1,000 Units by mouth daily.    [name] gabapentin (NEURONTIN) 300 MG  capsule Take 1 capsule (300 mg total) by mouth 3 (three) times daily. 270 capsule 3   No current facility-administered medications on file prior to visit.    Allergies: No Known Allergies  Vital Signs:  BP (!) 145/69   Pulse 89   Ht 5\' 2"  (1.575 m)   Wt 97 lb (44 kg)   SpO2 95%   BMI 17.74 kg/m    Neurological Exam: MENTAL STATUS including orientation to time, place, person, recent and remote memory, attention span and concentration, language, and fund of knowledge is normal.  Speech is not dysarthric.  CRANIAL NERVES: Pupils equal round and reactive.  Normal conjugate, extra-ocular eye movements in all directions of gaze.    MOTOR:  Motor strength is 5/5 in all extremities, including left leg and distally in the feet.  No atrophy, fasciculations or abnormal movements.  No pronator drift.  Tone is normal.    MSRs:  Reflexes are 2+/4 throughout  SENSORY:  Intact to vibration throughout.  COORDINATION/GAIT:  Normal finger-to- nose-finger.  Intact rapid alternating movements bilaterally.  Gait narrow based and stable, assisted with rollator   Data: EMG performed at [facility] 03/01/2020:  This is an abnormal study.  There is electrodiagnostic evidence consistent with a chronic mild sciatic neuropathy with active denervation in distal muscles and likely localizable to the distal branch to the biceps femoris long head.  NCS: All nerve conduction studies in the left leg were normal. EMG: Left tibialis and medial gastrocnemius showed increased insertional activity with moderate spontaneous activity in the MG.  Semitendinosis shows profuse CRDs.  There was slight reinnervation changes with reduced recruitment in the MG, peroneus longus, although the latter had only fair activation.  ST showed mostly polyphasic motor unit potentials with normal recruitment.  Ultrasound: Ultrasound of the left fibular, tibial, and sciatic nerves were normal.   CT left femur 12/13/2019: 1. Healed  intertrochanteric fracture of the left proximal femur, with posttraumatic deformity. Left femoral intramedullary rod and nail fixation. There are 2 distal interlocking screws, which likely account for the findings reported on ultrasound.  2. Likely chronic cortical thickening of the distal femoral diaphysis.  3. No soft tissue mass or fluid collection.  4. Left hip osteoarthritis.   IMPRESSION/PLAN: Right thigh pain, described as achy/throbbing.  Absence of neuropathic pain makes nerve-related pathology unlikely.  I have personally reviewed her EMG which shows a chronic and mild sciatic neuropathy on the left. Ultrasound of the sciatic nerve is normal. Given that she does not seem to have any neuropathic symptoms currently, I explained that her sciatic neuropathy is most likely old injury from her fall in 2020.  I  also discussed the anatomy of the nerve as it related to her her hip fracture.  Sciatic neuropathy is not due to surgery, hardware, or a new process. Further, she is asymptomatic from this.  Her posterior thigh pain is characterized as achy/throbbing, which is more suggestive of MSK pain.  No other neurological testing is indicated. She will follow-up with her PCP and orthopeadics.  Total time spent reviewing records, interview, history/exam, documentation, counseling, and coordination of care on day of encounter:  40 min    Thank you for allowing me to participate in patient's care.  If I can answer any additional questions, I would be pleased to do so.    Sincerely,    Evangela Heffler K. Posey Pronto, DO

## 2020-04-04 ENCOUNTER — Other Ambulatory Visit (HOSPITAL_COMMUNITY): Payer: Self-pay | Admitting: *Deleted

## 2020-04-05 ENCOUNTER — Other Ambulatory Visit: Payer: Self-pay

## 2020-04-05 ENCOUNTER — Ambulatory Visit (HOSPITAL_COMMUNITY)
Admission: RE | Admit: 2020-04-05 | Discharge: 2020-04-05 | Disposition: A | Discharge status: Home / Self Care | Payer: Medicare Other | Source: Ambulatory Visit | Attending: Internal Medicine | Admitting: Internal Medicine

## 2020-04-05 DIAGNOSIS — M810 Age-related osteoporosis without current pathological fracture: Secondary | ICD-10-CM | POA: Diagnosis not present

## 2020-04-05 MED ORDER — DENOSUMAB 60 MG/ML ~~LOC~~ SOSY
60.0000 mg | PREFILLED_SYRINGE | Freq: Once | SUBCUTANEOUS | Status: AC
  Administered 2020-04-05: 60 mg via SUBCUTANEOUS

## 2020-04-05 MED ORDER — DENOSUMAB 60 MG/ML ~~LOC~~ SOSY
PREFILLED_SYRINGE | SUBCUTANEOUS | Status: AC
  Filled 2020-04-05: qty 1

## 2020-04-16 ENCOUNTER — Telehealth: Payer: Self-pay | Admitting: Neurology

## 2020-04-16 ENCOUNTER — Ambulatory Visit (INDEPENDENT_AMBULATORY_CARE_PROVIDER_SITE_OTHER): Payer: Medicare Other | Admitting: Neurology

## 2020-04-16 ENCOUNTER — Encounter: Payer: Self-pay | Admitting: Neurology

## 2020-04-16 DIAGNOSIS — G5702 Lesion of sciatic nerve, left lower limb: Secondary | ICD-10-CM

## 2020-04-16 DIAGNOSIS — I779 Disorder of arteries and arterioles, unspecified: Secondary | ICD-10-CM | POA: Diagnosis not present

## 2020-04-16 NOTE — Progress Notes (Signed)
Reason for visit: History of peripheral neuropathy, new onset sciatica, left  Referring physician: Dr. Marcille Blanco is a 74 y.o. female  History of present illness:  Beth Daniels is a 74 year old right-handed white female with a history of chronic issues with numbness and tingling in the legs and feet.  The patient has been followed for this since January 2015.  The patient apparently had EMG and nerve conduction study evaluation at that time that was reportedly normal.  Blood work was done to look for source of the neuropathy and was never found.  The possibility of a small fiber neuropathy was entertained.  The patient has undergone some physical therapy without benefit and has had some chronic issues with gait instability.  In July 2020, patient fell and fractured her left femur.  This required surgical management, she has required a walker since that time.  Beginning in April 2021, she began having some discomfort down the left leg with sitting.  If she has pressure on the left buttock or upper thigh posteriorly, she will have symptoms down the left leg.  If she gets the pressure off the leg or stands up, the symptoms go away rapidly.  She believes there is some weakness of the left leg.  She has recently undergone EMG and nerve conduction study evaluation through [facility], this was done on 01 March 2020.  This showed some acute denervation of the gastrocnemius muscle on the left and chronic changes in the hamstring muscle.  The lumbosacral paraspinal muscles were never tested.  Nerve conductions on the left leg otherwise were unremarkable.  The sensory latencies were normal.  The patient does report some chronic problems with urinary urgency and incontinence.  She denies any neck pain or low back pain, she denies any pain down the arms.  She has the sensation of numbness up to the knees bilaterally.  She comes here for further management.   Past Medical History:  Diagnosis Date  . Alcohol  abuse   . Cancer [facility])    breast right  . Chronic hyponatremia   . COPD (chronic obstructive pulmonary disease) ([place])   . DCIS (ductal carcinoma in situ)   . Emphysema of lung ([place])   . Osteoporosis   . Overactive bladder   . PVD (peripheral vascular disease) ([place])   . Tobacco use     Past Surgical History:  Procedure Laterality Date  . ABDOMINAL AORTOGRAM W/LOWER EXTREMITY N/A 03/15/2019   Procedure: ABDOMINAL AORTOGRAM W/LOWER EXTREMITY;  Surgeon: Serafina Mitchell, MD;  Location: [place] [facility];  Service: Cardiovascular;  Laterality: N/A;  . aeptoplasty    . cartaract extraction    . INTRAMEDULLARY (IM) NAIL INTERTROCHANTERIC Left 01/30/2019   Procedure: INTRAMEDULLARY (IM) NAIL INTERTROCHANTRIC;  Surgeon: Frederik Pear, MD;  Location: [place];  Service: Orthopedics;  Laterality: Left;  . IR FIBRIN GLUE REPAIR ANAL FISTULA    . MASTECTOMY     s/p bl  . PERIPHERAL VASCULAR INTERVENTION  03/15/2019   Procedure: PERIPHERAL VASCULAR INTERVENTION;  Surgeon: Serafina Mitchell, MD;  Location: [place] [facility];  Service: Cardiovascular;;  Lt. SFA  . PERIPHERAL VASCULAR THROMBECTOMY  03/15/2019   Procedure: PERIPHERAL VASCULAR THROMBECTOMY;  Surgeon: Serafina Mitchell, MD;  Location: [place] [facility];  Service: Cardiovascular;;  Lt. AT, PT  . RETINAL DETACHMENT SURGERY    . sclerotherapy vein leg    . vitreous retinal surgery      Family History  Problem Relation Age of Onset  . Alzheimer's disease Mother   . Pneumonia Father        aspiration pneumonia    Social history:  reports that she quit smoking about 3 years ago. Her smoking use included cigarettes. She started smoking about 51 years ago. She has a 75.00 pack-year smoking history. She has never used smokeless tobacco. She reports current alcohol use of about 28.0 standard drinks of alcohol per week. She reports previous drug use.  Medications:  Prior to Admission medications   Medication Sig Start Date End Date Taking?  Authorizing Provider  albuterol (PROVENTIL) (2.5 MG/3ML) 0.083% nebulizer solution Take 3 mLs (2.5 mg total) by nebulization every 4 (four) hours as needed for wheezing or shortness of breath. 07/21/19  Yes Olalere, Adewale A, MD  albuterol (VENTOLIN HFA) 108 (90 Base) MCG/ACT inhaler Inhale 1 puff into the lungs every 6 (six) hours as needed for wheezing or shortness of breath. 07/21/19  Yes Olalere, Adewale A, MD  aspirin EC 81 MG tablet Take 81 mg by mouth daily.   Yes [provider]  azelastine (ASTELIN) 0.1 % nasal spray Place 1 spray into both nostrils 2 (two) times daily. Use in each nostril as directed 01/26/20  Yes Olalere, Adewale A, MD  Azelastine-Fluticasone 137-50 MCG/ACT SUSP Place 1 spray into the nose daily. 12/21/19  Yes Olalere, Adewale A, MD  cetirizine (ZYRTEC) 10 MG tablet cetirizine 10 mg tablet   Yes [provider]  conjugated estrogens (PREMARIN) vaginal cream Place 1 Applicatorful vaginally. Three times a week M/W/F 12/20/18  Yes [provider]  Cranberry 400 MG CAPS Take 400 mg by mouth daily at 2 PM.    Yes [provider]  denosumab (PROLIA) 60 MG/ML SOSY injection Inject 60 mg into the skin every 6 (six) months.    Yes [provider]  docusate sodium (COLACE) 100 MG capsule Take 1 capsule (100 mg total) by mouth 2 (two) times daily. Patient taking differently: Take 100 mg by mouth at bedtime.  02/01/19  Yes Manuella Ghazi, Pratik D, DO  Ferrous Fumarate (HEMOCYTE - 106 MG FE) 324 (106 Fe) MG TABS tablet Take 1 tablet by mouth daily at 3 pm.  02/09/18  Yes [provider]  fluticasone (FLONASE) 50 MCG/ACT nasal spray Place 2 sprays into both nostrils 2 (two) times daily.   Yes [provider]  folic acid (FOLVITE) 1 MG tablet Take 1 mg by mouth daily.   Yes [provider]  gabapentin (NEURONTIN) 100 MG capsule TAKE 2 CAPSULES THREE TIMES A DAY Patient taking differently: Occasionally takes an extra tablet when leg  is hurting 01/30/20  Yes Patel, Donika K, DO  gabapentin (NEURONTIN) 300 MG capsule Take 1 capsule (300 mg total) by mouth 3 (three) times daily. 09/08/19  Yes Patel, Donika K, DO  guaiFENesin (MUCINEX) 600 MG 12 hr tablet Take 600 mg by mouth 2 (two) times daily.    Yes [provider]  Multiple Vitamin (MULTI-VITAMIN) tablet Take 1 tablet by mouth daily.    Yes [provider]  mupirocin ointment (BACTROBAN) 2 % Apply 1 application topically daily as needed (wound care).  08/28/16  Yes [provider]  nicotine polacrilex (COMMIT) 4 MG lozenge Take 4 mg by mouth as needed for smoking cessation.  09/10/16  Yes [provider]  OXYGEN 2 liters at hs   Yes [provider]  pantoprazole (PROTONIX) 40 MG tablet Take 40 mg by mouth at bedtime.  12/01/18  Yes [provider]  sodium chloride (OCEAN) 0.65 % SOLN nasal spray Place 1 spray into both nostrils as needed for congestion.   Yes [provider]  umeclidinium-vilanterol (ANORO ELLIPTA) 62.5-25 MCG/INH AEPB Inhale 1 puff into the lungs daily. 07/21/19  Yes Olalere, Adewale A, MD  Vitamin D, Cholecalciferol, 25 MCG (1000 UT) TABS Take 1,000 Units by mouth daily.   Yes [provider]  atorvastatin (LIPITOR) 20 MG tablet Take 20 mg by mouth at bedtime.  02/15/18 03/09/20  [provider]     No Known Allergies  ROS:  Out of a complete 14 system review of symptoms, the patient complains only of the following symptoms, and all other reviewed systems are negative.  Leg discomfort Shortness of breath Gait instability  Blood pressure 137/81, pulse (!) 104, height 5\' 2"  (1.575 m), weight 96 lb 8 oz (43.8 kg).  Physical Exam  General: The patient is alert and cooperative at the time of the examination.  Eyes: Pupils are equal, round, and reactive to light. Discs are flat bilaterally.  Neck: The neck is supple, no carotid bruits are noted.  Respiratory: The respiratory  examination is clear.  Cardiovascular: The cardiovascular examination reveals a regular rate and rhythm, no obvious murmurs or rubs are noted.  Skin: Extremities are without significant edema.  Neurologic Exam  Mental status: The patient is alert and oriented x 3 at the time of the examination. The patient has apparent normal recent and remote memory, with an apparently normal attention span and concentration ability.  Cranial nerves: Facial symmetry is present. There is good sensation of the face to pinprick and soft touch bilaterally. The strength of the facial muscles and the muscles to head turning and shoulder shrug are normal bilaterally. Speech is well enunciated, no aphasia or dysarthria is noted. Extraocular movements are full. Visual fields are full. The tongue is midline, and the patient has symmetric elevation of the soft palate. No obvious hearing deficits are noted.  Motor: The motor testing reveals 5 over 5 strength of all 4 extremities. Good symmetric motor tone is noted throughout.  Sensory: Sensory testing is intact to pinprick, soft touch, vibration sensation, and position sense on all 4 extremities, with exception of some pinprick deficit up to the knees bilaterally. No evidence of extinction is noted.  Coordination: Cerebellar testing reveals good finger-nose-finger and heel-to-shin bilaterally.  Gait and station: Gait is minimally wide-based.  Patient normally walks with a walker.  Patient is able to walk on the heels and the toes bilaterally.  Tandem gait is slightly unsteady.  Reflexes: Deep tendon reflexes are symmetric and normal bilaterally, with exception of decreased ankle jerk reflexes bilaterally. Toes are downgoing bilaterally.   Assessment/Plan:  1.  History of peripheral neuropathy  2.  Left sided sciatic compression syndrome  The patient is having sciatica type symptoms with pressure on the upper thigh or buttocks on the left with sitting.  This likely  does represent a sciatic compression syndrome.  The patient has a history of a peripheral neuropathy with normal nerve conductions, she may have a small fiber neuropathy or the possibility a lumbar spinal stenosis does need to be considered.  The patient be set up for MRI of the lumbar spine and of the pelvis looking for evidence of spinal stenosis or a compression issue involving the sciatic nerve.  The patient will follow up here in 4 or 5 months.  Beth Alexanders MD 04/16/2020 9:58 AM  [facility] [address] [place], [place] [address]  Phone [phone] Fax [phone]

## 2020-04-16 NOTE — Telephone Encounter (Signed)
Medicare/tricare order sent to GI. No auth they will reach out to the patient to schedule.  

## 2020-04-19 ENCOUNTER — Other Ambulatory Visit: Payer: Self-pay | Admitting: Pulmonary Disease

## 2020-04-19 DIAGNOSIS — R918 Other nonspecific abnormal finding of lung field: Secondary | ICD-10-CM

## 2020-04-19 DIAGNOSIS — J441 Chronic obstructive pulmonary disease with (acute) exacerbation: Secondary | ICD-10-CM

## 2020-04-20 ENCOUNTER — Ambulatory Visit: Payer: Medicare Other | Admitting: Neurology

## 2020-04-25 ENCOUNTER — Other Ambulatory Visit: Payer: Self-pay

## 2020-04-25 ENCOUNTER — Ambulatory Visit
Admission: RE | Admit: 2020-04-25 | Discharge: 2020-04-25 | Disposition: A | Discharge status: Home / Self Care | Payer: Medicare Other | Source: Ambulatory Visit | Attending: Neurology | Admitting: Neurology

## 2020-04-25 DIAGNOSIS — G5702 Lesion of sciatic nerve, left lower limb: Secondary | ICD-10-CM | POA: Diagnosis not present

## 2020-04-27 ENCOUNTER — Telehealth: Payer: Self-pay | Admitting: Neurology

## 2020-04-27 NOTE — Telephone Encounter (Signed)
  I called the patient.  MRI of the lumbar spine show some arthritic changes, no evidence of nerve root compression.  MRI of the pelvis has been ordered, not yet read out.  If the patient has not heard from me in several days, she is to call back so we can determine what the result is.  The patient reports pain in the buttock with sitting, not in the back itself.  She likely has a sciatic compression syndrome.   MRI lumbar 04/26/20:  IMPRESSION: Abnormal MRI scan of the lumbar spine without contrast showing minor disc changes throughout with facet hypertrophy resulting in only minor posterior canal narrowing at L2-3 to L4-5 but without significant compression.

## 2020-05-01 IMAGING — US VENOUS DOPPLER ULTRASOUND OF LEFT LOWER EXTREMITY
1 series · 13 of 24 positions shown · non-contrast
Comparison: None.

CLINICAL DATA: 73-year-old female with a history of left hip pain,
status post arthroplasty



[Series 1: venous doppler ultrasound of left lower extremity · 0.07mm/px · 13 of 43 slices shown]
[im 1/43]
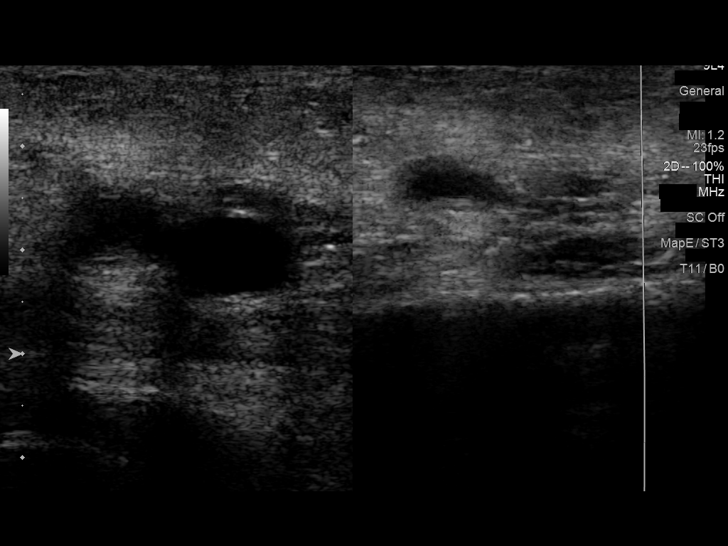
[im 4/43]
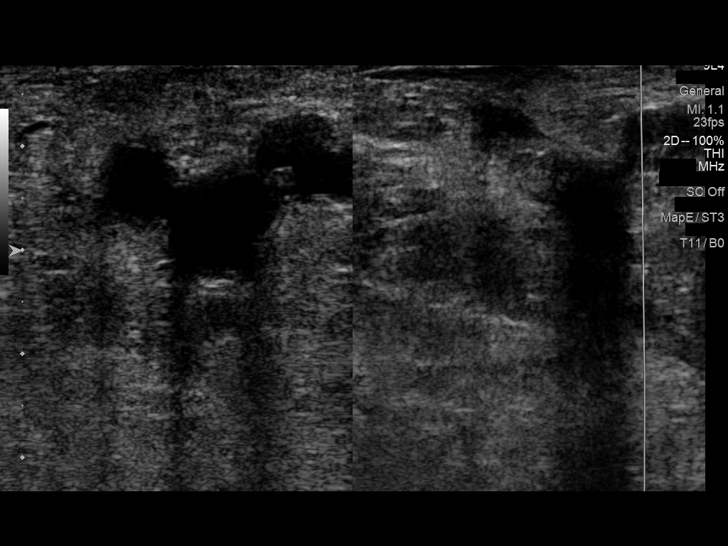
[im 8/43]
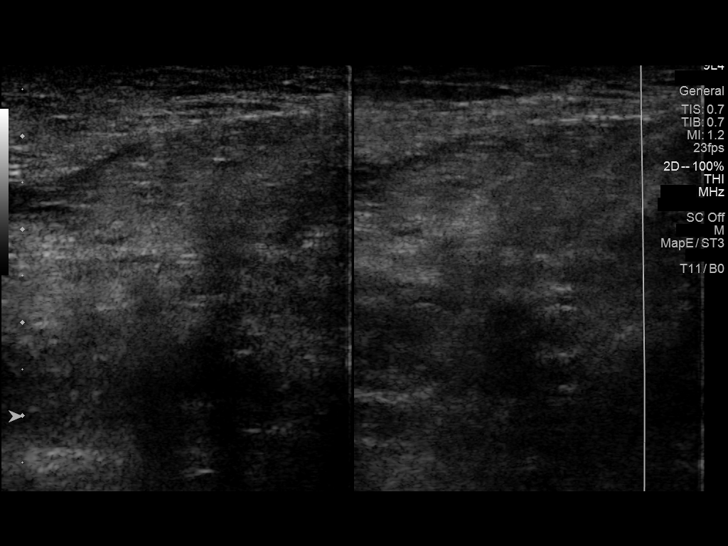
[im 11/43]
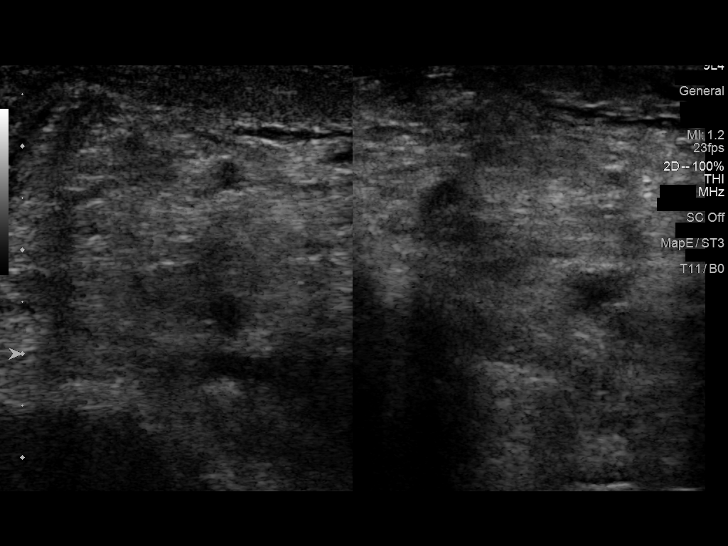
[im 15/43]
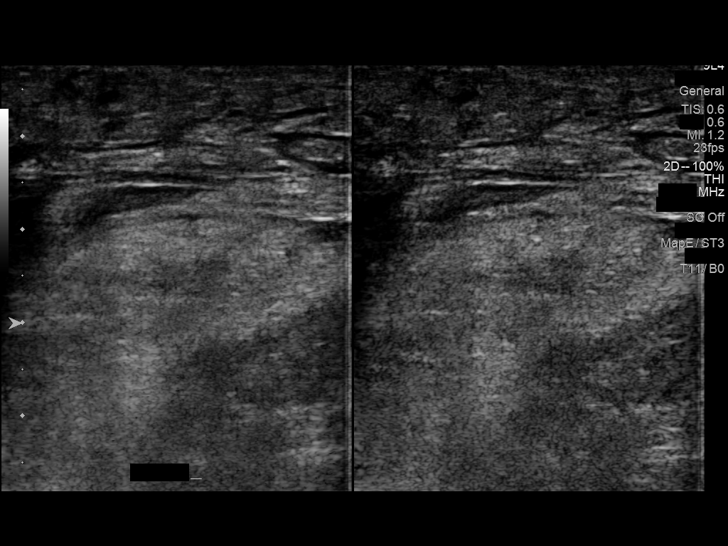
[im 19/43]
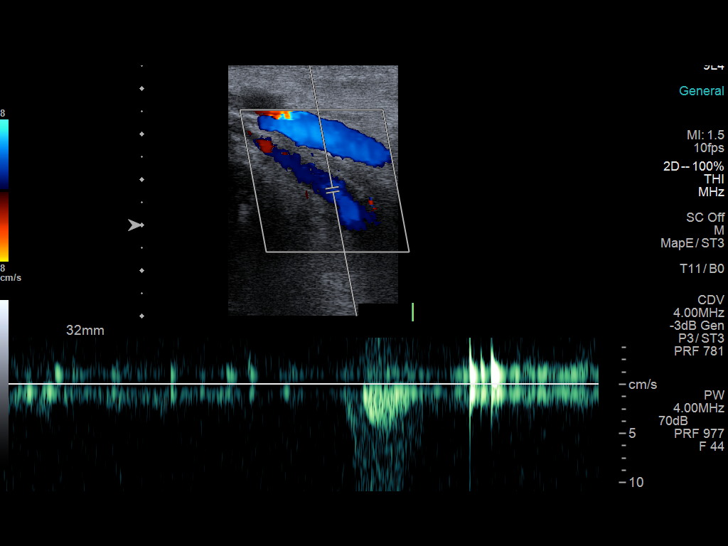
[im 22/43]
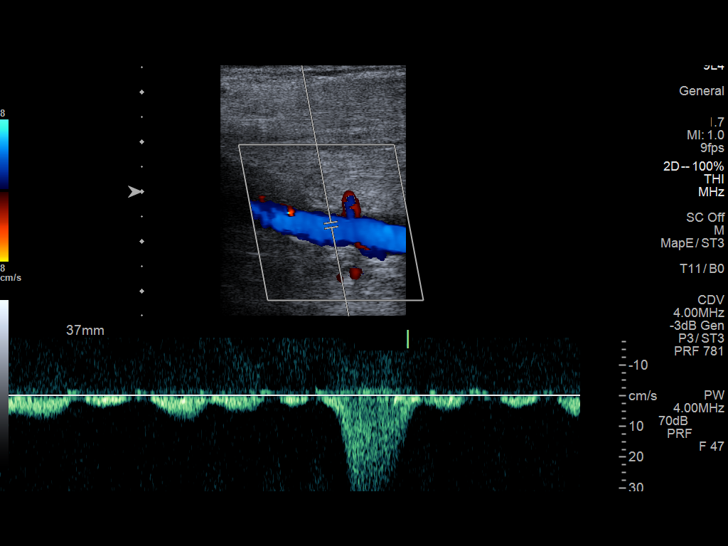
[im 24/43]
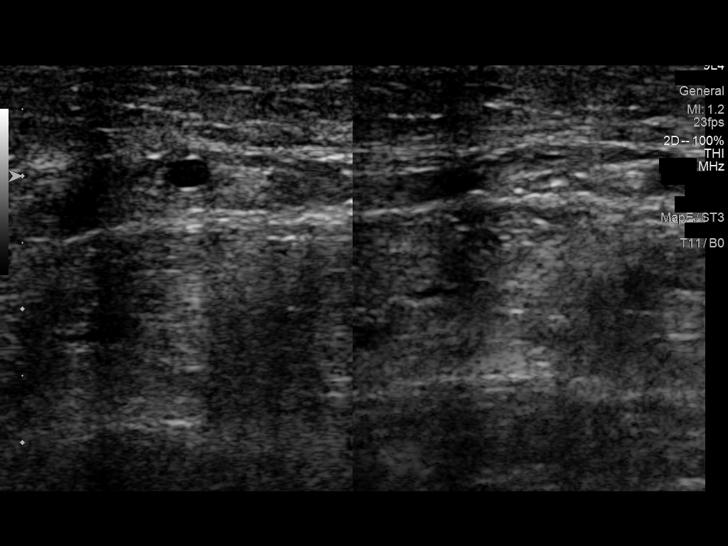
[im 28/43]
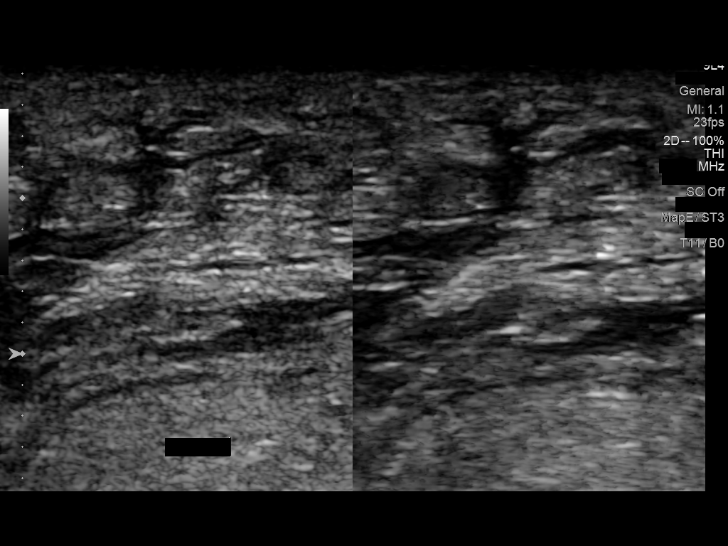
[im 32/43]
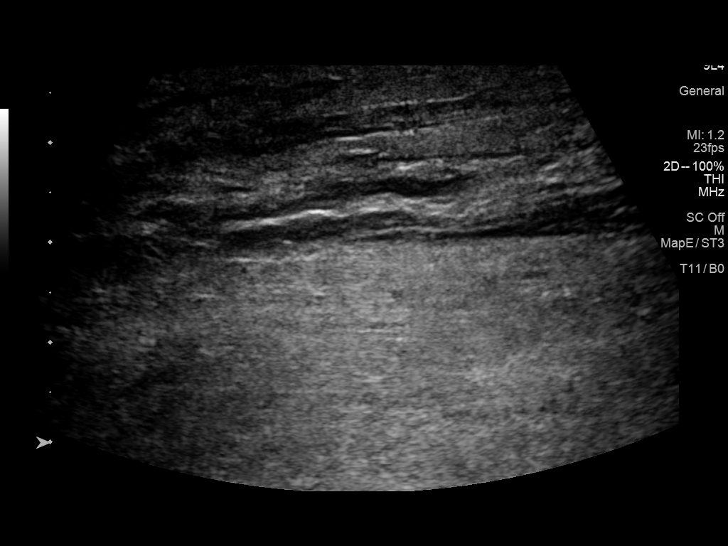
[im 35/43]
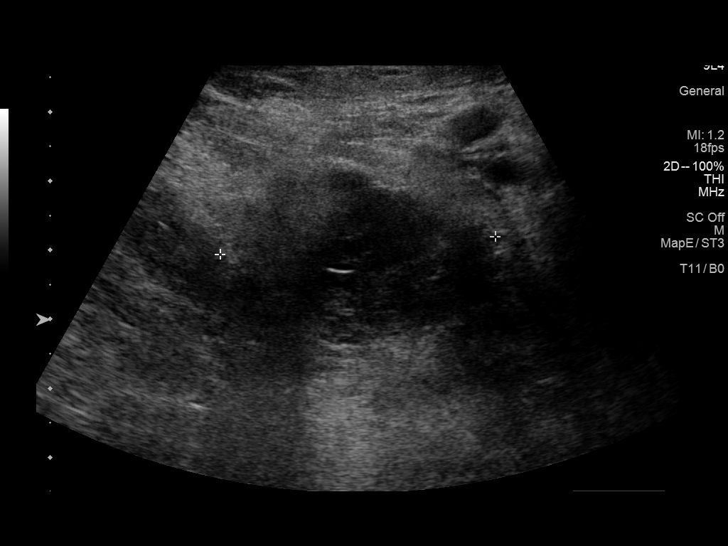
[im 39/43]
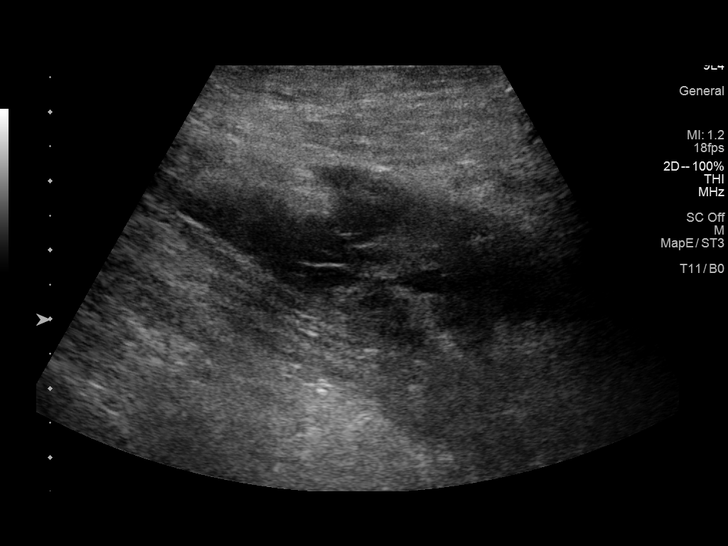
[im 43/43]
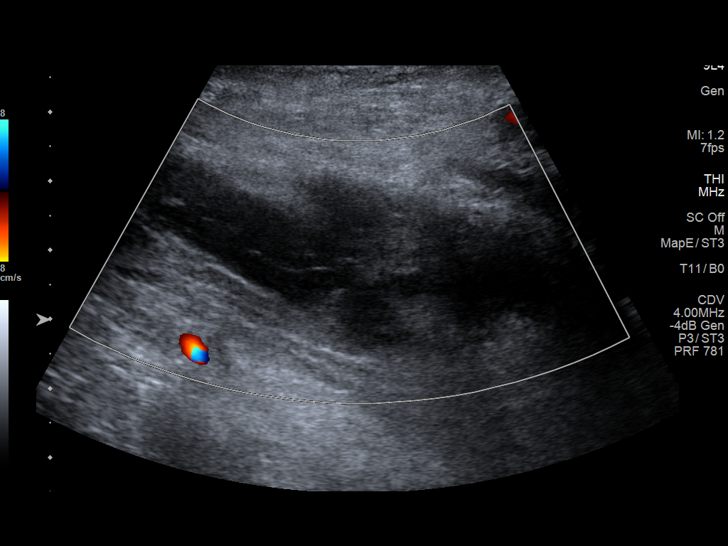

[13 of 24 positions shown; findings below may reference images not displayed]

FINDINGS: Contralateral Common Femoral Vein: Respiratory phasicity is normal
and symmetric with the symptomatic side. No evidence of thrombus.
Normal compressibility.

Common Femoral Vein: No evidence of thrombus. Normal
compressibility, respiratory phasicity and response to augmentation.

Saphenofemoral Junction: No evidence of thrombus. Normal
compressibility and flow on color Doppler imaging.

Profunda Femoral Vein: No evidence of thrombus. Normal
compressibility and flow on color Doppler imaging.

Femoral Vein: No evidence of thrombus. Normal compressibility,
respiratory phasicity and response to augmentation.

Popliteal Vein: No evidence of thrombus. Normal compressibility,
respiratory phasicity and response to augmentation.

Calf Veins: No evidence of thrombus. Normal compressibility and flow
on color Doppler imaging.

Superficial Great Saphenous Vein: No evidence of thrombus. Normal
compressibility and flow on color Doppler imaging.

Other Findings: Heterogeneously echoic fluid collection in the
medial thigh without internal blood flow and with internal
septations. Greatest diameter measured 6.3 cm.
IMPRESSION: Sonographic survey of the left lower extremity negative for DVT.

Complex fluid collection in the proximal medial thigh, potentially
partially liquified hematoma or seroma. Infection is considered less
likely.

## 2020-05-09 ENCOUNTER — Telehealth: Payer: Self-pay | Admitting: Neurology

## 2020-05-09 NOTE — Telephone Encounter (Signed)
I called the patient.  The MRI of the pelvis does not fully delineate the etiology of the sciatic nerve issue.  There is some metal artifact but it seems to be mainly within the bone itself.  The patient will have Dr. Mayer Camel take a look at the scan as well.  The symptoms came on several months after the hip surgery, I would wonder if it is related to scar tissue.   MRI pelvis 04/25/20:  IMPRESSION: 1. Mild osteoarthritis of the left hip. 2. Prior left femoral neck ORIF with orthopedic hardware in place and surrounding susceptibility which partially obscures adjacent soft tissue and osseous structures. No hardware failure or complication. 3.  No acute osseous injury of the pelvis.

## 2020-05-09 NOTE — Telephone Encounter (Signed)
I called [facility] and they are going to get one of their radiologist to read it.

## 2020-05-15 ENCOUNTER — Ambulatory Visit (HOSPITAL_COMMUNITY)
Admission: RE | Admit: 2020-05-15 | Discharge: 2020-05-15 | Disposition: A | Discharge status: Home / Self Care | Payer: Medicare Other | Source: Ambulatory Visit | Attending: Physician Assistant | Admitting: Physician Assistant

## 2020-05-15 ENCOUNTER — Other Ambulatory Visit: Payer: Self-pay

## 2020-05-15 ENCOUNTER — Ambulatory Visit (INDEPENDENT_AMBULATORY_CARE_PROVIDER_SITE_OTHER): Payer: Medicare Other | Admitting: Physician Assistant

## 2020-05-15 ENCOUNTER — Ambulatory Visit (INDEPENDENT_AMBULATORY_CARE_PROVIDER_SITE_OTHER)
Admission: RE | Admit: 2020-05-15 | Discharge: 2020-05-15 | Disposition: A | Discharge status: Home / Self Care | Payer: Medicare Other | Source: Ambulatory Visit | Attending: Physician Assistant | Admitting: Physician Assistant

## 2020-05-15 VITALS — BP 157/81 | HR 86 | Temp 98.4°F | Resp 20 | Ht 62.0 in | Wt 99.0 lb

## 2020-05-15 DIAGNOSIS — I779 Disorder of arteries and arterioles, unspecified: Secondary | ICD-10-CM

## 2020-05-15 DIAGNOSIS — I70213 Atherosclerosis of native arteries of extremities with intermittent claudication, bilateral legs: Secondary | ICD-10-CM

## 2020-05-15 DIAGNOSIS — I739 Peripheral vascular disease, unspecified: Secondary | ICD-10-CM

## 2020-05-15 DIAGNOSIS — C3431 Malignant neoplasm of lower lobe, right bronchus or lung: Secondary | ICD-10-CM | POA: Insufficient documentation

## 2020-05-15 NOTE — Progress Notes (Signed)
Office Note     CC:  follow up Requesting Provider:  Shon Baton, MD  HPI: Beth Daniels is a 74 y.o. (07/04/1946) female who presents for follow up of peripheral artery disease. She has history of Left SFA stenting in April of 2018. She subsequently has had DCB angioplasty for in stent restenosis in March of 2019 and again in November of 2019. In November she had cutting balloon angioplasty of the left SFA and standard balloon angioplasty of the Right EIA. In September of 2020 Dr.Brabham placed a drug eluting stent in her left SFA. She has been doing well since without any claudication or rest pain symptoms. At time of last visit she had a knot in her left thigh. Dr. Trula Slade examined this and it was not arterial in origin. He discussed this with her PCP, Dr. Virgina Jock. She otherwise was instructed to follow up in 6 months  She presents today for her 6 month follow up and to review her vascular studies. She reports that she has had numerous studies on her LLE due to sciatic pain and neuropathy like pains in the leg and heel, left greater than RLE. She had MRI of her left thigh which showed that the concerning knots were screws from her femur fracture. She also more recently has had nerve conduction studies performed. She reports a lot of stabbing pains down left leg and pins and needles in her left heel more so than right. This occurs mostly with sitting down or when she is lying down at night. She feels that ambulation and standing actually make her symptoms better. She reports no claudication like symptoms, rest pain or tissue loss. She does not ambulate extensively outside of her home due to unsteady balance and requiring Oxygen as well as need to use rolling walker.   The pt is on a statin for cholesterol management.  The pt is on a daily aspirin.   Other AC:  Plavix The pt is not on medication  for hypertension.   The pt is not diabetic.  Tobacco hx: Former, 2018  Past Medical History:  Diagnosis Date   . Alcohol abuse   . Cancer [facility])    breast right  . Chronic hyponatremia   . COPD (chronic obstructive pulmonary disease) ([place])   . DCIS (ductal carcinoma in situ)   . Emphysema of lung ([place])   . Osteoporosis   . Overactive bladder   . PVD (peripheral vascular disease) ([place])   . Tobacco use     Past Surgical History:  Procedure Laterality Date  . ABDOMINAL AORTOGRAM W/LOWER EXTREMITY N/A 03/15/2019   Procedure: ABDOMINAL AORTOGRAM W/LOWER EXTREMITY;  Surgeon: Serafina Mitchell, MD;  Location: [facility];  Service: Cardiovascular;  Laterality: N/A;  . aeptoplasty    . cartaract extraction    . INTRAMEDULLARY (IM) NAIL INTERTROCHANTERIC Left 01/30/2019   Procedure: INTRAMEDULLARY (IM) NAIL INTERTROCHANTRIC;  Surgeon: Frederik Pear, MD;  Location: [facility];  Service: Orthopedics;  Laterality: Left;  . IR FIBRIN GLUE REPAIR ANAL FISTULA    . MASTECTOMY     s/p bl  . PERIPHERAL VASCULAR INTERVENTION  03/15/2019   Procedure: PERIPHERAL VASCULAR INTERVENTION;  Surgeon: Serafina Mitchell, MD;  Location: [place] [facility];  Service: Cardiovascular;;  Lt. SFA  . PERIPHERAL VASCULAR THROMBECTOMY  03/15/2019   Procedure: PERIPHERAL VASCULAR THROMBECTOMY;  Surgeon: Serafina Mitchell, MD;  Location: [place] [facility];  Service: Cardiovascular;;  Lt. AT, PT  . RETINAL DETACHMENT SURGERY    .  sclerotherapy vein leg    . vitreous retinal surgery      Social History   Socioeconomic History  . Marital status: Married    Spouse name: Elenore Rota  . Number of children: Not on file  . Years of education: Not on file  . Highest education level: Not on file  Occupational History  . Occupation: RETIRED  Tobacco Use  . Smoking status: Former Smoker    Packs/day: 1.50    Years: 50.00    Pack years: 75.00    Types: Cigarettes    Start date: 69    Quit date: 10/06/2016    Years since quitting: 3.6  . Smokeless tobacco: Never Used  Vaping Use  . Vaping Use: Never used  Substance and Sexual Activity   . Alcohol use: Yes    Alcohol/week: 28.0 standard drinks    Types: 28 Glasses of wine per week    Comment: 4 glasses wine a night  . Drug use: Not Currently  . Sexual activity: Not on file  Other Topics Concern  . Not on file  Social History Narrative   Right handed, no children, one story with stairs.    Lives with husband   Drinks 2 cups caffeine daily   Social Determinants of Health   Financial Resource Strain:   . Difficulty of Paying Living Expenses: Not on file  Food Insecurity:   . Worried About Charity fundraiser in the Last Year: Not on file  . Ran Out of Food in the Last Year: Not on file  Transportation Needs:   . Lack of Transportation (Medical): Not on file  . Lack of Transportation (Non-Medical): Not on file  Physical Activity:   . Days of Exercise per Week: Not on file  . Minutes of Exercise per Session: Not on file  Stress:   . Feeling of Stress : Not on file  Social Connections:   . Frequency of Communication with Friends and Family: Not on file  . Frequency of Social Gatherings with Friends and Family: Not on file  . Attends Religious Services: Not on file  . Active Member of Clubs or Organizations: Not on file  . Attends Archivist Meetings: Not on file  . Marital Status: Not on file  Intimate Partner Violence:   . Fear of Current or Ex-Partner: Not on file  . Emotionally Abused: Not on file  . Physically Abused: Not on file  . Sexually Abused: Not on file   Family History  Problem Relation Age of Onset  . Alzheimer's disease Mother   . Pneumonia Father        aspiration pneumonia    Current Outpatient Medications  Medication Sig Dispense Refill  . albuterol (PROVENTIL) (2.5 MG/3ML) 0.083% nebulizer solution Take 3 mLs (2.5 mg total) by nebulization every 4 (four) hours as needed for wheezing or shortness of breath. 75 mL 3  . albuterol (VENTOLIN HFA) 108 (90 Base) MCG/ACT inhaler Inhale 1 puff into the lungs every 6 (six) hours as  needed for wheezing or shortness of breath. 18 g 3  . aspirin EC 81 MG tablet Take 81 mg by mouth daily.    [name] azelastine (ASTELIN) 0.1 % nasal spray Place 1 spray into both nostrils 2 (two) times daily. Use in each nostril as directed 30 mL 11  . Azelastine-Fluticasone 137-50 MCG/ACT SUSP Place 1 spray into the nose daily. 23 g 6  . cetirizine (ZYRTEC) 10 MG tablet cetirizine 10 mg tablet    .  conjugated estrogens (PREMARIN) vaginal cream Place 1 Applicatorful vaginally. Three times a week M/W/F    . Cranberry 400 MG CAPS Take 400 mg by mouth daily at 2 PM.     . denosumab (PROLIA) 60 MG/ML SOSY injection Inject 60 mg into the skin every 6 (six) months.     . docusate sodium (COLACE) 100 MG capsule Take 1 capsule (100 mg total) by mouth 2 (two) times daily. (Patient taking differently: Take 100 mg by mouth at bedtime. ) 10 capsule 0  . Ferrous Fumarate (HEMOCYTE - 106 MG FE) 324 (106 Fe) MG TABS tablet Take 1 tablet by mouth daily at 3 pm.     . fluticasone (FLONASE) 50 MCG/ACT nasal spray Place 2 sprays into both nostrils 2 (two) times daily.    . folic acid (FOLVITE) 1 MG tablet Take 1 mg by mouth daily.    [name] gabapentin (NEURONTIN) 100 MG capsule TAKE 2 CAPSULES THREE TIMES A DAY (Patient taking differently: Occasionally takes an extra tablet when leg is hurting) 540 capsule 1  . gabapentin (NEURONTIN) 300 MG capsule Take 1 capsule (300 mg total) by mouth 3 (three) times daily. 270 capsule 3  . guaiFENesin (MUCINEX) 600 MG 12 hr tablet Take 600 mg by mouth 2 (two) times daily.     . Multiple Vitamin (MULTI-VITAMIN) tablet Take 1 tablet by mouth daily.     . mupirocin ointment (BACTROBAN) 2 % Apply 1 application topically daily as needed (wound care).     . MYRBETRIQ 25 MG TB24 tablet Take 25 mg by mouth daily.    . nicotine polacrilex (COMMIT) 4 MG lozenge Take 4 mg by mouth as needed for smoking cessation.     . OXYGEN 2 liters at hs    . pantoprazole (PROTONIX) 40 MG tablet Take 40 mg by  mouth at bedtime.     . sodium chloride (OCEAN) 0.65 % SOLN nasal spray Place 1 spray into both nostrils as needed for congestion.    [name] umeclidinium-vilanterol (ANORO ELLIPTA) 62.5-25 MCG/INH AEPB Inhale 1 puff into the lungs daily. 90 each 3  . Vitamin D, Cholecalciferol, 25 MCG (1000 UT) TABS Take 1,000 Units by mouth daily.    [name] atorvastatin (LIPITOR) 20 MG tablet Take 20 mg by mouth at bedtime.      No current facility-administered medications for this visit.    No Known Allergies   REVIEW OF SYSTEMS:  [X]  denotes positive finding, [ ]  denotes negative finding Cardiac  Comments:  Chest pain or chest pressure:    Shortness of breath upon exertion:    Short of breath when lying flat:    Irregular heart rhythm:        Vascular    Pain in calf, thigh, or hip brought on by ambulation:    Pain in feet at night that wakes you up from your sleep:     Blood clot in your veins:    Leg swelling:         Pulmonary    Oxygen at home:    Productive cough:     Wheezing:         Neurologic    Sudden weakness in arms or legs:     Sudden numbness in arms or legs:     Sudden onset of difficulty speaking or slurred speech:    Temporary loss of vision in one eye:     Problems with dizziness:         Gastrointestinal  Blood in stool:     Vomited blood:         Genitourinary    Burning when urinating:     Blood in urine:        Psychiatric    Major depression:         Hematologic    Bleeding problems:    Problems with blood clotting too easily:        Skin    Rashes or ulcers:        Constitutional    Fever or chills:      PHYSICAL EXAMINATION:  Vitals:   05/15/20 1414  BP: (!) 157/81  Pulse: 86  Resp: 20  Temp: 98.4 F (36.9 C)  TempSrc: Temporal  SpO2: 97%  Weight: 99 lb (44.9 kg)  Height: 5\' 2"  (1.575 m)    General:  WDWN in NAD; vital signs documented above Gait: Normal, ambulates with rolling walker HENT: WNL, normocephalic Pulmonary: normal  non-labored breathing , without wheezing Cardiac: regular HR, without  Murmurs without carotid bruit Abdomen: flat Vascular Exam/Pulses:  Right Left  Radial 2+ (normal) 2+ (normal)  Femoral 2+ (normal) 2+ (normal)  Popliteal 2+ (normal) 2+ (normal)  DP 1+ (weak) 1+ (weak)  PT Not palpable Not palpable   Extremities: without ischemic changes, without Gangrene , without cellulitis; without open wounds;  Musculoskeletal: no muscle wasting or atrophy  Neurologic: A&O X 3;  No focal weakness or paresthesias are detected Psychiatric:  The pt has Normal affect.   Non-Invasive Vascular Imaging:   +-------+-----------+-----------+------------+------------+  ABI/TBIToday's ABIToday's TBIPrevious ABIPrevious TBI  +-------+-----------+-----------+------------+------------+  Right 0.96    1.06    0.82    0.46      +-------+-----------+-----------+------------+------------+  Left  1.08    0.86    1.01    0.63      +-------+-----------+-----------+------------+------------+  Left great toe pressure: 112 mmHg Right great toe pressure : 142 mmHg  Left lower extremity arterial duplex independently reviewed. There is 30-49% stenosis noted in the left Common femoral artery. The left SFA is patent throughout previously placed stents with biphasic flow. She has three vessel runoff. She does have monophasic flow in the peroneal and PTA arteries. There appears very diminished flow in the distal PTA with some improvement at level of collateral. Higher velocities in the CFA were not reproducible on this duplex compared to prior study in May of 2021   ASSESSMENT/PLAN:: 74 y.o. female here for follow up for peripheral artery disease. She his history of multiple endovascular interventions on her LLE. Most recently she had stenting of her left SFA by Dr. Trula Slade in September of 2020. She is currently not having any symptoms relatable to her vascular disease. I feel  that most of her symptoms are neuropathic in nature. Her non invasive studies today are essentially unchanged. She has stable ABI/ TBI bilaterally. Her duplex shows patent flow in the left lower extremity. She does have some diminished flow into the distal PT and peroneal arteries but otherwise biphasic flow throughout - She will continue her Aspirin and Statin medication - I reviewed signs and symptoms of worsening arterial disease and should these occur she will follow up sooner - She will follow up with LLE arterial duplex and ABI's in 6 months   Karoline Caldwell, PA-C [facility] [phone]  Clinic MD:   Dr. Carlis Abbott

## 2020-06-05 ENCOUNTER — Other Ambulatory Visit: Payer: Self-pay | Admitting: Pulmonary Disease

## 2020-06-22 ENCOUNTER — Other Ambulatory Visit: Payer: Self-pay | Admitting: Neurology

## 2020-07-03 ENCOUNTER — Telehealth: Payer: Self-pay | Admitting: Pulmonary Disease

## 2020-07-03 NOTE — Telephone Encounter (Signed)
Called and spoke with pt and she is aware of appt scheduled with AO on 1/28 at 12.  Per AO last OV note from 11/15/19, the CT needed to be repeated in 1 year.  She will discuss this with AO at her next OV.

## 2020-07-27 IMAGING — CT CT CHEST W/O CM
2 of 4 series · 13 of 36 positions shown, 16 images · non-contrast
Comparison: 11/19/2018

CLINICAL DATA: Follow-up for lung nodule. Former smoker. Patient
has COPD and is on oxygen. History of bilateral mastectomies for
breast carcinoma.

EXAM:
CT CHEST WITHOUT CONTRAST
TECHNIQUE: Multidetector CT imaging of the chest was performed following the
standard protocol without IV contrast.

[Series 2: chest 2.00 br40 s3 · axial · 0.48mm/px · z∈[+1705,+1981]mm · 10 of 164 slices shown, 13 images (1 of 2)]
[im 13/164  mediastinal]
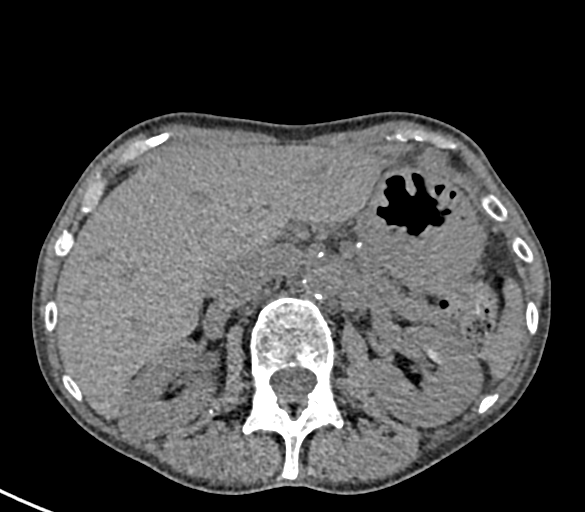
[im 13/164  lung]
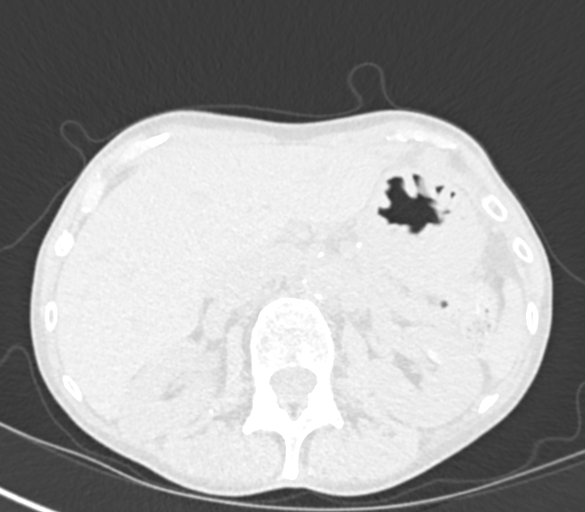
[im 26/164  lung]
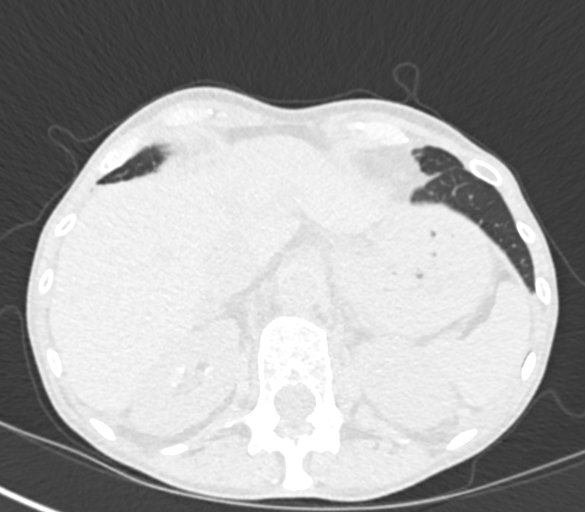
[im 51/164  lung]
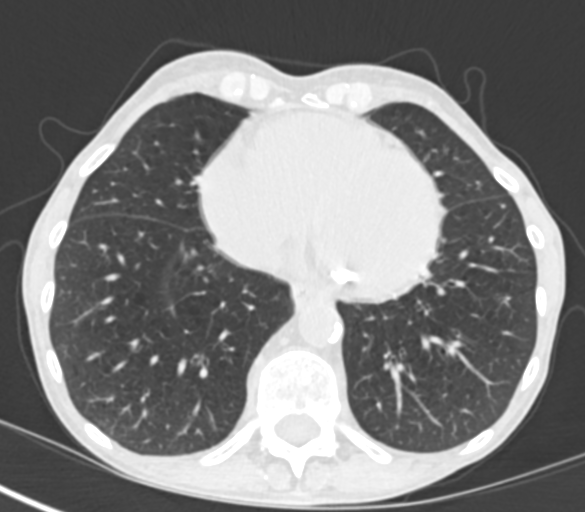
[im 63/164  lung]
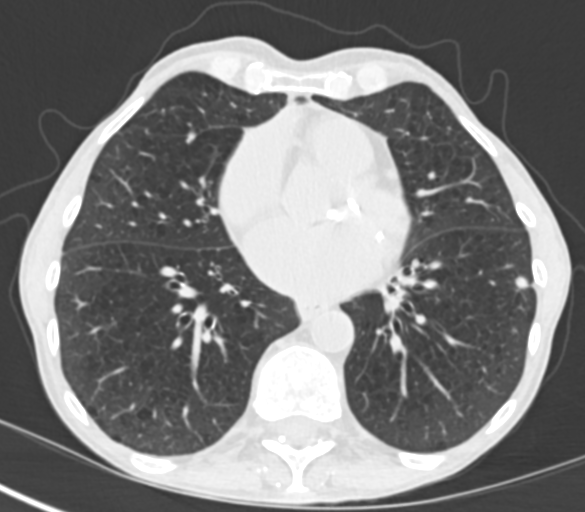
[im 76/164  mediastinal]
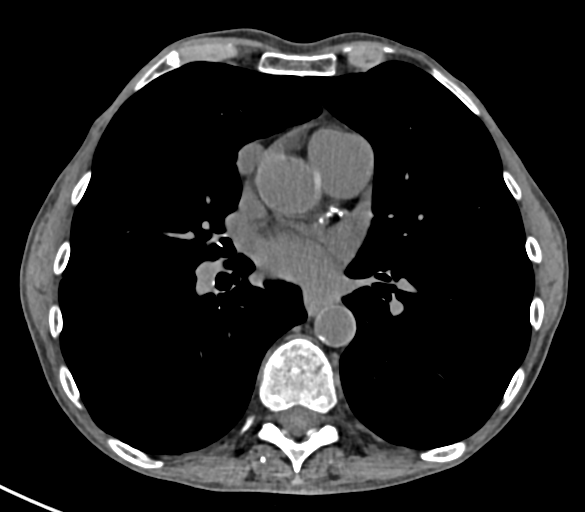
[im 76/164  lung]
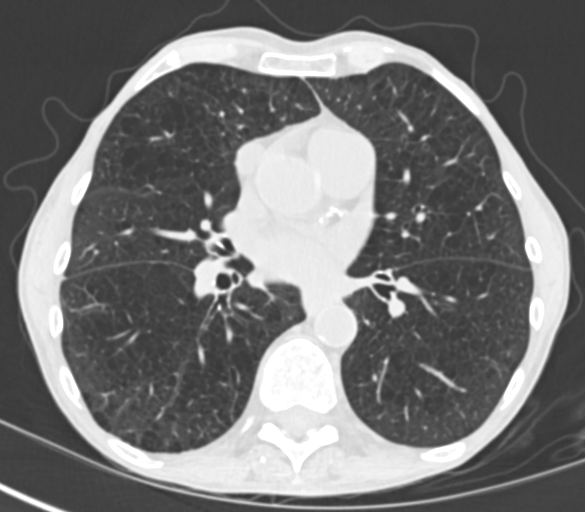
[im 88/164  lung]
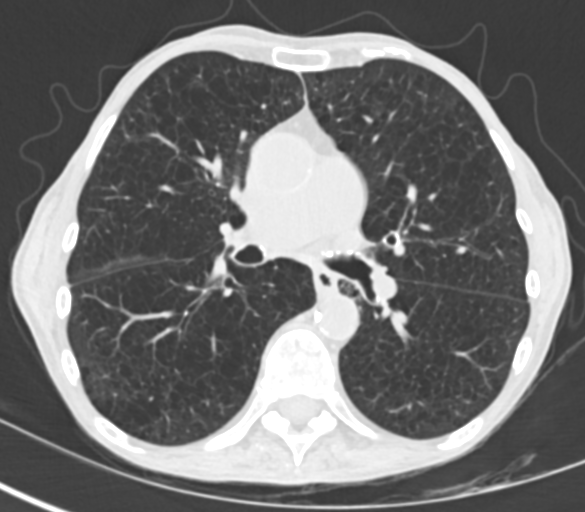
[im 101/164  lung]
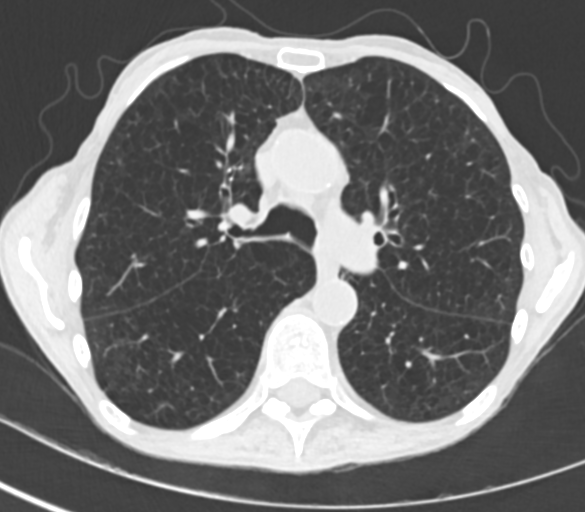
[im 126/164  lung]
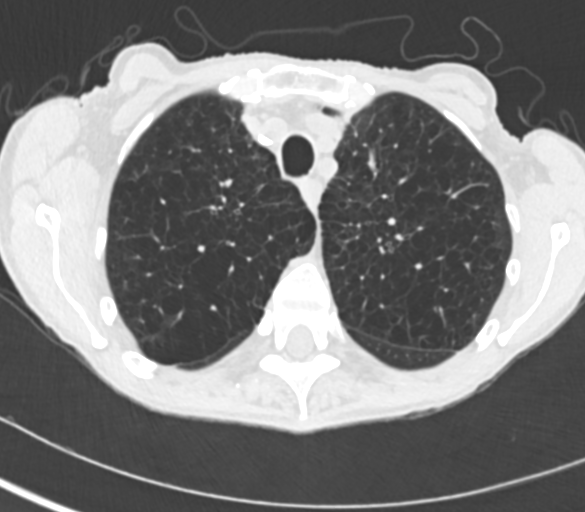
[im 138/164  mediastinal]
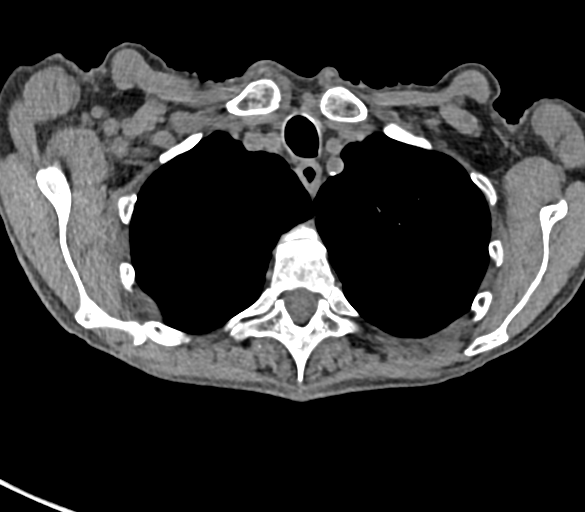
[im 138/164  lung]
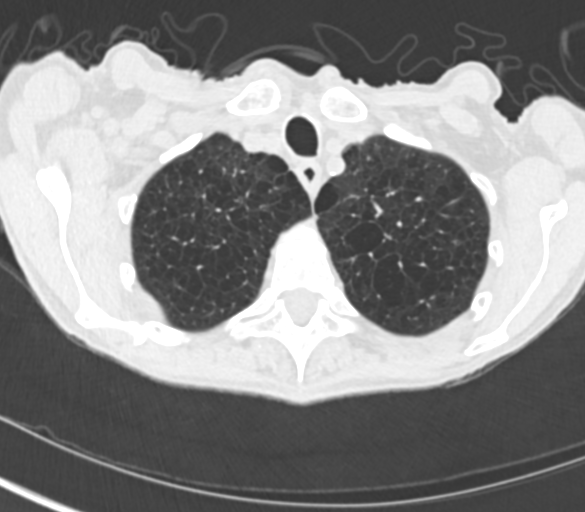
[im 151/164  lung]
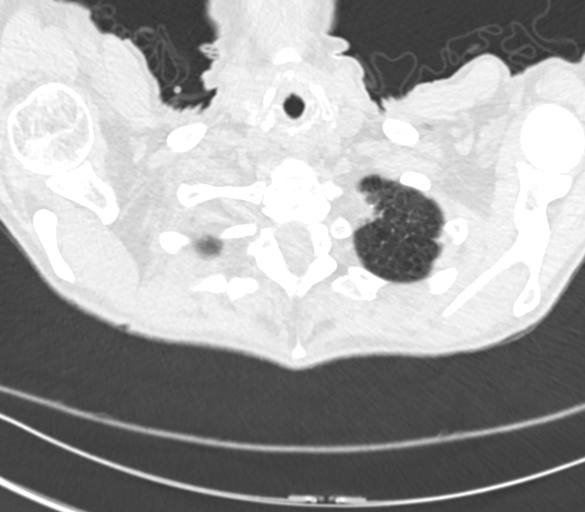

[Series 4: chest 2.00 br40 s3 · coronal · 0.55mm/px · 3 of 123 slices shown (2 of 2)]
[im 25/123  lung]
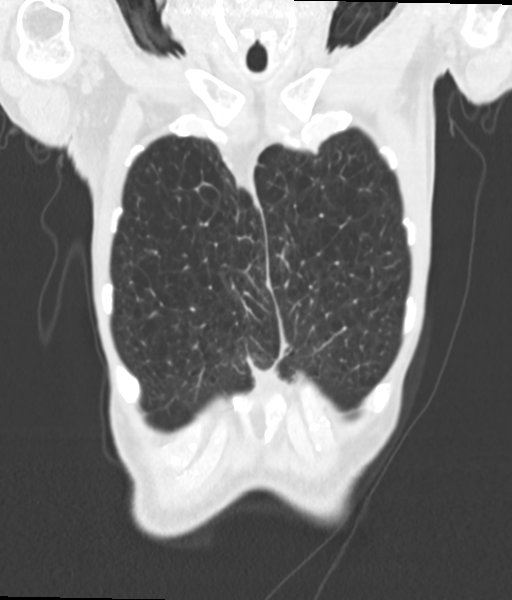
[im 49/123  lung]
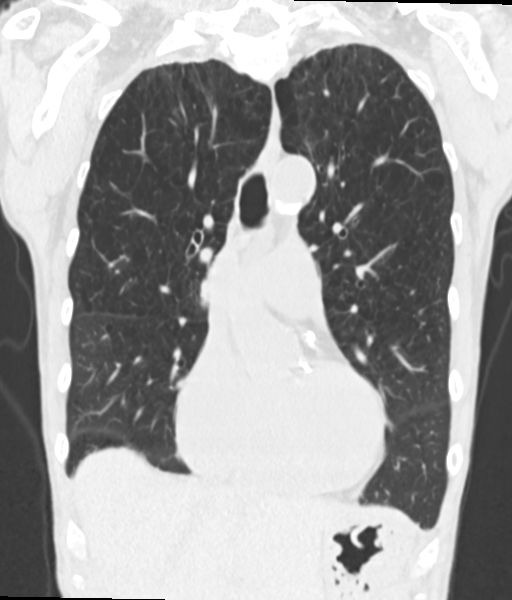
[im 74/123  lung]
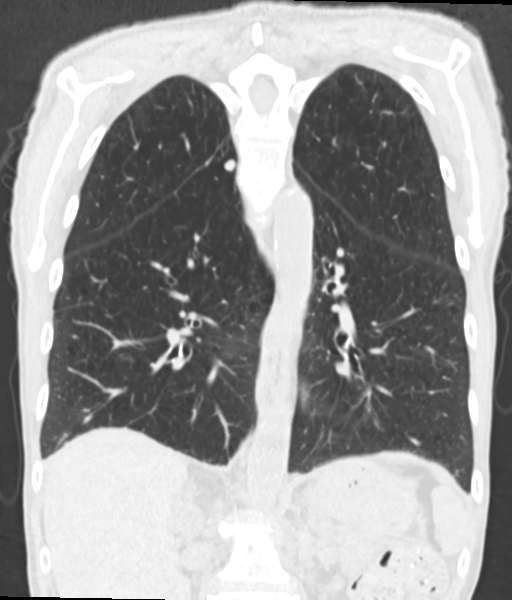

[13 of 36 positions shown; findings below may reference images not displayed]

FINDINGS: Cardiovascular: Heart normal in size. No pericardial effusion. Left
and circumflex coronary artery calcifications. Slight calcification
along the right coronary artery. Aortic atherosclerotic
calcifications. Aorta normal in caliber.

Mediastinum/Nodes: No neck base, axillary, mediastinal or hilar
masses or enlarged lymph nodes. Trachea and esophagus are
unremarkable.

Lungs/Pleura: Multiple pulmonary nodules.

Round nodule, anteromedial right upper lobe, image 50, series 8, 8
mm.

Lobulated nodule, superior segment of the right lower lobe, image
46, series [DATE] x 6 mm, mean 7.5 mm.

5 mm nodule, left lower lobe, image 102, series 8.

Small irregular nodule, left upper lobe lingula, 3-4 mm, image 108,
series 8.

Additional tiny nodules less than 3 mm, left lower lobe, image 123
and image 114.

No evidence of pneumonia or pulmonary edema.

Advanced centrilobular emphysema. No pleural effusion or
pneumothorax. Stable pleuroparenchymal scarring at the lung apices.

Upper Abdomen: No acute findings.

Musculoskeletal: Fracture of T12 anterior wedging moderate loss of
the vertebral body height. The fracture extends transversely across
the mid to upper vertebra with an additional fracture line extending
to the upper endplate. A bony fragment protrudes into the ventral
spinal canal by 4-5 mm. This fracture appears new from the prior CT.

Stable changes from bilateral mastectomies.  No chest wall masses.
IMPRESSION: 1. Pulmonary nodules as detailed above, all stable from prior CT.
Recommend follow-up CT scan in November 2019 to assess for longer term
stability, given the underlying changes of emphysema and history of
breast carcinoma.
2. Fracture of T12 that appears new from the prior CT. Mild
retropulsion the posterior upper portion of the T12 vertebra
encroaching into the ventral spinal canal by 4-5 mm. This could be
further assessed with dedicated thoracic spine CT or thoracic spine
MRI.
3. No acute findings in the lungs.
4. Aortic atherosclerosis and coronary artery calcifications.
5. Advanced emphysema.

Aortic Atherosclerosis (R9LUK-[id number].N) and Emphysema (R9LUK-59E.2).

## 2020-08-03 ENCOUNTER — Encounter: Payer: Self-pay | Admitting: Pulmonary Disease

## 2020-08-03 ENCOUNTER — Ambulatory Visit (INDEPENDENT_AMBULATORY_CARE_PROVIDER_SITE_OTHER): Payer: Medicare Other | Admitting: Pulmonary Disease

## 2020-08-03 ENCOUNTER — Other Ambulatory Visit: Payer: Self-pay

## 2020-08-03 VITALS — BP 140/70 | HR 81 | Temp 97.6°F | Ht 62.0 in | Wt 97.8 lb

## 2020-08-03 DIAGNOSIS — J449 Chronic obstructive pulmonary disease, unspecified: Secondary | ICD-10-CM

## 2020-08-03 NOTE — Patient Instructions (Signed)
Continue with oxygen around-the-clock  Graded exercise as tolerated  Continue bronchodilator treatments  I will see you back in 6 months  Call with significant concerns

## 2020-08-03 NOTE — Progress Notes (Signed)
Ct                Beth Daniels    [id number]    [birth year]  Primary Care Physician:Russo, [name], MD  Referring Physician: Shon Baton, [place] [address],  [place] [address]  Chief complaint:   Follow-up care for chronic obstructive pulmonary disease, multiple lung nodules  HPI: Has been stable since the last visit Still has a lot of pain in her leg Limited activity level secondary to multiple comorbidities She does have a cough and usually with early morning sputum production  She was following up at [facility] were sent over which were reviewed  Chronically on oxygen supplementation  CT scan shows multiple nodules which are stable, but new nodule that was 4 mm is also stable, no new nodules  She does have advanced chronic obstructive pulmonary disease, extensive emphysema  Tolerated rehab well for broken hip  She is using oxygen supplementation at about 3 L, mostly at night and more frequently following a recent fall  Has been complaining of some left lower thigh pain-being worked up  She does have peripheral arterial disease, significant peripheral neuropathy History of breast cancer-right breast in 2013  Compliant with nebulization use with albuterol and MDI use On Anoro long-term  Reformed smoker  Limited with activities  Outpatient Encounter Medications as of 08/03/2020  Medication Sig  . albuterol (PROVENTIL) (2.5 MG/3ML) 0.083% nebulizer solution Take 3 mLs (2.5 mg total) by nebulization every 4 (four) hours as needed for wheezing or shortness of breath.  [name] aspirin EC 81 MG tablet Take 81 mg by mouth daily.  [name] atorvastatin (LIPITOR) 20 MG tablet Take 20 mg by mouth at bedtime.   [name] azelastine (ASTELIN) 0.1 % nasal spray Place 1 spray into both nostrils 2 (two) times daily. Use in each nostril as directed  . Azelastine-Fluticasone 137-50 MCG/ACT SUSP Place 1 spray into the nose daily.  . cetirizine (ZYRTEC) 10 MG tablet cetirizine 10 mg tablet  .  conjugated estrogens (PREMARIN) vaginal cream Place 1 Applicatorful vaginally. Three times a week M/W/F  . Cranberry 400 MG CAPS Take 400 mg by mouth daily at 2 PM.   . denosumab (PROLIA) 60 MG/ML SOSY injection Inject 60 mg into the skin every 6 (six) months.   . docusate sodium (COLACE) 100 MG capsule Take 1 capsule (100 mg total) by mouth 2 (two) times daily. (Patient taking differently: Take 100 mg by mouth at bedtime.)  . Ferrous Fumarate (HEMOCYTE - 106 MG FE) 324 (106 Fe) MG TABS tablet Take 1 tablet by mouth daily at 3 pm.   . fluticasone (FLONASE) 50 MCG/ACT nasal spray Place 2 sprays into both nostrils 2 (two) times daily.  . folic acid (FOLVITE) 1 MG tablet Take 1 mg by mouth daily.  [name] gabapentin (NEURONTIN) 100 MG capsule TAKE 2 CAPSULES THREE TIMES A DAY (Patient taking differently: Occasionally takes an extra tablet when leg is hurting)  . gabapentin (NEURONTIN) 300 MG capsule Take 1 capsule (300 mg total) by mouth 3 (three) times daily.  [name] guaiFENesin (MUCINEX) 600 MG 12 hr tablet Take 600 mg by mouth 2 (two) times daily.  . Multiple Vitamin (MULTI-VITAMIN) tablet Take 1 tablet by mouth daily.   . mupirocin ointment (BACTROBAN) 2 % Apply 1 application topically daily as needed (wound care).   . MYRBETRIQ 50 MG TB24 tablet Take 50 mg by mouth daily.  . nicotine polacrilex (COMMIT) 4 MG lozenge Take 4 mg by mouth  as needed for smoking cessation.   . OXYGEN 2 liters at hs  . pantoprazole (PROTONIX) 40 MG tablet Take 40 mg by mouth at bedtime.   [name] PROAIR HFA 108 (90 Base) MCG/ACT inhaler USE 1 INHALATION EVERY 6 HOURS AS NEEDED FOR WHEEZING OR SHORTNESS OF BREATH  . sodium chloride (OCEAN) 0.65 % SOLN nasal spray Place 1 spray into both nostrils as needed for congestion.  [name] umeclidinium-vilanterol (ANORO ELLIPTA) 62.5-25 MCG/INH AEPB Inhale 1 puff into the lungs daily.  . Vitamin D, Cholecalciferol, 25 MCG (1000 UT) TABS Take 1,000 Units by mouth daily.  . [DISCONTINUED] MYRBETRIQ 25  MG TB24 tablet Take 25 mg by mouth daily.   No facility-administered encounter medications on file as of 08/03/2020.    Allergies as of 08/03/2020  . (No Known Allergies)    Past Medical History:  Diagnosis Date  . Alcohol abuse   . Cancer [facility])    breast right  . Chronic hyponatremia   . COPD (chronic obstructive pulmonary disease) ([place])   . DCIS (ductal carcinoma in situ)   . Emphysema of lung ([place])   . Osteoporosis   . Overactive bladder   . PVD (peripheral vascular disease) ([place])   . Tobacco use     Past Surgical History:  Procedure Laterality Date  . ABDOMINAL AORTOGRAM W/LOWER EXTREMITY N/A 03/15/2019   Procedure: ABDOMINAL AORTOGRAM W/LOWER EXTREMITY;  Surgeon: Serafina Mitchell, MD;  Location: [facility];  Service: Cardiovascular;  Laterality: N/A;  . aeptoplasty    . cartaract extraction    . INTRAMEDULLARY (IM) NAIL INTERTROCHANTERIC Left 01/30/2019   Procedure: INTRAMEDULLARY (IM) NAIL INTERTROCHANTRIC;  Surgeon: Frederik Pear, MD;  Location: [facility];  Service: Orthopedics;  Laterality: Left;  . IR FIBRIN GLUE REPAIR ANAL FISTULA    . MASTECTOMY     s/p bl  . PERIPHERAL VASCULAR INTERVENTION  03/15/2019   Procedure: PERIPHERAL VASCULAR INTERVENTION;  Surgeon: Serafina Mitchell, MD;  Location: [facility];  Service: Cardiovascular;;  Lt. SFA  . PERIPHERAL VASCULAR THROMBECTOMY  03/15/2019   Procedure: PERIPHERAL VASCULAR THROMBECTOMY;  Surgeon: Serafina Mitchell, MD;  Location: [facility];  Service: Cardiovascular;;  Lt. AT, PT  . RETINAL DETACHMENT SURGERY    . sclerotherapy vein leg    . vitreous retinal surgery      Family History  Problem Relation Age of Onset  . Alzheimer's disease Mother   . Pneumonia Father        aspiration pneumonia    Social History   Socioeconomic History  . Marital status: Married    Spouse name: Elenore Rota  . Number of children: Not on file  . Years of education: Not on file  . Highest education level: Not on file   Occupational History  . Occupation: RETIRED  Tobacco Use  . Smoking status: Former Smoker    Packs/day: 1.50    Years: 50.00    Pack years: 75.00    Types: Cigarettes    Start date: 98    Quit date: 10/06/2016    Years since quitting: 3.8  . Smokeless tobacco: Never Used  Vaping Use  . Vaping Use: Never used  Substance and Sexual Activity  . Alcohol use: Yes    Alcohol/week: 28.0 standard drinks    Types: 28 Glasses of wine per week    Comment: 4 glasses wine a night  . Drug use: Not Currently  . Sexual activity: Not on file  Other Topics Concern  .  Not on file  Social History Narrative   Right handed, no children, one story with stairs.    Lives with husband   Drinks 2 cups caffeine daily   Social Determinants of Health   Financial Resource Strain: Not on file  Food Insecurity: Not on file  Transportation Needs: Not on file  Physical Activity: Not on file  Stress: Not on file  Social Connections: Not on file  Intimate Partner Violence: Not on file    Review of Systems  Constitutional: Positive for fatigue.  HENT: Negative.   Respiratory: Positive for shortness of breath.   Cardiovascular: Negative for chest pain and leg swelling.  Gastrointestinal: Negative.   Endocrine: Negative.   Genitourinary: Negative.   All other systems reviewed and are negative.   Vitals:   08/03/20 1207  BP: 140/70  Pulse: 81  Temp: 97.6 F (36.4 C)  SpO2: 98%     Physical Exam Constitutional:      Appearance: Normal appearance. She is well-developed.  HENT:     Head: Normocephalic and atraumatic.  Eyes:     General:        Right eye: No discharge.        Left eye: No discharge.     Pupils: Pupils are equal, round, and reactive to light.  Cardiovascular:     Rate and Rhythm: Normal rate and regular rhythm.  Pulmonary:     Effort: Pulmonary effort is normal. No respiratory distress.     Breath sounds: Normal breath sounds. No wheezing or rales.  Neurological:      Mental Status: She is alert.  Psychiatric:        Mood and Affect: Mood normal.    Data Reviewed: Records from [facility] reviewed CT scan from 11/07/2019 reviewed showing stable lung nodules CT scan reviewed today 08/03/2020  Assessment:  Chronic obstructive pulmonary disease -Does not appear exacerbated at present  Chronic hypoxemic respiratory failure  Emphysema -Panlobular emphysema  Debility -Multifactorial  Recent femur fracture  Multiple lung nodules with a new nodule noted on last CT, 78-month follow-up showed stable nodules overall  Plan/Recommendations:  Continue oxygen supplementation Continue bronchodilators  Graded exercise as tolerated  Repeat CT scan in about 6 months from now  Follow-up in 6 months   Sherrilyn Rist MD [facility] 08/03/2020, 12:16 PM  CC: Shon Baton, MD

## 2020-08-28 ENCOUNTER — Other Ambulatory Visit: Payer: Self-pay | Admitting: Neurology

## 2020-08-28 NOTE — Telephone Encounter (Signed)
Please have patient request refills through PCP or her current neurologist, Dr. Jannifer Franklin.

## 2020-09-03 ENCOUNTER — Other Ambulatory Visit: Payer: Self-pay | Admitting: Neurology

## 2020-09-03 MED ORDER — GABAPENTIN 100 MG PO CAPS
100.0000 mg | ORAL_CAPSULE | Freq: Three times a day (TID) | ORAL | 1 refills | Status: DC
Start: 2020-09-03 — End: 2020-09-06

## 2020-09-03 MED ORDER — GABAPENTIN 300 MG PO CAPS
300.0000 mg | ORAL_CAPSULE | Freq: Three times a day (TID) | ORAL | 3 refills | Status: DC
Start: 2020-09-03 — End: 2020-09-20

## 2020-09-06 ENCOUNTER — Other Ambulatory Visit: Payer: Self-pay | Admitting: Neurology

## 2020-09-06 MED ORDER — GABAPENTIN 100 MG PO CAPS
200.0000 mg | ORAL_CAPSULE | Freq: Three times a day (TID) | ORAL | 1 refills | Status: DC
Start: 2020-09-06 — End: 2020-09-20

## 2020-09-17 ENCOUNTER — Other Ambulatory Visit: Payer: Self-pay | Admitting: Pulmonary Disease

## 2020-09-20 ENCOUNTER — Ambulatory Visit (INDEPENDENT_AMBULATORY_CARE_PROVIDER_SITE_OTHER): Payer: Medicare Other | Admitting: Neurology

## 2020-09-20 ENCOUNTER — Encounter: Payer: Self-pay | Admitting: Neurology

## 2020-09-20 VITALS — BP 127/72 | HR 90 | Ht 62.0 in | Wt 98.2 lb

## 2020-09-20 DIAGNOSIS — G5702 Lesion of sciatic nerve, left lower limb: Secondary | ICD-10-CM | POA: Diagnosis not present

## 2020-09-20 MED ORDER — GABAPENTIN 300 MG PO CAPS
600.0000 mg | ORAL_CAPSULE | Freq: Three times a day (TID) | ORAL | 3 refills | Status: DC
Start: 2020-09-20 — End: 2021-01-02

## 2020-09-20 MED ORDER — GABAPENTIN 100 MG PO CAPS
100.0000 mg | ORAL_CAPSULE | Freq: Three times a day (TID) | ORAL | 1 refills | Status: DC | PRN
Start: 2020-09-20 — End: 2021-01-02

## 2020-09-20 NOTE — Progress Notes (Signed)
Reason for visit: Left-sided sciatica  Beth Daniels is an 75 y.o. female  History of present illness:  Beth Daniels is a 75 year old right-handed white female with a history of discomfort down the left leg with sitting.  She has undergone MRI of the lumbar spine that was unrevealing, she has had a prior left hip fracture.  The pain down the left leg began after the surgery for her hip.  We did MRI of the pelvis, but no pathology involving compression of the sciatic nerve was noted.  There was however some metal artifact from the hip replacement.  The patient does have some stress incontinence, she has a bedside commode at night.  She notes that when she stands up the pain down the left leg improved significantly.  She has been using padding on her seats which has been helpful.  She has not had any falls since last seen.  She has undergone some physical therapy without much benefit.  She returns to the office today for an evaluation.  She is on gabapentin currently taking 500 mg 3 times daily.  Results of blood work done during a neurologic work-up in January 2015 showed a positive ANA, CK level 65, C-reactive protein of 0.25, immunoelectrophoresis was unremarkable.  Rheumatoid factor was negative, TSH is 1.57, vitamin B12 of 593.  Past Medical History:  Diagnosis Date  . Alcohol abuse   . Cancer [facility])    breast right  . Chronic hyponatremia   . COPD (chronic obstructive pulmonary disease) ([place])   . DCIS (ductal carcinoma in situ)   . Emphysema of lung ([place])   . Osteoporosis   . Overactive bladder   . PVD (peripheral vascular disease) ([place])   . Tobacco use     Past Surgical History:  Procedure Laterality Date  . ABDOMINAL AORTOGRAM W/LOWER EXTREMITY N/A 03/15/2019   Procedure: ABDOMINAL AORTOGRAM W/LOWER EXTREMITY;  Surgeon: Serafina Mitchell, MD;  Location: [facility];  Service: Cardiovascular;  Laterality: N/A;  . aeptoplasty    . cartaract extraction    . INTRAMEDULLARY (IM) NAIL  INTERTROCHANTERIC Left 01/30/2019   Procedure: INTRAMEDULLARY (IM) NAIL INTERTROCHANTRIC;  Surgeon: Frederik Pear, MD;  Location: [facility];  Service: Orthopedics;  Laterality: Left;  . IR FIBRIN GLUE REPAIR ANAL FISTULA    . MASTECTOMY     s/p bl  . PERIPHERAL VASCULAR INTERVENTION  03/15/2019   Procedure: PERIPHERAL VASCULAR INTERVENTION;  Surgeon: Serafina Mitchell, MD;  Location: [facility];  Service: Cardiovascular;;  Lt. SFA  . PERIPHERAL VASCULAR THROMBECTOMY  03/15/2019   Procedure: PERIPHERAL VASCULAR THROMBECTOMY;  Surgeon: Serafina Mitchell, MD;  Location: [facility];  Service: Cardiovascular;;  Lt. AT, PT  . RETINAL DETACHMENT SURGERY    . sclerotherapy vein leg    . vitreous retinal surgery      Family History  Problem Relation Age of Onset  . Alzheimer's disease Mother   . Pneumonia Father        aspiration pneumonia    Social history:  reports that she quit smoking about 3 years ago. Her smoking use included cigarettes. She started smoking about 52 years ago. She has a 75.00 pack-year smoking history. She has never used smokeless tobacco. She reports current alcohol use of about 28.0 standard drinks of alcohol per week. She reports previous drug use.   No Known Allergies  Medications:  Prior to Admission medications   Medication Sig Start Date End Date Taking? Authorizing  Provider  albuterol (PROVENTIL) (2.5 MG/3ML) 0.083% nebulizer solution Take 3 mLs (2.5 mg total) by nebulization every 4 (four) hours as needed for wheezing or shortness of breath. 07/21/19  Yes Olalere, Adewale A, MD  ANORO ELLIPTA 62.5-25 MCG/INH AEPB USE 1 INHALATION DAILY 09/17/20  Yes Olalere, Adewale A, MD  aspirin EC 81 MG tablet Take 81 mg by mouth daily.   Yes [provider]  azelastine (ASTELIN) 0.1 % nasal spray Place 1 spray into both nostrils 2 (two) times daily. Use in each nostril as directed 01/26/20  Yes Olalere, Adewale A, MD  Azelastine-Fluticasone 137-50 MCG/ACT SUSP  Place 1 spray into the nose daily. 12/21/19  Yes Olalere, Adewale A, MD  cetirizine (ZYRTEC) 10 MG tablet cetirizine 10 mg tablet   Yes [provider]  conjugated estrogens (PREMARIN) vaginal cream Place 1 Applicatorful vaginally. Three times a week M/W/F 12/20/18  Yes [provider]  Cranberry 400 MG CAPS Take 400 mg by mouth daily at 2 PM.    Yes [provider]  denosumab (PROLIA) 60 MG/ML SOSY injection Inject 60 mg into the skin every 6 (six) months.    Yes [provider]  docusate sodium (COLACE) 100 MG capsule Take 1 capsule (100 mg total) by mouth 2 (two) times daily. Patient taking differently: Take 100 mg by mouth at bedtime. 02/01/19  Yes Manuella Ghazi, Pratik D, DO  Ferrous Fumarate (HEMOCYTE - 106 MG FE) 324 (106 Fe) MG TABS tablet Take 1 tablet by mouth daily at 3 pm.  02/09/18  Yes [provider]  fluticasone (FLONASE) 50 MCG/ACT nasal spray Place 2 sprays into both nostrils 2 (two) times daily.   Yes [provider]  folic acid (FOLVITE) 1 MG tablet Take 1 mg by mouth daily.   Yes [provider]  gabapentin (NEURONTIN) 100 MG capsule Take 2 capsules (200 mg total) by mouth 3 (three) times daily. 09/06/20  Yes Kathrynn Ducking, MD  gabapentin (NEURONTIN) 300 MG capsule Take 1 capsule (300 mg total) by mouth 3 (three) times daily. 09/03/20  Yes Kathrynn Ducking, MD  guaiFENesin (MUCINEX) 600 MG 12 hr tablet Take 600 mg by mouth 2 (two) times daily.   Yes [provider]  Multiple Vitamin (MULTI-VITAMIN) tablet Take 1 tablet by mouth daily.    Yes [provider]  mupirocin ointment (BACTROBAN) 2 % Apply 1 application topically daily as needed (wound care).  08/28/16  Yes [provider]  MYRBETRIQ 50 MG TB24 tablet Take 50 mg by mouth daily. 07/12/20  Yes [provider]  nicotine polacrilex (COMMIT) 4 MG lozenge Take 4 mg by mouth as needed for smoking cessation.  09/10/16  Yes [provider]   OXYGEN 2 liters at hs   Yes [provider]  pantoprazole (PROTONIX) 40 MG tablet Take 40 mg by mouth at bedtime.  12/01/18  Yes [provider]  PROAIR HFA 108 (90 Base) MCG/ACT inhaler USE 1 INHALATION EVERY 6 HOURS AS NEEDED FOR WHEEZING OR SHORTNESS OF BREATH 06/05/20  Yes Olalere, Adewale A, MD  sodium chloride (OCEAN) 0.65 % SOLN nasal spray Place 1 spray into both nostrils as needed for congestion.   Yes [provider]  Vitamin D, Cholecalciferol, 25 MCG (1000 UT) TABS Take 1,000 Units by mouth daily.   Yes [provider]  atorvastatin (LIPITOR) 20 MG tablet Take 20 mg by mouth at bedtime.  02/15/18 08/03/20  [provider]    ROS:  Out  of a complete 14 system review of symptoms, the patient complains only of the following symptoms, and all other reviewed systems are negative.  Left leg discomfort Walking difficulty Shortness of breath  Blood pressure 127/72, pulse 90, height 5\' 2"  (1.575 m), weight 98 lb 3.2 oz (44.5 kg).  Physical Exam  General: The patient is alert and cooperative at the time of the examination.  The patient is on oxygen.  Skin: No significant peripheral edema is noted.   Neurologic Exam  Mental status: The patient is alert and oriented x 3 at the time of the examination. The patient has apparent normal recent and remote memory, with an apparently normal attention span and concentration ability.   Cranial nerves: Facial symmetry is present. Speech is normal, no aphasia or dysarthria is noted. Extraocular movements are full. Visual fields are full.  Motor: The patient has good strength in all 4 extremities.  Sensory examination: Soft touch sensation is symmetric on the face, arms, and legs.  Coordination: The patient has good finger-nose-finger and heel-to-shin bilaterally.  Gait and station: The patient has a slightly wide-based gait, she uses a walker for ambulation.  Tandem gait is not attempted.   Romberg is negative.  No drift is seen.  The patient can walk on the heels and the toes bilaterally.  Reflexes: Deep tendon reflexes are symmetric.   MRI pelvis 04/25/20:  IMPRESSION: 1. Mild osteoarthritis of the left hip. 2. Prior left femoral neck ORIF with orthopedic hardware in place and surrounding susceptibility which partially obscures adjacent soft tissue and osseous structures. No hardware failure or complication. 3. No acute osseous injury of the pelvis.   MRI lumbar 04/26/20:  IMPRESSION: Abnormal MRI scan of the lumbar spine without contrast showing minor disc changes throughout with facet hypertrophy resulting in only minor posterior canal narrowing at L2-3 to L4-5 but without significant compression.  * MRI scan images were reviewed online. I agree with the written report.   MRI brain 07/28/13:  Impression:  1.  No acute pathology or focal findings to explain gait disturbance. 2.  Mild chronic white matter changes likely age-related small vessel ischemic disease.    Assessment/Plan:  1.  Left sciatic nerve compression syndrome  The patient is having some sciatica type pain with sitting going down the left leg.  The patient reports that she is getting some similar symptoms on the right as well.  The gabapentin may help slightly, but is not fully controlling the pain.  She gets her most relief from padding her seats and standing intermittently.  There does not appear to be an obvious surgical therapy to help the symptoms.  She will follow up here in 6 months.  Will increase the gabapentin to 600 mg 3 times daily.  Jill Alexanders MD 09/20/2020 2:09 PM  [facility] [address] [place], [place] [address]  Phone [phone] Fax [phone]

## 2020-09-20 NOTE — Patient Instructions (Signed)
We will increase the gabapentin to 600 mg three times a day.

## 2020-10-04 NOTE — Telephone Encounter (Signed)
Dr Ander Slade,   We received this message on mychart from patient:   Do you feel it is advisable for me to get the 2nd Covid Booster shot now?  See this [email or web address]  Please advise.

## 2020-10-30 ENCOUNTER — Other Ambulatory Visit: Payer: Self-pay

## 2020-10-30 DIAGNOSIS — I739 Peripheral vascular disease, unspecified: Secondary | ICD-10-CM

## 2020-11-12 ENCOUNTER — Ambulatory Visit (INDEPENDENT_AMBULATORY_CARE_PROVIDER_SITE_OTHER)
Admission: RE | Admit: 2020-11-12 | Discharge: 2020-11-12 | Disposition: A | Discharge status: Home / Self Care | Payer: Medicare Other | Source: Ambulatory Visit | Attending: Surgery | Admitting: Surgery

## 2020-11-12 ENCOUNTER — Ambulatory Visit (HOSPITAL_COMMUNITY)
Admission: RE | Admit: 2020-11-12 | Discharge: 2020-11-12 | Disposition: A | Discharge status: Home / Self Care | Payer: Medicare Other | Source: Ambulatory Visit | Attending: Surgery | Admitting: Surgery

## 2020-11-12 ENCOUNTER — Other Ambulatory Visit: Payer: Self-pay

## 2020-11-12 ENCOUNTER — Ambulatory Visit (INDEPENDENT_AMBULATORY_CARE_PROVIDER_SITE_OTHER): Payer: Medicare Other | Admitting: Physician Assistant

## 2020-11-12 VITALS — BP 148/73 | HR 92 | Temp 98.4°F | Resp 16 | Ht 62.0 in | Wt 96.0 lb

## 2020-11-12 DIAGNOSIS — I739 Peripheral vascular disease, unspecified: Secondary | ICD-10-CM | POA: Diagnosis present

## 2020-11-12 DIAGNOSIS — I779 Disorder of arteries and arterioles, unspecified: Secondary | ICD-10-CM | POA: Diagnosis not present

## 2020-11-12 NOTE — Progress Notes (Signed)
Peripheral Arterial Disease Follow-Up   VASCULAR SURGERY ASSESSMENT & PLAN:   Beth Daniels is a 75 y.o. female whose latest intervention was performed by Dr. Trula Slade on March 15, 2019 due to in-stent stenosis of her left lower extremity.  She underwent abdominal aortogram with bilateral lower extremity runoff.  A drug-eluting stent was placed in her left SFA and she underwent mechanical thrombectomy of the left anterior tibial artery and the left posterior tibial arteries there was also administration of intra-arterial nitroglycerin.  She had previously undergone multiple percutaneous interventions at [facility].  She underwent left superficial femoral artery stenting April 2018.  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  She subsequently had drug-coated balloon angioplasty for in-stent restenosis in March and again in November 2019 with cutting Balloon angioplasty of the left SFA and balloon angioplasty of the right external iliac artery.  Stable peripheral arterial disease.  Palpable bilateral posterior tibial pulses.  No nonhealing wounds.  No significant change in ABIs as compared to 6 months ago.  Patient is without claudication or rest pain. Continue the following medications: aspirin and statin Follow-up in 6 months with ABIs, left lower extremity arterial duplex and right aortoiliac duplex. Follow up sooner should she develop skin issues or pain.  SUBJECTIVE:   The patient denies lower extremity pain with exercise or rest pain.  Denies skin loss or ulceration.  She walks with a rolling walker due to balance issues.  Her activity endurance is limited by lung disease.  She suffers from peripheral neuropathy.  PHYSICAL EXAM:   Vitals:   11/12/20 1209  BP: (!) 148/73  Pulse: 92  Resp: 16  Temp: 98.4 F (36.9 C)  TempSrc: Temporal  SpO2: 96%  Weight: 96 lb (43.5 kg)  Height: 5\' 2"  (1.575 m)    General appearance: Well-developed,  well-nourished in no apparent distress Neurologic: Alert and oriented x 4. Cardiovascular: Heart rate and rhythm are regular.  No carotid bruits. Extremities: Skin intact.  Both feet are warm and well perfused.  Motor function and sensation intact Pulse exam: 2+ radial, femoral, posterior tibial pulses bilaterally   NON-INVASIVE VASCULAR STUDIES   Nov 12, 2020 ABIs: Right ABI is unchanged from 6 months ago and is 0.96 with triphasic waveforms. her toe pressure is 111 Left ABI is 0.9 as compared to 1.08 6 months ago.  Her TBI is 0.62 down from 0.86 6 months ago.  Her toe pressure is 103.  Waveforms are biphasic  Left lower extremity arterial duplex exam reveals stenosis of mid left SFA stent approximated at 50 to 99% and left common femoral artery stenosis 50 to 74%   PROBLEM LIST:    The patient's past medical history, past surgical history, family history, social history, allergy list and medication list are reviewed.   CURRENT MEDS:    Current Outpatient Medications:  .  albuterol (PROVENTIL) (2.5 MG/3ML) 0.083% nebulizer solution, Take 3 mLs (2.5 mg total) by nebulization every 4 (four) hours as needed for wheezing or shortness of breath., Disp: 75 mL, Rfl: 3 .  ANORO ELLIPTA 62.5-25 MCG/INH AEPB, USE 1 INHALATION DAILY, Disp: 180 each, Rfl: 3 .  aspirin EC 81 MG tablet, Take 81 mg by mouth daily., Disp: , Rfl:  .  azelastine (ASTELIN) 0.1 % nasal spray, Place 1 spray into both nostrils 2 (two) times daily. Use in each nostril as directed, Disp: 30 mL, Rfl: 11 .  Azelastine-Fluticasone 137-50 MCG/ACT SUSP, Place 1 spray into the  nose daily., Disp: 23 g, Rfl: 6 .  cetirizine (ZYRTEC) 10 MG tablet, cetirizine 10 mg tablet, Disp: , Rfl:  .  conjugated estrogens (PREMARIN) vaginal cream, Place 1 Applicatorful vaginally. Three times a week M/W/F, Disp: , Rfl:  .  Cranberry 400 MG CAPS, Take 400 mg by mouth daily at 2 PM. , Disp: , Rfl:  .  denosumab (PROLIA) 60 MG/ML SOSY injection,  Inject 60 mg into the skin every 6 (six) months. , Disp: , Rfl:  .  docusate sodium (COLACE) 100 MG capsule, Take 1 capsule (100 mg total) by mouth 2 (two) times daily. (Patient taking differently: Take 100 mg by mouth at bedtime.), Disp: 10 capsule, Rfl: 0 .  Ferrous Fumarate (HEMOCYTE - 106 MG FE) 324 (106 Fe) MG TABS tablet, Take 1 tablet by mouth daily at 3 pm. , Disp: , Rfl:  .  fluticasone (FLONASE) 50 MCG/ACT nasal spray, Place 2 sprays into both nostrils 2 (two) times daily., Disp: , Rfl:  .  folic acid (FOLVITE) 1 MG tablet, Take 1 mg by mouth daily., Disp: , Rfl:  .  gabapentin (NEURONTIN) 100 MG capsule, Take 1 capsule (100 mg total) by mouth 3 (three) times daily as needed., Disp: 270 capsule, Rfl: 1 .  gabapentin (NEURONTIN) 300 MG capsule, Take 2 capsules (600 mg total) by mouth 3 (three) times daily., Disp: 540 capsule, Rfl: 3 .  guaiFENesin (MUCINEX) 600 MG 12 hr tablet, Take 600 mg by mouth 2 (two) times daily., Disp: , Rfl:  .  Multiple Vitamin (MULTI-VITAMIN) tablet, Take 1 tablet by mouth daily. , Disp: , Rfl:  .  mupirocin ointment (BACTROBAN) 2 %, Apply 1 application topically daily as needed (wound care). , Disp: , Rfl:  .  MYRBETRIQ 50 MG TB24 tablet, Take 50 mg by mouth daily., Disp: , Rfl:  .  nicotine polacrilex (COMMIT) 4 MG lozenge, Take 4 mg by mouth as needed for smoking cessation. , Disp: , Rfl:  .  OXYGEN, 2 liters at hs, Disp: , Rfl:  .  pantoprazole (PROTONIX) 40 MG tablet, Take 40 mg by mouth at bedtime. , Disp: , Rfl:  .  PROAIR HFA 108 (90 Base) MCG/ACT inhaler, USE 1 INHALATION EVERY 6 HOURS AS NEEDED FOR WHEEZING OR SHORTNESS OF BREATH, Disp: 17 g, Rfl: 3 .  sodium chloride (OCEAN) 0.65 % SOLN nasal spray, Place 1 spray into both nostrils as needed for congestion., Disp: , Rfl:  .  Vitamin D, Cholecalciferol, 25 MCG (1000 UT) TABS, Take 1,000 Units by mouth daily., Disp: , Rfl:  .  atorvastatin (LIPITOR) 20 MG tablet, Take 20 mg by mouth at bedtime. , Disp: ,  Rfl:    REVIEW OF SYSTEMS:   [X]  denotes positive finding, [ ]  denotes negative finding Cardiac  Comments:  Chest pain or chest pressure:    Shortness of breath upon exertion:    Short of breath when lying flat:    Irregular heart rhythm:        Vascular    Pain in calf, thigh, or hip brought on by ambulation:    Pain in feet at night that wakes you up from your sleep:     Blood clot in your veins:    Leg swelling:         Pulmonary    Oxygen at home:    Productive cough:     Wheezing:         Neurologic    Sudden weakness in  arms or legs:     Sudden numbness in arms or legs:     Sudden onset of difficulty speaking or slurred speech:    Temporary loss of vision in one eye:     Problems with dizziness:         Gastrointestinal    Blood in stool:     Vomited blood:         Genitourinary    Burning when urinating:     Blood in urine:        Psychiatric    Major depression:         Hematologic    Bleeding problems:    Problems with blood clotting too easily:        Skin    Rashes or ulcers:        Constitutional    Fever or chills:     Barbie Banner, PA-C  Office: [phone] 11/12/2020

## 2020-11-13 ENCOUNTER — Other Ambulatory Visit: Payer: Self-pay | Admitting: Pulmonary Disease

## 2020-11-13 ENCOUNTER — Other Ambulatory Visit: Payer: Self-pay

## 2020-11-13 DIAGNOSIS — I779 Disorder of arteries and arterioles, unspecified: Secondary | ICD-10-CM

## 2020-12-05 ENCOUNTER — Telehealth: Payer: Self-pay | Admitting: Pulmonary Disease

## 2020-12-05 DIAGNOSIS — R9389 Abnormal findings on diagnostic imaging of other specified body structures: Secondary | ICD-10-CM

## 2020-12-05 NOTE — Telephone Encounter (Signed)
Called and spoke with Patient.  Patient is scheduled OV with Dr. Ander Slade 01/31/21, and was wanting to know if CT chest  is needed before OV.  Message routed to Dr. Ander Slade to advise  Last CT chest 11/07/19 for lung nodule, breast cancer history.  Per last OV note- 08/03/20- Dr. Ander Slade-   Plan/Recommendations:  Continue oxygen supplementation Continue bronchodilators  Graded exercise as tolerated  Repeat CT scan in about 6 months from now  Follow-up in 6 months   Sherrilyn Rist MD [facility] 08/03/2020, 12:16 PM  CC: Shon Baton, MD        Patient Instructions by Laurin Coder, MD at 08/03/2020 12:00 PM  Author: Laurin Coder, MD Author Type: Physician Filed: 08/03/2020 12:33 PM  Note Status: Signed Cosign: Cosign Not Required Encounter Date: 08/03/2020  Editor: Laurin Coder, MD (Physician)             Continue with oxygen around-the-clock  Graded exercise as tolerated  Continue bronchodilator treatments  I will see you back in 6 months  Call with significant concerns        Instructions  Continue with oxygen around-the-clock  Graded exercise as tolerated  Continue bronchodilator treatments  I will see you back in 6 months  Call with significant concerns

## 2020-12-06 NOTE — Telephone Encounter (Signed)
Order placed for the CT  I called and spoke with the pt and notified her that this was done  Nothing further needed

## 2020-12-06 NOTE — Telephone Encounter (Signed)
Yes please, if patient can get the CT before the visit

## 2021-01-02 ENCOUNTER — Other Ambulatory Visit: Payer: Self-pay | Admitting: Emergency Medicine

## 2021-01-02 MED ORDER — GABAPENTIN 100 MG PO CAPS: 100.0000 mg | ORAL_CAPSULE | Freq: Three times a day (TID) | ORAL | 3 refills | Status: DC | PRN

## 2021-01-02 MED ORDER — GABAPENTIN 300 MG PO CAPS: 600.0000 mg | ORAL_CAPSULE | Freq: Three times a day (TID) | ORAL | 3 refills | Status: DC

## 2021-01-08 ENCOUNTER — Ambulatory Visit
Admission: RE | Admit: 2021-01-08 | Discharge: 2021-01-08 | Disposition: A | Discharge status: Home / Self Care | Payer: Medicare Other | Source: Ambulatory Visit | Attending: Pulmonary Disease | Admitting: Pulmonary Disease

## 2021-01-08 DIAGNOSIS — R9389 Abnormal findings on diagnostic imaging of other specified body structures: Secondary | ICD-10-CM

## 2021-01-09 ENCOUNTER — Telehealth: Payer: Self-pay | Admitting: Pulmonary Disease

## 2021-01-09 NOTE — Telephone Encounter (Signed)
Spoke with patient  To keep appointment with me at end of the month   Schedule to see Dr Valeta Harms or [facility] for enlarging lung nodule- this was already discussed with the patient

## 2021-01-09 NOTE — Telephone Encounter (Signed)
Spoke with the pt and notified and scheduled appt with Dr Valeta Harms for 01/16/21.

## 2021-01-09 NOTE — Telephone Encounter (Signed)
Spoke with Beth Daniels at [facility] radiology Call report on chest ct dated 01/09/21  IMPRESSION: Several solid pulmonary nodules with 1 mm increase in size since 11/07/2019. Largest measures 10 mm. Consider one of the following in 3 months: (a) repeat chest CT, (b) follow-up PET-CT, or (c) tissue sampling. This recommendation follows the consensus statement: Guidelines for Management of Incidental Pulmonary Nodules Detected on CT Images: From the Fleischner Society 2017; Radiology 2017; 284:228-243.   Greater increase in size of 7 mm semi-solid nodule of the lingula. Adenocarcinoma is a consideration.   These results will be called to the ordering clinician or representative by the Radiologist Assistant, and communication documented in the PACS or [employer].     Electronically Signed   By: Macy Mis M.D.   On: 01/09/2021 09:10  Routing to Dr Ander Slade as urgent

## 2021-01-14 ENCOUNTER — Encounter (HOSPITAL_COMMUNITY): Payer: Medicare Other

## 2021-01-16 ENCOUNTER — Other Ambulatory Visit: Payer: Self-pay

## 2021-01-16 ENCOUNTER — Encounter: Payer: Self-pay | Admitting: Pulmonary Disease

## 2021-01-16 ENCOUNTER — Telehealth: Payer: Self-pay | Admitting: Pulmonary Disease

## 2021-01-16 ENCOUNTER — Ambulatory Visit (INDEPENDENT_AMBULATORY_CARE_PROVIDER_SITE_OTHER): Payer: Medicare Other | Admitting: Pulmonary Disease

## 2021-01-16 VITALS — BP 122/56 | HR 94 | Ht 62.0 in | Wt 93.6 lb

## 2021-01-16 DIAGNOSIS — J9611 Chronic respiratory failure with hypoxia: Secondary | ICD-10-CM | POA: Diagnosis not present

## 2021-01-16 DIAGNOSIS — R918 Other nonspecific abnormal finding of lung field: Secondary | ICD-10-CM | POA: Diagnosis not present

## 2021-01-16 DIAGNOSIS — R9389 Abnormal findings on diagnostic imaging of other specified body structures: Secondary | ICD-10-CM | POA: Diagnosis not present

## 2021-01-16 DIAGNOSIS — J449 Chronic obstructive pulmonary disease, unspecified: Secondary | ICD-10-CM

## 2021-01-16 NOTE — Patient Instructions (Signed)
Thank you for visiting Dr. Valeta Harms at [facility]. Today we recommend the following: Orders Placed This Encounter  Procedures   Procedural/ Surgical Case Request: VIDEO BRONCHOSCOPY WITH ENDOBRONCHIAL NAVIGATION   NM PET Image Initial (PI) Skull Base To Thigh   Ambulatory referral to Pulmonology   Tentative: 02/12/2021  Return in about 6 weeks (around 02/27/2021) for Dr. Valeta Harms.    Please do your part to reduce the spread of COVID-19.

## 2021-01-16 NOTE — Progress Notes (Signed)
Synopsis: Referred in July 2022 for lung nodule by Shon Baton, MD  Subjective:   PATIENT ID: Beth Daniels GENDER: female DOB: 04-May-1946, MRN: [id number]  Chief Complaint  Patient presents with   Lung Lesion    Shortness of breath on 2L. Fine when sitting, when moving around it gets worse.Inhalers working well.    This is a 75 year old female, past medical history of breast cancer on the right 9 years ago status post bilateral mastectomy, severe emphysema, chronic hypoxemic respiratory failure on 2 L nasal cannula.  Patient has had multiple CT images over the past 2+ years.  Has shown interval growth of at least 3 nodules.  She has a right upper lobe anterior nodule that has grown in size by at least 1 to 2 mm as well as a right lower lobe superior segment lesion that has grown.  And she has a left lung nodule that has grown from 4 mm to 7 mm.  From a respiratory standpoint she has been stable on her 2 L nasal cannula and compliant with her inhaler regimen.   Past Medical History:  Diagnosis Date   Alcohol abuse    Cancer ([place])    breast right   Chronic hyponatremia    COPD (chronic obstructive pulmonary disease) (HCC)    DCIS (ductal carcinoma in situ)    Emphysema of lung (HCC)    Osteoporosis    Overactive bladder    PVD (peripheral vascular disease) ([place])    Tobacco use      Family History  Problem Relation Age of Onset   Alzheimer's disease Mother    Pneumonia Father        aspiration pneumonia     Past Surgical History:  Procedure Laterality Date   ABDOMINAL AORTOGRAM W/LOWER EXTREMITY N/A 03/15/2019   Procedure: ABDOMINAL AORTOGRAM W/LOWER EXTREMITY;  Surgeon: Serafina Mitchell, MD;  Location: [facility];  Service: Cardiovascular;  Laterality: N/A;   aeptoplasty     cartaract extraction     INTRAMEDULLARY (IM) NAIL INTERTROCHANTERIC Left 01/30/2019   Procedure: INTRAMEDULLARY (IM) NAIL INTERTROCHANTRIC;  Surgeon: Frederik Pear, MD;  Location: [facility];  Service:  Orthopedics;  Laterality: Left;   IR FIBRIN GLUE REPAIR ANAL FISTULA     MASTECTOMY     s/p bl   PERIPHERAL VASCULAR INTERVENTION  03/15/2019   Procedure: PERIPHERAL VASCULAR INTERVENTION;  Surgeon: Serafina Mitchell, MD;  Location: [facility];  Service: Cardiovascular;;  Lt. SFA   PERIPHERAL VASCULAR THROMBECTOMY  03/15/2019   Procedure: PERIPHERAL VASCULAR THROMBECTOMY;  Surgeon: Serafina Mitchell, MD;  Location: [facility];  Service: Cardiovascular;;  Lt. AT, PT   RETINAL DETACHMENT SURGERY     sclerotherapy vein leg     vitreous retinal surgery      Social History   Socioeconomic History   Marital status: Married    Spouse name: Elenore Rota   Number of children: Not on file   Years of education: Not on file   Highest education level: Not on file  Occupational History   Occupation: RETIRED  Tobacco Use   Smoking status: Former    Packs/day: 1.50    Years: 50.00    Pack years: 75.00    Types: Cigarettes    Start date: 1970    Quit date: 10/06/2016    Years since quitting: 4.2   Smokeless tobacco: Never  Vaping Use   Vaping Use: Never used  Substance and Sexual Activity  Alcohol use: Yes    Alcohol/week: 28.0 standard drinks    Types: 28 Glasses of wine per week    Comment: 4 glasses wine a night   Drug use: Not Currently   Sexual activity: Not on file  Other Topics Concern   Not on file  Social History Narrative   Right handed,    Lives with husband   Drinks 2 cups caffeine daily   Social Determinants of Health   Financial Resource Strain: Not on file  Food Insecurity: Not on file  Transportation Needs: Not on file  Physical Activity: Not on file  Stress: Not on file  Social Connections: Not on file  Intimate Partner Violence: Not on file     No Known Allergies   Outpatient Medications Prior to Visit  Medication Sig Dispense Refill   albuterol (PROVENTIL) (2.5 MG/3ML) 0.083% nebulizer solution Take 3 mLs (2.5 mg total) by nebulization every 4  (four) hours as needed for wheezing or shortness of breath. 75 mL 3   ANORO ELLIPTA 62.5-25 MCG/INH AEPB USE 1 INHALATION DAILY 180 each 3   aspirin EC 81 MG tablet Take 81 mg by mouth daily.     azelastine (ASTELIN) 0.1 % nasal spray USE 1 SPRAY IN EACH NOSTRIL TWICE A DAY AS DIRECTED 30 mL 6   Azelastine-Fluticasone 137-50 MCG/ACT SUSP Place 1 spray into the nose daily. 23 g 6   cetirizine (ZYRTEC) 10 MG tablet cetirizine 10 mg tablet     conjugated estrogens (PREMARIN) vaginal cream Place 1 Applicatorful vaginally. Three times a week M/W/F     Cranberry 400 MG CAPS Take 400 mg by mouth daily at 2 PM.      denosumab (PROLIA) 60 MG/ML SOSY injection Inject 60 mg into the skin every 6 (six) months.      docusate sodium (COLACE) 100 MG capsule Take 1 capsule (100 mg total) by mouth 2 (two) times daily. (Patient taking differently: Take 100 mg by mouth at bedtime.) 10 capsule 0   Ferrous Fumarate (HEMOCYTE - 106 MG FE) 324 (106 Fe) MG TABS tablet Take 1 tablet by mouth daily at 3 pm.      fluticasone (FLONASE) 50 MCG/ACT nasal spray Place 2 sprays into both nostrils 2 (two) times daily.     folic acid (FOLVITE) 1 MG tablet Take 1 mg by mouth daily.     gabapentin (NEURONTIN) 100 MG capsule Take 1 capsule (100 mg total) by mouth 3 (three) times daily as needed. 270 capsule 3   gabapentin (NEURONTIN) 300 MG capsule Take 2 capsules (600 mg total) by mouth 3 (three) times daily. 540 capsule 3   gabapentin (NEURONTIN) 300 MG capsule Take 1 capsule by mouth 3 (three) times daily.     guaiFENesin (MUCINEX) 600 MG 12 hr tablet Take 600 mg by mouth 2 (two) times daily.     Multiple Vitamin (MULTI-VITAMIN) tablet Take 1 tablet by mouth daily.      mupirocin ointment (BACTROBAN) 2 % Apply 1 application topically daily as needed (wound care).      MYRBETRIQ 50 MG TB24 tablet Take 50 mg by mouth daily.     nicotine polacrilex (COMMIT) 4 MG lozenge Take 4 mg by mouth as needed for smoking cessation.      OXYGEN  2 liters at hs     pantoprazole (PROTONIX) 40 MG tablet Take 40 mg by mouth at bedtime.      PROAIR HFA 108 (90 Base) MCG/ACT inhaler USE 1 INHALATION  EVERY 6 HOURS AS NEEDED FOR WHEEZING OR SHORTNESS OF BREATH 17 g 3   sodium chloride (OCEAN) 0.65 % SOLN nasal spray Place 1 spray into both nostrils as needed for congestion.     Vitamin D, Cholecalciferol, 25 MCG (1000 UT) TABS Take 1,000 Units by mouth daily.     atorvastatin (LIPITOR) 20 MG tablet Take 20 mg by mouth at bedtime.      No facility-administered medications prior to visit.    Review of Systems  Constitutional:  Negative for chills, fever, malaise/fatigue and weight loss.  HENT:  Negative for hearing loss, sore throat and tinnitus.   Eyes:  Negative for blurred vision and double vision.  Respiratory:  Positive for shortness of breath. Negative for cough, hemoptysis, sputum production, wheezing and stridor.   Cardiovascular:  Negative for chest pain, palpitations, orthopnea, leg swelling and PND.  Gastrointestinal:  Negative for abdominal pain, constipation, diarrhea, heartburn, nausea and vomiting.  Genitourinary:  Negative for dysuria, hematuria and urgency.  Musculoskeletal:  Negative for joint pain and myalgias.  Skin:  Negative for itching and rash.  Neurological:  Negative for dizziness, tingling, weakness and headaches.  Endo/Heme/Allergies:  Negative for environmental allergies. Does not bruise/bleed easily.  Psychiatric/Behavioral:  Negative for depression. The patient is not nervous/anxious and does not have insomnia.   All other systems reviewed and are negative.   Objective:  Physical Exam Vitals reviewed.  Constitutional:      General: She is not in acute distress.    Appearance: She is well-developed.  HENT:     Head: Normocephalic and atraumatic.     Mouth/Throat:     Pharynx: No oropharyngeal exudate.  Eyes:     Conjunctiva/sclera: Conjunctivae normal.     Pupils: Pupils are equal, round, and  reactive to light.  Neck:     Vascular: No JVD.     Trachea: No tracheal deviation.     Comments: Loss of supraclavicular fat Cardiovascular:     Rate and Rhythm: Normal rate and regular rhythm.     Heart sounds: S1 normal and S2 normal.     Comments: Distant heart tones Pulmonary:     Effort: No tachypnea or accessory muscle usage.     Breath sounds: No stridor. Decreased breath sounds (throughout all lung fields) present. No wheezing, rhonchi or rales.  Abdominal:     General: Bowel sounds are normal. There is no distension.     Palpations: Abdomen is soft.     Tenderness: There is no abdominal tenderness.  Musculoskeletal:        General: Deformity (muscle wasting ) present.  Skin:    General: Skin is warm and dry.     Capillary Refill: Capillary refill takes less than 2 seconds.     Findings: No rash.  Neurological:     Mental Status: She is alert and oriented to person, place, and time.  Psychiatric:        Behavior: Behavior normal.     Vitals:   01/16/21 1409  BP: (!) 122/56  Pulse: 94  SpO2: 98%  Weight: 93 lb 9.6 oz (42.5 kg)  Height: 5\' 2"  (1.575 m)   98% on RA BMI Readings from Last 3 Encounters:  01/16/21 17.12 kg/m  11/12/20 17.56 kg/m  09/20/20 17.96 kg/m   Wt Readings from Last 3 Encounters:  01/16/21 93 lb 9.6 oz (42.5 kg)  11/12/20 96 lb (43.5 kg)  09/20/20 98 lb 3.2 oz (44.5 kg)     CBC  Component Value Date/Time   WBC 6.6 02/02/2019 0401   RBC 2.86 (L) 02/02/2019 0401   HGB 11.2 (L) 03/15/2019 0748   HCT 33.0 (L) 03/15/2019 0748   PLT 197 02/02/2019 0401   MCV 95.1 02/02/2019 0401   MCH 31.5 02/02/2019 0401   MCHC 33.1 02/02/2019 0401   RDW 18.1 (H) 02/02/2019 0401   LYMPHSABS 0.8 01/29/2019 1354   MONOABS 0.3 01/29/2019 1354   EOSABS 0.0 01/29/2019 1354   BASOSABS 0.0 01/29/2019 1354     Chest Imaging: 01/09/2021 CT chest: History of breast cancer, now has nodule surveillance with multiple small pulmonary nodules that have  increased in size.  Right lower lobe superior segment now 10 mm, right upper lobe 9 mm lesion as well as a lingular semisolid lesion that was 4 mm and now 7 years.  Several multiple small subsolid lesions and solid lesions that have large.  The suprasellar lesion is macrolobulated, the right upper lobe anterior lesion is smoother bordered.  We may be dealing with metachronous primary lung cancers due to the patient's history of smoking and severe emphysema. The patient's images have been independently reviewed by me.    Pulmonary Functions Testing Results: No flowsheet data found.  FeNO:   Pathology:   Echocardiogram:   Heart Catheterization:     Assessment & Plan:     ICD-10-CM   1. Multiple lung nodules on CT  R91.8 NM PET Image Initial (PI) Skull Base To Thigh    Procedural/ Surgical Case Request: [facility]    Ambulatory referral to Pulmonology    2. Chronic obstructive pulmonary disease, unspecified COPD type ([place])  J44.9     3. Abnormal CT of the chest  R93.89     4. Chronic hypoxemic respiratory failure (HCC)  J96.11       Discussion:  This is a 75 year old female, slowly enlarging multiple pulmonary nodules, right-sided lesions all remain subcentimeter however have shown growth.  She does have severe emphysema.  She not a candidate for surgical resection due to her severe lung disease and chronic oxygen dependence.  She also has multiple lesions that are of concern.  I think the next best steps would be including obtaining a PET scan.  Plan: We discussed the risk benefits and alternatives obtaining PET scan versus moving directly to her tissue sampling. I think the PET scan will help [place] target more important areas pending upon whether or not we are going to consider biopsying both sides of the lung. I think at least we should consider tissue sampling the right upper lobe lesion as well as the posterior segment right lower lobe lesion.   Both of these are now at approximately a centimeter in size and had shown history of slow growth. Patient is agreeable to this plan. We will tentatively schedule for robotic assisted bronchoscopy on 02/12/2021. Due to the patient's emphysema she is at moderate risk for complication of anesthesia.  Additionally at risk for pneumothorax development for procedure.  She understands this.  PET scan has been ordered we will also attempt to get CT converted to super D or order a super D formatted CT.    Current Outpatient Medications:    albuterol (PROVENTIL) (2.5 MG/3ML) 0.083% nebulizer solution, Take 3 mLs (2.5 mg total) by nebulization every 4 (four) hours as needed for wheezing or shortness of breath., Disp: 75 mL, Rfl: 3   ANORO ELLIPTA 62.5-25 MCG/INH AEPB, USE 1 INHALATION DAILY, Disp: 180 each, Rfl: 3  aspirin EC 81 MG tablet, Take 81 mg by mouth daily., Disp: , Rfl:    azelastine (ASTELIN) 0.1 % nasal spray, USE 1 SPRAY IN EACH NOSTRIL TWICE A DAY AS DIRECTED, Disp: 30 mL, Rfl: 6   Azelastine-Fluticasone 137-50 MCG/ACT SUSP, Place 1 spray into the nose daily., Disp: 23 g, Rfl: 6   cetirizine (ZYRTEC) 10 MG tablet, cetirizine 10 mg tablet, Disp: , Rfl:    conjugated estrogens (PREMARIN) vaginal cream, Place 1 Applicatorful vaginally. Three times a week M/W/F, Disp: , Rfl:    Cranberry 400 MG CAPS, Take 400 mg by mouth daily at 2 PM. , Disp: , Rfl:    denosumab (PROLIA) 60 MG/ML SOSY injection, Inject 60 mg into the skin every 6 (six) months. , Disp: , Rfl:    docusate sodium (COLACE) 100 MG capsule, Take 1 capsule (100 mg total) by mouth 2 (two) times daily. (Patient taking differently: Take 100 mg by mouth at bedtime.), Disp: 10 capsule, Rfl: 0   Ferrous Fumarate (HEMOCYTE - 106 MG FE) 324 (106 Fe) MG TABS tablet, Take 1 tablet by mouth daily at 3 pm. , Disp: , Rfl:    fluticasone (FLONASE) 50 MCG/ACT nasal spray, Place 2 sprays into both nostrils 2 (two) times daily., Disp: , Rfl:    folic  acid (FOLVITE) 1 MG tablet, Take 1 mg by mouth daily., Disp: , Rfl:    gabapentin (NEURONTIN) 100 MG capsule, Take 1 capsule (100 mg total) by mouth 3 (three) times daily as needed., Disp: 270 capsule, Rfl: 3   gabapentin (NEURONTIN) 300 MG capsule, Take 2 capsules (600 mg total) by mouth 3 (three) times daily., Disp: 540 capsule, Rfl: 3   gabapentin (NEURONTIN) 300 MG capsule, Take 1 capsule by mouth 3 (three) times daily., Disp: , Rfl:    guaiFENesin (MUCINEX) 600 MG 12 hr tablet, Take 600 mg by mouth 2 (two) times daily., Disp: , Rfl:    Multiple Vitamin (MULTI-VITAMIN) tablet, Take 1 tablet by mouth daily. , Disp: , Rfl:    mupirocin ointment (BACTROBAN) 2 %, Apply 1 application topically daily as needed (wound care). , Disp: , Rfl:    MYRBETRIQ 50 MG TB24 tablet, Take 50 mg by mouth daily., Disp: , Rfl:    nicotine polacrilex (COMMIT) 4 MG lozenge, Take 4 mg by mouth as needed for smoking cessation. , Disp: , Rfl:    OXYGEN, 2 liters at hs, Disp: , Rfl:    pantoprazole (PROTONIX) 40 MG tablet, Take 40 mg by mouth at bedtime. , Disp: , Rfl:    PROAIR HFA 108 (90 Base) MCG/ACT inhaler, USE 1 INHALATION EVERY 6 HOURS AS NEEDED FOR WHEEZING OR SHORTNESS OF BREATH, Disp: 17 g, Rfl: 3   sodium chloride (OCEAN) 0.65 % SOLN nasal spray, Place 1 spray into both nostrils as needed for congestion., Disp: , Rfl:    Vitamin D, Cholecalciferol, 25 MCG (1000 UT) TABS, Take 1,000 Units by mouth daily., Disp: , Rfl:    atorvastatin (LIPITOR) 20 MG tablet, Take 20 mg by mouth at bedtime. , Disp: , Rfl:   I spent 63 minutes dedicated to the care of this patient on the date of this encounter to include pre-visit review of records, face-to-face time with the patient discussing conditions above, post visit ordering of testing, clinical documentation with the electronic health record, making appropriate referrals as documented, and communicating necessary findings to members of the patients care team.   Garner Nash, DO [facility] 01/16/2021 2:14 PM

## 2021-01-16 NOTE — H&P (View-Only) (Signed)
Synopsis: Referred in July 2022 for lung nodule by Shon Baton, MD  Subjective:   PATIENT ID: Beth Daniels GENDER: female DOB: 11-Sep-1945, MRN: [id number]  Chief Complaint  Patient presents with   Lung Lesion    Shortness of breath on 2L. Fine when sitting, when moving around it gets worse.Inhalers working well.    This is a 75 year old female, past medical history of breast cancer on the right 9 years ago status post bilateral mastectomy, severe emphysema, chronic hypoxemic respiratory failure on 2 L nasal cannula.  Patient has had multiple CT images over the past 2+ years.  Has shown interval growth of at least 3 nodules.  She has a right upper lobe anterior nodule that has grown in size by at least 1 to 2 mm as well as a right lower lobe superior segment lesion that has grown.  And she has a left lung nodule that has grown from 4 mm to 7 mm.  From a respiratory standpoint she has been stable on her 2 L nasal cannula and compliant with her inhaler regimen.   Past Medical History:  Diagnosis Date   Alcohol abuse    Cancer ([place])    breast right   Chronic hyponatremia    COPD (chronic obstructive pulmonary disease) (HCC)    DCIS (ductal carcinoma in situ)    Emphysema of lung (HCC)    Osteoporosis    Overactive bladder    PVD (peripheral vascular disease) ([place])    Tobacco use      Family History  Problem Relation Age of Onset   Alzheimer's disease Mother    Pneumonia Father        aspiration pneumonia     Past Surgical History:  Procedure Laterality Date   ABDOMINAL AORTOGRAM W/LOWER EXTREMITY N/A 03/15/2019   Procedure: ABDOMINAL AORTOGRAM W/LOWER EXTREMITY;  Surgeon: Serafina Mitchell, MD;  Location: [facility];  Service: Cardiovascular;  Laterality: N/A;   aeptoplasty     cartaract extraction     INTRAMEDULLARY (IM) NAIL INTERTROCHANTERIC Left 01/30/2019   Procedure: INTRAMEDULLARY (IM) NAIL INTERTROCHANTRIC;  Surgeon: Frederik Pear, MD;  Location: [facility];  Service:  Orthopedics;  Laterality: Left;   IR FIBRIN GLUE REPAIR ANAL FISTULA     MASTECTOMY     s/p bl   PERIPHERAL VASCULAR INTERVENTION  03/15/2019   Procedure: PERIPHERAL VASCULAR INTERVENTION;  Surgeon: Serafina Mitchell, MD;  Location: [facility];  Service: Cardiovascular;;  Lt. SFA   PERIPHERAL VASCULAR THROMBECTOMY  03/15/2019   Procedure: PERIPHERAL VASCULAR THROMBECTOMY;  Surgeon: Serafina Mitchell, MD;  Location: [facility];  Service: Cardiovascular;;  Lt. AT, PT   RETINAL DETACHMENT SURGERY     sclerotherapy vein leg     vitreous retinal surgery      Social History   Socioeconomic History   Marital status: Married    Spouse name: Elenore Rota   Number of children: Not on file   Years of education: Not on file   Highest education level: Not on file  Occupational History   Occupation: RETIRED  Tobacco Use   Smoking status: Former    Packs/day: 1.50    Years: 50.00    Pack years: 75.00    Types: Cigarettes    Start date: 1970    Quit date: 10/06/2016    Years since quitting: 4.2   Smokeless tobacco: Never  Vaping Use   Vaping Use: Never used  Substance and Sexual Activity  Alcohol use: Yes    Alcohol/week: 28.0 standard drinks    Types: 28 Glasses of wine per week    Comment: 4 glasses wine a night   Drug use: Not Currently   Sexual activity: Not on file  Other Topics Concern   Not on file  Social History Narrative   Right handed,    Lives with husband   Drinks 2 cups caffeine daily   Social Determinants of Health   Financial Resource Strain: Not on file  Food Insecurity: Not on file  Transportation Needs: Not on file  Physical Activity: Not on file  Stress: Not on file  Social Connections: Not on file  Intimate Partner Violence: Not on file     No Known Allergies   Outpatient Medications Prior to Visit  Medication Sig Dispense Refill   albuterol (PROVENTIL) (2.5 MG/3ML) 0.083% nebulizer solution Take 3 mLs (2.5 mg total) by nebulization every 4  (four) hours as needed for wheezing or shortness of breath. 75 mL 3   ANORO ELLIPTA 62.5-25 MCG/INH AEPB USE 1 INHALATION DAILY 180 each 3   aspirin EC 81 MG tablet Take 81 mg by mouth daily.     azelastine (ASTELIN) 0.1 % nasal spray USE 1 SPRAY IN EACH NOSTRIL TWICE A DAY AS DIRECTED 30 mL 6   Azelastine-Fluticasone 137-50 MCG/ACT SUSP Place 1 spray into the nose daily. 23 g 6   cetirizine (ZYRTEC) 10 MG tablet cetirizine 10 mg tablet     conjugated estrogens (PREMARIN) vaginal cream Place 1 Applicatorful vaginally. Three times a week M/W/F     Cranberry 400 MG CAPS Take 400 mg by mouth daily at 2 PM.      denosumab (PROLIA) 60 MG/ML SOSY injection Inject 60 mg into the skin every 6 (six) months.      docusate sodium (COLACE) 100 MG capsule Take 1 capsule (100 mg total) by mouth 2 (two) times daily. (Patient taking differently: Take 100 mg by mouth at bedtime.) 10 capsule 0   Ferrous Fumarate (HEMOCYTE - 106 MG FE) 324 (106 Fe) MG TABS tablet Take 1 tablet by mouth daily at 3 pm.      fluticasone (FLONASE) 50 MCG/ACT nasal spray Place 2 sprays into both nostrils 2 (two) times daily.     folic acid (FOLVITE) 1 MG tablet Take 1 mg by mouth daily.     gabapentin (NEURONTIN) 100 MG capsule Take 1 capsule (100 mg total) by mouth 3 (three) times daily as needed. 270 capsule 3   gabapentin (NEURONTIN) 300 MG capsule Take 2 capsules (600 mg total) by mouth 3 (three) times daily. 540 capsule 3   gabapentin (NEURONTIN) 300 MG capsule Take 1 capsule by mouth 3 (three) times daily.     guaiFENesin (MUCINEX) 600 MG 12 hr tablet Take 600 mg by mouth 2 (two) times daily.     Multiple Vitamin (MULTI-VITAMIN) tablet Take 1 tablet by mouth daily.      mupirocin ointment (BACTROBAN) 2 % Apply 1 application topically daily as needed (wound care).      MYRBETRIQ 50 MG TB24 tablet Take 50 mg by mouth daily.     nicotine polacrilex (COMMIT) 4 MG lozenge Take 4 mg by mouth as needed for smoking cessation.      OXYGEN  2 liters at hs     pantoprazole (PROTONIX) 40 MG tablet Take 40 mg by mouth at bedtime.      PROAIR HFA 108 (90 Base) MCG/ACT inhaler USE 1 INHALATION  EVERY 6 HOURS AS NEEDED FOR WHEEZING OR SHORTNESS OF BREATH 17 g 3   sodium chloride (OCEAN) 0.65 % SOLN nasal spray Place 1 spray into both nostrils as needed for congestion.     Vitamin D, Cholecalciferol, 25 MCG (1000 UT) TABS Take 1,000 Units by mouth daily.     atorvastatin (LIPITOR) 20 MG tablet Take 20 mg by mouth at bedtime.      No facility-administered medications prior to visit.    Review of Systems  Constitutional:  Negative for chills, fever, malaise/fatigue and weight loss.  HENT:  Negative for hearing loss, sore throat and tinnitus.   Eyes:  Negative for blurred vision and double vision.  Respiratory:  Positive for shortness of breath. Negative for cough, hemoptysis, sputum production, wheezing and stridor.   Cardiovascular:  Negative for chest pain, palpitations, orthopnea, leg swelling and PND.  Gastrointestinal:  Negative for abdominal pain, constipation, diarrhea, heartburn, nausea and vomiting.  Genitourinary:  Negative for dysuria, hematuria and urgency.  Musculoskeletal:  Negative for joint pain and myalgias.  Skin:  Negative for itching and rash.  Neurological:  Negative for dizziness, tingling, weakness and headaches.  Endo/Heme/Allergies:  Negative for environmental allergies. Does not bruise/bleed easily.  Psychiatric/Behavioral:  Negative for depression. The patient is not nervous/anxious and does not have insomnia.   All other systems reviewed and are negative.   Objective:  Physical Exam Vitals reviewed.  Constitutional:      General: She is not in acute distress.    Appearance: She is well-developed.  HENT:     Head: Normocephalic and atraumatic.     Mouth/Throat:     Pharynx: No oropharyngeal exudate.  Eyes:     Conjunctiva/sclera: Conjunctivae normal.     Pupils: Pupils are equal, round, and  reactive to light.  Neck:     Vascular: No JVD.     Trachea: No tracheal deviation.     Comments: Loss of supraclavicular fat Cardiovascular:     Rate and Rhythm: Normal rate and regular rhythm.     Heart sounds: S1 normal and S2 normal.     Comments: Distant heart tones Pulmonary:     Effort: No tachypnea or accessory muscle usage.     Breath sounds: No stridor. Decreased breath sounds (throughout all lung fields) present. No wheezing, rhonchi or rales.  Abdominal:     General: Bowel sounds are normal. There is no distension.     Palpations: Abdomen is soft.     Tenderness: There is no abdominal tenderness.  Musculoskeletal:        General: Deformity (muscle wasting ) present.  Skin:    General: Skin is warm and dry.     Capillary Refill: Capillary refill takes less than 2 seconds.     Findings: No rash.  Neurological:     Mental Status: She is alert and oriented to person, place, and time.  Psychiatric:        Behavior: Behavior normal.     Vitals:   01/16/21 1409  BP: (!) 122/56  Pulse: 94  SpO2: 98%  Weight: 93 lb 9.6 oz (42.5 kg)  Height: 5\' 2"  (1.575 m)   98% on RA BMI Readings from Last 3 Encounters:  01/16/21 17.12 kg/m  11/12/20 17.56 kg/m  09/20/20 17.96 kg/m   Wt Readings from Last 3 Encounters:  01/16/21 93 lb 9.6 oz (42.5 kg)  11/12/20 96 lb (43.5 kg)  09/20/20 98 lb 3.2 oz (44.5 kg)     CBC  Component Value Date/Time   WBC 6.6 02/02/2019 0401   RBC 2.86 (L) 02/02/2019 0401   HGB 11.2 (L) 03/15/2019 0748   HCT 33.0 (L) 03/15/2019 0748   PLT 197 02/02/2019 0401   MCV 95.1 02/02/2019 0401   MCH 31.5 02/02/2019 0401   MCHC 33.1 02/02/2019 0401   RDW 18.1 (H) 02/02/2019 0401   LYMPHSABS 0.8 01/29/2019 1354   MONOABS 0.3 01/29/2019 1354   EOSABS 0.0 01/29/2019 1354   BASOSABS 0.0 01/29/2019 1354     Chest Imaging: 01/09/2021 CT chest: History of breast cancer, now has nodule surveillance with multiple small pulmonary nodules that have  increased in size.  Right lower lobe superior segment now 10 mm, right upper lobe 9 mm lesion as well as a lingular semisolid lesion that was 4 mm and now 7 years.  Several multiple small subsolid lesions and solid lesions that have large.  The suprasellar lesion is macrolobulated, the right upper lobe anterior lesion is smoother bordered.  We may be dealing with metachronous primary lung cancers due to the patient's history of smoking and severe emphysema. The patient's images have been independently reviewed by me.    Pulmonary Functions Testing Results: No flowsheet data found.  FeNO:   Pathology:   Echocardiogram:   Heart Catheterization:     Assessment & Plan:     ICD-10-CM   1. Multiple lung nodules on CT  R91.8 NM PET Image Initial (PI) Skull Base To Thigh    Procedural/ Surgical Case Request: [place]    Ambulatory referral to Pulmonology    2. Chronic obstructive pulmonary disease, unspecified COPD type ([place])  J44.9     3. Abnormal CT of the chest  R93.89     4. Chronic hypoxemic respiratory failure (HCC)  J96.11       Discussion:  This is a 75 year old female, slowly enlarging multiple pulmonary nodules, right-sided lesions all remain subcentimeter however have shown growth.  She does have severe emphysema.  She not a candidate for surgical resection due to her severe lung disease and chronic oxygen dependence.  She also has multiple lesions that are of concern.  I think the next best steps would be including obtaining a PET scan.  Plan: We discussed the risk benefits and alternatives obtaining PET scan versus moving directly to her tissue sampling. I think the PET scan will help [place] target more important areas pending upon whether or not we are going to consider biopsying both sides of the lung. I think at least we should consider tissue sampling the right upper lobe lesion as well as the posterior segment right lower lobe lesion.   Both of these are now at approximately a centimeter in size and had shown history of slow growth. Patient is agreeable to this plan. We will tentatively schedule for robotic assisted bronchoscopy on 02/12/2021. Due to the patient's emphysema she is at moderate risk for complication of anesthesia.  Additionally at risk for pneumothorax development for procedure.  She understands this.  PET scan has been ordered we will also attempt to get CT converted to super D or order a super D formatted CT.    Current Outpatient Medications:    albuterol (PROVENTIL) (2.5 MG/3ML) 0.083% nebulizer solution, Take 3 mLs (2.5 mg total) by nebulization every 4 (four) hours as needed for wheezing or shortness of breath., Disp: 75 mL, Rfl: 3   ANORO ELLIPTA 62.5-25 MCG/INH AEPB, USE 1 INHALATION DAILY, Disp: 180 each, Rfl: 3  aspirin EC 81 MG tablet, Take 81 mg by mouth daily., Disp: , Rfl:    azelastine (ASTELIN) 0.1 % nasal spray, USE 1 SPRAY IN EACH NOSTRIL TWICE A DAY AS DIRECTED, Disp: 30 mL, Rfl: 6   Azelastine-Fluticasone 137-50 MCG/ACT SUSP, Place 1 spray into the nose daily., Disp: 23 g, Rfl: 6   cetirizine (ZYRTEC) 10 MG tablet, cetirizine 10 mg tablet, Disp: , Rfl:    conjugated estrogens (PREMARIN) vaginal cream, Place 1 Applicatorful vaginally. Three times a week M/W/F, Disp: , Rfl:    Cranberry 400 MG CAPS, Take 400 mg by mouth daily at 2 PM. , Disp: , Rfl:    denosumab (PROLIA) 60 MG/ML SOSY injection, Inject 60 mg into the skin every 6 (six) months. , Disp: , Rfl:    docusate sodium (COLACE) 100 MG capsule, Take 1 capsule (100 mg total) by mouth 2 (two) times daily. (Patient taking differently: Take 100 mg by mouth at bedtime.), Disp: 10 capsule, Rfl: 0   Ferrous Fumarate (HEMOCYTE - 106 MG FE) 324 (106 Fe) MG TABS tablet, Take 1 tablet by mouth daily at 3 pm. , Disp: , Rfl:    fluticasone (FLONASE) 50 MCG/ACT nasal spray, Place 2 sprays into both nostrils 2 (two) times daily., Disp: , Rfl:    folic  acid (FOLVITE) 1 MG tablet, Take 1 mg by mouth daily., Disp: , Rfl:    gabapentin (NEURONTIN) 100 MG capsule, Take 1 capsule (100 mg total) by mouth 3 (three) times daily as needed., Disp: 270 capsule, Rfl: 3   gabapentin (NEURONTIN) 300 MG capsule, Take 2 capsules (600 mg total) by mouth 3 (three) times daily., Disp: 540 capsule, Rfl: 3   gabapentin (NEURONTIN) 300 MG capsule, Take 1 capsule by mouth 3 (three) times daily., Disp: , Rfl:    guaiFENesin (MUCINEX) 600 MG 12 hr tablet, Take 600 mg by mouth 2 (two) times daily., Disp: , Rfl:    Multiple Vitamin (MULTI-VITAMIN) tablet, Take 1 tablet by mouth daily. , Disp: , Rfl:    mupirocin ointment (BACTROBAN) 2 %, Apply 1 application topically daily as needed (wound care). , Disp: , Rfl:    MYRBETRIQ 50 MG TB24 tablet, Take 50 mg by mouth daily., Disp: , Rfl:    nicotine polacrilex (COMMIT) 4 MG lozenge, Take 4 mg by mouth as needed for smoking cessation. , Disp: , Rfl:    OXYGEN, 2 liters at hs, Disp: , Rfl:    pantoprazole (PROTONIX) 40 MG tablet, Take 40 mg by mouth at bedtime. , Disp: , Rfl:    PROAIR HFA 108 (90 Base) MCG/ACT inhaler, USE 1 INHALATION EVERY 6 HOURS AS NEEDED FOR WHEEZING OR SHORTNESS OF BREATH, Disp: 17 g, Rfl: 3   sodium chloride (OCEAN) 0.65 % SOLN nasal spray, Place 1 spray into both nostrils as needed for congestion., Disp: , Rfl:    Vitamin D, Cholecalciferol, 25 MCG (1000 UT) TABS, Take 1,000 Units by mouth daily., Disp: , Rfl:    atorvastatin (LIPITOR) 20 MG tablet, Take 20 mg by mouth at bedtime. , Disp: , Rfl:   I spent 63 minutes dedicated to the care of this patient on the date of this encounter to include pre-visit review of records, face-to-face time with the patient discussing conditions above, post visit ordering of testing, clinical documentation with the electronic health record, making appropriate referrals as documented, and communicating necessary findings to members of the patients care team.   Garner Nash, DO [facility] 01/16/2021 2:14 PM

## 2021-01-16 NOTE — Telephone Encounter (Signed)
ENB has been scheduled at [place] on 8/9 at 7:30.  Spoke to Avril at GI and she is going to have CT from 7/5 converted to super D and will have disc sent to me so I can send to [facility].  Pt will go on 8/5 between 8:30-12:30 to get her covid test.  Appt info given to pt.

## 2021-01-17 NOTE — Telephone Encounter (Signed)
See other phone note from 7/6. Pt was seen by Dr. Valeta Harms on 7/13 and scheduled for a bronch in 02/2021. Will close encounter.

## 2021-01-18 ENCOUNTER — Other Ambulatory Visit (HOSPITAL_COMMUNITY): Payer: Self-pay

## 2021-01-21 ENCOUNTER — Other Ambulatory Visit: Payer: Self-pay

## 2021-01-21 ENCOUNTER — Ambulatory Visit (HOSPITAL_COMMUNITY)
Admission: RE | Admit: 2021-01-21 | Discharge: 2021-01-21 | Disposition: A | Discharge status: Home / Self Care | Payer: Medicare Other | Source: Ambulatory Visit | Attending: Internal Medicine | Admitting: Internal Medicine

## 2021-01-21 DIAGNOSIS — M810 Age-related osteoporosis without current pathological fracture: Secondary | ICD-10-CM | POA: Insufficient documentation

## 2021-01-21 MED ORDER — DENOSUMAB 60 MG/ML ~~LOC~~ SOSY
PREFILLED_SYRINGE | SUBCUTANEOUS | Status: AC
  Administered 2021-01-21: 60 mg via SUBCUTANEOUS
  Filled 2021-01-21: qty 1

## 2021-01-21 MED ORDER — DENOSUMAB 60 MG/ML ~~LOC~~ SOSY: 60.0000 mg | PREFILLED_SYRINGE | Freq: Once | SUBCUTANEOUS | Status: DC

## 2021-01-21 NOTE — Telephone Encounter (Signed)
Disc received this afternoon from GI and Tasia put in interoffice envelope with another disc to send to [facility].

## 2021-01-25 ENCOUNTER — Other Ambulatory Visit: Payer: Self-pay | Admitting: Pulmonary Disease

## 2021-01-25 MED ORDER — FLUTICASONE PROPIONATE 50 MCG/ACT NA SUSP: 2.0000 | Freq: Two times a day (BID) | NASAL | 5 refills | Status: DC

## 2021-01-31 ENCOUNTER — Ambulatory Visit: Payer: Medicare Other | Admitting: Pulmonary Disease

## 2021-02-01 NOTE — Telephone Encounter (Signed)
Dr. Valeta Harms, please see mychart message sent by pt and advise.

## 2021-02-04 ENCOUNTER — Other Ambulatory Visit: Payer: Self-pay

## 2021-02-04 ENCOUNTER — Ambulatory Visit (HOSPITAL_COMMUNITY)
Admission: RE | Admit: 2021-02-04 | Discharge: 2021-02-04 | Disposition: A | Discharge status: Home / Self Care | Payer: Medicare Other | Source: Ambulatory Visit | Attending: Pulmonary Disease | Admitting: Pulmonary Disease

## 2021-02-04 DIAGNOSIS — I700 Atherosclerosis of aorta: Secondary | ICD-10-CM | POA: Insufficient documentation

## 2021-02-04 DIAGNOSIS — R918 Other nonspecific abnormal finding of lung field: Secondary | ICD-10-CM | POA: Diagnosis not present

## 2021-02-04 DIAGNOSIS — J439 Emphysema, unspecified: Secondary | ICD-10-CM | POA: Diagnosis not present

## 2021-02-04 LAB — GLUCOSE, CAPILLARY: Glucose-Capillary: 107 mg/dL — ABNORMAL HIGH (ref 70–99)

## 2021-02-04 MED ORDER — FLUDEOXYGLUCOSE F - 18 (FDG) INJECTION
5.5000 | Freq: Once | INTRAVENOUS | Status: AC | PRN
  Administered 2021-02-04: 4.9 via INTRAVENOUS

## 2021-02-05 ENCOUNTER — Ambulatory Visit
Admission: RE | Admit: 2021-02-05 | Discharge: 2021-02-05 | Disposition: A | Discharge status: Home / Self Care | Payer: Medicare Other | Source: Ambulatory Visit | Attending: Pulmonary Disease | Admitting: Pulmonary Disease

## 2021-02-05 DIAGNOSIS — R9389 Abnormal findings on diagnostic imaging of other specified body structures: Secondary | ICD-10-CM

## 2021-02-05 DIAGNOSIS — J9611 Chronic respiratory failure with hypoxia: Secondary | ICD-10-CM

## 2021-02-05 DIAGNOSIS — R918 Other nonspecific abnormal finding of lung field: Secondary | ICD-10-CM

## 2021-02-05 NOTE — Telephone Encounter (Signed)
Dr. Valeta Harms, please see mychart message sent by pt and advise.

## 2021-02-08 ENCOUNTER — Other Ambulatory Visit: Payer: Self-pay | Admitting: Pulmonary Disease

## 2021-02-08 ENCOUNTER — Encounter (HOSPITAL_COMMUNITY): Payer: Self-pay | Admitting: Pulmonary Disease

## 2021-02-08 ENCOUNTER — Telehealth: Payer: Self-pay | Admitting: Pulmonary Disease

## 2021-02-08 NOTE — Progress Notes (Signed)
SDW CALL  Patient was given pre-op instructions over the phone. The opportunity was given for the patient to ask questions. No further questions asked. Patient verbalized understanding of instructions given.   PCP - DR. Shon Baton Cardiologist - Denies  Chest x-ray -  EKG -  Stress Test - Denies ECHO - Denies Cardiac Cath - Denies  Sleep Study - Denies  DM - Denies  Aspirin Instructions: Can continue aspirin per Dr. Valeta Harms  Anesthesia review: No  Patient denies shortness of breath, fever, cough and chest pain over the phone call   All instructions explained to the patient, with a verbal understanding of the material. Patient agrees to go over the instructions while at home for a better understanding. The opportunity to ask questions was provided.

## 2021-02-08 NOTE — Telephone Encounter (Signed)
Forwarding to Dr Valeta Harms for preadmission orders to be placed, thanks

## 2021-02-09 LAB — SARS CORONAVIRUS 2 (TAT 6-24 HRS): SARS Coronavirus 2: NEGATIVE

## 2021-02-12 ENCOUNTER — Ambulatory Visit (HOSPITAL_COMMUNITY): Payer: Medicare Other

## 2021-02-12 ENCOUNTER — Encounter (HOSPITAL_COMMUNITY): Payer: Self-pay | Admitting: Pulmonary Disease

## 2021-02-12 ENCOUNTER — Other Ambulatory Visit: Payer: Self-pay

## 2021-02-12 ENCOUNTER — Ambulatory Visit (HOSPITAL_COMMUNITY): Payer: Medicare Other | Admitting: Certified Registered Nurse Anesthetist

## 2021-02-12 ENCOUNTER — Ambulatory Visit (HOSPITAL_COMMUNITY)
Admission: RE | Admit: 2021-02-12 | Discharge: 2021-02-12 | Disposition: A | Discharge status: Home / Self Care | Payer: Medicare Other | Source: Ambulatory Visit | Attending: Pulmonary Disease | Admitting: Pulmonary Disease

## 2021-02-12 ENCOUNTER — Encounter (HOSPITAL_COMMUNITY)
Admission: RE | Disposition: A | Discharge status: Home / Self Care | Payer: Self-pay | Source: Ambulatory Visit | Attending: Pulmonary Disease

## 2021-02-12 DIAGNOSIS — J9611 Chronic respiratory failure with hypoxia: Secondary | ICD-10-CM | POA: Insufficient documentation

## 2021-02-12 DIAGNOSIS — Z853 Personal history of malignant neoplasm of breast: Secondary | ICD-10-CM | POA: Diagnosis not present

## 2021-02-12 DIAGNOSIS — Z7982 Long term (current) use of aspirin: Secondary | ICD-10-CM | POA: Diagnosis not present

## 2021-02-12 DIAGNOSIS — Z419 Encounter for procedure for purposes other than remedying health state, unspecified: Secondary | ICD-10-CM

## 2021-02-12 DIAGNOSIS — Z79899 Other long term (current) drug therapy: Secondary | ICD-10-CM | POA: Diagnosis not present

## 2021-02-12 DIAGNOSIS — Z7951 Long term (current) use of inhaled steroids: Secondary | ICD-10-CM | POA: Diagnosis not present

## 2021-02-12 DIAGNOSIS — Z87891 Personal history of nicotine dependence: Secondary | ICD-10-CM | POA: Diagnosis not present

## 2021-02-12 DIAGNOSIS — R918 Other nonspecific abnormal finding of lung field: Secondary | ICD-10-CM | POA: Diagnosis present

## 2021-02-12 DIAGNOSIS — Z9981 Dependence on supplemental oxygen: Secondary | ICD-10-CM | POA: Insufficient documentation

## 2021-02-12 DIAGNOSIS — C3431 Malignant neoplasm of lower lobe, right bronchus or lung: Secondary | ICD-10-CM | POA: Diagnosis present

## 2021-02-12 DIAGNOSIS — J439 Emphysema, unspecified: Secondary | ICD-10-CM | POA: Diagnosis not present

## 2021-02-12 DIAGNOSIS — Z9013 Acquired absence of bilateral breasts and nipples: Secondary | ICD-10-CM | POA: Diagnosis not present

## 2021-02-12 DIAGNOSIS — Z9889 Other specified postprocedural states: Secondary | ICD-10-CM

## 2021-02-12 HISTORY — DX: Anemia, unspecified: D64.9

## 2021-02-12 HISTORY — PX: BRONCHIAL BRUSHINGS: SHX5108

## 2021-02-12 HISTORY — PX: VIDEO BRONCHOSCOPY WITH ENDOBRONCHIAL NAVIGATION: SHX6175

## 2021-02-12 HISTORY — PX: BRONCHIAL NEEDLE ASPIRATION BIOPSY: SHX5106

## 2021-02-12 HISTORY — PX: VIDEO BRONCHOSCOPY WITH RADIAL ENDOBRONCHIAL ULTRASOUND: SHX6849

## 2021-02-12 HISTORY — PX: FIDUCIAL MARKER PLACEMENT: SHX6858

## 2021-02-12 HISTORY — DX: Dyspnea, unspecified: R06.00

## 2021-02-12 LAB — CBC
HCT: 36.4 % (ref 36.0–46.0)
Hemoglobin: 12 g/dL (ref 12.0–15.0)
MCH: 34 pg (ref 26.0–34.0)
MCHC: 33 g/dL (ref 30.0–36.0)
MCV: 103.1 fL — ABNORMAL HIGH (ref 80.0–100.0)
Platelets: 201 10*3/uL (ref 150–400)
RBC: 3.53 MIL/uL — ABNORMAL LOW (ref 3.87–5.11)
RDW: 12.9 % (ref 11.5–15.5)
WBC: 3.8 10*3/uL — ABNORMAL LOW (ref 4.0–10.5)
nRBC: 0 % (ref 0.0–0.2)

## 2021-02-12 LAB — COMPREHENSIVE METABOLIC PANEL
ALT: 17 U/L (ref 0–44)
AST: 29 U/L (ref 15–41)
Albumin: 3.7 g/dL (ref 3.5–5.0)
Alkaline Phosphatase: 75 U/L (ref 38–126)
Anion gap: 7 (ref 5–15)
BUN: <5 mg/dL — ABNORMAL LOW (ref 8–23)
CO2: 33 mmol/L — ABNORMAL HIGH (ref 22–32)
Calcium: 8.8 mg/dL — ABNORMAL LOW (ref 8.9–10.3)
Chloride: 94 mmol/L — ABNORMAL LOW (ref 98–111)
Creatinine, Ser: 0.45 mg/dL (ref 0.44–1.00)
GFR, Estimated: >60 mL/min (ref >60)
Glucose, Bld: 100 mg/dL — ABNORMAL HIGH (ref 70–99)
Potassium: 3.7 mmol/L (ref 3.5–5.1)
Sodium: 134 mmol/L — ABNORMAL LOW (ref 135–145)
Total Bilirubin: 0.8 mg/dL (ref 0.3–1.2)
Total Protein: 7.2 g/dL (ref 6.5–8.1)

## 2021-02-12 SURGERY — VIDEO BRONCHOSCOPY WITH ENDOBRONCHIAL NAVIGATION
Anesthesia: General | Laterality: Bilateral

## 2021-02-12 MED ORDER — ONDANSETRON HCL 4 MG/2ML IJ SOLN
INTRAMUSCULAR | Status: DC | PRN
  Administered 2021-02-12: 4 mg via INTRAVENOUS

## 2021-02-12 MED ORDER — PHENYLEPHRINE HCL (PRESSORS) 10 MG/ML IV SOLN
INTRAVENOUS | Status: DC | PRN
  Administered 2021-02-12 (×2): 80 ug via INTRAVENOUS
  Administered 2021-02-12: 120 ug via INTRAVENOUS

## 2021-02-12 MED ORDER — CHLORHEXIDINE GLUCONATE 0.12 % MT SOLN
15.0000 mL | Freq: Once | OROMUCOSAL | Status: AC
  Administered 2021-02-12: 15 mL via OROMUCOSAL
  Filled 2021-02-12 (×2): qty 15

## 2021-02-12 MED ORDER — PHENYLEPHRINE HCL-NACL 20-0.9 MG/250ML-% IV SOLN
INTRAVENOUS | Status: DC | PRN
  Administered 2021-02-12: 25 ug/min via INTRAVENOUS

## 2021-02-12 MED ORDER — FENTANYL CITRATE (PF) 250 MCG/5ML IJ SOLN
INTRAMUSCULAR | Status: DC | PRN
  Administered 2021-02-12 (×2): 50 ug via INTRAVENOUS

## 2021-02-12 MED ORDER — SUGAMMADEX SODIUM 200 MG/2ML IV SOLN
INTRAVENOUS | Status: DC | PRN
  Administered 2021-02-12: 25 mg via INTRAVENOUS
  Administered 2021-02-12: 100 mg via INTRAVENOUS

## 2021-02-12 MED ORDER — ROCURONIUM BROMIDE 10 MG/ML (PF) SYRINGE
PREFILLED_SYRINGE | INTRAVENOUS | Status: DC | PRN
  Administered 2021-02-12: 10 mg via INTRAVENOUS
  Administered 2021-02-12: 20 mg via INTRAVENOUS
  Administered 2021-02-12: 40 mg via INTRAVENOUS

## 2021-02-12 MED ORDER — DEXAMETHASONE SODIUM PHOSPHATE 10 MG/ML IJ SOLN
INTRAMUSCULAR | Status: DC | PRN
  Administered 2021-02-12: 5 mg via INTRAVENOUS

## 2021-02-12 MED ORDER — LACTATED RINGERS IV SOLN: INTRAVENOUS | Status: DC

## 2021-02-12 MED ORDER — LACTATED RINGERS IV SOLN: INTRAVENOUS | Status: DC | PRN

## 2021-02-12 MED ORDER — PROPOFOL 10 MG/ML IV BOLUS
INTRAVENOUS | Status: DC | PRN
  Administered 2021-02-12: 70 mg via INTRAVENOUS

## 2021-02-12 MED ORDER — EPHEDRINE SULFATE 50 MG/ML IJ SOLN
INTRAMUSCULAR | Status: DC | PRN
  Administered 2021-02-12: 5 mg via INTRAVENOUS

## 2021-02-12 MED ORDER — LIDOCAINE 2% (20 MG/ML) 5 ML SYRINGE
INTRAMUSCULAR | Status: DC | PRN
  Administered 2021-02-12: 40 mg via INTRAVENOUS

## 2021-02-12 MED ORDER — FENTANYL CITRATE (PF) 100 MCG/2ML IJ SOLN: 25.0000 ug | INTRAMUSCULAR | Status: DC | PRN

## 2021-02-12 SURGICAL SUPPLY — 46 items
ADAPTER BRONCH F/PENTAX (ADAPTER) ×3 IMPLANT
ADAPTER VALVE BIOPSY EBUS (MISCELLANEOUS) IMPLANT
ADPTR VALVE BIOPSY EBUS (MISCELLANEOUS)
BRUSH CYTOL CELLEBRITY 1.5X140 (MISCELLANEOUS) ×3 IMPLANT
BRUSH SUPERTRAX BIOPSY (INSTRUMENTS) IMPLANT
BRUSH SUPERTRAX NDL-TIP CYTO (INSTRUMENTS) ×3 IMPLANT
CANISTER SUCT 3000ML PPV (MISCELLANEOUS) ×3 IMPLANT
CHANNEL WORK EXTEND EDGE 180 (KITS) IMPLANT
CHANNEL WORK EXTEND EDGE 45 (KITS) IMPLANT
CHANNEL WORK EXTEND EDGE 90 (KITS) IMPLANT
CONT SPEC 4OZ CLIKSEAL STRL BL (MISCELLANEOUS) ×3 IMPLANT
COVER BACK TABLE 60X90IN (DRAPES) ×3 IMPLANT
FILTER STRAW FLUID ASPIR (MISCELLANEOUS) IMPLANT
FORCEPS BIOP SUPERTRX PREMAR (INSTRUMENTS) ×3 IMPLANT
GAUZE SPONGE 4X4 12PLY STRL (GAUZE/BANDAGES/DRESSINGS) ×3 IMPLANT
GLOVE SURG SS PI 7.5 STRL IVOR (GLOVE) ×6 IMPLANT
GOWN STRL REUS W/ TWL LRG LVL3 (GOWN DISPOSABLE) ×4 IMPLANT
GOWN STRL REUS W/TWL LRG LVL3 (GOWN DISPOSABLE) ×2
KIT CLEAN ENDO COMPLIANCE (KITS) ×3 IMPLANT
KIT LOCATABLE GUIDE (CANNULA) IMPLANT
KIT MARKER FIDUCIAL DELIVERY (KITS) IMPLANT
KIT PROCEDURE EDGE 180 (KITS) IMPLANT
KIT PROCEDURE EDGE 45 (KITS) IMPLANT
KIT PROCEDURE EDGE 90 (KITS) IMPLANT
KIT TURNOVER KIT B (KITS) ×3 IMPLANT
MARKER SKIN DUAL TIP RULER LAB (MISCELLANEOUS) ×3 IMPLANT
NEEDLE SUPERTRX PREMARK BIOPSY (NEEDLE) ×3 IMPLANT
NS IRRIG 1000ML POUR BTL (IV SOLUTION) ×3 IMPLANT
OIL SILICONE PENTAX (PARTS (SERVICE/REPAIRS)) ×3 IMPLANT
PAD ARMBOARD 7.5X6 YLW CONV (MISCELLANEOUS) ×6 IMPLANT
PATCHES PATIENT (LABEL) ×9 IMPLANT
SOL ANTI FOG 6CC (MISCELLANEOUS) ×2 IMPLANT
SOLUTION ANTI FOG 6CC (MISCELLANEOUS) ×1
SYR 20CC LL (SYRINGE) ×3 IMPLANT
SYR 20ML ECCENTRIC (SYRINGE) ×3 IMPLANT
SYR 50ML SLIP (SYRINGE) ×3 IMPLANT
Superlock fiducial marker ×3 IMPLANT
TOWEL OR 17X24 6PK STRL BLUE (TOWEL DISPOSABLE) ×3 IMPLANT
TRAP SPECIMEN MUCOUS 40CC (MISCELLANEOUS) IMPLANT
TUBE CONNECTING 20X1/4 (TUBING) ×3 IMPLANT
UNDERPAD 30X30 (UNDERPADS AND DIAPERS) ×3 IMPLANT
VALVE BIOPSY  SINGLE USE (MISCELLANEOUS) ×1
VALVE BIOPSY SINGLE USE (MISCELLANEOUS) ×2 IMPLANT
VALVE SUCTION BRONCHIO DISP (MISCELLANEOUS) ×3 IMPLANT
WATER STERILE IRR 1000ML POUR (IV SOLUTION) ×3 IMPLANT
superlock fiducial marker ×3 IMPLANT

## 2021-02-12 NOTE — Anesthesia Procedure Notes (Signed)
Procedure Name: Intubation Date/Time: 02/12/2021 7:39 AM Performed by: Glynda Jaeger, CRNA Pre-anesthesia Checklist: Patient identified, Patient being monitored, Timeout performed, Emergency Drugs available and Suction available Patient Re-evaluated:Patient Re-evaluated prior to induction Oxygen Delivery Method: Circle System Utilized Preoxygenation: Pre-oxygenation with 100% oxygen Induction Type: IV induction Ventilation: Mask ventilation without difficulty Laryngoscope Size: Mac and 4 Grade View: Grade I Tube type: Oral Tube size: 8.5 mm Number of attempts: 1 Airway Equipment and Method: Stylet Placement Confirmation: ETT inserted through vocal cords under direct vision, positive ETCO2 and breath sounds checked- equal and bilateral Secured at: 21 cm Tube secured with: Tape Dental Injury: Teeth and Oropharynx as per pre-operative assessment

## 2021-02-12 NOTE — Anesthesia Postprocedure Evaluation (Signed)
Anesthesia Post Note  Patient: Beth Daniels  Procedure(s) Performed: VIDEO BRONCHOSCOPY WITH ENDOBRONCHIAL NAVIGATION (Bilateral) BRONCHIAL BRUSHINGS BRONCHIAL NEEDLE ASPIRATION BIOPSIES RADIAL ENDOBRONCHIAL ULTRASOUND FIDUCIAL MARKER PLACEMENT     Patient location during evaluation: PACU Anesthesia Type: General Level of consciousness: awake and alert Pain management: pain level controlled Vital Signs Assessment: post-procedure vital signs reviewed and stable Respiratory status: spontaneous breathing, nonlabored ventilation, respiratory function stable and patient connected to nasal cannula oxygen Cardiovascular status: blood pressure returned to baseline and stable Postop Assessment: no apparent nausea or vomiting Anesthetic complications: no   No notable events documented.  Last Vitals:  Vitals:   02/12/21 1045 02/12/21 1100  BP: (!) 114/55 108/60  Pulse: 91 85  Resp: 14 18  Temp:  36.4 C  SpO2: 95% 97%    Last Pain:  Vitals:   02/12/21 1100  TempSrc:   PainSc: 0-No pain                 Darby Fleeman L Jaimi Belle

## 2021-02-12 NOTE — Interval H&P Note (Signed)
History and Physical Interval Note:  02/12/2021 6:48 AM  Beth Daniels  has presented today for surgery, with the diagnosis of Lung nodules.  The various methods of treatment have been discussed with the patient and family. After consideration of risks, benefits and other options for treatment, the patient has consented to  Procedure(s) with comments: [place] (Bilateral) - ION as a surgical intervention.  The patient's history has been reviewed, patient examined, no change in status, stable for surgery.  I have reviewed the patient's chart and labs.  Questions were answered to the patient's satisfaction.     [place]

## 2021-02-12 NOTE — Transfer of Care (Signed)
Immediate Anesthesia Transfer of Care Note  Patient: Beth Daniels  Procedure(s) Performed: VIDEO BRONCHOSCOPY WITH ENDOBRONCHIAL NAVIGATION (Bilateral) BRONCHIAL BRUSHINGS BRONCHIAL NEEDLE ASPIRATION BIOPSIES RADIAL ENDOBRONCHIAL ULTRASOUND FIDUCIAL MARKER PLACEMENT  Patient Location: PACU  Anesthesia Type:General  Level of Consciousness: drowsy, patient cooperative and responds to stimulation  Airway & Oxygen Therapy: Patient Spontanous Breathing and Patient connected to face mask oxygen  Post-op Assessment: Report given to RN and Post -op Vital signs reviewed and stable  Post vital signs: Reviewed and stable  Last Vitals:  Vitals Value Taken Time  BP 123/70 02/12/21 1015  Temp 36.5 C 02/12/21 1015  Pulse 90 02/12/21 1017  Resp 18 02/12/21 1017  SpO2 100 % 02/12/21 1017  Vitals shown include unvalidated device data.  Last Pain:  Vitals:   02/12/21 0617  TempSrc: Oral  PainSc: 0-No pain      Patients Stated Pain Goal: 5 (97/47/18 5501)  Complications: No notable events documented.

## 2021-02-12 NOTE — Anesthesia Preprocedure Evaluation (Addendum)
Anesthesia Evaluation  Patient identified by MRN, date of birth, ID band Patient awake    Reviewed: Allergy & Precautions, NPO status , Patient's Chart, lab work & pertinent test results  Airway Mallampati: III  TM Distance: >3 FB Neck ROM: Full    Dental no notable dental hx.  Crown, bridge:   Pulmonary shortness of breath, COPD (on 2L O2),  COPD inhaler and oxygen dependent, former smoker,    Pulmonary exam normal breath sounds clear to auscultation       Cardiovascular + Peripheral Vascular Disease  Normal cardiovascular exam Rhythm:Regular Rate:Normal     Neuro/Psych negative neurological ROS  negative psych ROS   GI/Hepatic GERD  ,(+)     substance abuse  alcohol use,   Endo/Other  negative endocrine ROS  Renal/GU negative Renal ROS  negative genitourinary   Musculoskeletal negative musculoskeletal ROS (+)   Abdominal   Peds  Hematology negative hematology ROS (+)   Anesthesia Other Findings   Reproductive/Obstetrics                            Anesthesia Physical Anesthesia Plan  ASA: 3  Anesthesia Plan: General   Post-op Pain Management:    Induction: Intravenous  PONV Risk Score and Plan: 3 and Midazolam, Dexamethasone and Ondansetron  Airway Management Planned: Oral ETT  Additional Equipment:   Intra-op Plan:   Post-operative Plan: Extubation in OR  Informed Consent: I have reviewed the patients History and Physical, chart, labs and discussed the procedure including the risks, benefits and alternatives for the proposed anesthesia with the patient or authorized representative who has indicated his/her understanding and acceptance.     Dental advisory given  Plan Discussed with: CRNA  Anesthesia Plan Comments:         Anesthesia Quick Evaluation

## 2021-02-12 NOTE — Discharge Instructions (Signed)
Flexible Bronchoscopy, Care After This sheet gives you information about how to care for yourself after your test. Your doctor may also give you more specific instructions. If you have problems or questions, contact your doctor. Follow these instructions at home: Eating and drinking Do not eat or drink anything (not even water) for 2 hours after your test, or until your numbing medicine (local anesthetic) wears off. When your numbness is gone and your cough and gag reflexes have come back, you may: Eat only soft foods. Slowly drink liquids. The day after the test, go back to your normal diet. Driving Do not drive for 24 hours if you were given a medicine to help you relax (sedative). Do not drive or use heavy machinery while taking prescription pain medicine. General instructions  Take over-the-counter and prescription medicines only as told by your doctor. Return to your normal activities as told. Ask what activities are safe for you. Do not use any products that have nicotine or tobacco in them. This includes cigarettes and e-cigarettes. If you need help quitting, ask your doctor. Keep all follow-up visits as told by your doctor. This is important. It is very important if you had a tissue sample (biopsy) taken. Get help right away if: You have shortness of breath that gets worse. You get light-headed. You feel like you are going to pass out (faint). You have chest pain. You cough up: More than a little blood. More blood than before. Summary Do not eat or drink anything (not even water) for 2 hours after your test, or until your numbing medicine wears off. Do not use cigarettes. Do not use e-cigarettes. Get help right away if you have chest pain.  This information is not intended to replace advice given to you by your health care provider. Make sure you discuss any questions you have with your health care provider. Document Released: 04/20/2009 Document Revised: 06/05/2017 Document  Reviewed: 07/11/2016 Elsevier Patient Education  2020 [employer].

## 2021-02-12 NOTE — Op Note (Addendum)
Video Bronchoscopy with Robotic Assisted Bronchoscopic Navigation   Date of Operation: 02/12/2021  Pre-op Diagnosis: Multiple lung nodules  Post-op Diagnosis: Multiple lung nodules  Surgeon: Garner Nash, DO  Assistants: Baltazar Apo, MD  Anesthesia: General endotracheal anesthesia  Operation: Flexible video fiberoptic bronchoscopy with robotic assistance and biopsies.  Estimated Blood Loss: Minimal  Complications: None  Indications and History: Beth Daniels is a 75 y.o. female with history of multiple pulmonary nodules. The risks, benefits, complications, treatment options and expected outcomes were discussed with the patient.  The possibilities of pneumothorax, pneumonia, reaction to medication, pulmonary aspiration, perforation of a viscus, bleeding, failure to diagnose a condition and creating a complication requiring transfusion or operation were discussed with the patient who freely signed the consent.    Description of Procedure: The patient was seen in the Preoperative Area, was examined and was deemed appropriate to proceed.  The patient was taken to [facility] endoscopy room 3, identified as Beth Daniels and the procedure verified as Flexible Video Fiberoptic Bronchoscopy.  A Time Out was held and the above information confirmed.   Prior to the date of the procedure a high-resolution CT scan of the chest was performed. Utilizing ION software program a virtual tracheobronchial tree was generated to allow the creation of distinct navigation pathways to the patient's parenchymal abnormalities. After being taken to the operating room general anesthesia was initiated and the patient  was orally intubated. The video fiberoptic bronchoscope was introduced via the endotracheal tube and a general inspection was performed which showed normal right and left lung anatomy, aspiration of the bilateral mainstems was completed to remove any remaining secretions. Robotic catheter inserted into patient's  endotracheal tube.   Target #1 right lower lobe superior segment: The distinct navigation pathways prepared prior to this procedure were then utilized to navigate to patient's lesion identified on CT scan. The robotic catheter was secured into place and the vision probe was withdrawn.  Lesion location was approximated using fluoroscopy and radial endobronchial ultrasound for peripheral targeting. Under fluoroscopic guidance transbronchial needle brushings, transbronchial needle biopsies were performed to be sent for cytology and pathology.  After sampling of the lesion in question we placed a single fiducial marker under fluoroscopy.  Target #2 right upper lobe anterior: The distinct navigation pathways prepared prior to this procedure were then utilized to navigate to patient's lesion identified on CT scan. The robotic catheter was secured into place and the vision probe was withdrawn.  Lesion location was approximated using fluoroscopy and radial endobronchial ultrasound for peripheral targeting.  After navigating attempts at 3 separate pathways we were unable to secure a adequate visualization of the nodule.  And no specimens were obtained.  Target #3 left lower lobe: The distinct navigation pathways prepared prior to this procedure were then utilized to navigate to patient's lesion identified on CT scan. The robotic catheter was secured into place and the vision probe was withdrawn.  Lesion location was approximated using fluoroscopy and radial endobronchial ultrasound for peripheral targeting. Under fluoroscopic guidance transbronchial needle brushings were performed to be sent for cytology and pathology.  After sampling a single fiducial was placed under fluoroscopic guidance.  At the end of the procedure a general airway inspection was performed and there was no evidence of active bleeding. The bronchoscope was removed.  The patient tolerated the procedure well. There was no significant blood loss  and there were no obvious complications. A post-procedural chest x-ray is pending.  Samples Target #1: 1. Transbronchial needle  brushings from right lower lobe superior segment 2. Transbronchial Wang needle biopsies from right lower lobe superior segment  Samples Target #2: 1.  No samples obtained  Samples Target #3: 1. Transbronchial needle brushings from left lower lobe  Plans:  The patient will be discharged from the PACU to home when recovered from anesthesia and after chest x-ray is reviewed. We will review the cytology, pathology and microbiology results with the patient when they become available. Outpatient followup will be with Octavio Graves Lavern Maslow, DO.

## 2021-02-13 ENCOUNTER — Encounter (HOSPITAL_COMMUNITY): Payer: Self-pay | Admitting: Pulmonary Disease

## 2021-02-14 LAB — CYTOLOGY - NON PAP

## 2021-02-15 ENCOUNTER — Telehealth: Payer: Self-pay | Admitting: Emergency Medicine

## 2021-02-15 NOTE — Telephone Encounter (Signed)
Discussed right lower lobe and left lower lobe cytology results with the patient by phone today.  Both specimens negative for malignancy.  Advised her that she will talk to Dr. Valeta Harms at their [facility] regarding the timing of repeat imaging and surveillance  FYI to Dr. Valeta Harms

## 2021-02-28 ENCOUNTER — Encounter: Payer: Self-pay | Admitting: Pulmonary Disease

## 2021-02-28 ENCOUNTER — Ambulatory Visit: Payer: Medicare Other | Admitting: Pulmonary Disease

## 2021-02-28 ENCOUNTER — Other Ambulatory Visit: Payer: Self-pay

## 2021-02-28 ENCOUNTER — Ambulatory Visit (INDEPENDENT_AMBULATORY_CARE_PROVIDER_SITE_OTHER): Payer: Medicare Other | Admitting: Pulmonary Disease

## 2021-02-28 VITALS — BP 110/56 | HR 89 | Temp 98.6°F | Ht 62.0 in | Wt 93.6 lb

## 2021-02-28 DIAGNOSIS — R918 Other nonspecific abnormal finding of lung field: Secondary | ICD-10-CM | POA: Diagnosis not present

## 2021-02-28 DIAGNOSIS — J449 Chronic obstructive pulmonary disease, unspecified: Secondary | ICD-10-CM | POA: Diagnosis not present

## 2021-02-28 DIAGNOSIS — R9389 Abnormal findings on diagnostic imaging of other specified body structures: Secondary | ICD-10-CM | POA: Diagnosis not present

## 2021-02-28 DIAGNOSIS — J9611 Chronic respiratory failure with hypoxia: Secondary | ICD-10-CM

## 2021-02-28 NOTE — Progress Notes (Signed)
Synopsis: Referred in July 2022 for lung nodule by Shon Baton, MD  Subjective:   PATIENT ID: Beth Daniels GENDER: female DOB: 18-Nov-1945, MRN: [id number]  Chief Complaint  Patient presents with   Follow-up    Pt is here for a follow up post bronch.  Pt states she has been doing okay since recent bronch. States for the first couple of days she was coughing up some blood but that did subside.    This is a 75 year old female, past medical history of breast cancer on the right 9 years ago status post bilateral mastectomy, severe emphysema, chronic hypoxemic respiratory failure on 2 L nasal cannula.  Patient has had multiple CT images over the past 2+ years.  Has shown interval growth of at least 3 nodules.  She has a right upper lobe anterior nodule that has grown in size by at least 1 to 2 mm as well as a right lower lobe superior segment lesion that has grown.  And she has a left lung nodule that has grown from 4 mm to 7 mm.  From a respiratory standpoint she has been stable on her 2 L nasal cannula and compliant with her inhaler regimen.  OV 02/28/2021: Here today for follow-up regarding bronchoscopy.  All lung nodules that were sampled during bronchoscopy had no evidence of malignancy.  All were in difficult locations that are not sure that we were in the space.  We went over the imaging today in the office and explained the diagnostic yield again regarding small peripheral lesions and the utility of navigational bronchoscopy.  Most of her lesions 1 of which was in a very precarious location to access.  The right lower lobe superior segment lesion would be the 1 of which I am most concerned of being a potential malignancy due to its lobulated shape.  We also discussed this in detail.   Past Medical History:  Diagnosis Date   Alcohol abuse    Anemia    Cancer ([place])    breast right   Chronic hyponatremia    COPD (chronic obstructive pulmonary disease) (HCC)    DCIS (ductal carcinoma in situ)     Dyspnea    Emphysema of lung (HCC)    Osteoporosis    Overactive bladder    PVD (peripheral vascular disease) ([place])    Tobacco use      Family History  Problem Relation Age of Onset   Alzheimer's disease Mother    Pneumonia Father        aspiration pneumonia     Past Surgical History:  Procedure Laterality Date   ABDOMINAL AORTOGRAM W/LOWER EXTREMITY N/A 03/15/2019   Procedure: ABDOMINAL AORTOGRAM W/LOWER EXTREMITY;  Surgeon: Serafina Mitchell, MD;  Location: [facility];  Service: Cardiovascular;  Laterality: N/A;   aeptoplasty     BREAST SURGERY     BRONCHIAL BRUSHINGS  02/12/2021   Procedure: BRONCHIAL BRUSHINGS;  Surgeon: Garner Nash, DO;  Location: [facility];  Service: Pulmonary;;   BRONCHIAL NEEDLE ASPIRATION BIOPSY  02/12/2021   Procedure: BRONCHIAL NEEDLE ASPIRATION BIOPSIES;  Surgeon: Garner Nash, DO;  Location: [facility];  Service: Pulmonary;;   cartaract extraction     EYE SURGERY Bilateral    Cataract in R & L eye and Retina surgery after cataract surgery in L eye   FIDUCIAL MARKER PLACEMENT  02/12/2021   Procedure: FIDUCIAL MARKER PLACEMENT;  Surgeon: Garner Nash, DO;  Location: [facility];  Service: Pulmonary;;  INTRAMEDULLARY (IM) NAIL INTERTROCHANTERIC Left 01/30/2019   Procedure: INTRAMEDULLARY (IM) NAIL INTERTROCHANTRIC;  Surgeon: Frederik Pear, MD;  Location: [facility];  Service: Orthopedics;  Laterality: Left;   IR FIBRIN GLUE REPAIR ANAL FISTULA     MASTECTOMY     s/p bl   PERIPHERAL VASCULAR INTERVENTION  03/15/2019   Procedure: PERIPHERAL VASCULAR INTERVENTION;  Surgeon: Serafina Mitchell, MD;  Location: [facility];  Service: Cardiovascular;;  Lt. SFA   PERIPHERAL VASCULAR THROMBECTOMY  03/15/2019   Procedure: PERIPHERAL VASCULAR THROMBECTOMY;  Surgeon: Serafina Mitchell, MD;  Location: [facility];  Service: Cardiovascular;;  Lt. AT, PT   RETINAL DETACHMENT SURGERY     sclerotherapy vein leg     VASCULAR SURGERY      VIDEO BRONCHOSCOPY WITH ENDOBRONCHIAL NAVIGATION Bilateral 02/12/2021   Procedure: VIDEO BRONCHOSCOPY WITH ENDOBRONCHIAL NAVIGATION;  Surgeon: Garner Nash, DO;  Location: [facility];  Service: Pulmonary;  Laterality: Bilateral;  ION   VIDEO BRONCHOSCOPY WITH RADIAL ENDOBRONCHIAL ULTRASOUND  02/12/2021   Procedure: RADIAL ENDOBRONCHIAL ULTRASOUND;  Surgeon: Garner Nash, DO;  Location: [facility];  Service: Pulmonary;;   vitreous retinal surgery      Social History   Socioeconomic History   Marital status: Married    Spouse name: Elenore Rota   Number of children: Not on file   Years of education: Not on file   Highest education level: Not on file  Occupational History   Occupation: RETIRED  Tobacco Use   Smoking status: Former    Packs/day: 1.50    Years: 50.00    Pack years: 75.00    Types: Cigarettes    Start date: 1970    Quit date: 10/06/2016    Years since quitting: 4.4   Smokeless tobacco: Never  Vaping Use   Vaping Use: Never used  Substance and Sexual Activity   Alcohol use: Yes    Alcohol/week: 28.0 standard drinks    Types: 28 Glasses of wine per week    Comment: 4 glasses wine a night   Drug use: Not Currently   Sexual activity: Not Currently  Other Topics Concern   Not on file  Social History Narrative   Right handed,    Lives with husband   Drinks 2 cups caffeine daily   Social Determinants of Health   Financial Resource Strain: Not on file  Food Insecurity: Not on file  Transportation Needs: Not on file  Physical Activity: Not on file  Stress: Not on file  Social Connections: Not on file  Intimate Partner Violence: Not on file     No Known Allergies   Outpatient Medications Prior to Visit  Medication Sig Dispense Refill   albuterol (PROVENTIL) (2.5 MG/3ML) 0.083% nebulizer solution Take 3 mLs (2.5 mg total) by nebulization every 4 (four) hours as needed for wheezing or shortness of breath. 75 mL 3   ANORO ELLIPTA 62.5-25 MCG/INH AEPB USE 1  INHALATION DAILY (Patient taking differently: Inhale 1 puff into the lungs daily.) 180 each 3   aspirin EC 81 MG tablet Take 81 mg by mouth daily.     atorvastatin (LIPITOR) 20 MG tablet Take 20 mg by mouth daily.     azelastine (ASTELIN) 0.1 % nasal spray USE 1 SPRAY IN EACH NOSTRIL TWICE A DAY AS DIRECTED (Patient taking differently: Place 1 spray into both nostrils 2 (two) times daily.) 30 mL 6   Azelastine-Fluticasone 137-50 MCG/ACT SUSP Place 1 spray into the nose daily. (Patient taking differently:  Place 1 spray into the nose 2 (two) times daily.) 23 g 6   conjugated estrogens (PREMARIN) vaginal cream Place 1 Applicatorful vaginally every Monday, Wednesday, and Friday at 8 PM.     Cranberry 400 MG CAPS Take 1,200 mg by mouth daily at 2 PM.     denosumab (PROLIA) 60 MG/ML SOSY injection Inject 60 mg into the skin every 6 (six) months.      docusate sodium (COLACE) 100 MG capsule Take 1 capsule (100 mg total) by mouth 2 (two) times daily. (Patient taking differently: Take 100 mg by mouth at bedtime.) 10 capsule 0   Ferrous Fumarate (HEMOCYTE - 106 MG FE) 324 (106 Fe) MG TABS tablet Take 1 tablet by mouth daily at 3 pm.      fluticasone (FLONASE) 50 MCG/ACT nasal spray Place 2 sprays into both nostrils 2 (two) times daily. 9.9 mL 5   folic acid (FOLVITE) 1 MG tablet Take 1 mg by mouth daily.     gabapentin (NEURONTIN) 100 MG capsule Take 1 capsule (100 mg total) by mouth 3 (three) times daily as needed. (Patient taking differently: Take 100 mg by mouth daily at 12 noon. 3pm) 270 capsule 3   gabapentin (NEURONTIN) 300 MG capsule Take 2 capsules (600 mg total) by mouth 3 (three) times daily. 540 capsule 3   guaiFENesin (MUCINEX) 600 MG 12 hr tablet Take 600 mg by mouth 2 (two) times daily.     hydrocortisone 2.5 % cream Apply 1 application topically daily.     Multiple Vitamin (MULTI-VITAMIN) tablet Take 1 tablet by mouth daily.      mupirocin ointment (BACTROBAN) 2 % Apply 1 application topically  daily as needed (wound care).      MYRBETRIQ 50 MG TB24 tablet Take 50 mg by mouth at bedtime.     nicotine polacrilex (COMMIT) 4 MG lozenge Take 4 mg by mouth as needed for smoking cessation.      OXYGEN Inhale 2 L into the lungs continuous.     pantoprazole (PROTONIX) 40 MG tablet Take 40 mg by mouth daily.     PROAIR HFA 108 (90 Base) MCG/ACT inhaler USE 1 INHALATION EVERY 6 HOURS AS NEEDED FOR WHEEZING OR SHORTNESS OF BREATH (Patient taking differently: Inhale 1 puff into the lungs 2 (two) times daily. As needed during the day) 17 g 3   sodium chloride (OCEAN) 0.65 % SOLN nasal spray Place 1 spray into both nostrils 2 (two) times daily. As needed during the day     Vitamin D, Cholecalciferol, 25 MCG (1000 UT) TABS Take 1,000 Units by mouth daily.     atorvastatin (LIPITOR) 20 MG tablet Take 20 mg by mouth at bedtime.      No facility-administered medications prior to visit.    Review of Systems  Constitutional:  Negative for chills, fever, malaise/fatigue and weight loss.  HENT:  Negative for hearing loss, sore throat and tinnitus.   Eyes:  Negative for blurred vision and double vision.  Respiratory:  Positive for shortness of breath. Negative for cough, hemoptysis, sputum production, wheezing and stridor.   Cardiovascular:  Negative for chest pain, palpitations, orthopnea, leg swelling and PND.  Gastrointestinal:  Negative for abdominal pain, constipation, diarrhea, heartburn, nausea and vomiting.  Genitourinary:  Negative for dysuria, hematuria and urgency.  Musculoskeletal:  Negative for joint pain and myalgias.  Skin:  Negative for itching and rash.  Neurological:  Negative for dizziness, tingling, weakness and headaches.  Endo/Heme/Allergies:  Negative for environmental allergies.  Does not bruise/bleed easily.  Psychiatric/Behavioral:  Negative for depression. The patient is not nervous/anxious and does not have insomnia.   All other systems reviewed and are  negative.   Objective:  Physical Exam Vitals reviewed.  Constitutional:      General: She is not in acute distress.    Appearance: She is well-developed.  HENT:     Head: Normocephalic and atraumatic.     Mouth/Throat:     Pharynx: No oropharyngeal exudate.  Eyes:     Conjunctiva/sclera: Conjunctivae normal.     Pupils: Pupils are equal, round, and reactive to light.  Neck:     Vascular: No JVD.     Trachea: No tracheal deviation.     Comments: Loss of supraclavicular fat Cardiovascular:     Rate and Rhythm: Normal rate and regular rhythm.     Heart sounds: S1 normal and S2 normal.     Comments: Distant heart tones Pulmonary:     Effort: No tachypnea or accessory muscle usage.     Breath sounds: No stridor. Decreased breath sounds (throughout all lung fields) present. No wheezing, rhonchi or rales.  Abdominal:     General: Bowel sounds are normal. There is no distension.     Palpations: Abdomen is soft.     Tenderness: There is no abdominal tenderness.  Musculoskeletal:        General: Deformity (muscle wasting ) present.  Skin:    General: Skin is warm and dry.     Capillary Refill: Capillary refill takes less than 2 seconds.     Findings: No rash.  Neurological:     Mental Status: She is alert and oriented to person, place, and time.  Psychiatric:        Behavior: Behavior normal.     Vitals:   02/28/21 1456  BP: (!) 110/56  Pulse: 89  Temp: 98.6 F (37 C)  TempSrc: Oral  SpO2: 91%  Weight: 93 lb 9.6 oz (42.5 kg)  Height: 5\' 2"  (1.575 m)   91% on RA BMI Readings from Last 3 Encounters:  02/28/21 17.12 kg/m  02/12/21 17.01 kg/m  01/16/21 17.12 kg/m   Wt Readings from Last 3 Encounters:  02/28/21 93 lb 9.6 oz (42.5 kg)  02/12/21 93 lb (42.2 kg)  01/16/21 93 lb 9.6 oz (42.5 kg)     CBC    Component Value Date/Time   WBC 3.8 (L) 02/12/2021 0628   RBC 3.53 (L) 02/12/2021 0628   HGB 12.0 02/12/2021 0628   HCT 36.4 02/12/2021 0628   PLT 201  02/12/2021 0628   MCV 103.1 (H) 02/12/2021 0628   MCH 34.0 02/12/2021 0628   MCHC 33.0 02/12/2021 0628   RDW 12.9 02/12/2021 0628   LYMPHSABS 0.8 01/29/2019 1354   MONOABS 0.3 01/29/2019 1354   EOSABS 0.0 01/29/2019 1354   BASOSABS 0.0 01/29/2019 1354     Chest Imaging: 01/09/2021 CT chest: History of breast cancer, now has nodule surveillance with multiple small pulmonary nodules that have increased in size.  Right lower lobe superior segment now 10 mm, right upper lobe 9 mm lesion as well as a lingular semisolid lesion that was 4 mm and now 7 years.  Several multiple small subsolid lesions and solid lesions that have large.  The suprasellar lesion is macrolobulated, the right upper lobe anterior lesion is smoother bordered.  We may be dealing with metachronous primary lung cancers due to the patient's history of smoking and severe emphysema. The patient's images have  been independently reviewed by me.  02/05/2021 CT chest: Stable pulmonary nodules in comparison to July CT.  Lobulated superior segment lesion.  Pulmonary Functions Testing Results: No flowsheet data found.  FeNO:   Pathology:   Echocardiogram:   Heart Catheterization:     Assessment & Plan:     ICD-10-CM   1. Multiple lung nodules on CT  R91.8 CT Super D Chest Wo Contrast    2. Chronic obstructive pulmonary disease, unspecified COPD type ([place])  J44.9     3. Abnormal CT of the chest  R93.89     4. Chronic hypoxemic respiratory failure (HCC)  J96.11       Discussion:  This is a 75 year old female, slowly enlarging multiple pulmonary nodules, all have showed some small amount of subcentimeter growth.  She does have very severe emphysema and is chronic hypoxic respiratory failure, oxygen dependent at baseline.  We reviewed her recent bronchoscopy results.  All tissue samples were negative for malignancy however we went into the case understanding that all are very small and difficult to access.  Plan: I think  due to her severity of lung disease the next best step would be repeat clinical surveillance with imaging. She is agreeable to this plan. Repeat noncontrasted CT scan of the chest in 3 months. If there is any enlargement or change in these nodules I think due to the severity of her lung disease the right next step would be empiric SBRT treatments.    Current Outpatient Medications:    albuterol (PROVENTIL) (2.5 MG/3ML) 0.083% nebulizer solution, Take 3 mLs (2.5 mg total) by nebulization every 4 (four) hours as needed for wheezing or shortness of breath., Disp: 75 mL, Rfl: 3   ANORO ELLIPTA 62.5-25 MCG/INH AEPB, USE 1 INHALATION DAILY (Patient taking differently: Inhale 1 puff into the lungs daily.), Disp: 180 each, Rfl: 3   aspirin EC 81 MG tablet, Take 81 mg by mouth daily., Disp: , Rfl:    atorvastatin (LIPITOR) 20 MG tablet, Take 20 mg by mouth daily., Disp: , Rfl:    azelastine (ASTELIN) 0.1 % nasal spray, USE 1 SPRAY IN EACH NOSTRIL TWICE A DAY AS DIRECTED (Patient taking differently: Place 1 spray into both nostrils 2 (two) times daily.), Disp: 30 mL, Rfl: 6   Azelastine-Fluticasone 137-50 MCG/ACT SUSP, Place 1 spray into the nose daily. (Patient taking differently: Place 1 spray into the nose 2 (two) times daily.), Disp: 23 g, Rfl: 6   conjugated estrogens (PREMARIN) vaginal cream, Place 1 Applicatorful vaginally every Monday, Wednesday, and Friday at 8 PM., Disp: , Rfl:    Cranberry 400 MG CAPS, Take 1,200 mg by mouth daily at 2 PM., Disp: , Rfl:    denosumab (PROLIA) 60 MG/ML SOSY injection, Inject 60 mg into the skin every 6 (six) months. , Disp: , Rfl:    docusate sodium (COLACE) 100 MG capsule, Take 1 capsule (100 mg total) by mouth 2 (two) times daily. (Patient taking differently: Take 100 mg by mouth at bedtime.), Disp: 10 capsule, Rfl: 0   Ferrous Fumarate (HEMOCYTE - 106 MG FE) 324 (106 Fe) MG TABS tablet, Take 1 tablet by mouth daily at 3 pm. , Disp: , Rfl:    fluticasone  (FLONASE) 50 MCG/ACT nasal spray, Place 2 sprays into both nostrils 2 (two) times daily., Disp: 9.9 mL, Rfl: 5   folic acid (FOLVITE) 1 MG tablet, Take 1 mg by mouth daily., Disp: , Rfl:    gabapentin (NEURONTIN) 100 MG capsule, Take  1 capsule (100 mg total) by mouth 3 (three) times daily as needed. (Patient taking differently: Take 100 mg by mouth daily at 12 noon. 3pm), Disp: 270 capsule, Rfl: 3   gabapentin (NEURONTIN) 300 MG capsule, Take 2 capsules (600 mg total) by mouth 3 (three) times daily., Disp: 540 capsule, Rfl: 3   guaiFENesin (MUCINEX) 600 MG 12 hr tablet, Take 600 mg by mouth 2 (two) times daily., Disp: , Rfl:    hydrocortisone 2.5 % cream, Apply 1 application topically daily., Disp: , Rfl:    Multiple Vitamin (MULTI-VITAMIN) tablet, Take 1 tablet by mouth daily. , Disp: , Rfl:    mupirocin ointment (BACTROBAN) 2 %, Apply 1 application topically daily as needed (wound care). , Disp: , Rfl:    MYRBETRIQ 50 MG TB24 tablet, Take 50 mg by mouth at bedtime., Disp: , Rfl:    nicotine polacrilex (COMMIT) 4 MG lozenge, Take 4 mg by mouth as needed for smoking cessation. , Disp: , Rfl:    OXYGEN, Inhale 2 L into the lungs continuous., Disp: , Rfl:    pantoprazole (PROTONIX) 40 MG tablet, Take 40 mg by mouth daily., Disp: , Rfl:    PROAIR HFA 108 (90 Base) MCG/ACT inhaler, USE 1 INHALATION EVERY 6 HOURS AS NEEDED FOR WHEEZING OR SHORTNESS OF BREATH (Patient taking differently: Inhale 1 puff into the lungs 2 (two) times daily. As needed during the day), Disp: 17 g, Rfl: 3   sodium chloride (OCEAN) 0.65 % SOLN nasal spray, Place 1 spray into both nostrils 2 (two) times daily. As needed during the day, Disp: , Rfl:    Vitamin D, Cholecalciferol, 25 MCG (1000 UT) TABS, Take 1,000 Units by mouth daily., Disp: , Rfl:    atorvastatin (LIPITOR) 20 MG tablet, Take 20 mg by mouth at bedtime. , Disp: , Rfl:    I spent 32 minutes dedicated to the care of this patient on the date of this encounter to  include pre-visit review of records, face-to-face time with the patient discussing conditions above, post visit ordering of testing, clinical documentation with the electronic health record, making appropriate referrals as documented, and communicating necessary findings to members of the patients care team.    Garner Nash, [facility] 02/28/2021 3:10 PM

## 2021-02-28 NOTE — Patient Instructions (Signed)
Thank you for visiting Dr. Valeta Harms at [facility]. Today we recommend the following:  Orders Placed This Encounter  Procedures   CT Super D Chest Wo Contrast   Return in about 3 months (around 05/31/2021) for with APP or Dr. Valeta Harms. After ct chest complete.     Please do your part to reduce the spread of COVID-19.

## 2021-03-25 NOTE — Telephone Encounter (Signed)
Dr. Ander Slade and Dr. Valeta Harms, please see mychart message below as patient wanted it sent to both of you, thanks!  Dr. Ander Slade,  Just to keep you informed, wanted to let you know that we had a suspicion that we may have mold of some sort in our duct system.  We had a company out today to take test from floor vents and duct work under the house and should have results within the next few days.     My questions for you are:  Are there any test for mold infections for myself and spouse, Beth Daniels? Are there medical procedures to address this potential situation?   Depending on test results, we will most likely remove the humidifier in the crawl space,  have the duct work cleaned or replaced, replace the unsealed vapor barrier with an encapsulated barrier, and what other recommendations we received from the [employer] who came highly recommended.   Please share with Dr. Valeta Harms in the event this may have impacted the growth of my lung nodules.    Thanks,  Beth Daniels  DOB [phone]

## 2021-03-27 NOTE — Telephone Encounter (Signed)
May cause shortness of breath, worsening asthma symptoms  No testing needs done if she is not having symptoms  She should call us if she is having any symptoms, most people will not have any symptoms.

## 2021-03-29 ENCOUNTER — Other Ambulatory Visit: Payer: Self-pay | Admitting: Pulmonary Disease

## 2021-03-29 DIAGNOSIS — Z77120 Contact with and (suspected) exposure to mold (toxic): Secondary | ICD-10-CM

## 2021-03-29 NOTE — Telephone Encounter (Signed)
Reviewed materials / paperwork sent    Only thing to do is avoid ongoing exposure  Not everybody exposed will develop any illness from it  Order for hypersensitivity panel placed- Blood work  Nothing else needs done at present from my perspective

## 2021-04-03 ENCOUNTER — Ambulatory Visit (INDEPENDENT_AMBULATORY_CARE_PROVIDER_SITE_OTHER): Payer: Medicare Other | Admitting: Neurology

## 2021-04-03 ENCOUNTER — Encounter: Payer: Self-pay | Admitting: Neurology

## 2021-04-03 ENCOUNTER — Other Ambulatory Visit: Payer: Medicare Other

## 2021-04-03 VITALS — BP 124/62 | HR 88 | Ht 62.0 in | Wt 94.6 lb

## 2021-04-03 DIAGNOSIS — G5702 Lesion of sciatic nerve, left lower limb: Secondary | ICD-10-CM

## 2021-04-03 DIAGNOSIS — I779 Disorder of arteries and arterioles, unspecified: Secondary | ICD-10-CM | POA: Diagnosis not present

## 2021-04-03 DIAGNOSIS — Z77120 Contact with and (suspected) exposure to mold (toxic): Secondary | ICD-10-CM

## 2021-04-03 MED ORDER — GABAPENTIN 800 MG PO TABS: 800.0000 mg | ORAL_TABLET | Freq: Three times a day (TID) | ORAL | 3 refills | Status: DC

## 2021-04-03 NOTE — Progress Notes (Signed)
Reason for visit: Left sciatic neuropathy  Beth Daniels is an 75 y.o. female  History of present illness:  Beth Daniels is a 75 year old right-handed white female with history of left leg pain following a hip procedure for fracture on the left.  The patient indicates that she is sensitive to pressure on the buttocks.  If she sits on a padded seat or takes the pressure off the left buttocks that discomfort down the left leg will improve.  She sleeps at night on a cushion that seem to help.  The patient is on gabapentin taking 700 mg 3 times daily with some benefit but she thinks that she may need more.  In the past, physical therapy was of no benefit for the leg pain.  She walks with a walker, she is somewhat limited in how far she can walk because of her breathing issues.  She is having problems with mold exposure in the home environment.  She returns to the office today for an evaluation.  Past Medical History:  Diagnosis Date   Alcohol abuse    Anemia    Cancer ([place])    breast right   Chronic hyponatremia    COPD (chronic obstructive pulmonary disease) ([facility])    DCIS (ductal carcinoma in situ)    Dyspnea    Emphysema of lung ([facility])    Osteoporosis    Overactive bladder    PVD (peripheral vascular disease) ([place])    Tobacco use     Past Surgical History:  Procedure Laterality Date   ABDOMINAL AORTOGRAM W/LOWER EXTREMITY N/A 03/15/2019   Procedure: ABDOMINAL AORTOGRAM W/LOWER EXTREMITY;  Surgeon: Serafina Mitchell, MD;  Location: [facility];  Service: Cardiovascular;  Laterality: N/A;   aeptoplasty     BREAST SURGERY     BRONCHIAL BRUSHINGS  02/12/2021   Procedure: BRONCHIAL BRUSHINGS;  Surgeon: Garner Nash, DO;  Location: [facility];  Service: Pulmonary;;   BRONCHIAL NEEDLE ASPIRATION BIOPSY  02/12/2021   Procedure: BRONCHIAL NEEDLE ASPIRATION BIOPSIES;  Surgeon: Garner Nash, DO;  Location: [facility];  Service: Pulmonary;;   cartaract extraction     EYE SURGERY  Bilateral    Cataract in R & L eye and Retina surgery after cataract surgery in L eye   FIDUCIAL MARKER PLACEMENT  02/12/2021   Procedure: FIDUCIAL MARKER PLACEMENT;  Surgeon: Garner Nash, DO;  Location: [facility];  Service: Pulmonary;;   INTRAMEDULLARY (IM) NAIL INTERTROCHANTERIC Left 01/30/2019   Procedure: INTRAMEDULLARY (IM) NAIL INTERTROCHANTRIC;  Surgeon: Frederik Pear, MD;  Location: [facility];  Service: Orthopedics;  Laterality: Left;   IR FIBRIN GLUE REPAIR ANAL FISTULA     MASTECTOMY     s/p bl   PERIPHERAL VASCULAR INTERVENTION  03/15/2019   Procedure: PERIPHERAL VASCULAR INTERVENTION;  Surgeon: Serafina Mitchell, MD;  Location: [facility];  Service: Cardiovascular;;  Lt. SFA   PERIPHERAL VASCULAR THROMBECTOMY  03/15/2019   Procedure: PERIPHERAL VASCULAR THROMBECTOMY;  Surgeon: Serafina Mitchell, MD;  Location: [facility];  Service: Cardiovascular;;  Lt. AT, PT   RETINAL DETACHMENT SURGERY     sclerotherapy vein leg     VASCULAR SURGERY     VIDEO BRONCHOSCOPY WITH ENDOBRONCHIAL NAVIGATION Bilateral 02/12/2021   Procedure: VIDEO BRONCHOSCOPY WITH ENDOBRONCHIAL NAVIGATION;  Surgeon: Garner Nash, DO;  Location: [facility];  Service: Pulmonary;  Laterality: Bilateral;  ION   VIDEO BRONCHOSCOPY WITH RADIAL ENDOBRONCHIAL ULTRASOUND  02/12/2021   Procedure: RADIAL ENDOBRONCHIAL ULTRASOUND;  Surgeon: Garner Nash, DO;  Location: [facility];  Service: Pulmonary;;   vitreous retinal surgery      Family History  Problem Relation Age of Onset   Alzheimer's disease Mother    Pneumonia Father        aspiration pneumonia    Social history:  reports that she quit smoking about 4 years ago. Her smoking use included cigarettes. She started smoking about 52 years ago. She has a 75.00 pack-year smoking history. She has never used smokeless tobacco. She reports current alcohol use of about 28.0 standard drinks per week. She reports that she does not currently use  drugs.   No Known Allergies  Medications:  Prior to Admission medications   Medication Sig Start Date End Date Taking? Authorizing Provider  albuterol (PROVENTIL) (2.5 MG/3ML) 0.083% nebulizer solution Take 3 mLs (2.5 mg total) by nebulization every 4 (four) hours as needed for wheezing or shortness of breath. 07/21/19   Olalere, Cicero Duck A, MD  ANORO ELLIPTA 62.5-25 MCG/INH AEPB USE 1 INHALATION DAILY Patient taking differently: Inhale 1 puff into the lungs daily. 09/17/20   Sherrilyn Rist A, MD  aspirin EC 81 MG tablet Take 81 mg by mouth daily.    [provider]  atorvastatin (LIPITOR) 20 MG tablet Take 20 mg by mouth at bedtime.  02/15/18 02/12/21  [provider]  atorvastatin (LIPITOR) 20 MG tablet Take 20 mg by mouth daily.    [provider]  azelastine (ASTELIN) 0.1 % nasal spray USE 1 SPRAY IN EACH NOSTRIL TWICE A DAY AS DIRECTED Patient taking differently: Place 1 spray into both nostrils 2 (two) times daily. 11/13/20   Laurin Coder, MD  Azelastine-Fluticasone 137-50 MCG/ACT SUSP Place 1 spray into the nose daily. Patient taking differently: Place 1 spray into the nose 2 (two) times daily. 12/21/19   Laurin Coder, MD  conjugated estrogens (PREMARIN) vaginal cream Place 1 Applicatorful vaginally every Monday, Wednesday, and Friday at 8 PM. 12/20/18   [provider]  Cranberry 400 MG CAPS Take 1,200 mg by mouth daily at 2 PM.    [provider]  denosumab (PROLIA) 60 MG/ML SOSY injection Inject 60 mg into the skin every 6 (six) months.     [provider]  docusate sodium (COLACE) 100 MG capsule Take 1 capsule (100 mg total) by mouth 2 (two) times daily. Patient taking differently: Take 100 mg by mouth at bedtime. 02/01/19   Manuella Ghazi, Pratik D, DO  Ferrous Fumarate (HEMOCYTE - 106 MG FE) 324 (106 Fe) MG TABS tablet Take 1 tablet by mouth daily at 3 pm.  02/09/18   [provider]  fluticasone (FLONASE) 50 MCG/ACT nasal  spray Place 2 sprays into both nostrils 2 (two) times daily. 01/25/21   Sherrilyn Rist A, MD  folic acid (FOLVITE) 1 MG tablet Take 1 mg by mouth daily.    [provider]  gabapentin (NEURONTIN) 100 MG capsule Take 1 capsule (100 mg total) by mouth 3 (three) times daily as needed. Patient taking differently: Take 100 mg by mouth daily at 12 noon. 3pm 01/02/21   Kathrynn Ducking, MD  gabapentin (NEURONTIN) 300 MG capsule Take 2 capsules (600 mg total) by mouth 3 (three) times daily. 01/02/21   Kathrynn Ducking, MD  guaiFENesin (MUCINEX) 600 MG 12 hr tablet Take 600 mg by mouth 2 (two) times daily.    [provider]  hydrocortisone 2.5 % cream Apply 1 application topically daily. 01/29/21  [provider]  Multiple Vitamin (MULTI-VITAMIN) tablet Take 1 tablet by mouth daily.     [provider]  mupirocin ointment (BACTROBAN) 2 % Apply 1 application topically daily as needed (wound care).  08/28/16   [provider]  MYRBETRIQ 50 MG TB24 tablet Take 50 mg by mouth at bedtime. 07/12/20   [provider]  nicotine polacrilex (COMMIT) 4 MG lozenge Take 4 mg by mouth as needed for smoking cessation.  09/10/16   [provider]  OXYGEN Inhale 2 L into the lungs continuous.    [provider]  pantoprazole (PROTONIX) 40 MG tablet Take 40 mg by mouth daily. 12/01/18   [provider]  PROAIR HFA 108 (90 Base) MCG/ACT inhaler USE 1 INHALATION EVERY 6 HOURS AS NEEDED FOR WHEEZING OR SHORTNESS OF BREATH Patient taking differently: Inhale 1 puff into the lungs 2 (two) times daily. As needed during the day 06/05/20   Sherrilyn Rist A, MD  sodium chloride (OCEAN) 0.65 % SOLN nasal spray Place 1 spray into both nostrils 2 (two) times daily. As needed during the day    [provider]  Vitamin D, Cholecalciferol, 25 MCG (1000 UT) TABS Take 1,000 Units by mouth daily.    [provider]    ROS:  Out of a complete 14  system review of symptoms, the patient complains only of the following symptoms, and all other reviewed systems are negative.  Shortness of breath Walking difficulty Left leg pain  Height 5\' 2"  (1.575 m), weight 94 lb 9.6 oz (42.9 kg).  Physical Exam  General: The patient is alert and cooperative at the time of the examination.  Skin: No significant peripheral edema is noted.   Neurologic Exam  Mental status: The patient is alert and oriented x 3 at the time of the examination. The patient has apparent normal recent and remote memory, with an apparently normal attention span and concentration ability.   Cranial nerves: Facial symmetry is present. Speech is normal, no aphasia or dysarthria is noted. Extraocular movements are full. Visual fields are full.  Motor: The patient has good strength in all 4 extremities.  Sensory examination: Soft touch sensation is symmetric on the face, arms, and legs.  Coordination: The patient has good finger-nose-finger and heel-to-shin bilaterally.  Gait and station: The patient has a slightly wide-based gait, she uses a walker to get around.  With a walker she has good stride and good stability.  Tandem gait was not attempted.  Romberg is negative.  Reflexes: Deep tendon reflexes are symmetric.   Assessment/Plan:  1.  Left sciatic neuropathy  The patient will be increased on the gabapentin taking 800 mg 3 times daily.  She will try to alleviate pressure on the left buttocks to help with pain as much as possible.  She will follow-up here in 6 months, in the future she can be followed through Dr. Krista Blue.  Jill Alexanders MD 04/03/2021 2:37 PM  [facility] [address] [place], [place] [address]  Phone [phone] Fax [phone]

## 2021-04-09 LAB — HYPERSENSITIVITY PNEUMONITIS
A. Pullulans Abs: NEGATIVE
A.Fumigatus #1 Abs: NEGATIVE
Micropolyspora faeni, IgG: NEGATIVE
Pigeon Serum Abs: NEGATIVE
Thermoact. Saccharii: NEGATIVE
Thermoactinomyces vulgaris, IgG: NEGATIVE

## 2021-04-09 NOTE — Telephone Encounter (Signed)
Results for hypersensitivity are negative  Nothing else to do or check for at present  Goal is still to avoid further exposure

## 2021-04-29 ENCOUNTER — Other Ambulatory Visit: Payer: Self-pay | Admitting: Neurology

## 2021-04-29 MED ORDER — GABAPENTIN 300 MG PO CAPS: 600.0000 mg | ORAL_CAPSULE | Freq: Three times a day (TID) | ORAL | 3 refills | Status: DC

## 2021-04-29 MED ORDER — GABAPENTIN 100 MG PO CAPS: 200.0000 mg | ORAL_CAPSULE | Freq: Three times a day (TID) | ORAL | 3 refills | Status: DC

## 2021-05-09 ENCOUNTER — Other Ambulatory Visit: Payer: Self-pay | Admitting: *Deleted

## 2021-05-09 MED ORDER — FLUTICASONE PROPIONATE 50 MCG/ACT NA SUSP: 2.0000 | Freq: Two times a day (BID) | NASAL | 3 refills | Status: DC

## 2021-05-17 ENCOUNTER — Ambulatory Visit
Admission: RE | Admit: 2021-05-17 | Discharge: 2021-05-17 | Disposition: A | Discharge status: Home / Self Care | Payer: Medicare Other | Source: Ambulatory Visit | Attending: Pulmonary Disease | Admitting: Pulmonary Disease

## 2021-05-17 ENCOUNTER — Other Ambulatory Visit: Payer: Self-pay

## 2021-05-17 DIAGNOSIS — R918 Other nonspecific abnormal finding of lung field: Secondary | ICD-10-CM

## 2021-05-22 ENCOUNTER — Ambulatory Visit (INDEPENDENT_AMBULATORY_CARE_PROVIDER_SITE_OTHER): Payer: Medicare Other | Admitting: Pulmonary Disease

## 2021-05-22 ENCOUNTER — Other Ambulatory Visit: Payer: Self-pay

## 2021-05-22 ENCOUNTER — Encounter: Payer: Self-pay | Admitting: Pulmonary Disease

## 2021-05-22 VITALS — BP 128/60 | HR 91 | Temp 98.2°F | Ht 62.0 in | Wt 93.0 lb

## 2021-05-22 DIAGNOSIS — R9389 Abnormal findings on diagnostic imaging of other specified body structures: Secondary | ICD-10-CM | POA: Diagnosis not present

## 2021-05-22 DIAGNOSIS — R918 Other nonspecific abnormal finding of lung field: Secondary | ICD-10-CM

## 2021-05-22 DIAGNOSIS — J449 Chronic obstructive pulmonary disease, unspecified: Secondary | ICD-10-CM | POA: Diagnosis not present

## 2021-05-22 DIAGNOSIS — J9611 Chronic respiratory failure with hypoxia: Secondary | ICD-10-CM | POA: Diagnosis not present

## 2021-05-22 NOTE — Patient Instructions (Signed)
Thank you for visiting Dr. Valeta Harms at [facility]. Today we recommend the following:  Orders Placed This Encounter  Procedures   CT Super D Chest Wo Contrast   Return in about 3 months (around 08/22/2021) for w/ Dr. Valeta Harms .    Please do your part to reduce the spread of COVID-19.

## 2021-05-22 NOTE — Progress Notes (Signed)
Synopsis: Referred in July 2022 for lung nodule by Shon Baton, MD  Subjective:   PATIENT ID: Beth Daniels GENDER: female DOB: 11/17/1945, MRN: [id number]  Chief Complaint  Patient presents with   Follow-up    This is a 75 year old female, past medical history of breast cancer on the right 9 years ago status post bilateral mastectomy, severe emphysema, chronic hypoxemic respiratory failure on 2 L nasal cannula.  Patient has had multiple CT images over the past 2+ years.  Has shown interval growth of at least 3 nodules.  She has a right upper lobe anterior nodule that has grown in size by at least 1 to 2 mm as well as a right lower lobe superior segment lesion that has grown.  And she has a left lung nodule that has grown from 4 mm to 7 mm.  From a respiratory standpoint she has been stable on her 2 L nasal cannula and compliant with her inhaler regimen.  OV 02/28/2021: Here today for follow-up regarding bronchoscopy.  All lung nodules that were sampled during bronchoscopy had no evidence of malignancy.  All were in difficult locations that are not sure that we were in the space.  We went over the imaging today in the office and explained the diagnostic yield again regarding small peripheral lesions and the utility of navigational bronchoscopy.  Most of her lesions 1 of which was in a very precarious location to access.  The right lower lobe superior segment lesion would be the 1 of which I am most concerned of being a potential malignancy due to its lobulated shape.  We also discussed this in detail.  OV 05/22/2021: Here today for follow-up after recent CT scan of the chest.  We spent a significant amount of time today discussing each of the nodules in her chest.  As well as the ones on the left side that I believe have a higher chance at having being malignant.  We also reviewed once again the details of the previous navigational bronchoscopy and the various precarious locations in which they exist.   Also discussed that the lingular nodule on the left side is subsolid in nature and has enlarged just a small amount.  I do believe that this potentially is one of the highest risks as this did show change in 3 months but only a minimal alteration in its previous size.   Past Medical History:  Diagnosis Date   Alcohol abuse    Anemia    Cancer ([place])    breast right   Chronic hyponatremia    COPD (chronic obstructive pulmonary disease) (HCC)    DCIS (ductal carcinoma in situ)    Dyspnea    Emphysema of lung ([facility])    Osteoporosis    Overactive bladder    PVD (peripheral vascular disease) ([place])    Tobacco use      Family History  Problem Relation Age of Onset   Alzheimer's disease Mother    Pneumonia Father        aspiration pneumonia     Past Surgical History:  Procedure Laterality Date   ABDOMINAL AORTOGRAM W/LOWER EXTREMITY N/A 03/15/2019   Procedure: ABDOMINAL AORTOGRAM W/LOWER EXTREMITY;  Surgeon: Serafina Mitchell, MD;  Location: [facility];  Service: Cardiovascular;  Laterality: N/A;   aeptoplasty     BREAST SURGERY     BRONCHIAL BRUSHINGS  02/12/2021   Procedure: BRONCHIAL BRUSHINGS;  Surgeon: Garner Nash, DO;  Location: [facility];  Service:  Pulmonary;;   BRONCHIAL NEEDLE ASPIRATION BIOPSY  02/12/2021   Procedure: BRONCHIAL NEEDLE ASPIRATION BIOPSIES;  Surgeon: Garner Nash, DO;  Location: [facility];  Service: Pulmonary;;   cartaract extraction     EYE SURGERY Bilateral    Cataract in R & L eye and Retina surgery after cataract surgery in L eye   FIDUCIAL MARKER PLACEMENT  02/12/2021   Procedure: FIDUCIAL MARKER PLACEMENT;  Surgeon: Garner Nash, DO;  Location: [facility];  Service: Pulmonary;;   INTRAMEDULLARY (IM) NAIL INTERTROCHANTERIC Left 01/30/2019   Procedure: INTRAMEDULLARY (IM) NAIL INTERTROCHANTRIC;  Surgeon: Frederik Pear, MD;  Location: [facility];  Service: Orthopedics;  Laterality: Left;   IR FIBRIN GLUE REPAIR ANAL FISTULA     MASTECTOMY      s/p bl   PERIPHERAL VASCULAR INTERVENTION  03/15/2019   Procedure: PERIPHERAL VASCULAR INTERVENTION;  Surgeon: Serafina Mitchell, MD;  Location: [facility];  Service: Cardiovascular;;  Lt. SFA   PERIPHERAL VASCULAR THROMBECTOMY  03/15/2019   Procedure: PERIPHERAL VASCULAR THROMBECTOMY;  Surgeon: Serafina Mitchell, MD;  Location: [facility];  Service: Cardiovascular;;  Lt. AT, PT   RETINAL DETACHMENT SURGERY     sclerotherapy vein leg     VASCULAR SURGERY     VIDEO BRONCHOSCOPY WITH ENDOBRONCHIAL NAVIGATION Bilateral 02/12/2021   Procedure: VIDEO BRONCHOSCOPY WITH ENDOBRONCHIAL NAVIGATION;  Surgeon: Garner Nash, DO;  Location: [facility];  Service: Pulmonary;  Laterality: Bilateral;  ION   VIDEO BRONCHOSCOPY WITH RADIAL ENDOBRONCHIAL ULTRASOUND  02/12/2021   Procedure: RADIAL ENDOBRONCHIAL ULTRASOUND;  Surgeon: Garner Nash, DO;  Location: [facility];  Service: Pulmonary;;   vitreous retinal surgery      Social History   Socioeconomic History   Marital status: Married    Spouse name: Elenore Rota   Number of children: Not on file   Years of education: Not on file   Highest education level: Not on file  Occupational History   Occupation: RETIRED  Tobacco Use   Smoking status: Former    Packs/day: 1.50    Years: 50.00    Pack years: 75.00    Types: Cigarettes    Start date: 1970    Quit date: 10/06/2016    Years since quitting: 4.6   Smokeless tobacco: Never  Vaping Use   Vaping Use: Never used  Substance and Sexual Activity   Alcohol use: Yes    Alcohol/week: 28.0 standard drinks    Types: 28 Glasses of wine per week    Comment: 4 glasses wine a night   Drug use: Not Currently   Sexual activity: Not Currently  Other Topics Concern   Not on file  Social History Narrative   Right handed,    Lives with husband   Drinks 2 cups caffeine daily   Social Determinants of Health   Financial Resource Strain: Not on file  Food Insecurity: Not on file   Transportation Needs: Not on file  Physical Activity: Not on file  Stress: Not on file  Social Connections: Not on file  Intimate Partner Violence: Not on file     No Known Allergies   Outpatient Medications Prior to Visit  Medication Sig Dispense Refill   albuterol (PROVENTIL) (2.5 MG/3ML) 0.083% nebulizer solution Take 3 mLs (2.5 mg total) by nebulization every 4 (four) hours as needed for wheezing or shortness of breath. 75 mL 3   ANORO ELLIPTA 62.5-25 MCG/INH AEPB USE 1 INHALATION DAILY (Patient taking differently: Inhale 1 puff into the  lungs daily.) 180 each 3   aspirin EC 81 MG tablet Take 81 mg by mouth daily.     azelastine (ASTELIN) 0.1 % nasal spray USE 1 SPRAY IN EACH NOSTRIL TWICE A DAY AS DIRECTED (Patient taking differently: Place 1 spray into both nostrils 2 (two) times daily.) 30 mL 6   Azelastine-Fluticasone 137-50 MCG/ACT SUSP Place 1 spray into the nose daily. (Patient taking differently: Place 1 spray into the nose 2 (two) times daily.) 23 g 6   conjugated estrogens (PREMARIN) vaginal cream Place 1 Applicatorful vaginally every Monday, Wednesday, and Friday at 8 PM.     Cranberry 400 MG CAPS Take 1,200 mg by mouth daily at 2 PM.     denosumab (PROLIA) 60 MG/ML SOSY injection Inject 60 mg into the skin every 6 (six) months.      docusate sodium (COLACE) 100 MG capsule Take 1 capsule (100 mg total) by mouth 2 (two) times daily. (Patient taking differently: Take 100 mg by mouth at bedtime.) 10 capsule 0   Ferrous Fumarate (HEMOCYTE - 106 MG FE) 324 (106 Fe) MG TABS tablet Take 1 tablet by mouth daily at 3 pm.      fluticasone (FLONASE) 50 MCG/ACT nasal spray Place 2 sprays into both nostrils 2 (two) times daily. 16 mL 3   folic acid (FOLVITE) 1 MG tablet Take 1 mg by mouth daily.     gabapentin (NEURONTIN) 100 MG capsule Take 2 capsules (200 mg total) by mouth 3 (three) times daily. 540 capsule 3   gabapentin (NEURONTIN) 300 MG capsule Take 2 capsules (600 mg total) by  mouth 3 (three) times daily. 540 capsule 3   guaiFENesin (MUCINEX) 600 MG 12 hr tablet Take 600 mg by mouth 2 (two) times daily.     hydrocortisone 2.5 % cream Apply 1 application topically daily.     Multiple Vitamin (MULTI-VITAMIN) tablet Take 1 tablet by mouth daily.      mupirocin ointment (BACTROBAN) 2 % Apply 1 application topically daily as needed (wound care).      MYRBETRIQ 50 MG TB24 tablet Take 50 mg by mouth at bedtime.     nicotine polacrilex (COMMIT) 4 MG lozenge Take 4 mg by mouth as needed for smoking cessation.      OXYGEN Inhale 2 L into the lungs continuous.     pantoprazole (PROTONIX) 40 MG tablet Take 40 mg by mouth daily.     PROAIR HFA 108 (90 Base) MCG/ACT inhaler USE 1 INHALATION EVERY 6 HOURS AS NEEDED FOR WHEEZING OR SHORTNESS OF BREATH (Patient taking differently: Inhale 1 puff into the lungs 2 (two) times daily. As needed during the day) 17 g 3   sodium chloride (OCEAN) 0.65 % SOLN nasal spray Place 1 spray into both nostrils 2 (two) times daily. As needed during the day     Vitamin D, Cholecalciferol, 25 MCG (1000 UT) TABS Take 1,000 Units by mouth daily.     atorvastatin (LIPITOR) 20 MG tablet Take 20 mg by mouth at bedtime.      No facility-administered medications prior to visit.    Review of Systems  Constitutional:  Negative for chills, fever, malaise/fatigue and weight loss.  HENT:  Negative for hearing loss, sore throat and tinnitus.   Eyes:  Negative for blurred vision and double vision.  Respiratory:  Positive for shortness of breath. Negative for cough, hemoptysis, sputum production, wheezing and stridor.   Cardiovascular:  Negative for chest pain, palpitations, orthopnea, leg swelling and PND.  Gastrointestinal:  Negative for abdominal pain, constipation, diarrhea, heartburn, nausea and vomiting.  Genitourinary:  Negative for dysuria, hematuria and urgency.  Musculoskeletal:  Negative for joint pain and myalgias.  Skin:  Negative for itching and  rash.  Neurological:  Negative for dizziness, tingling, weakness and headaches.  Endo/Heme/Allergies:  Negative for environmental allergies. Does not bruise/bleed easily.  Psychiatric/Behavioral:  Negative for depression. The patient is not nervous/anxious and does not have insomnia.   All other systems reviewed and are negative.   Objective:  Physical Exam Vitals reviewed.  Constitutional:      General: She is not in acute distress.    Appearance: She is well-developed.     Comments: Elderly, frail thin  HENT:     Head: Normocephalic and atraumatic.     Mouth/Throat:     Pharynx: No oropharyngeal exudate.  Eyes:     Conjunctiva/sclera: Conjunctivae normal.     Pupils: Pupils are equal, round, and reactive to light.  Neck:     Vascular: No JVD.     Trachea: No tracheal deviation.     Comments: Loss of supraclavicular fat Cardiovascular:     Rate and Rhythm: Normal rate and regular rhythm.     Heart sounds: S1 normal and S2 normal.     Comments: Distant heart tones Pulmonary:     Effort: No tachypnea or accessory muscle usage.     Breath sounds: No stridor. Decreased breath sounds (throughout all lung fields) present. No wheezing, rhonchi or rales.  Abdominal:     General: Bowel sounds are normal. There is no distension.     Palpations: Abdomen is soft.     Tenderness: There is no abdominal tenderness.  Musculoskeletal:        General: Deformity (muscle wasting ) present.  Skin:    General: Skin is warm and dry.     Capillary Refill: Capillary refill takes less than 2 seconds.     Findings: No rash.  Neurological:     Mental Status: She is alert and oriented to person, place, and time.  Psychiatric:        Behavior: Behavior normal.     Vitals:   05/22/21 1501  BP: 128/60  Pulse: 91  Temp: 98.2 F (36.8 C)  TempSrc: Oral  SpO2: 95%  Weight: 93 lb (42.2 kg)  Height: 5\' 2"  (1.575 m)   95% on RA BMI Readings from Last 3 Encounters:  05/22/21 17.01 kg/m   04/03/21 17.30 kg/m  02/28/21 17.12 kg/m   Wt Readings from Last 3 Encounters:  05/22/21 93 lb (42.2 kg)  04/03/21 94 lb 9.6 oz (42.9 kg)  02/28/21 93 lb 9.6 oz (42.5 kg)     CBC    Component Value Date/Time   WBC 3.8 (L) 02/12/2021 0628   RBC 3.53 (L) 02/12/2021 0628   HGB 12.0 02/12/2021 0628   HCT 36.4 02/12/2021 0628   PLT 201 02/12/2021 0628   MCV 103.1 (H) 02/12/2021 0628   MCH 34.0 02/12/2021 0628   MCHC 33.0 02/12/2021 0628   RDW 12.9 02/12/2021 0628   LYMPHSABS 0.8 01/29/2019 1354   MONOABS 0.3 01/29/2019 1354   EOSABS 0.0 01/29/2019 1354   BASOSABS 0.0 01/29/2019 1354     Chest Imaging: 01/09/2021 CT chest: History of breast cancer, now has nodule surveillance with multiple small pulmonary nodules that have increased in size.  Right lower lobe superior segment now 10 mm, right upper lobe 9 mm lesion as well as a lingular semisolid  lesion that was 4 mm and now 7 years.  Several multiple small subsolid lesions and solid lesions that have large.  The suprasellar lesion is macrolobulated, the right upper lobe anterior lesion is smoother bordered.  We may be dealing with metachronous primary lung cancers due to the patient's history of smoking and severe emphysema. The patient's images have been independently reviewed by me.  02/05/2021 CT chest: Stable pulmonary nodules in comparison to July CT.  Lobulated superior segment lesion.  05/17/2021 CT chest super D: Multiple pulmonary nodules some subsolid in nature some rounded smooth, 1 macrolobulated.  Only one that really changes in the left lingula in comparison to the others.  I think the left-sided nodules are the 2 of which that are likely the highest risk of being malignant. The patient's images have been independently reviewed by me.    Pulmonary Functions Testing Results: No flowsheet data found.  FeNO:   Pathology:   Echocardiogram:   Heart Catheterization:     Assessment & Plan:     ICD-10-CM   1.  Lung nodules  R91.8 CT Super D Chest Wo Contrast    2. Multiple lung nodules on CT  R91.8     3. Chronic obstructive pulmonary disease, unspecified COPD type ([place])  J44.9     4. Abnormal CT of the chest  R93.89     5. Chronic hypoxemic respiratory failure ([facility])  J96.11       Discussion:  This is a 75 year old female, a slowly enlarging multiple pulmonary nodules.  They are all various morphologies.  She has a few smooth nodule like the one located in the right upper lobe.  She has 1 in the right lower lobe superior segment that is macrolobulated.  Both of these were attempted navigational bronchoscopy this past summer that were nondiagnostic.  She also had a left lower lobe lesion that was attempted also nondiagnostic all of these are subcentimeter in size.  The 1 that has changed however on CT was one within the lingula that was subsolid in nature seen on initial imaging and this has grown some but only by few millimeters.  Plan: We discussed today in the office all of the potential risks of undergoing repeat procedure and general anesthesia with the setting of advanced lung disease and chronic hypoxemic respiratory failure. Patient's husband had several questions we also answered. We discussed the utility of waiting with repeat imaging versus going for a repeat procedure. She also read article that was recently in the paper and asked about undergoing single anesthetic event with resection I explained with the patient's current functional status BMI of 17 and chronic hypoxemic respiratory failure that the likelihood of survival after surgery was small.  I think that is too high risk for her to undergo any type of thoracic surgery. We settled on having a repeat noncontrasted CT scan of the chest in 3 months. If there is any growth of the nodules whatsoever the next 3 months she would like to proceed with repeat bronchoscopy and biopsy.    Current Outpatient Medications:    albuterol  (PROVENTIL) (2.5 MG/3ML) 0.083% nebulizer solution, Take 3 mLs (2.5 mg total) by nebulization every 4 (four) hours as needed for wheezing or shortness of breath., Disp: 75 mL, Rfl: 3   ANORO ELLIPTA 62.5-25 MCG/INH AEPB, USE 1 INHALATION DAILY (Patient taking differently: Inhale 1 puff into the lungs daily.), Disp: 180 each, Rfl: 3   aspirin EC 81 MG tablet, Take 81 mg by mouth  daily., Disp: , Rfl:    azelastine (ASTELIN) 0.1 % nasal spray, USE 1 SPRAY IN EACH NOSTRIL TWICE A DAY AS DIRECTED (Patient taking differently: Place 1 spray into both nostrils 2 (two) times daily.), Disp: 30 mL, Rfl: 6   Azelastine-Fluticasone 137-50 MCG/ACT SUSP, Place 1 spray into the nose daily. (Patient taking differently: Place 1 spray into the nose 2 (two) times daily.), Disp: 23 g, Rfl: 6   conjugated estrogens (PREMARIN) vaginal cream, Place 1 Applicatorful vaginally every Monday, Wednesday, and Friday at 8 PM., Disp: , Rfl:    Cranberry 400 MG CAPS, Take 1,200 mg by mouth daily at 2 PM., Disp: , Rfl:    denosumab (PROLIA) 60 MG/ML SOSY injection, Inject 60 mg into the skin every 6 (six) months. , Disp: , Rfl:    docusate sodium (COLACE) 100 MG capsule, Take 1 capsule (100 mg total) by mouth 2 (two) times daily. (Patient taking differently: Take 100 mg by mouth at bedtime.), Disp: 10 capsule, Rfl: 0   Ferrous Fumarate (HEMOCYTE - 106 MG FE) 324 (106 Fe) MG TABS tablet, Take 1 tablet by mouth daily at 3 pm. , Disp: , Rfl:    fluticasone (FLONASE) 50 MCG/ACT nasal spray, Place 2 sprays into both nostrils 2 (two) times daily., Disp: 16 mL, Rfl: 3   folic acid (FOLVITE) 1 MG tablet, Take 1 mg by mouth daily., Disp: , Rfl:    gabapentin (NEURONTIN) 100 MG capsule, Take 2 capsules (200 mg total) by mouth 3 (three) times daily., Disp: 540 capsule, Rfl: 3   gabapentin (NEURONTIN) 300 MG capsule, Take 2 capsules (600 mg total) by mouth 3 (three) times daily., Disp: 540 capsule, Rfl: 3   guaiFENesin (MUCINEX) 600 MG 12 hr  tablet, Take 600 mg by mouth 2 (two) times daily., Disp: , Rfl:    hydrocortisone 2.5 % cream, Apply 1 application topically daily., Disp: , Rfl:    Multiple Vitamin (MULTI-VITAMIN) tablet, Take 1 tablet by mouth daily. , Disp: , Rfl:    mupirocin ointment (BACTROBAN) 2 %, Apply 1 application topically daily as needed (wound care). , Disp: , Rfl:    MYRBETRIQ 50 MG TB24 tablet, Take 50 mg by mouth at bedtime., Disp: , Rfl:    nicotine polacrilex (COMMIT) 4 MG lozenge, Take 4 mg by mouth as needed for smoking cessation. , Disp: , Rfl:    OXYGEN, Inhale 2 L into the lungs continuous., Disp: , Rfl:    pantoprazole (PROTONIX) 40 MG tablet, Take 40 mg by mouth daily., Disp: , Rfl:    PROAIR HFA 108 (90 Base) MCG/ACT inhaler, USE 1 INHALATION EVERY 6 HOURS AS NEEDED FOR WHEEZING OR SHORTNESS OF BREATH (Patient taking differently: Inhale 1 puff into the lungs 2 (two) times daily. As needed during the day), Disp: 17 g, Rfl: 3   sodium chloride (OCEAN) 0.65 % SOLN nasal spray, Place 1 spray into both nostrils 2 (two) times daily. As needed during the day, Disp: , Rfl:    Vitamin D, Cholecalciferol, 25 MCG (1000 UT) TABS, Take 1,000 Units by mouth daily., Disp: , Rfl:    atorvastatin (LIPITOR) 20 MG tablet, Take 20 mg by mouth at bedtime. , Disp: , Rfl:   I spent 42 minutes dedicated to the care of this patient on the date of this encounter to include pre-visit review of records, face-to-face time with the patient discussing conditions above, post visit ordering of testing, clinical documentation with the electronic health record, making appropriate referrals  as documented, and communicating necessary findings to members of the patients care team.    Garner Nash, DO [facility] 05/22/2021 3:05 PM

## 2021-05-23 ENCOUNTER — Ambulatory Visit (HOSPITAL_COMMUNITY)
Admission: RE | Admit: 2021-05-23 | Discharge: 2021-05-23 | Disposition: A | Discharge status: Home / Self Care | Payer: Medicare Other | Source: Ambulatory Visit | Attending: Vascular Surgery | Admitting: Vascular Surgery

## 2021-05-23 ENCOUNTER — Ambulatory Visit (INDEPENDENT_AMBULATORY_CARE_PROVIDER_SITE_OTHER)
Admission: RE | Admit: 2021-05-23 | Discharge: 2021-05-23 | Disposition: A | Discharge status: Home / Self Care | Payer: Medicare Other | Source: Ambulatory Visit | Attending: Vascular Surgery | Admitting: Vascular Surgery

## 2021-05-23 ENCOUNTER — Ambulatory Visit (INDEPENDENT_AMBULATORY_CARE_PROVIDER_SITE_OTHER): Payer: Medicare Other | Admitting: Physician Assistant

## 2021-05-23 VITALS — BP 137/72 | HR 98 | Temp 97.8°F | Resp 20 | Ht 62.0 in | Wt 92.8 lb

## 2021-05-23 DIAGNOSIS — I70213 Atherosclerosis of native arteries of extremities with intermittent claudication, bilateral legs: Secondary | ICD-10-CM | POA: Diagnosis not present

## 2021-05-23 DIAGNOSIS — I779 Disorder of arteries and arterioles, unspecified: Secondary | ICD-10-CM | POA: Diagnosis present

## 2021-05-23 NOTE — Progress Notes (Signed)
Office Note     CC:  follow up Requesting Provider:  Shon Baton, MD  HPI: Beth Daniels is a 75 y.o. (1946/04/17) female who presents to go over vascular studies related to PAD.  She had previously undergone multiple percutaneous interventions at [facility].  She underwent left superficial femoral artery stenting April 2018.  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  She subsequently had drug-coated balloon angioplasty for in-stent restenosis in March and again in November 2019 with cutting Balloon angioplasty of the left SFA and balloon angioplasty of the right external iliac artery.  She then underwent drug-eluting stent placement of left SFA due to in-stent stenosis with mechanical thrombectomy of left ATA and PTA by Dr. Trula Slade in September 2020.  She has constant neuropathy of bilateral lower extremities however denies any wounds.  She also denies any rest pain.  She is not very mobile admittedly however is able to walk around her house with a Rollator.  She is also currently being worked up for enlarging lung nodules.  She has had bronchoscopy with lung biopsy that was inconclusive however has a repeat CT scan 3 months from now.  She is on 24/7 home oxygen.   Past Medical History:  Diagnosis Date   Alcohol abuse    Anemia    Cancer ([place])    breast right   Chronic hyponatremia    COPD (chronic obstructive pulmonary disease) (H[facility])    DCIS (ductal carcinoma in situ)    Dyspnea    Emphysema of lung (H[facility])    Osteoporosis    Overactive bladder    PVD (peripheral vascular disease) ([place])    Tobacco use     Past Surgical History:  Procedure Laterality Date   ABDOMINAL AORTOGRAM W/LOWER EXTREMITY N/A 03/15/2019   Procedure: ABDOMINAL AORTOGRAM W/LOWER EXTREMITY;  Surgeon: Serafina Mitchell, MD;  Location: [facility];  Service: Cardiovascular;  Laterality: N/A;   aeptoplasty     BREAST SURGERY     BRONCHIAL BRUSHINGS  02/12/2021    Procedure: BRONCHIAL BRUSHINGS;  Surgeon: Garner Nash, DO;  Location: [facility];  Service: Pulmonary;;   BRONCHIAL NEEDLE ASPIRATION BIOPSY  02/12/2021   Procedure: BRONCHIAL NEEDLE ASPIRATION BIOPSIES;  Surgeon: Garner Nash, DO;  Location: [facility];  Service: Pulmonary;;   cartaract extraction     EYE SURGERY Bilateral    Cataract in R & L eye and Retina surgery after cataract surgery in L eye   FIDUCIAL MARKER PLACEMENT  02/12/2021   Procedure: FIDUCIAL MARKER PLACEMENT;  Surgeon: Garner Nash, DO;  Location: [facility];  Service: Pulmonary;;   INTRAMEDULLARY (IM) NAIL INTERTROCHANTERIC Left 01/30/2019   Procedure: INTRAMEDULLARY (IM) NAIL INTERTROCHANTRIC;  Surgeon: Frederik Pear, MD;  Location: [facility];  Service: Orthopedics;  Laterality: Left;   IR FIBRIN GLUE REPAIR ANAL FISTULA     MASTECTOMY     s/p bl   PERIPHERAL VASCULAR INTERVENTION  03/15/2019   Procedure: PERIPHERAL VASCULAR INTERVENTION;  Surgeon: Serafina Mitchell, MD;  Location: [facility];  Service: Cardiovascular;;  Lt. SFA   PERIPHERAL VASCULAR THROMBECTOMY  03/15/2019   Procedure: PERIPHERAL VASCULAR THROMBECTOMY;  Surgeon: Serafina Mitchell, MD;  Location: [facility];  Service: Cardiovascular;;  Lt. AT, PT   RETINAL DETACHMENT SURGERY     sclerotherapy vein leg     VASCULAR SURGERY     VIDEO BRONCHOSCOPY WITH ENDOBRONCHIAL NAVIGATION Bilateral 02/12/2021   Procedure: VIDEO BRONCHOSCOPY WITH  ENDOBRONCHIAL NAVIGATION;  Surgeon: Garner Nash, DO;  Location: [facility];  Service: Pulmonary;  Laterality: Bilateral;  ION   VIDEO BRONCHOSCOPY WITH RADIAL ENDOBRONCHIAL ULTRASOUND  02/12/2021   Procedure: RADIAL ENDOBRONCHIAL ULTRASOUND;  Surgeon: Garner Nash, DO;  Location: [facility];  Service: Pulmonary;;   vitreous retinal surgery      Social History   Socioeconomic History   Marital status: Married    Spouse name: Elenore Rota   Number of children: Not on file   Years of education:  Not on file   Highest education level: Not on file  Occupational History   Occupation: RETIRED  Tobacco Use   Smoking status: Former    Packs/day: 1.50    Years: 50.00    Pack years: 75.00    Types: Cigarettes    Start date: 1970    Quit date: 10/06/2016    Years since quitting: 4.6   Smokeless tobacco: Never  Vaping Use   Vaping Use: Never used  Substance and Sexual Activity   Alcohol use: Yes    Alcohol/week: 28.0 standard drinks    Types: 28 Glasses of wine per week    Comment: 4 glasses wine a night   Drug use: Not Currently   Sexual activity: Not Currently  Other Topics Concern   Not on file  Social History Narrative   Right handed,    Lives with husband   Drinks 2 cups caffeine daily   Social Determinants of Health   Financial Resource Strain: Not on file  Food Insecurity: Not on file  Transportation Needs: Not on file  Physical Activity: Not on file  Stress: Not on file  Social Connections: Not on file  Intimate Partner Violence: Not on file    Family History  Problem Relation Age of Onset   Alzheimer's disease Mother    Pneumonia Father        aspiration pneumonia    Current Outpatient Medications  Medication Sig Dispense Refill   albuterol (PROVENTIL) (2.5 MG/3ML) 0.083% nebulizer solution Take 3 mLs (2.5 mg total) by nebulization every 4 (four) hours as needed for wheezing or shortness of breath. 75 mL 3   ANORO ELLIPTA 62.5-25 MCG/INH AEPB USE 1 INHALATION DAILY (Patient taking differently: Inhale 1 puff into the lungs daily.) 180 each 3   aspirin EC 81 MG tablet Take 81 mg by mouth daily.     azelastine (ASTELIN) 0.1 % nasal spray USE 1 SPRAY IN EACH NOSTRIL TWICE A DAY AS DIRECTED (Patient taking differently: Place 1 spray into both nostrils 2 (two) times daily.) 30 mL 6   Azelastine-Fluticasone 137-50 MCG/ACT SUSP Place 1 spray into the nose daily. (Patient taking differently: Place 1 spray into the nose 2 (two) times daily.) 23 g 6   conjugated  estrogens (PREMARIN) vaginal cream Place 1 Applicatorful vaginally every Monday, Wednesday, and Friday at 8 PM.     Cranberry 400 MG CAPS Take 1,200 mg by mouth daily at 2 PM.     denosumab (PROLIA) 60 MG/ML SOSY injection Inject 60 mg into the skin every 6 (six) months.      docusate sodium (COLACE) 100 MG capsule Take 1 capsule (100 mg total) by mouth 2 (two) times daily. (Patient taking differently: Take 100 mg by mouth at bedtime.) 10 capsule 0   Ferrous Fumarate (HEMOCYTE - 106 MG FE) 324 (106 Fe) MG TABS tablet Take 1 tablet by mouth daily at 3 pm.      fluticasone (FLONASE) 50 MCG/ACT  nasal spray Place 2 sprays into both nostrils 2 (two) times daily. 16 mL 3   folic acid (FOLVITE) 1 MG tablet Take 1 mg by mouth daily.     gabapentin (NEURONTIN) 100 MG capsule Take 2 capsules (200 mg total) by mouth 3 (three) times daily. 540 capsule 3   gabapentin (NEURONTIN) 300 MG capsule Take 2 capsules (600 mg total) by mouth 3 (three) times daily. 540 capsule 3   guaiFENesin (MUCINEX) 600 MG 12 hr tablet Take 600 mg by mouth 2 (two) times daily.     hydrocortisone 2.5 % cream Apply 1 application topically daily.     Multiple Vitamin (MULTI-VITAMIN) tablet Take 1 tablet by mouth daily.      mupirocin ointment (BACTROBAN) 2 % Apply 1 application topically daily as needed (wound care).      MYRBETRIQ 50 MG TB24 tablet Take 50 mg by mouth at bedtime.     nicotine polacrilex (COMMIT) 4 MG lozenge Take 4 mg by mouth as needed for smoking cessation.      OXYGEN Inhale 2 L into the lungs continuous.     pantoprazole (PROTONIX) 40 MG tablet Take 40 mg by mouth daily.     PROAIR HFA 108 (90 Base) MCG/ACT inhaler USE 1 INHALATION EVERY 6 HOURS AS NEEDED FOR WHEEZING OR SHORTNESS OF BREATH (Patient taking differently: Inhale 1 puff into the lungs 2 (two) times daily. As needed during the day) 17 g 3   sodium chloride (OCEAN) 0.65 % SOLN nasal spray Place 1 spray into both nostrils 2 (two) times daily. As needed  during the day     Vitamin D, Cholecalciferol, 25 MCG (1000 UT) TABS Take 1,000 Units by mouth daily.     atorvastatin (LIPITOR) 20 MG tablet Take 20 mg by mouth at bedtime.  (Patient not taking: Reported on 05/23/2021)     No current facility-administered medications for this visit.    No Known Allergies   REVIEW OF SYSTEMS:   [X]  denotes positive finding, [ ]  denotes negative finding Cardiac  Comments:  Chest pain or chest pressure:    Shortness of breath upon exertion:    Short of breath when lying flat:    Irregular heart rhythm:        Vascular    Pain in calf, thigh, or hip brought on by ambulation:    Pain in feet at night that wakes you up from your sleep:     Blood clot in your veins:    Leg swelling:         Pulmonary    Oxygen at home:    Productive cough:     Wheezing:         Neurologic    Sudden weakness in arms or legs:     Sudden numbness in arms or legs:     Sudden onset of difficulty speaking or slurred speech:    Temporary loss of vision in one eye:     Problems with dizziness:         Gastrointestinal    Blood in stool:     Vomited blood:         Genitourinary    Burning when urinating:     Blood in urine:        Psychiatric    Major depression:         Hematologic    Bleeding problems:    Problems with blood clotting too easily:  Skin    Rashes or ulcers:        Constitutional    Fever or chills:      PHYSICAL EXAMINATION:  Vitals:   05/23/21 1038  BP: 137/72  Pulse: 98  Resp: 20  Temp: 97.8 F (36.6 C)  TempSrc: Temporal  SpO2: 100%  Weight: 92 lb 12.8 oz (42.1 kg)  Height: 5\' 2"  (1.575 m)    General:  WDWN in NAD; vital signs documented above Gait: Not observed HENT: WNL, normocephalic Pulmonary: normal non-labored breathing  Cardiac: regular HR Abdomen: soft, NT, no masses Skin: without rashes Vascular Exam/Pulses:  Right Left  Radial 2+ (normal) 2+ (normal)  DP absent absent  PT absent absent    Extremities: without ischemic changes, without Gangrene , without cellulitis; without open wounds;  Musculoskeletal: no muscle wasting or atrophy  Neurologic: A&O X 3;  No focal weakness or paresthesias are detected Psychiatric:  The pt has Normal affect.   Non-Invasive Vascular Imaging:   Aortoiliac duplex demonstrates mild to moderate stenosis of right common and external iliac  Left lower extremity arterial duplex demonstrates in-stent stenosis of SFA with 390 cm/s velocity  ABI/TBIToday's ABIToday's TBIPrevious ABIPrevious TBI  +-------+-----------+-----------+------------+------------+  Right  0.89       0.49       0.85        0.67          +-------+-----------+-----------+------------+------------+  Left   0.62       0.52       0.90        0.62            ASSESSMENT/PLAN:: 75 y.o. female here for surveillance of PAD  -Patient returns to clinic for surveillance of PAD.  She has had numerous interventions by [facility] vascular as well as Dr. Trula Slade of left SFA.  Arterial duplex again demonstrates in-stent stenosis of proximal left SFA stent.  There is also a drop in left ABI from 0.9-0.6.  Patient states she has constant pins-and-needles feelings of left foot however this has been present for years.  She denies any drastic change in symptoms of her left lower extremity as well as any tissue changes.  Admittedly, patient has not very mobile and is only able to walk around her house with a Rollator.  She is also currently undergoing work-up for enlarging lung nodules.  She has a repeat chest CT in 3 months.  Currently I do not think her left lower extremity is at risk.  Left SFA stent may already be occluded based on drop in ABI.  We discussed signs and symptoms of critical limb ischemia.  We will plan to follow-up with repeat imaging studies after further work-up of her enlarging lung nodules.  She can call/return office sooner with any questions or concerns.  She  will continue her aspirin and statin daily.   Dagoberto Ligas, PA-C [facility] [phone]  Clinic MD:   Scot Dock

## 2021-05-27 ENCOUNTER — Other Ambulatory Visit: Payer: Self-pay

## 2021-05-27 DIAGNOSIS — I70213 Atherosclerosis of native arteries of extremities with intermittent claudication, bilateral legs: Secondary | ICD-10-CM

## 2021-05-31 ENCOUNTER — Other Ambulatory Visit: Payer: Self-pay | Admitting: Pulmonary Disease

## 2021-07-17 ENCOUNTER — Other Ambulatory Visit: Payer: Self-pay | Admitting: Pulmonary Disease

## 2021-07-23 ENCOUNTER — Encounter: Payer: Self-pay | Admitting: Pulmonary Disease

## 2021-07-23 DIAGNOSIS — R195 Other fecal abnormalities: Secondary | ICD-10-CM | POA: Insufficient documentation

## 2021-07-24 MED ORDER — AZELASTINE HCL 0.1 % NA SOLN: 1.0000 | Freq: Two times a day (BID) | NASAL | 6 refills | Status: DC

## 2021-08-09 ENCOUNTER — Ambulatory Visit
Admission: RE | Admit: 2021-08-09 | Discharge: 2021-08-09 | Disposition: A | Discharge status: Home / Self Care | Payer: Medicare Other | Source: Ambulatory Visit | Attending: Pulmonary Disease | Admitting: Pulmonary Disease

## 2021-08-09 ENCOUNTER — Other Ambulatory Visit: Payer: Self-pay

## 2021-08-09 DIAGNOSIS — R918 Other nonspecific abnormal finding of lung field: Secondary | ICD-10-CM

## 2021-08-15 ENCOUNTER — Encounter: Payer: Self-pay | Admitting: Pulmonary Disease

## 2021-08-15 ENCOUNTER — Other Ambulatory Visit: Payer: Self-pay

## 2021-08-15 ENCOUNTER — Telehealth: Payer: Self-pay | Admitting: Radiation Oncology

## 2021-08-15 ENCOUNTER — Ambulatory Visit (INDEPENDENT_AMBULATORY_CARE_PROVIDER_SITE_OTHER): Payer: Medicare Other | Admitting: Pulmonary Disease

## 2021-08-15 VITALS — BP 128/56 | HR 98 | Temp 99.3°F | Ht 62.0 in | Wt 92.0 lb

## 2021-08-15 DIAGNOSIS — J449 Chronic obstructive pulmonary disease, unspecified: Secondary | ICD-10-CM | POA: Diagnosis not present

## 2021-08-15 DIAGNOSIS — R918 Other nonspecific abnormal finding of lung field: Secondary | ICD-10-CM

## 2021-08-15 DIAGNOSIS — J9611 Chronic respiratory failure with hypoxia: Secondary | ICD-10-CM | POA: Diagnosis not present

## 2021-08-15 NOTE — Progress Notes (Signed)
Synopsis: Referred in July 2022 for lung nodule by Shon Baton, MD  Subjective:   PATIENT ID: Beth Daniels GENDER: female DOB: September 02, 1945, MRN: [id number]  Chief Complaint  Patient presents with   Follow-up    Follow up. Patient is here for CT results.    This is a 76 year old female, past medical history of breast cancer on the right 9 years ago status post bilateral mastectomy, severe emphysema, chronic hypoxemic respiratory failure on 2 L nasal cannula.  Patient has had multiple CT images over the past 2+ years.  Has shown interval growth of at least 3 nodules.  She has a right upper lobe anterior nodule that has grown in size by at least 1 to 2 mm as well as a right lower lobe superior segment lesion that has grown.  And she has a left lung nodule that has grown from 4 mm to 7 mm.  From a respiratory standpoint she has been stable on her 2 L nasal cannula and compliant with her inhaler regimen.  OV 02/28/2021: Here today for follow-up regarding bronchoscopy.  All lung nodules that were sampled during bronchoscopy had no evidence of malignancy.  All were in difficult locations that are not sure that we were in the space.  We went over the imaging today in the office and explained the diagnostic yield again regarding small peripheral lesions and the utility of navigational bronchoscopy.  Most of her lesions 1 of which was in a very precarious location to access.  The right lower lobe superior segment lesion would be the 1 of which I am most concerned of being a potential malignancy due to its lobulated shape.  We also discussed this in detail.  OV 05/22/2021: Here today for follow-up after recent CT scan of the chest.  We spent a significant amount of time today discussing each of the nodules in her chest.  As well as the ones on the left side that I believe have a higher chance at having being malignant.  We also reviewed once again the details of the previous navigational bronchoscopy and the  various precarious locations in which they exist.  Also discussed that the lingular nodule on the left side is subsolid in nature and has enlarged just a small amount.  I do believe that this potentially is one of the highest risks as this did show change in 3 months but only a minimal alteration in its previous size.  OV 08/15/2021: Patient here today for follow-up after multiple pulmonary nodules.  She was taken for bronchoscopy with nondiagnostic procedure in the summer.  We have been following her lung nodule since then.  Patient had a recent CT scan of the chest completed February 2023 which showed enlargement of the lingular nodule concerning for primary bronchogenic carcinoma.  Here today to discuss results and next steps.   Past Medical History:  Diagnosis Date   Alcohol abuse    Anemia    Cancer ([place])    breast right   Chronic hyponatremia    COPD (chronic obstructive pulmonary disease) (HCC)    DCIS (ductal carcinoma in situ)    Dyspnea    Emphysema of lung (HCC)    Osteoporosis    Overactive bladder    PVD (peripheral vascular disease) ([place])    Tobacco use      Family History  Problem Relation Age of Onset   Alzheimer's disease Mother    Pneumonia Father        aspiration  pneumonia     Past Surgical History:  Procedure Laterality Date   ABDOMINAL AORTOGRAM W/LOWER EXTREMITY N/A 03/15/2019   Procedure: ABDOMINAL AORTOGRAM W/LOWER EXTREMITY;  Surgeon: Serafina Mitchell, MD;  Location: [facility];  Service: Cardiovascular;  Laterality: N/A;   aeptoplasty     BREAST SURGERY     BRONCHIAL BRUSHINGS  02/12/2021   Procedure: BRONCHIAL BRUSHINGS;  Surgeon: Garner Nash, DO;  Location: [facility];  Service: Pulmonary;;   BRONCHIAL NEEDLE ASPIRATION BIOPSY  02/12/2021   Procedure: BRONCHIAL NEEDLE ASPIRATION BIOPSIES;  Surgeon: Garner Nash, DO;  Location: [facility];  Service: Pulmonary;;   cartaract extraction     EYE SURGERY Bilateral    Cataract in R & L eye  and Retina surgery after cataract surgery in L eye   FIDUCIAL MARKER PLACEMENT  02/12/2021   Procedure: FIDUCIAL MARKER PLACEMENT;  Surgeon: Garner Nash, DO;  Location: [facility];  Service: Pulmonary;;   INTRAMEDULLARY (IM) NAIL INTERTROCHANTERIC Left 01/30/2019   Procedure: INTRAMEDULLARY (IM) NAIL INTERTROCHANTRIC;  Surgeon: Frederik Pear, MD;  Location: [facility];  Service: Orthopedics;  Laterality: Left;   IR FIBRIN GLUE REPAIR ANAL FISTULA     MASTECTOMY     s/p bl   PERIPHERAL VASCULAR INTERVENTION  03/15/2019   Procedure: PERIPHERAL VASCULAR INTERVENTION;  Surgeon: Serafina Mitchell, MD;  Location: [facility];  Service: Cardiovascular;;  Lt. SFA   PERIPHERAL VASCULAR THROMBECTOMY  03/15/2019   Procedure: PERIPHERAL VASCULAR THROMBECTOMY;  Surgeon: Serafina Mitchell, MD;  Location: [facility];  Service: Cardiovascular;;  Lt. AT, PT   RETINAL DETACHMENT SURGERY     sclerotherapy vein leg     VASCULAR SURGERY     VIDEO BRONCHOSCOPY WITH ENDOBRONCHIAL NAVIGATION Bilateral 02/12/2021   Procedure: VIDEO BRONCHOSCOPY WITH ENDOBRONCHIAL NAVIGATION;  Surgeon: Garner Nash, DO;  Location: [facility];  Service: Pulmonary;  Laterality: Bilateral;  ION   VIDEO BRONCHOSCOPY WITH RADIAL ENDOBRONCHIAL ULTRASOUND  02/12/2021   Procedure: RADIAL ENDOBRONCHIAL ULTRASOUND;  Surgeon: Garner Nash, DO;  Location: [facility];  Service: Pulmonary;;   vitreous retinal surgery      Social History   Socioeconomic History   Marital status: Married    Spouse name: Elenore Rota   Number of children: Not on file   Years of education: Not on file   Highest education level: Not on file  Occupational History   Occupation: RETIRED  Tobacco Use   Smoking status: Former    Packs/day: 1.50    Years: 50.00    Pack years: 75.00    Types: Cigarettes    Start date: 1970    Quit date: 10/06/2016    Years since quitting: 4.8   Smokeless tobacco: Never  Vaping Use   Vaping Use: Never used   Substance and Sexual Activity   Alcohol use: Yes    Alcohol/week: 28.0 standard drinks    Types: 28 Glasses of wine per week    Comment: 4 glasses wine a night   Drug use: Not Currently   Sexual activity: Not Currently  Other Topics Concern   Not on file  Social History Narrative   Right handed,    Lives with husband   Drinks 2 cups caffeine daily   Social Determinants of Health   Financial Resource Strain: Not on file  Food Insecurity: Not on file  Transportation Needs: Not on file  Physical Activity: Not on file  Stress: Not on file  Social Connections: Not  on file  Intimate Partner Violence: Not on file     No Known Allergies   Outpatient Medications Prior to Visit  Medication Sig Dispense Refill   albuterol (PROVENTIL) (2.5 MG/3ML) 0.083% nebulizer solution Take 3 mLs (2.5 mg total) by nebulization every 4 (four) hours as needed for wheezing or shortness of breath. 75 mL 3   albuterol (VENTOLIN HFA) 108 (90 Base) MCG/ACT inhaler USE 1 INHALATION EVERY 6 HOURS AS NEEDED FOR WHEEZING OR SHORTNESS OF BREATH 17 g 3   ANORO ELLIPTA 62.5-25 MCG/INH AEPB USE 1 INHALATION DAILY (Patient taking differently: Inhale 1 puff into the lungs daily.) 180 each 3   aspirin EC 81 MG tablet Take 81 mg by mouth daily.     atorvastatin (LIPITOR) 40 MG tablet      azelastine (ASTELIN) 0.1 % nasal spray Place 1 spray into both nostrils 2 (two) times daily. 30 mL 6   Azelastine-Fluticasone 137-50 MCG/ACT SUSP Place 1 spray into the nose daily. (Patient taking differently: Place 1 spray into the nose 2 (two) times daily.) 23 g 6   conjugated estrogens (PREMARIN) vaginal cream Place 1 Applicatorful vaginally every Monday, Wednesday, and Friday at 8 PM.     Cranberry 400 MG CAPS Take 1,200 mg by mouth daily at 2 PM.     denosumab (PROLIA) 60 MG/ML SOSY injection Inject 60 mg into the skin every 6 (six) months.      docusate sodium (COLACE) 100 MG capsule Take 1 capsule (100 mg total) by mouth 2  (two) times daily. (Patient taking differently: Take 100 mg by mouth at bedtime.) 10 capsule 0   Ferrous Fumarate (HEMOCYTE - 106 MG FE) 324 (106 Fe) MG TABS tablet Take 1 tablet by mouth daily at 3 pm.      fluticasone (FLONASE) 50 MCG/ACT nasal spray Place 2 sprays into both nostrils 2 (two) times daily. 16 mL 3   folic acid (FOLVITE) 1 MG tablet Take 1 mg by mouth daily.     gabapentin (NEURONTIN) 100 MG capsule Take 2 capsules (200 mg total) by mouth 3 (three) times daily. 540 capsule 3   gabapentin (NEURONTIN) 300 MG capsule Take 2 capsules (600 mg total) by mouth 3 (three) times daily. 540 capsule 3   guaiFENesin (MUCINEX) 600 MG 12 hr tablet Take 600 mg by mouth 2 (two) times daily.     hydrocortisone 2.5 % cream Apply 1 application topically daily.     Multiple Vitamin (MULTI-VITAMIN) tablet Take 1 tablet by mouth daily.      mupirocin ointment (BACTROBAN) 2 % Apply 1 application topically daily as needed (wound care).      MYRBETRIQ 50 MG TB24 tablet Take 50 mg by mouth at bedtime.     nicotine polacrilex (COMMIT) 4 MG lozenge Take 4 mg by mouth as needed for smoking cessation.      OXYGEN Inhale 2 L into the lungs continuous.     pantoprazole (PROTONIX) 40 MG tablet Take 40 mg by mouth daily.     sodium chloride (OCEAN) 0.65 % SOLN nasal spray Place 1 spray into both nostrils 2 (two) times daily. As needed during the day     Vitamin D, Cholecalciferol, 25 MCG (1000 UT) TABS Take 1,000 Units by mouth daily.     atorvastatin (LIPITOR) 20 MG tablet Take 20 mg by mouth at bedtime.  (Patient not taking: Reported on 05/23/2021)     No facility-administered medications prior to visit.    Review of Systems  Constitutional:  Positive for weight loss. Negative for chills, fever and malaise/fatigue.  HENT:  Negative for hearing loss, sore throat and tinnitus.   Eyes:  Negative for blurred vision and double vision.  Respiratory:  Positive for shortness of breath. Negative for cough,  hemoptysis, sputum production, wheezing and stridor.   Cardiovascular:  Negative for chest pain, palpitations, orthopnea, leg swelling and PND.  Gastrointestinal:  Negative for abdominal pain, constipation, diarrhea, heartburn, nausea and vomiting.  Genitourinary:  Negative for dysuria, hematuria and urgency.  Musculoskeletal:  Negative for joint pain and myalgias.  Skin:  Negative for itching and rash.  Neurological:  Negative for dizziness, tingling, weakness and headaches.  Endo/Heme/Allergies:  Negative for environmental allergies. Does not bruise/bleed easily.  Psychiatric/Behavioral:  Negative for depression. The patient is not nervous/anxious and does not have insomnia.   All other systems reviewed and are negative.   Objective:  Physical Exam Vitals reviewed.  Constitutional:      General: She is not in acute distress.    Appearance: She is well-developed.  HENT:     Head: Normocephalic and atraumatic.     Mouth/Throat:     Pharynx: No oropharyngeal exudate.  Eyes:     Conjunctiva/sclera: Conjunctivae normal.     Pupils: Pupils are equal, round, and reactive to light.  Neck:     Vascular: No JVD.     Trachea: No tracheal deviation.     Comments: Loss of supraclavicular fat Cardiovascular:     Rate and Rhythm: Normal rate and regular rhythm.     Heart sounds: S1 normal and S2 normal.     Comments: Distant heart tones Pulmonary:     Effort: No tachypnea or accessory muscle usage.     Breath sounds: No stridor. Decreased breath sounds (throughout all lung fields) present. No wheezing, rhonchi or rales.     Comments: Diminished breath sounds bilaterally Abdominal:     General: Bowel sounds are normal. There is no distension.     Palpations: Abdomen is soft.     Tenderness: There is no abdominal tenderness.  Musculoskeletal:        General: No deformity (muscle wasting ).  Skin:    General: Skin is warm and dry.     Capillary Refill: Capillary refill takes less than 2  seconds.     Findings: No rash.  Neurological:     Mental Status: She is alert and oriented to person, place, and time.  Psychiatric:        Behavior: Behavior normal.     Vitals:   08/15/21 1350  BP: (!) 128/56  Pulse: 98  Temp: 99.3 F (37.4 C)  TempSrc: Oral  SpO2: 93%  Weight: 92 lb (41.7 kg)  Height: 5\' 2"  (1.575 m)    93% on RA BMI Readings from Last 3 Encounters:  08/15/21 16.83 kg/m  05/23/21 16.97 kg/m  05/22/21 17.01 kg/m   Wt Readings from Last 3 Encounters:  08/15/21 92 lb (41.7 kg)  05/23/21 92 lb 12.8 oz (42.1 kg)  05/22/21 93 lb (42.2 kg)     CBC    Component Value Date/Time   WBC 3.8 (L) 02/12/2021 0628   RBC 3.53 (L) 02/12/2021 0628   HGB 12.0 02/12/2021 0628   HCT 36.4 02/12/2021 0628   PLT 201 02/12/2021 0628   MCV 103.1 (H) 02/12/2021 0628   MCH 34.0 02/12/2021 0628   MCHC 33.0 02/12/2021 0628   RDW 12.9 02/12/2021 0628   LYMPHSABS 0.8 01/29/2019 1354  MONOABS 0.3 01/29/2019 1354   EOSABS 0.0 01/29/2019 1354   BASOSABS 0.0 01/29/2019 1354     Chest Imaging: 01/09/2021 CT chest: History of breast cancer, now has nodule surveillance with multiple small pulmonary nodules that have increased in size.  Right lower lobe superior segment now 10 mm, right upper lobe 9 mm lesion as well as a lingular semisolid lesion that was 4 mm and now 7 years.  Several multiple small subsolid lesions and solid lesions that have large.  The suprasellar lesion is macrolobulated, the right upper lobe anterior lesion is smoother bordered.  We may be dealing with metachronous primary lung cancers due to the patient's history of smoking and severe emphysema. The patient's images have been independently reviewed by me.  02/05/2021 CT chest: Stable pulmonary nodules in comparison to July CT.  Lobulated superior segment lesion.  05/17/2021 CT chest super D: Multiple pulmonary nodules some subsolid in nature some rounded smooth, 1 macrolobulated.  Only one that  really changes in the left lingula in comparison to the others.  I think the left-sided nodules are the 2 of which that are likely the highest risk of being malignant. The patient's images have been independently reviewed by me.    Pulmonary Functions Testing Results: No flowsheet data found.  FeNO:   Pathology:   Echocardiogram:   Heart Catheterization:     Assessment & Plan:     ICD-10-CM   1. Lung nodules  R91.8     2. Multiple lung nodules on CT  R91.8     3. Chronic obstructive pulmonary disease, unspecified COPD type ([place])  J44.9     4. Chronic hypoxemic respiratory failure (HCC)  J96.11       Discussion:  This is a 76 year old female, slowly enlarging multiple pulmonary nodules, they all 4 that we have been watching have various morphologies.  The 2 within the right lung I believe are low chance of malignancy however the one of the left lingula has showed subsolid change and is now slightly larger.  I believe the lingular lesion as probably the highest risk of being a malignancy.  She also has a left upper lobe lesion that has slight pleural tethering that is likely high risk of malignancy.  Plan: I think it is reasonable for her to see radiation oncology and discuss empiric treatments for radiation to both of the nodules on the left side. I think the nodules on the right side are less likely to be malignant however we also talked about repeat bronchoscopy and consideration for biopsy. I think with advancements in technology undergoing a robotic assisted navigational bronchoscopy and CT guidance with the system we have a higher chance of getting an answer. I did talk about this with the patient today but with her severity of lung disease and chronic hypoxemic respiratory failure and severe emphysema it does pose a significant amount of risk to put her to sleep to have this done.  She is a little anxious about undergoing repeat procedure.  Therefore, I recommended her to  meet with radiation oncology and to discuss the possibility of empiric treatments to the left lingular lesion and consideration for close follow-up of the others.  She is willing to do this.  If we decide collectively as a team that she needs a repeat biopsy I am happy to take her back for procedure.  Otherwise from a COPD standpoint she can stay on her current regimen.  Return to clinic in 6 months for COPD  follow-up, we will stay in touch with radiation oncology after the consultation with them.    Current Outpatient Medications:    albuterol (PROVENTIL) (2.5 MG/3ML) 0.083% nebulizer solution, Take 3 mLs (2.5 mg total) by nebulization every 4 (four) hours as needed for wheezing or shortness of breath., Disp: 75 mL, Rfl: 3   albuterol (VENTOLIN HFA) 108 (90 Base) MCG/ACT inhaler, USE 1 INHALATION EVERY 6 HOURS AS NEEDED FOR WHEEZING OR SHORTNESS OF BREATH, Disp: 17 g, Rfl: 3   ANORO ELLIPTA 62.5-25 MCG/INH AEPB, USE 1 INHALATION DAILY (Patient taking differently: Inhale 1 puff into the lungs daily.), Disp: 180 each, Rfl: 3   aspirin EC 81 MG tablet, Take 81 mg by mouth daily., Disp: , Rfl:    atorvastatin (LIPITOR) 40 MG tablet, , Disp: , Rfl:    azelastine (ASTELIN) 0.1 % nasal spray, Place 1 spray into both nostrils 2 (two) times daily., Disp: 30 mL, Rfl: 6   Azelastine-Fluticasone 137-50 MCG/ACT SUSP, Place 1 spray into the nose daily. (Patient taking differently: Place 1 spray into the nose 2 (two) times daily.), Disp: 23 g, Rfl: 6   conjugated estrogens (PREMARIN) vaginal cream, Place 1 Applicatorful vaginally every Monday, Wednesday, and Friday at 8 PM., Disp: , Rfl:    Cranberry 400 MG CAPS, Take 1,200 mg by mouth daily at 2 PM., Disp: , Rfl:    denosumab (PROLIA) 60 MG/ML SOSY injection, Inject 60 mg into the skin every 6 (six) months. , Disp: , Rfl:    docusate sodium (COLACE) 100 MG capsule, Take 1 capsule (100 mg total) by mouth 2 (two) times daily. (Patient taking differently: Take 100  mg by mouth at bedtime.), Disp: 10 capsule, Rfl: 0   Ferrous Fumarate (HEMOCYTE - 106 MG FE) 324 (106 Fe) MG TABS tablet, Take 1 tablet by mouth daily at 3 pm. , Disp: , Rfl:    fluticasone (FLONASE) 50 MCG/ACT nasal spray, Place 2 sprays into both nostrils 2 (two) times daily., Disp: 16 mL, Rfl: 3   folic acid (FOLVITE) 1 MG tablet, Take 1 mg by mouth daily., Disp: , Rfl:    gabapentin (NEURONTIN) 100 MG capsule, Take 2 capsules (200 mg total) by mouth 3 (three) times daily., Disp: 540 capsule, Rfl: 3   gabapentin (NEURONTIN) 300 MG capsule, Take 2 capsules (600 mg total) by mouth 3 (three) times daily., Disp: 540 capsule, Rfl: 3   guaiFENesin (MUCINEX) 600 MG 12 hr tablet, Take 600 mg by mouth 2 (two) times daily., Disp: , Rfl:    hydrocortisone 2.5 % cream, Apply 1 application topically daily., Disp: , Rfl:    Multiple Vitamin (MULTI-VITAMIN) tablet, Take 1 tablet by mouth daily. , Disp: , Rfl:    mupirocin ointment (BACTROBAN) 2 %, Apply 1 application topically daily as needed (wound care). , Disp: , Rfl:    MYRBETRIQ 50 MG TB24 tablet, Take 50 mg by mouth at bedtime., Disp: , Rfl:    nicotine polacrilex (COMMIT) 4 MG lozenge, Take 4 mg by mouth as needed for smoking cessation. , Disp: , Rfl:    OXYGEN, Inhale 2 L into the lungs continuous., Disp: , Rfl:    pantoprazole (PROTONIX) 40 MG tablet, Take 40 mg by mouth daily., Disp: , Rfl:    sodium chloride (OCEAN) 0.65 % SOLN nasal spray, Place 1 spray into both nostrils 2 (two) times daily. As needed during the day, Disp: , Rfl:    Vitamin D, Cholecalciferol, 25 MCG (1000 UT) TABS, Take 1,000  Units by mouth daily., Disp: , Rfl:     Garner Nash, DO [facility] 08/15/2021 2:02 PM

## 2021-08-15 NOTE — Patient Instructions (Signed)
Thank you for visiting Dr. Valeta Harms at [facility]. Today we recommend the following:  Orders Placed This Encounter  Procedures   Ambulatory referral to Radiation Oncology   Return in about 6 months (around 02/12/2022), or if symptoms worsen or fail to improve.    Please do your part to reduce the spread of COVID-19.

## 2021-08-15 NOTE — Telephone Encounter (Signed)
Spoke to patient 2/9 @ 3:03 pm she would like a call back in 30 mins currently driving.

## 2021-08-19 NOTE — Progress Notes (Signed)
Thoracic Location of Tumor / Histology: Right Upper Lobe/ Left Lower Lobe  Patient has been in surveillance over the past 2+ years for lung nodules.  CT Super D Chest 08/09/2021: Part solid nodule of the left lingula measuring up to 1.2 x 0.9 cm on series 8, image 110 with solid component measuring up to 0.9 cm, overall size and solid component are slightly increased when compared with prior exam, previously measured up to 1.0 x 0.9 cm.  Bilateral solid pulmonary nodules are stable when compared with prior exam. Reference solid nodule of the anterior right upper lobe measuring 8 x 7 mm on series 2, image 50. Reference pulmonary nodule of the left lower lobe measuring 8 x 6 mm on image 112.  CT Super D Chest 05/17/2021: Continued slow growth of subsolid 1.0 cm lingular pulmonary nodule, which measured 0.9 cm on 02/05/2021 chest CT and 0.6 cm on 11/07/2019 chest CT, suspicious for slow growing primary bronchogenic adenocarcinoma. Additional scattered bilateral solid pulmonary nodules are stable and warrant continued chest CT surveillance.   Biopsies of RLL and LLL 02/12/2021      Tobacco/Marijuana/Snuff/ETOH use: Former Smoker, quit in 2018.  Past/Anticipated interventions by Pulmonary, if any:  Dr. Valeta Harms OV 08/15/2021 - think it is reasonable for her to see radiation oncology and discuss empiric treatments for radiation to both of the nodules on the left side. -I think the nodules on the right side are less likely to be malignant however we also talked about repeat bronchoscopy and consideration for biopsy. -I think with advancements in technology undergoing a robotic assisted navigational bronchoscopy and CT guidance with the system we have a higher chance of getting an answer. -I did talk about this with the patient today but with her severity of lung disease and chronic hypoxemic respiratory failure and severe emphysema it does pose a significant amount of risk to put her to sleep to have this  done.  She is a little anxious about undergoing repeat procedure. -Therefore, I recommended her to meet with radiation oncology and to discuss the possibility of empiric treatments to the left lingular lesion and consideration for close follow-up of the others.  She is willing to do this.  If we decide collectively as a team that she needs a repeat biopsy I am happy to take her back for procedure.    Past/Anticipated interventions by cardiothoracic surgery, if any:   Past/Anticipated interventions by medical oncology, if any:     Signs/Symptoms Weight changes, if any: Stable, fluctuates 89-92. Respiratory complaints, if any: SOB, wears oxygen 2 liters. Hemoptysis, if any: Productive cough clear/yellow phlegm especially in the morning. Pain issues, if any:  No  SAFETY ISSUES: Prior radiation? No Pacemaker/ICD? No  Possible current pregnancy? Postmenopausal Is the patient on methotrexate? No  Current Complaints / other details:   -Wears Oxygen 2 Liters

## 2021-08-19 NOTE — Progress Notes (Signed)
Radiation Oncology         [phone] ________________________________  Name: Beth Daniels        MRN: [id number]  Date of Service: 08/20/2021 DOB: 1946-01-18  GE:[id number], Beth Reichmann, MD  Beth Nash, DO     REFERRING PHYSICIAN: Garner Nash, DO   DIAGNOSIS: The encounter diagnosis was Malignant neoplasm of lower lobe of left lung ([place]).   HISTORY OF PRESENT ILLNESS: Beth Daniels is a 76 y.o. female seen at the request of Dr. Valeta Daniels for a diagnosis of putative stage I lung cancer of the left lower lobe.  She has a history of breast cancer almost 10 years ago treated with bilateral mastectomy.  She has emphysema with continuous oxygen at 2 L due to hypoxemic respiratory failure.  Multiple scans have been followed by pulmonary in the last 2 years and 3 nodules have been seen that have grown.  PET in August 2022 did not show hypermetabolic change. She did undergo bronchoscopy in the summer 2022 and all the nodules that were sampled were negative for disease however they have remained suspicious radiographically.  Her most recent CT of the chest on 08/09/2021 showed an increase in the lingular lesion which has been the most suspicious to date measuring up to 1.2 cm with a solid component of 9 mm.  Stable bilateral solid pulmonary nodules were otherwise seen including the right upper lobe measuring 8 x 7 mm and left lower lobe measuring 8 x 6 mm.  She is seen today to discuss proceeding definitively with radiotherapy and foregoing additional tissue sampling given her severity of lung disease.    PREVIOUS RADIATION THERAPY: No   PAST MEDICAL HISTORY:  Past Medical History:  Diagnosis Date   Alcohol abuse    Anemia    Cancer ([place])    breast right   Chronic hyponatremia    COPD (chronic obstructive pulmonary disease) (HCC)    DCIS (ductal carcinoma in situ)    Dyspnea    Emphysema of lung (HCC)    Osteoporosis    Overactive bladder    PVD (peripheral vascular disease) ([place])    Tobacco use         PAST SURGICAL HISTORY: Past Surgical History:  Procedure Laterality Date   ABDOMINAL AORTOGRAM W/LOWER EXTREMITY N/A 03/15/2019   Procedure: ABDOMINAL AORTOGRAM W/LOWER EXTREMITY;  Surgeon: Serafina Mitchell, MD;  Location: [facility];  Service: Cardiovascular;  Laterality: N/A;   aeptoplasty     BREAST SURGERY     BRONCHIAL BRUSHINGS  02/12/2021   Procedure: BRONCHIAL BRUSHINGS;  Surgeon: Beth Nash, DO;  Location: [facility];  Service: Pulmonary;;   BRONCHIAL NEEDLE ASPIRATION BIOPSY  02/12/2021   Procedure: BRONCHIAL NEEDLE ASPIRATION BIOPSIES;  Surgeon: Beth Nash, DO;  Location: [facility];  Service: Pulmonary;;   cartaract extraction     EYE SURGERY Bilateral    Cataract in R & L eye and Retina surgery after cataract surgery in L eye   FIDUCIAL MARKER PLACEMENT  02/12/2021   Procedure: FIDUCIAL MARKER PLACEMENT;  Surgeon: Beth Nash, DO;  Location: [facility];  Service: Pulmonary;;   INTRAMEDULLARY (IM) NAIL INTERTROCHANTERIC Left 01/30/2019   Procedure: INTRAMEDULLARY (IM) NAIL INTERTROCHANTRIC;  Surgeon: Frederik Pear, MD;  Location: [facility];  Service: Orthopedics;  Laterality: Left;   IR FIBRIN GLUE REPAIR ANAL FISTULA     MASTECTOMY     s/p bl   PERIPHERAL VASCULAR INTERVENTION  03/15/2019   Procedure: PERIPHERAL VASCULAR  INTERVENTION;  Surgeon: Serafina Mitchell, MD;  Location: [facility];  Service: Cardiovascular;;  Lt. SFA   PERIPHERAL VASCULAR THROMBECTOMY  03/15/2019   Procedure: PERIPHERAL VASCULAR THROMBECTOMY;  Surgeon: Serafina Mitchell, MD;  Location: [facility];  Service: Cardiovascular;;  Lt. AT, PT   RETINAL DETACHMENT SURGERY     sclerotherapy vein leg     VASCULAR SURGERY     VIDEO BRONCHOSCOPY WITH ENDOBRONCHIAL NAVIGATION Bilateral 02/12/2021   Procedure: VIDEO BRONCHOSCOPY WITH ENDOBRONCHIAL NAVIGATION;  Surgeon: Beth Nash, DO;  Location: [facility];  Service: Pulmonary;  Laterality: Bilateral;  ION   VIDEO  BRONCHOSCOPY WITH RADIAL ENDOBRONCHIAL ULTRASOUND  02/12/2021   Procedure: RADIAL ENDOBRONCHIAL ULTRASOUND;  Surgeon: Beth Nash, DO;  Location: [facility];  Service: Pulmonary;;   vitreous retinal surgery       FAMILY HISTORY:  Family History  Problem Relation Age of Onset   Alzheimer's disease Mother    Pneumonia Father        aspiration pneumonia     SOCIAL HISTORY:  reports that she quit smoking about 4 years ago. Her smoking use included cigarettes. She started smoking about 53 years ago. She has a 75.00 pack-year smoking history. She has never used smokeless tobacco. She reports current alcohol use of about 28.0 standard drinks per week. She reports that she does not currently use drugs. The patient is married and accompanied by her husband. They live in [place] and she is a retired from working in Personal assistant.   ALLERGIES: Patient has no known allergies.   MEDICATIONS:  Current Outpatient Medications  Medication Sig Dispense Refill   albuterol (PROVENTIL) (2.5 MG/3ML) 0.083% nebulizer solution Take 3 mLs (2.5 mg total) by nebulization every 4 (four) hours as needed for wheezing or shortness of breath. 75 mL 3   albuterol (VENTOLIN HFA) 108 (90 Base) MCG/ACT inhaler USE 1 INHALATION EVERY 6 HOURS AS NEEDED FOR WHEEZING OR SHORTNESS OF BREATH 17 g 3   ANORO ELLIPTA 62.5-25 MCG/INH AEPB USE 1 INHALATION DAILY (Patient taking differently: Inhale 1 puff into the lungs daily.) 180 each 3   aspirin EC 81 MG tablet Take 81 mg by mouth daily.     atorvastatin (LIPITOR) 40 MG tablet      azelastine (ASTELIN) 0.1 % nasal spray Place 1 spray into both nostrils 2 (two) times daily. 30 mL 6   Azelastine-Fluticasone 137-50 MCG/ACT SUSP Place 1 spray into the nose daily. (Patient taking differently: Place 1 spray into the nose 2 (two) times daily.) 23 g 6   conjugated estrogens (PREMARIN) vaginal cream Place 1 Applicatorful vaginally every Monday, Wednesday, and Friday at 8 PM.      Cranberry 400 MG CAPS Take 1,200 mg by mouth daily at 2 PM.     denosumab (PROLIA) 60 MG/ML SOSY injection Inject 60 mg into the skin every 6 (six) months.      docusate sodium (COLACE) 100 MG capsule Take 1 capsule (100 mg total) by mouth 2 (two) times daily. (Patient taking differently: Take 100 mg by mouth at bedtime.) 10 capsule 0   Ferrous Fumarate (HEMOCYTE - 106 MG FE) 324 (106 Fe) MG TABS tablet Take 1 tablet by mouth daily at 3 pm.      fluticasone (FLONASE) 50 MCG/ACT nasal spray Place 2 sprays into both nostrils 2 (two) times daily. 16 mL 3   folic acid (FOLVITE) 1 MG tablet Take 1 mg by mouth daily.     gabapentin (  NEURONTIN) 100 MG capsule Take 2 capsules (200 mg total) by mouth 3 (three) times daily. 540 capsule 3   gabapentin (NEURONTIN) 300 MG capsule Take 2 capsules (600 mg total) by mouth 3 (three) times daily. 540 capsule 3   guaiFENesin (MUCINEX) 600 MG 12 hr tablet Take 600 mg by mouth 2 (two) times daily.     hydrocortisone 2.5 % cream Apply 1 application topically daily.     Multiple Vitamin (MULTI-VITAMIN) tablet Take 1 tablet by mouth daily.      mupirocin ointment (BACTROBAN) 2 % Apply 1 application topically daily as needed (wound care).      MYRBETRIQ 50 MG TB24 tablet Take 50 mg by mouth at bedtime.     nicotine polacrilex (COMMIT) 4 MG lozenge Take 4 mg by mouth as needed for smoking cessation.      OXYGEN Inhale 2 L into the lungs continuous.     pantoprazole (PROTONIX) 40 MG tablet Take 40 mg by mouth daily.     sodium chloride (OCEAN) 0.65 % SOLN nasal spray Place 1 spray into both nostrils 2 (two) times daily. As needed during the day     Vitamin D, Cholecalciferol, 25 MCG (1000 UT) TABS Take 1,000 Units by mouth daily.     No current facility-administered medications for this encounter.     REVIEW OF SYSTEMS: On review of systems the patient reports that she is doing well overall despite her difficulties with needing oxygen. She is stable using 2L [place]. She  reports morning time coughing spells and that she has productive clear to yellow mucous. No blood is reported with coughing. She recently had a positive hemocult and will see Dr. Michail Sermon tomorrow. She has dark stools but has attributed this to taking Iron. No other complaints are noted.      PHYSICAL EXAM:  Wt Readings from Last 3 Encounters:  08/15/21 92 lb (41.7 kg)  05/23/21 92 lb 12.8 oz (42.1 kg)  05/22/21 93 lb (42.2 kg)   Temp Readings from Last 3 Encounters:  08/20/21 (!) 97.3 F (36.3 C) (Temporal)  08/15/21 99.3 F (37.4 C) (Oral)  05/23/21 97.8 F (36.6 C) (Temporal)   BP Readings from Last 3 Encounters:  08/20/21 (!) 130/55  08/15/21 (!) 128/56  05/23/21 137/72   Pulse Readings from Last 3 Encounters:  08/20/21 92  08/15/21 98  05/23/21 98   Pain Assessment Pain Score: 0-No pain/10  In general this is a thin appearing caucasian female in no acute distress. She's alert and oriented x4 and appropriate throughout the examination. Cardiopulmonary assessment is negative for acute distress and she exhibits normal effort.     ECOG = 1  0 - Asymptomatic (Fully active, able to carry on all predisease activities without restriction)  1 - Symptomatic but completely ambulatory (Restricted in physically strenuous activity but ambulatory and able to carry out work of a light or sedentary nature. For example, light housework, office work)  2 - Symptomatic, <50% in bed during the day (Ambulatory and capable of all self care but unable to carry out any work activities. Up and about more than 50% of waking hours)  3 - Symptomatic, >50% in bed, but not bedbound (Capable of only limited self-care, confined to bed or chair 50% or more of waking hours)  4 - Bedbound (Completely disabled. Cannot carry on any self-care. Totally confined to bed or chair)  5 - Death   Eustace Pen MM, Creech RH, Tormey DC, et al. [phone]). "Toxicity and  response criteria of the [facility]  Group". [place] Oncol. 5 (6): 649-55    LABORATORY DATA:  Lab Results  Component Value Date   WBC 3.8 (L) 02/12/2021   HGB 12.0 02/12/2021   HCT 36.4 02/12/2021   MCV 103.1 (H) 02/12/2021   PLT 201 02/12/2021   Lab Results  Component Value Date   NA 134 (L) 02/12/2021   K 3.7 02/12/2021   CL 94 (L) 02/12/2021   CO2 33 (H) 02/12/2021   Lab Results  Component Value Date   ALT 17 02/12/2021   AST 29 02/12/2021   ALKPHOS 75 02/12/2021   BILITOT 0.8 02/12/2021      RADIOGRAPHY: CT Super D Chest Wo Contrast  Result Date: 08/12/2021 CLINICAL DATA:  Follow-up lung nodules EXAM: CT CHEST WITHOUT CONTRAST TECHNIQUE: Multidetector CT imaging of the chest was performed using thin slice collimation for electromagnetic bronchoscopy planning purposes, without intravenous contrast. RADIATION DOSE REDUCTION: This exam was performed according to the departmental dose-optimization program which includes automated exposure control, adjustment of the mA and/or kV according to patient size and/or use of iterative reconstruction technique. COMPARISON:  Multiple priors, most recent chest CT dated May 17, 2021 FINDINGS: Cardiovascular: Normal heart size. No pericardial effusion. Mitral annular calcifications. Lad and circumflex coronary artery calcifications. Atherosclerotic disease of the thoracic aorta. Mediastinum/Nodes: Mildly patulous esophagus. Thyroid is unremarkable. Soft tissue nodule of the anterior right mediastinum measuring 1.0 by 1.4 cm on series 2, image 97, unchanged in size when compared with prior exam dated Nov 19, 2018. Lungs/Pleura: Central airways are patent. Centrilobular emphysema. Part solid nodule of the left lingula measuring up to 1.2 x 0.9 cm on series 8, image 110 with solid component measuring up to 0.9 cm, overall size and solid component are slightly increased when compared with prior exam, previously measured up to 1.0 x 0.9 cm. Bilateral solid pulmonary nodules are  stable when compared with prior exam. Reference solid nodule of the anterior right upper lobe measuring 8 x 7 mm on series 2, image 50. Reference pulmonary nodule of the left lower lobe measuring 8 x 6 mm on image 112. Upper Abdomen: Prominent right extrarenal pelvis, unchanged compared to prior. No acute abnormality. Musculoskeletal: Stable T12 compression fracture. No aggressive appearing osseous lesions. IMPRESSION: 1. Increased size of part solid lingular pulmonary nodule which measures up to 1.2 cm with a 0.9 cm solid component, concerning for primary lung adenocarcinoma. 2. Stable bilateral solid pulmonary nodules. 3. Soft tissue nodule of the anterior right mediastinum, unchanged in size when compared with prior exams dating back to Nov 19, 2018, possibly a pre-vascular lymph node or small thymoma. Recommend continued attention on follow-up. 4. Aortic Atherosclerosis (ICD10-I70.0) and Emphysema (ICD10-J43.9). Electronically Signed   By: Yetta Glassman M.D.   On: 08/12/2021 11:17       IMPRESSION/PLAN: 1. Putative Stage IA2, cT1bN0M0, NSCLC of the LLL along the level of the lingula. Dr. Lisbeth Renshaw discusses the pathology findings and reviews the nature of early stage lung disease.  Dr. Lisbeth Renshaw reviews that the standard of care is for biopsy. He also discusses the treatment standards of surgical resection. However for patients who are not medical candidates to undergo surgery, or who choose to forgo surgery, stereotactic body radiotherapy (SBRT) is an appropriate alternative. We also discussed the concerns for proceeding without tissue confirmation but at the conclusion of our discussion the risks of tissue sampling are felt to outweight the risks of treatment without definitive diagnosis an  the patient is in agreement with this plan. We discussed the risks, benefits, short, and long term effects of radiotherapy, as well as the curative intent, and the patient is interested in proceeding. Dr. Lisbeth Renshaw discusses the  delivery and logistics of radiotherapy and anticipates a course of 3-5 fractions of radiotherapy.  Written consent is obtained and placed in the chart, a copy was provided to the patient. She will receive a call for treatment planning from our simulation department. 2. RUL and LLL nodules. These will be followed expectantly with subsequent scans. 3. Heme positive stool. She will follow up with Dr. Michail Sermon tomorrow to determine next steps.  In a visit lasting 60 minutes, greater than 50% of the time was spent face to face with Dr. Lisbeth Renshaw (and myself remotely on Webex) discussing the patient's condition, in preparation for the discussion, and coordinating the patient's care.   The above documentation reflects my direct findings during this shared patient visit. Please see the separate note by Dr. Lisbeth Renshaw on this date for the remainder of the patient's plan of care.    Carola Rhine, [facility]   **Disclaimer: This note was dictated with voice recognition software. Similar sounding words can inadvertently be transcribed and this note may contain transcription errors which may not have been corrected upon publication of note.**

## 2021-08-20 ENCOUNTER — Encounter: Payer: Self-pay | Admitting: Radiation Oncology

## 2021-08-20 ENCOUNTER — Ambulatory Visit
Admission: RE | Admit: 2021-08-20 | Discharge: 2021-08-20 | Disposition: A | Discharge status: Home / Self Care | Payer: Medicare Other | Source: Ambulatory Visit | Attending: Radiation Oncology | Admitting: Radiation Oncology

## 2021-08-20 ENCOUNTER — Other Ambulatory Visit: Payer: Self-pay

## 2021-08-20 DIAGNOSIS — C3432 Malignant neoplasm of lower lobe, left bronchus or lung: Secondary | ICD-10-CM | POA: Insufficient documentation

## 2021-08-20 DIAGNOSIS — R918 Other nonspecific abnormal finding of lung field: Secondary | ICD-10-CM | POA: Diagnosis not present

## 2021-08-20 DIAGNOSIS — M810 Age-related osteoporosis without current pathological fracture: Secondary | ICD-10-CM | POA: Insufficient documentation

## 2021-08-20 DIAGNOSIS — J432 Centrilobular emphysema: Secondary | ICD-10-CM | POA: Diagnosis not present

## 2021-08-20 DIAGNOSIS — Z7982 Long term (current) use of aspirin: Secondary | ICD-10-CM | POA: Diagnosis not present

## 2021-08-20 DIAGNOSIS — Z79899 Other long term (current) drug therapy: Secondary | ICD-10-CM | POA: Diagnosis not present

## 2021-08-20 DIAGNOSIS — Z87891 Personal history of nicotine dependence: Secondary | ICD-10-CM | POA: Insufficient documentation

## 2021-08-20 DIAGNOSIS — I739 Peripheral vascular disease, unspecified: Secondary | ICD-10-CM | POA: Diagnosis not present

## 2021-08-20 DIAGNOSIS — Z9981 Dependence on supplemental oxygen: Secondary | ICD-10-CM | POA: Diagnosis not present

## 2021-08-20 DIAGNOSIS — R195 Other fecal abnormalities: Secondary | ICD-10-CM | POA: Insufficient documentation

## 2021-08-20 DIAGNOSIS — M4854XA Collapsed vertebra, not elsewhere classified, thoracic region, initial encounter for fracture: Secondary | ICD-10-CM | POA: Diagnosis not present

## 2021-08-28 ENCOUNTER — Ambulatory Visit
Admission: RE | Admit: 2021-08-28 | Discharge: 2021-08-28 | Disposition: A | Discharge status: Home / Self Care | Payer: Medicare Other | Source: Ambulatory Visit | Attending: Radiation Oncology | Admitting: Radiation Oncology

## 2021-08-28 ENCOUNTER — Other Ambulatory Visit: Payer: Self-pay

## 2021-08-28 DIAGNOSIS — C3432 Malignant neoplasm of lower lobe, left bronchus or lung: Secondary | ICD-10-CM | POA: Insufficient documentation

## 2021-08-29 ENCOUNTER — Encounter: Payer: Self-pay | Admitting: Pulmonary Disease

## 2021-08-29 ENCOUNTER — Encounter: Payer: Self-pay | Admitting: Radiation Oncology

## 2021-08-30 ENCOUNTER — Other Ambulatory Visit: Payer: Self-pay | Admitting: Gastroenterology

## 2021-09-10 ENCOUNTER — Ambulatory Visit
Admission: RE | Admit: 2021-09-10 | Discharge: 2021-09-10 | Disposition: A | Discharge status: Home / Self Care | Payer: Medicare Other | Source: Ambulatory Visit | Attending: Radiation Oncology | Admitting: Radiation Oncology

## 2021-09-10 DIAGNOSIS — C3432 Malignant neoplasm of lower lobe, left bronchus or lung: Secondary | ICD-10-CM | POA: Diagnosis present

## 2021-09-12 ENCOUNTER — Ambulatory Visit
Admission: RE | Admit: 2021-09-12 | Discharge: 2021-09-12 | Disposition: A | Discharge status: Home / Self Care | Payer: Medicare Other | Source: Ambulatory Visit | Attending: Radiation Oncology | Admitting: Radiation Oncology

## 2021-09-12 ENCOUNTER — Encounter: Payer: Self-pay | Admitting: Pulmonary Disease

## 2021-09-12 ENCOUNTER — Other Ambulatory Visit: Payer: Self-pay | Admitting: Pulmonary Disease

## 2021-09-12 ENCOUNTER — Other Ambulatory Visit: Payer: Self-pay

## 2021-09-12 DIAGNOSIS — C3432 Malignant neoplasm of lower lobe, left bronchus or lung: Secondary | ICD-10-CM | POA: Diagnosis not present

## 2021-09-12 NOTE — Telephone Encounter (Signed)
Please advise on mychart.  ?

## 2021-09-13 NOTE — Telephone Encounter (Signed)
Refill submitted ?Garner Nash, DO ?[facility] ?09/13/2021 4:50 PM  ?  ? ?

## 2021-09-17 ENCOUNTER — Ambulatory Visit
Admission: RE | Admit: 2021-09-17 | Discharge: 2021-09-17 | Disposition: A | Discharge status: Home / Self Care | Payer: Medicare Other | Source: Ambulatory Visit | Attending: Radiation Oncology | Admitting: Radiation Oncology

## 2021-09-17 ENCOUNTER — Other Ambulatory Visit: Payer: Self-pay

## 2021-09-17 VITALS — BP 141/67 | HR 90 | Temp 97.6°F | Resp 20

## 2021-09-17 DIAGNOSIS — C3432 Malignant neoplasm of lower lobe, left bronchus or lung: Secondary | ICD-10-CM

## 2021-09-17 NOTE — Progress Notes (Signed)
?  Radiation Oncology         [phone] ?________________________________ ? ?Name: Beth Daniels MRN: [id number]  ?Date: 09/17/2021  DOB: 1945-12-06 ? ?End of Treatment Note ? ?Diagnosis:    Putative Stage IA2, cT1bN0M0, NSCLC of the LLL along the level of the lingula. ? ?Indication for treatment:  Curative      ? ?Radiation treatment dates:   09/10/21-09/17/21 ? ?Site/dose:   The tumor in the LLL was treated with a course of stereotactic body radiation treatment. The patient received 54 Gy In 3 fractions at 18 G per fraction. ? ?Narrative: The patient tolerated radiation treatment relatively well.   The patient did not have any signs of acute toxicity during treatment. She does have other nodules that need to be followed. ? ?Plan: The patient will receive a call in about one month from the radiation oncology department. She will have a repeat CT chest in 6-8 weeks. I will place orders for this new baseline assessment. ? ? ?------------------------------------------------ ? ? ? ?Carola Rhine, PAC  ?

## 2021-09-18 ENCOUNTER — Encounter: Payer: Self-pay | Admitting: Pulmonary Disease

## 2021-09-18 NOTE — Telephone Encounter (Signed)
Received the following message from patient:  ? ?"Dr. Valeta Harms, now that I have finished radiation, and [facility] will schedule a new CT scan in 6 to 8 weeks, after  side effects abate.  I had a message to call to schedule an appt. for Aug.  Do you want to see me sooner? " ? ?Patient was last seen on 08/15/21 and was advised to follow up in 6 months (August 2023).  ? ?Dr. Valeta Harms, can you please advise? Thanks!  ?

## 2021-10-01 ENCOUNTER — Encounter: Payer: Self-pay | Admitting: Neurology

## 2021-10-01 ENCOUNTER — Ambulatory Visit (INDEPENDENT_AMBULATORY_CARE_PROVIDER_SITE_OTHER): Payer: Medicare Other | Admitting: Neurology

## 2021-10-01 VITALS — BP 137/67 | HR 93 | Ht 62.0 in | Wt 92.5 lb

## 2021-10-01 DIAGNOSIS — M792 Neuralgia and neuritis, unspecified: Secondary | ICD-10-CM | POA: Diagnosis not present

## 2021-10-01 DIAGNOSIS — G629 Polyneuropathy, unspecified: Secondary | ICD-10-CM

## 2021-10-01 DIAGNOSIS — D539 Nutritional anemia, unspecified: Secondary | ICD-10-CM | POA: Diagnosis not present

## 2021-10-01 DIAGNOSIS — Z860100 Personal history of colon polyps, unspecified: Secondary | ICD-10-CM | POA: Insufficient documentation

## 2021-10-01 MED ORDER — DULOXETINE HCL 30 MG PO CPEP: 30.0000 mg | ORAL_CAPSULE | Freq: Every day | ORAL | 11 refills | Status: DC

## 2021-10-01 NOTE — Progress Notes (Signed)
? ?Chief Complaint  ?Patient presents with  ? Follow-up  ?  Rm 14. Accompanied by husband, Timmothy Sours. ?C/o worsening neuropathy symptoms. She sleeps on sciatic cushion. C/o pins and needles sensation on the bottom of feet. C/o weakness in left leg.  ? ? ? ? ?ASSESSMENT AND PLAN ? ?Beth Daniels is a 76 y.o. female   ?Long history of bilateral lower extremity paresthesia, neuropathic pain, ? Most consistent with small fiber neuropathy, ? Laboratory evaluation for potential treatable etiology, can potentially related to her long history of alcohol use, proportionally large amount compared to her small frame and poor appetite, ? Advise her decrease daily alcohol use, gradually ramp up her activity, ? Advance Cymbalta 30 mg daily, keep maximum dose gabapentin 2400 mg daily, may heavy dose before bedtime ? ? ?DIAGNOSTIC DATA (LABS, IMAGING, TESTING) ?- I reviewed patient records, labs, notes, testing and imaging myself where available. ? ? ?MEDICAL HISTORY: ? ?Beth Daniels is a 76 year old female, accompanied by her husband to follow-up for bilateral lower extremity neuropathy, neuropathic pain, she was a patient of Dr. Jannifer Franklin in the past, her primary care physician is Dr. Virgina Jock, [name]. ? ?I reviewed and summarized the referring note. PMHX. ?Emphysema, Home oxygen dependent ?Right, Breast cancer , bilateral mastotomy,  ?GERD ?HLD ?Right lung cancer ,s/p radiation therapy ?Peripheral vascular disease. ? ?She began to notice bilateral feet paresthesia around 2013, describes pins and needle sensation at the plantar surface, heel, and toes, per record, initial EMG nerve conduction study was normal, laboratory evaluation for treatable etiology was nonrevealing. ? ?In July 2020, she suffered left femur fracture required surgery, ? ?Since then, she noticed apparent asymmetry of lower extremity symptoms, whenever she sat on the hard surface, put the weight at her buttock region, she felt deep achiness of left calf, ? ?EMG nerve conduction  study at the [facility] in 2021 showed some active denervation on the left gastrocnemius, chronic neuropathic changes in the hamstring muscles, nerve conduction study on the left side was other wise on remarkable, in specific, sensory nerve conduction study were within normal limit. ? ?She was diagnosed with possible left sciatic nerve compression with a background of peripheral neuropathy ? ?I personally reviewed MRI lumbar October 2021, mild degenerative changes no significant canal foraminal narrowing ? ?MRI of pelvis without contrast October showed mild osteoarthritis of left hip, left femur neck ORIF with hardware in place ? ?Over the years, she has been managed by titrating dose of gabapentin currently taking 300 mg two +100 mg 2 tablets 3 times a day, equivalent to 2400 mg daily ? ?She spent most of the time in a sitting down position, her major limitation is her multiple medical history, including emphysema, right lung cancer, status post radiation therapy finished in March 2023, previously heavy smoker, she is home oxygen dependent 24/7 ? ?In addition, she had long history of alcohol use, currently has poor appetite, but drink 8 ounce x 3 throughout the day ? ?She describes early afternoon bilateral feet and lower extremity achy pain, at night difficulty staying asleep ? ? ?PHYSICAL EXAM: ?  ?Vitals:  ? 10/01/21 1435  ?BP: 137/67  ?Pulse: 93  ?Weight: 92 lb 8 oz (42 kg)  ?Height: 5\' 2"  (1.575 m)  ? ?Not recorded ?  ? ? ?Body mass index is 16.92 kg/m?. ? ?PHYSICAL EXAMNIATION: ? ?Gen: NAD, conversant, well nourised, well groomed                     ?  Cardiovascular: Regular rate rhythm, no peripheral edema, warm, nontender. ?Eyes: Conjunctivae clear without exudates or hemorrhage ?Neck: Supple, no carotid bruits. ?Pulmonary: Clear to auscultation bilaterally  ? ?NEUROLOGICAL EXAM: ? ?MENTAL STATUS: ?Speech: ?   Speech is normal; fluent and spontaneous with normal comprehension.  ?Cognition: ?     Orientation to time, place and person ?    Normal recent and remote memory ?    Normal Attention span and concentration ?    Normal Language, naming, repeating,spontaneous speech ?    Fund of knowledge ?  ?CRANIAL NERVES: ?CN II: Visual fields are full to confrontation. Pupils are round equal and briskly reactive to light. ?CN III, IV, VI: extraocular movement are normal. No ptosis. ?CN V: Facial sensation is intact to light touch ?CN VII: Face is symmetric with normal eye closure  ?CN VIII: Hearing is normal to causal conversation. ?CN IX, X: Phonation is normal. ?CN XI: Head turning and shoulder shrug are intact ? ?MOTOR: ?There is no pronator drift of out-stretched arms. Muscle bulk and tone are normal. Muscle strength is normal. ? ?REFLEXES: ?Reflexes are 1 and symmetric at the biceps, triceps, 1 knees, and absent at ankles. Plantar responses are flexor. ? ?SENSORY: ?Intact to light touch, pinprick and vibratory sensation are intact in fingers and toes. ? ?COORDINATION: ?There is no trunk or limb dysmetria noted. ? ?GAIT/STANCE: She needs push-up to get up from seated position, cautious, mildly unsteady ? ?REVIEW OF SYSTEMS:  ?Full 14 system review of systems performed and notable only for as above ?All other review of systems were negative. ? ? ?ALLERGIES: ?No Known Allergies ? ?HOME MEDICATIONS: ?Current Outpatient Medications  ?Medication Sig Dispense Refill  ? albuterol (PROVENTIL) (2.5 MG/3ML) 0.083% nebulizer solution Take 3 mLs (2.5 mg total) by nebulization every 4 (four) hours as needed for wheezing or shortness of breath. 75 mL 3  ? albuterol (VENTOLIN HFA) 108 (90 Base) MCG/ACT inhaler USE 1 INHALATION EVERY 6 HOURS AS NEEDED FOR WHEEZING OR SHORTNESS OF BREATH 17 g 3  ? ANORO ELLIPTA 62.5-25 MCG/ACT AEPB USE 1 INHALATION DAILY 180 each 3  ? aspirin EC 81 MG tablet Take 81 mg by mouth daily.    ? atorvastatin (LIPITOR) 40 MG tablet     ? azelastine (ASTELIN) 0.1 % nasal spray Place 1 spray into both  nostrils 2 (two) times daily. 30 mL 6  ? Azelastine-Fluticasone 137-50 MCG/ACT SUSP Place 1 spray into the nose daily. (Patient taking differently: Place 1 spray into the nose 2 (two) times daily.) 23 g 6  ? conjugated estrogens (PREMARIN) vaginal cream Place 1 Applicatorful vaginally every Monday, Wednesday, and Friday at 8 PM.    ? Cranberry 400 MG CAPS Take 1,200 mg by mouth daily at 2 PM.    ? denosumab (PROLIA) 60 MG/ML SOSY injection Inject 60 mg into the skin every 6 (six) months.     ? docusate sodium (COLACE) 100 MG capsule Take 1 capsule (100 mg total) by mouth 2 (two) times daily. (Patient taking differently: Take 100 mg by mouth at bedtime.) 10 capsule 0  ? Ferrous Fumarate (HEMOCYTE - 106 MG FE) 324 (106 Fe) MG TABS tablet Take 1 tablet by mouth daily at 3 pm.     ? fluticasone (FLONASE) 50 MCG/ACT nasal spray Place 2 sprays into both nostrils 2 (two) times daily. 16 mL 3  ? folic acid (FOLVITE) 1 MG tablet Take 1 mg by mouth daily.    ? gabapentin (NEURONTIN) 100 MG  capsule Take 2 capsules (200 mg total) by mouth 3 (three) times daily. 540 capsule 3  ? gabapentin (NEURONTIN) 300 MG capsule Take 2 capsules (600 mg total) by mouth 3 (three) times daily. 540 capsule 3  ? guaiFENesin (MUCINEX) 600 MG 12 hr tablet Take 600 mg by mouth 2 (two) times daily.    ? hydrocortisone 2.5 % cream Apply 1 application topically daily.    ? Multiple Vitamin (MULTI-VITAMIN) tablet Take 1 tablet by mouth daily.     ? mupirocin ointment (BACTROBAN) 2 % Apply 1 application topically daily as needed (wound care).     ? MYRBETRIQ 50 MG TB24 tablet Take 50 mg by mouth at bedtime.    ? nicotine polacrilex (COMMIT) 4 MG lozenge Take 4 mg by mouth as needed for smoking cessation.     ? OXYGEN Inhale 2 L into the lungs continuous.    ? pantoprazole (PROTONIX) 40 MG tablet Take 40 mg by mouth daily.    ? sodium chloride (OCEAN) 0.65 % SOLN nasal spray Place 1 spray into both nostrils 2 (two) times daily. As needed during the day     ? Vitamin D, Cholecalciferol, 25 MCG (1000 UT) TABS Take 1,000 Units by mouth daily.    ? ?No current facility-administered medications for this visit.  ? ? ?PAST MEDICAL HISTORY: ?Past Medical History:  ?Diag

## 2021-10-02 ENCOUNTER — Encounter: Payer: Self-pay | Admitting: Neurology

## 2021-10-03 ENCOUNTER — Encounter: Payer: Self-pay | Admitting: Pulmonary Disease

## 2021-10-03 MED ORDER — FLUTICASONE PROPIONATE 50 MCG/ACT NA SUSP: 2.0000 | Freq: Two times a day (BID) | NASAL | 3 refills | Status: DC

## 2021-10-03 MED ORDER — FLUTICASONE PROPIONATE 50 MCG/ACT NA SUSP: 2.0000 | Freq: Two times a day (BID) | NASAL | 0 refills | Status: DC

## 2021-10-03 NOTE — Telephone Encounter (Signed)
One Flonase refill has been sent to [facility] and a 90 day supply has been sent to [facility].  ?

## 2021-10-05 LAB — COMPREHENSIVE METABOLIC PANEL
ALT: 17 IU/L (ref 0–32)
AST: 33 IU/L (ref 0–40)
Albumin/Globulin Ratio: 2 (ref 1.2–2.2)
Albumin: 4.8 g/dL — ABNORMAL HIGH (ref 3.7–4.7)
Alkaline Phosphatase: 70 IU/L (ref 44–121)
BUN/Creatinine Ratio: 9 — ABNORMAL LOW (ref 12–28)
BUN: 4 mg/dL — ABNORMAL LOW (ref 8–27)
Bilirubin Total: 0.4 mg/dL (ref 0.0–1.2)
CO2: 32 mmol/L — ABNORMAL HIGH (ref 20–29)
Calcium: 9.6 mg/dL (ref 8.7–10.3)
Chloride: 91 mmol/L — ABNORMAL LOW (ref 96–106)
Creatinine, Ser: 0.47 mg/dL — ABNORMAL LOW (ref 0.57–1.00)
Globulin, Total: 2.4 g/dL (ref 1.5–4.5)
Glucose: 81 mg/dL (ref 70–99)
Potassium: 4.5 mmol/L (ref 3.5–5.2)
Sodium: 135 mmol/L (ref 134–144)
Total Protein: 7.2 g/dL (ref 6.0–8.5)
eGFR: 99 mL/min/{1.73_m2} (ref >59)

## 2021-10-05 LAB — CBC WITH DIFFERENTIAL
Basophils Absolute: 0 10*3/uL (ref 0.0–0.2)
Basos: 0 %
EOS (ABSOLUTE): 0 10*3/uL (ref 0.0–0.4)
Eos: 1 %
Hematocrit: 32.5 % — ABNORMAL LOW (ref 34.0–46.6)
Hemoglobin: 11.5 g/dL (ref 11.1–15.9)
Immature Grans (Abs): 0 10*3/uL (ref 0.0–0.1)
Immature Granulocytes: 1 %
Lymphocytes Absolute: 0.5 10*3/uL — ABNORMAL LOW (ref 0.7–3.1)
Lymphs: 14 %
MCH: 34.6 pg — ABNORMAL HIGH (ref 26.6–33.0)
MCHC: 35.4 g/dL (ref 31.5–35.7)
MCV: 98 fL — ABNORMAL HIGH (ref 79–97)
Monocytes Absolute: 0.6 10*3/uL (ref 0.1–0.9)
Monocytes: 17 %
Neutrophils Absolute: 2.5 10*3/uL (ref 1.4–7.0)
Neutrophils: 67 %
RBC: 3.32 x10E6/uL — ABNORMAL LOW (ref 3.77–5.28)
RDW: 11.8 % (ref 11.7–15.4)
WBC: 3.8 10*3/uL (ref 3.4–10.8)

## 2021-10-05 LAB — MULTIPLE MYELOMA PANEL, SERUM
Albumin SerPl Elph-Mcnc: 4.5 g/dL — ABNORMAL HIGH (ref 2.9–4.4)
Albumin/Glob SerPl: 1.7 (ref 0.7–1.7)
Alpha 1: 0.3 g/dL (ref 0.0–0.4)
Alpha2 Glob SerPl Elph-Mcnc: 0.6 g/dL (ref 0.4–1.0)
B-Globulin SerPl Elph-Mcnc: 0.8 g/dL (ref 0.7–1.3)
Gamma Glob SerPl Elph-Mcnc: 1 g/dL (ref 0.4–1.8)
Globulin, Total: 2.7 g/dL (ref 2.2–3.9)
IgA/Immunoglobulin A, Serum: 349 mg/dL (ref 64–422)
IgG (Immunoglobin G), Serum: 1050 mg/dL (ref 586–1602)
IgM (Immunoglobulin M), Srm: 201 mg/dL (ref 26–217)

## 2021-10-05 LAB — C-REACTIVE PROTEIN: CRP: 1 mg/L (ref 0–10)

## 2021-10-05 LAB — ANA W/REFLEX IF POSITIVE
Anti JO-1: <0.2 AI (ref 0.0–0.9)
Anti Nuclear Antibody (ANA): POSITIVE — AB
Centromere Ab Screen: <0.2 AI (ref 0.0–0.9)
Chromatin Ab SerPl-aCnc: <0.2 AI (ref 0.0–0.9)
ENA RNP Ab: <0.2 AI (ref 0.0–0.9)
ENA SM Ab Ser-aCnc: <0.2 AI (ref 0.0–0.9)
ENA SSA (RO) Ab: <0.2 AI (ref 0.0–0.9)
ENA SSB (LA) Ab: <0.2 AI (ref 0.0–0.9)
Scleroderma (Scl-70) (ENA) Antibody, IgG: <0.2 AI (ref 0.0–0.9)
dsDNA Ab: 52 IU/mL — ABNORMAL HIGH (ref 0–9)

## 2021-10-05 LAB — HGB A1C W/O EAG: Hgb A1c MFr Bld: 4.8 % (ref 4.8–5.6)

## 2021-10-05 LAB — CK: Total CK: 74 U/L (ref 32–182)

## 2021-10-05 LAB — SEDIMENTATION RATE: Sed Rate: 2 mm/hr (ref 0–40)

## 2021-10-05 LAB — TSH: TSH: 1.68 u[IU]/mL (ref 0.450–4.500)

## 2021-10-07 ENCOUNTER — Telehealth: Payer: Self-pay | Admitting: Neurology

## 2021-10-07 NOTE — Telephone Encounter (Signed)
Please call patient, laboratory evaluation showed ? ?-- Positive ANA, with double-stranded DNA, could indicate connective tissue disorder, may repeat test, I have forwarded lab result to her primary care doctor Shon Baton, MD, she may contact him for repeat laboratory evaluation ? ?-Rest of the laboratory evaluation showed no significant abnormalities. ?

## 2021-10-07 NOTE — Telephone Encounter (Signed)
I called patient. I discussed her lab results and recommendations with her. She will contact Dr. Keane Police office for repeat labs. She asked that this result note be sent to her via mychart. Pt verbalized understanding of results. Pt had no questions at this time but was encouraged to call back if questions arise. ? ?

## 2021-10-08 ENCOUNTER — Encounter (HOSPITAL_COMMUNITY): Payer: Self-pay

## 2021-10-08 ENCOUNTER — Encounter: Payer: Self-pay | Admitting: Neurology

## 2021-10-08 ENCOUNTER — Ambulatory Visit (HOSPITAL_COMMUNITY): Admit: 2021-10-08 | Payer: Medicare Other | Admitting: Gastroenterology

## 2021-10-08 SURGERY — COLONOSCOPY WITH PROPOFOL
Anesthesia: Monitor Anesthesia Care

## 2021-10-15 ENCOUNTER — Other Ambulatory Visit: Payer: Self-pay | Admitting: *Deleted

## 2021-10-15 MED ORDER — GABAPENTIN 100 MG PO CAPS: 200.0000 mg | ORAL_CAPSULE | Freq: Three times a day (TID) | ORAL | 3 refills | Status: DC

## 2021-10-15 MED ORDER — GABAPENTIN 300 MG PO CAPS: 600.0000 mg | ORAL_CAPSULE | Freq: Three times a day (TID) | ORAL | 3 refills | Status: DC

## 2021-10-17 ENCOUNTER — Other Ambulatory Visit: Payer: Self-pay

## 2021-10-21 ENCOUNTER — Ambulatory Visit
Admission: RE | Admit: 2021-10-21 | Discharge: 2021-10-21 | Disposition: A | Discharge status: Home / Self Care | Payer: Medicare Other | Source: Ambulatory Visit | Attending: Radiation Oncology | Admitting: Radiation Oncology

## 2021-10-21 DIAGNOSIS — C3432 Malignant neoplasm of lower lobe, left bronchus or lung: Secondary | ICD-10-CM

## 2021-10-21 NOTE — Progress Notes (Signed)
?  Radiation Oncology         [phone] ?________________________________ ? ?Name: Beth Daniels MRN: [id number]  ?Date of Service: 10/21/2021  DOB: 1945-07-20 ? ?Post Treatment Telephone Note ? ?Diagnosis:   Putative Stage IA2, cT1bN0M0, NSCLC of the LLL along the level of the lingula. ?  ?Indication for treatment:  Curative      ?  ?Radiation treatment dates:   09/10/21-09/17/21 ?  ?Site/dose:   The tumor in the LLL was treated with a course of stereotactic body radiation treatment. The patient received 54 Gy In 3 fractions at 18 G per fraction. ?  ?Narrative: The patient tolerated radiation treatment relatively well.   The patient did not have any signs of acute toxicity during treatment. She does have other nodules that need to be followed. She reports she is doing well but has been very fatigued and reports she is trying to work with her neurologist on neuropathic pain and that she had difficulty tolerating Cymbalta.  ? ? ? ?Impression/Plan: ?1. Putative Stage IA2, cT1bN0M0, NSCLC of the LLL along the level of the lingula. The patient has been doing well since completion of radiotherapy. We discussed that we would plan to follow up with her CT scan that I've ordered and that I will call her to review. If stable we would then move to 6 month surveillance per NCCN guidelines.  ?2. Fatigue and neuropathic pain. She may still have some fatigue as a result of radiation which should be self limiting, but is difficult to dissect out due to her other ongoing medications and treatment of neuropathy. We will follow this expectantly. ? ? ? ? ?Carola Rhine, PAC  ? ? ? ? ?

## 2021-11-08 ENCOUNTER — Ambulatory Visit (HOSPITAL_COMMUNITY)
Admission: RE | Admit: 2021-11-08 | Discharge: 2021-11-08 | Disposition: A | Discharge status: Home / Self Care | Payer: Medicare Other | Source: Ambulatory Visit | Attending: Radiation Oncology | Admitting: Radiation Oncology

## 2021-11-08 DIAGNOSIS — C3432 Malignant neoplasm of lower lobe, left bronchus or lung: Secondary | ICD-10-CM | POA: Diagnosis not present

## 2021-11-08 LAB — POCT I-STAT CREATININE: Creatinine, Ser: 0.7 mg/dL (ref 0.44–1.00)

## 2021-11-08 MED ORDER — IOHEXOL 300 MG/ML  SOLN
75.0000 mL | Freq: Once | INTRAMUSCULAR | Status: AC | PRN
  Administered 2021-11-08: 75 mL via INTRAVENOUS

## 2021-11-11 ENCOUNTER — Telehealth: Payer: Self-pay | Admitting: Pulmonary Disease

## 2021-11-11 ENCOUNTER — Telehealth: Payer: Self-pay | Admitting: Radiation Oncology

## 2021-11-11 DIAGNOSIS — C3432 Malignant neoplasm of lower lobe, left bronchus or lung: Secondary | ICD-10-CM

## 2021-11-11 NOTE — Telephone Encounter (Signed)
Patient called regarding CT scan ordered by Shona Simpson, NP that was done on Friday and is upset that she has not gotten results. Informed patient that the doctor has not resulted the scan and she is very upset. ? ?Please call patient back at [phone]. ?

## 2021-11-11 NOTE — Telephone Encounter (Signed)
I called and let the patient know her CT showed stable post treatment changes. We will plan a repeat scan in 6 months and continue at that rate per NCCN guidelines. She is also trying to get a follow up appt with pulmonary for long term follow up as well.  ?

## 2021-11-12 NOTE — Telephone Encounter (Signed)
Called and spoke with patient. She stated that she had a CT scan on 11/08/21 that was ordered by Shona Simpson PA. She received a call from [facility] yesterday and was expecting a call from our office as well. I did make her aware that BI had been working at the hospital recently.  ? ?She would like Dr. Valeta Harms to look at the CT and give his impression. She also wanted to know when she needed to follow up with him as well.  ? ?Dr. Valeta Harms, can you please advise? Thanks!  ?

## 2021-11-14 NOTE — Telephone Encounter (Signed)
Called and spoke with patient. She verbalized understanding. She wanted to get scheduled for a OV with Dr. Valeta Harms to discuss the scan results in detail. I was able to get her scheduled for 12/05/21 at 1130am with Dr. Valeta Harms.  ? ?Nothing further needed at time of call.  ?

## 2021-11-25 ENCOUNTER — Ambulatory Visit (HOSPITAL_COMMUNITY)
Admission: RE | Admit: 2021-11-25 | Discharge: 2021-11-25 | Disposition: A | Discharge status: Home / Self Care | Payer: Medicare Other | Source: Ambulatory Visit | Attending: Surgery | Admitting: Surgery

## 2021-11-25 ENCOUNTER — Ambulatory Visit (INDEPENDENT_AMBULATORY_CARE_PROVIDER_SITE_OTHER): Payer: Medicare Other | Admitting: Physician Assistant

## 2021-11-25 ENCOUNTER — Encounter: Payer: Self-pay | Admitting: Physician Assistant

## 2021-11-25 ENCOUNTER — Ambulatory Visit (INDEPENDENT_AMBULATORY_CARE_PROVIDER_SITE_OTHER)
Admission: RE | Admit: 2021-11-25 | Discharge: 2021-11-25 | Disposition: A | Discharge status: Home / Self Care | Payer: Medicare Other | Source: Ambulatory Visit | Attending: Surgery | Admitting: Surgery

## 2021-11-25 VITALS — BP 134/73 | HR 85 | Temp 98.0°F | Resp 14 | Ht 62.0 in | Wt 98.0 lb

## 2021-11-25 DIAGNOSIS — I70213 Atherosclerosis of native arteries of extremities with intermittent claudication, bilateral legs: Secondary | ICD-10-CM | POA: Diagnosis present

## 2021-11-25 NOTE — Progress Notes (Signed)
[facility] [place] HISTORY AND PHYSICAL   History of Present Illness:  Patient is a 76 y.o. year old female who presents for evaluation of claudication/PAD.   She had previously undergone multiple percutaneous interventions at [facility].  She underwent left superficial femoral artery stenting April 2018.  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  She subsequently had drug-coated balloon angioplasty for in-stent restenosis in March and again in November 2019 with cutting Balloon angioplasty of the left SFA and balloon angioplasty of the right external iliac artery.  She then underwent drug-eluting stent placement of left SFA due to in-stent stenosis with mechanical thrombectomy of left ATA and PTA by Dr. Trula Slade in September 2020.    She denies rest pain, claudication or non healing wounds.  He mobility is limited by COPD and O2 demands.  Past Medical History:  Diagnosis Date   Alcohol abuse    Anemia    Cancer ([place])    breast right   Chronic hyponatremia    COPD (chronic obstructive pulmonary disease) (HCC)    DCIS (ductal carcinoma in situ)    Dyspnea    Emphysema of lung (HCC)    Osteoporosis    Overactive bladder    PVD (peripheral vascular disease) ([place])    Tobacco use     Past Surgical History:  Procedure Laterality Date   ABDOMINAL AORTOGRAM W/LOWER EXTREMITY N/A 03/15/2019   Procedure: ABDOMINAL AORTOGRAM W/LOWER EXTREMITY;  Surgeon: Serafina Mitchell, MD;  Location: [place] [facility];  Service: Cardiovascular;  Laterality: N/A;   aeptoplasty     BREAST SURGERY     BRONCHIAL BRUSHINGS  02/12/2021   Procedure: BRONCHIAL BRUSHINGS;  Surgeon: Garner Nash, DO;  Location: [place] [facility];  Service: Pulmonary;;   BRONCHIAL NEEDLE ASPIRATION BIOPSY  02/12/2021   Procedure: BRONCHIAL NEEDLE ASPIRATION BIOPSIES;  Surgeon: Garner Nash, DO;  Location: [place] [facility];  Service: Pulmonary;;   cartaract extraction      EYE SURGERY Bilateral    Cataract in R & L eye and Retina surgery after cataract surgery in L eye   FIDUCIAL MARKER PLACEMENT  02/12/2021   Procedure: FIDUCIAL MARKER PLACEMENT;  Surgeon: Garner Nash, DO;  Location: [place] [facility];  Service: Pulmonary;;   INTRAMEDULLARY (IM) NAIL INTERTROCHANTERIC Left 01/30/2019   Procedure: INTRAMEDULLARY (IM) NAIL INTERTROCHANTRIC;  Surgeon: Frederik Pear, MD;  Location: [place];  Service: Orthopedics;  Laterality: Left;   IR FIBRIN GLUE REPAIR ANAL FISTULA     MASTECTOMY     s/p bl   PERIPHERAL VASCULAR INTERVENTION  03/15/2019   Procedure: PERIPHERAL VASCULAR INTERVENTION;  Surgeon: Serafina Mitchell, MD;  Location: [place] [facility];  Service: Cardiovascular;;  Lt. SFA   PERIPHERAL VASCULAR THROMBECTOMY  03/15/2019   Procedure: PERIPHERAL VASCULAR THROMBECTOMY;  Surgeon: Serafina Mitchell, MD;  Location: [place] [facility];  Service: Cardiovascular;;  Lt. AT, PT   RETINAL DETACHMENT SURGERY     sclerotherapy vein leg     VASCULAR SURGERY     VIDEO BRONCHOSCOPY WITH ENDOBRONCHIAL NAVIGATION Bilateral 02/12/2021   Procedure: VIDEO BRONCHOSCOPY WITH ENDOBRONCHIAL NAVIGATION;  Surgeon: Garner Nash, DO;  Location: [place];  Service: Pulmonary;  Laterality: Bilateral;  ION   VIDEO BRONCHOSCOPY WITH RADIAL ENDOBRONCHIAL ULTRASOUND  02/12/2021   Procedure: RADIAL ENDOBRONCHIAL ULTRASOUND;  Surgeon: Garner Nash, DO;  Location: [place] [facility];  Service: Pulmonary;;   vitreous retinal surgery      ROS:   General:  No weight loss, Fever, chills  HEENT: No recent headaches, no nasal bleeding, no visual changes, no sore throat  Neurologic: No dizziness, blackouts, seizures. No recent symptoms of stroke or mini- stroke. No recent episodes of slurred speech, or temporary blindness.  Cardiac: No recent episodes of chest pain/pressure, no shortness of breath at rest.  No shortness of breath with exertion.  Denies history of atrial fibrillation or irregular  heartbeat  Vascular: No history of rest pain in feet.  No history of claudication.  No history of non-healing ulcer, No history of DVT   Pulmonary: No home oxygen, no productive cough, no hemoptysis,  No asthma or wheezing  Musculoskeletal:  [ ]  Arthritis, [ ]  Low back pain,  [ ]  Joint pain  Hematologic:No history of hypercoagulable state.  No history of easy bleeding.  No history of anemia  Gastrointestinal: No hematochezia or melena,  No gastroesophageal reflux, no trouble swallowing  Urinary: [ ]  chronic Kidney disease, [ ]  on HD - [ ]  MWF or [ ]  TTHS, [ ]  Burning with urination, [ ]  Frequent urination, [ ]  Difficulty urinating;   Skin: No rashes  Psychological: No history of anxiety,  No history of depression  Social History Social History   Tobacco Use   Smoking status: Former    Packs/day: 1.50    Years: 50.00    Pack years: 75.00    Types: Cigarettes    Start date: 73    Quit date: 10/06/2016    Years since quitting: 5.1   Smokeless tobacco: Never  Vaping Use   Vaping Use: Never used  Substance Use Topics   Alcohol use: Yes    Alcohol/week: 28.0 standard drinks    Types: 28 Glasses of wine per week    Comment: 4 glasses wine a night   Drug use: Not Currently    Family History Family History  Problem Relation Age of Onset   Alzheimer's disease Mother    Pneumonia Father        aspiration pneumonia    Allergies  No Known Allergies   Current Outpatient Medications  Medication Sig Dispense Refill   albuterol (PROVENTIL) (2.5 MG/3ML) 0.083% nebulizer solution Take 3 mLs (2.5 mg total) by nebulization every 4 (four) hours as needed for wheezing or shortness of breath. 75 mL 3   albuterol (VENTOLIN HFA) 108 (90 Base) MCG/ACT inhaler USE 1 INHALATION EVERY 6 HOURS AS NEEDED FOR WHEEZING OR SHORTNESS OF BREATH 17 g 3   ANORO ELLIPTA 62.5-25 MCG/ACT AEPB USE 1 INHALATION DAILY 180 each 3   aspirin EC 81 MG tablet Take 81 mg by mouth daily.     atorvastatin  (LIPITOR) 40 MG tablet      azelastine (ASTELIN) 0.1 % nasal spray Place 1 spray into both nostrils 2 (two) times daily. 30 mL 6   Azelastine-Fluticasone 137-50 MCG/ACT SUSP Place 1 spray into the nose daily. (Patient taking differently: Place 1 spray into the nose 2 (two) times daily.) 23 g 6   conjugated estrogens (PREMARIN) vaginal cream Place 1 Applicatorful vaginally every Monday, Wednesday, and Friday at 8 PM.     Cranberry 400 MG CAPS Take 1,200 mg by mouth daily at 2 PM.     denosumab (PROLIA) 60 MG/ML SOSY injection Inject 60 mg into the skin every 6 (six) months.      docusate sodium (COLACE) 100 MG capsule Take 1 capsule (100 mg total) by mouth 2 (two) times daily. (Patient taking differently: Take 100 mg  by mouth at bedtime.) 10 capsule 0   Ferrous Fumarate (HEMOCYTE - 106 MG FE) 324 (106 Fe) MG TABS tablet Take 1 tablet by mouth daily at 3 pm.      fluticasone (FLONASE) 50 MCG/ACT nasal spray Place 2 sprays into both nostrils 2 (two) times daily. 16 mL 0   folic acid (FOLVITE) 1 MG tablet Take 1 mg by mouth daily.     gabapentin (NEURONTIN) 100 MG capsule Take 2 capsules (200 mg total) by mouth 3 (three) times daily. 540 capsule 3   gabapentin (NEURONTIN) 300 MG capsule Take 2 capsules (600 mg total) by mouth 3 (three) times daily. 540 capsule 3   guaiFENesin (MUCINEX) 600 MG 12 hr tablet Take 600 mg by mouth 2 (two) times daily.     hydrocortisone 2.5 % cream Apply 1 application topically daily.     Multiple Vitamin (MULTI-VITAMIN) tablet Take 1 tablet by mouth daily.      mupirocin ointment (BACTROBAN) 2 % Apply 1 application topically daily as needed (wound care).      MYRBETRIQ 50 MG TB24 tablet Take 50 mg by mouth at bedtime.     nicotine polacrilex (COMMIT) 4 MG lozenge Take 4 mg by mouth as needed for smoking cessation.      OXYGEN Inhale 2 L into the lungs continuous.     pantoprazole (PROTONIX) 40 MG tablet Take 40 mg by mouth daily.     sodium chloride (OCEAN) 0.65 % SOLN  nasal spray Place 1 spray into both nostrils 2 (two) times daily. As needed during the day     Vitamin D, Cholecalciferol, 25 MCG (1000 UT) TABS Take 1,000 Units by mouth daily.     DULoxetine (CYMBALTA) 30 MG capsule Take 1 capsule (30 mg total) by mouth daily. (Patient not taking: Reported on 11/25/2021) 30 capsule 11   No current facility-administered medications for this visit.    Physical Examination  Vitals:   11/25/21 1504  BP: 134/73  Pulse: 85  Resp: 14  Temp: 98 F (36.7 C)  SpO2: 97%  Weight: 98 lb (44.5 kg)  Height: 5\' 2"  (1.575 m)    Body mass index is 17.92 kg/m.  General:  Alert and oriented, no acute distress HEENT: Normal Neck: No bruit or JVD Pulmonary: Clear to auscultation bilaterally, O2 24 hours a day Cardiac: Regular Rate and Rhythm without murmur Abdomen: Soft, non-tender, non-distended, small palpable umbilical hernia that is compressible and not incarcerated, no scars Skin: No rash Extremity Pulses:  feet are warm to touch and without ischemic changes Musculoskeletal: No deformity or edema  Neurologic: Upper and lower extremity motor 5/5 and symmetric  DATA:  +-----------+--------+-----+--------+--------+--------+  RIGHT      PSV cm/sRatioStenosisWaveformComments  +-----------+--------+-----+--------+--------+--------+  POP Prox   37                                     +-----------+--------+-----+--------+--------+--------+  POP Distal 36                                     +-----------+--------+-----+--------+--------+--------+  ATA Distal 14                                     +-----------+--------+-----+--------+--------+--------+  PERO Distal14                                     +-----------+--------+-----+--------+--------+--------+      +-----------+--------+-----+--------+----------+-------------+  LEFT       PSV cm/sRatioStenosisWaveform  Comments        +-----------+--------+-----+--------+----------+-------------+  CFA Distal 83                   biphasic                 +-----------+--------+-----+--------+----------+-------------+  DFA        97                   biphasic                 +-----------+--------+-----+--------+----------+-------------+  SFA Prox   21                   monophasicpre occlusive  +-----------+--------+-----+--------+----------+-------------+  SFA Mid                                   stent          +-----------+--------+-----+--------+----------+-------------+  SFA Distal                                stent          +-----------+--------+-----+--------+----------+-------------+  POP Prox   37                   monophasic               +-----------+--------+-----+--------+----------+-------------+  POP Distal 37                   monophasic               +-----------+--------+-----+--------+----------+-------------+  ATA Distal 14                   monophasic               +-----------+--------+-----+--------+----------+-------------+  PTA Distal 10                   monophasicretrograde     +-----------+--------+-----+--------+----------+-------------+  PERO Distal14                   monophasic               +-----------+--------+-----+--------+----------+-------------+      Left Stent(s):  +------------------+--------+--------+------------+--------+  prox to distal SFAPSV cm/sStenosisWaveform    Comments  +------------------+--------+--------+------------+--------+  Prox to Stent     20              preocclusive          +------------------+--------+--------+------------+--------+  Proximal Stent            occluded                      +------------------+--------+--------+------------+--------+  Mid Stent                 occluded                       +------------------+--------+--------+------------+--------+  Distal Stent              occluded                      +------------------+--------+--------+------------+--------+  Distal to Stent           occluded                      +------------------+--------+--------+------------+--------+  Summary:  Left: Stent and outflow artery occlusion with reconstitution in the  popliteal artery.   ABI Findings:  +---------+------------------+-----+---------+--------+  Right    Rt Pressure (mmHg)IndexWaveform Comment   +---------+------------------+-----+---------+--------+  PTA      135               1.03 triphasic          +---------+------------------+-----+---------+--------+  DP       121               0.92 triphasic          +---------+------------------+-----+---------+--------+  Great Toe75                0.57                    +---------+------------------+-----+---------+--------+   +---------+------------------+-----+----------+-------+  Left     Lt Pressure (mmHg)IndexWaveform  Comment  +---------+------------------+-----+----------+-------+  Brachial 131                                       +---------+------------------+-----+----------+-------+  PTA      65                0.50 monophasic         +---------+------------------+-----+----------+-------+  DP       58                0.44 monophasic         +---------+------------------+-----+----------+-------+  Great Toe0                 0.00                    +---------+------------------+-----+----------+-------+   +-------+-----------+-----------+------------+------------+  ABI/TBIToday's ABIToday's TBIPrevious ABIPrevious TBI  +-------+-----------+-----------+------------+------------+  Right  1.03       0.57       0.89        0.49          +-------+-----------+-----------+------------+------------+  Left   0.5        0           0.62        0.52          +-------+-----------+-----------+------------+------------+         Previous ABI on 05/23/21.     Summary:  Right: Resting right ankle-brachial index is within normal range. No  evidence of significant right lower extremity arterial disease. The right  toe-brachial index is abnormal.   Left: Resting left ankle-brachial index indicates moderate left lower  extremity arterial disease. The left toe-brachial index is abnormal.    ASSESSMENT/PLAN:  Known PAD s/p multiple interventions at [facility] and most recent left SFA stent placement by DR. Brabham.  She remains asymptomatic for claudication, rest pain or non healing wounds.  She ambulates with a Rolator around her house.  Her mobility is limited due to her pulmonary status.  She is on O2 24/7.  We discussed that her left SFA stent has occluded.  Left: Stent and outflow artery occlusion with reconstitution in the popliteal artery. ABI's decreased from 0.6 to 0.5 on today's study.  As long as she remain asymptomatic for ischemia I suggested she continue with everyday activities.  If she develops symptoms she will call and we will schedule her for repeat angiogram with possible intervention.  Her and her husband agree with this plan. She will return  for ABI's in 6 months.  Cont medical management with ASA and Statin daily.     Roxy Horseman PA-C [facility] [phone]  MD in clinic [place]

## 2021-11-28 ENCOUNTER — Other Ambulatory Visit: Payer: Self-pay | Admitting: *Deleted

## 2021-11-28 DIAGNOSIS — I779 Disorder of arteries and arterioles, unspecified: Secondary | ICD-10-CM

## 2021-11-28 DIAGNOSIS — I70213 Atherosclerosis of native arteries of extremities with intermittent claudication, bilateral legs: Secondary | ICD-10-CM

## 2021-12-05 ENCOUNTER — Ambulatory Visit (INDEPENDENT_AMBULATORY_CARE_PROVIDER_SITE_OTHER): Payer: Medicare Other | Admitting: Pulmonary Disease

## 2021-12-05 ENCOUNTER — Encounter: Payer: Self-pay | Admitting: Pulmonary Disease

## 2021-12-05 VITALS — BP 142/68 | HR 95 | Temp 98.4°F | Ht 62.0 in | Wt 92.6 lb

## 2021-12-05 DIAGNOSIS — J9611 Chronic respiratory failure with hypoxia: Secondary | ICD-10-CM

## 2021-12-05 DIAGNOSIS — J449 Chronic obstructive pulmonary disease, unspecified: Secondary | ICD-10-CM

## 2021-12-05 DIAGNOSIS — R918 Other nonspecific abnormal finding of lung field: Secondary | ICD-10-CM | POA: Diagnosis not present

## 2021-12-05 NOTE — Patient Instructions (Signed)
Thank you for visiting Dr. Valeta Harms at [facility]. Today we recommend the following:  Anoro ellipta, continue inhaler  Continue albuterol as needed  Stay active  Follow up with radiation oncology  Return in about 6 months (around 06/06/2022) for with APP or Dr. Valeta Harms.    Please do your part to reduce the spread of COVID-19.

## 2021-12-05 NOTE — Progress Notes (Signed)
Synopsis: Referred in July 2022 for lung nodule by Shon Baton, MD  Subjective:   PATIENT ID: Beth Daniels GENDER: female DOB: 05-Jun-1946, MRN: [id number]  Chief Complaint  Patient presents with   Follow-up    This is a 76 year old female, past medical history of breast cancer on the right 9 years ago status post bilateral mastectomy, severe emphysema, chronic hypoxemic respiratory failure on 2 L nasal cannula.  Patient has had multiple CT images over the past 2+ years.  Has shown interval growth of at least 3 nodules.  She has a right upper lobe anterior nodule that has grown in size by at least 1 to 2 mm as well as a right lower lobe superior segment lesion that has grown.  And she has a left lung nodule that has grown from 4 mm to 7 mm.  From a respiratory standpoint she has been stable on her 2 L nasal cannula and compliant with her inhaler regimen.  OV 02/28/2021: Here today for follow-up regarding bronchoscopy.  All lung nodules that were sampled during bronchoscopy had no evidence of malignancy.  All were in difficult locations that are not sure that we were in the space.  We went over the imaging today in the office and explained the diagnostic yield again regarding small peripheral lesions and the utility of navigational bronchoscopy.  Most of her lesions 1 of which was in a very precarious location to access.  The right lower lobe superior segment lesion would be the 1 of which I am most concerned of being a potential malignancy due to its lobulated shape.  We also discussed this in detail.  OV 05/22/2021: Here today for follow-up after recent CT scan of the chest.  We spent a significant amount of time today discussing each of the nodules in her chest.  As well as the ones on the left side that I believe have a higher chance at having being malignant.  We also reviewed once again the details of the previous navigational bronchoscopy and the various precarious locations in which they exist.   Also discussed that the lingular nodule on the left side is subsolid in nature and has enlarged just a small amount.  I do believe that this potentially is one of the highest risks as this did show change in 3 months but only a minimal alteration in its previous size.  OV 08/15/2021: Patient here today for follow-up after multiple pulmonary nodules.  She was taken for bronchoscopy with nondiagnostic procedure in the summer.  We have been following her lung nodule since then.  Patient had a recent CT scan of the chest completed February 2023 which showed enlargement of the lingular nodule concerning for primary bronchogenic carcinoma.  Here today to discuss results and next steps.  OV 12/05/2021: Here today for follow-up.  Patient tolerated SBRT treatments very well.  This was done empirically for her nodule of concern.  They have all the nodule is never really going to go away after having radiation treatments and that we want to see that the nodule in location and of concern is stable and does not grow or change.  Overall from respiratory standpoint she is able to complete most of her activities of daily living.  She is a rather reclusive lifestyle does not have much interaction questions regarding the CT imaging and the fact that it stated it was stable.  I explained that due to her advanced lung disease.  She still wears a mask  regularly which is I think important and the fact that she does not need to get any viral URIs which could precipitate COPD exacerbation.   Past Medical History:  Diagnosis Date   Alcohol abuse    Anemia    Cancer ([place])    breast right   Chronic hyponatremia    COPD (chronic obstructive pulmonary disease) (HCC)    DCIS (ductal carcinoma in situ)    Dyspnea    Emphysema of lung (HCC)    Osteoporosis    Overactive bladder    PVD (peripheral vascular disease) ([place])    Tobacco use      Family History  Problem Relation Age of Onset   Alzheimer's disease Mother     Pneumonia Father        aspiration pneumonia     Past Surgical History:  Procedure Laterality Date   ABDOMINAL AORTOGRAM W/LOWER EXTREMITY N/A 03/15/2019   Procedure: ABDOMINAL AORTOGRAM W/LOWER EXTREMITY;  Surgeon: Serafina Mitchell, MD;  Location: [facility];  Service: Cardiovascular;  Laterality: N/A;   aeptoplasty     BREAST SURGERY     BRONCHIAL BRUSHINGS  02/12/2021   Procedure: BRONCHIAL BRUSHINGS;  Surgeon: Garner Nash, DO;  Location: [facility];  Service: Pulmonary;;   BRONCHIAL NEEDLE ASPIRATION BIOPSY  02/12/2021   Procedure: BRONCHIAL NEEDLE ASPIRATION BIOPSIES;  Surgeon: Garner Nash, DO;  Location: [facility];  Service: Pulmonary;;   cartaract extraction     EYE SURGERY Bilateral    Cataract in R & L eye and Retina surgery after cataract surgery in L eye   FIDUCIAL MARKER PLACEMENT  02/12/2021   Procedure: FIDUCIAL MARKER PLACEMENT;  Surgeon: Garner Nash, DO;  Location: [facility];  Service: Pulmonary;;   INTRAMEDULLARY (IM) NAIL INTERTROCHANTERIC Left 01/30/2019   Procedure: INTRAMEDULLARY (IM) NAIL INTERTROCHANTRIC;  Surgeon: Frederik Pear, MD;  Location: [facility];  Service: Orthopedics;  Laterality: Left;   IR FIBRIN GLUE REPAIR ANAL FISTULA     MASTECTOMY     s/p bl   PERIPHERAL VASCULAR INTERVENTION  03/15/2019   Procedure: PERIPHERAL VASCULAR INTERVENTION;  Surgeon: Serafina Mitchell, MD;  Location: [facility];  Service: Cardiovascular;;  Lt. SFA   PERIPHERAL VASCULAR THROMBECTOMY  03/15/2019   Procedure: PERIPHERAL VASCULAR THROMBECTOMY;  Surgeon: Serafina Mitchell, MD;  Location: [facility];  Service: Cardiovascular;;  Lt. AT, PT   RETINAL DETACHMENT SURGERY     sclerotherapy vein leg     VASCULAR SURGERY     VIDEO BRONCHOSCOPY WITH ENDOBRONCHIAL NAVIGATION Bilateral 02/12/2021   Procedure: VIDEO BRONCHOSCOPY WITH ENDOBRONCHIAL NAVIGATION;  Surgeon: Garner Nash, DO;  Location: [facility];  Service: Pulmonary;  Laterality:  Bilateral;  ION   VIDEO BRONCHOSCOPY WITH RADIAL ENDOBRONCHIAL ULTRASOUND  02/12/2021   Procedure: RADIAL ENDOBRONCHIAL ULTRASOUND;  Surgeon: Garner Nash, DO;  Location: [facility];  Service: Pulmonary;;   vitreous retinal surgery      Social History   Socioeconomic History   Marital status: Married    Spouse name: Elenore Rota   Number of children: Not on file   Years of education: Not on file   Highest education level: Not on file  Occupational History   Occupation: RETIRED  Tobacco Use   Smoking status: Former    Packs/day: 1.50    Years: 50.00    Pack years: 75.00    Types: Cigarettes    Start date: 1970    Quit date: 10/06/2016    Years since  quitting: 5.1   Smokeless tobacco: Never  Vaping Use   Vaping Use: Never used  Substance and Sexual Activity   Alcohol use: Yes    Alcohol/week: 28.0 standard drinks    Types: 28 Glasses of wine per week    Comment: 4 glasses wine a night   Drug use: Not Currently   Sexual activity: Not Currently  Other Topics Concern   Not on file  Social History Narrative   Right handed,    Lives with husband   Drinks 2 cups caffeine daily   Social Determinants of Health   Financial Resource Strain: Not on file  Food Insecurity: Not on file  Transportation Needs: Not on file  Physical Activity: Not on file  Stress: Not on file  Social Connections: Not on file  Intimate Partner Violence: Not on file     No Known Allergies   Outpatient Medications Prior to Visit  Medication Sig Dispense Refill   albuterol (PROVENTIL) (2.5 MG/3ML) 0.083% nebulizer solution Take 3 mLs (2.5 mg total) by nebulization every 4 (four) hours as needed for wheezing or shortness of breath. 75 mL 3   albuterol (VENTOLIN HFA) 108 (90 Base) MCG/ACT inhaler USE 1 INHALATION EVERY 6 HOURS AS NEEDED FOR WHEEZING OR SHORTNESS OF BREATH 17 g 3   ANORO ELLIPTA 62.5-25 MCG/ACT AEPB USE 1 INHALATION DAILY 180 each 3   aspirin EC 81 MG tablet Take 81 mg by mouth daily.      atorvastatin (LIPITOR) 40 MG tablet      azelastine (ASTELIN) 0.1 % nasal spray Place 1 spray into both nostrils 2 (two) times daily. 30 mL 6   Azelastine-Fluticasone 137-50 MCG/ACT SUSP Place 1 spray into the nose daily. (Patient taking differently: Place 1 spray into the nose 2 (two) times daily.) 23 g 6   conjugated estrogens (PREMARIN) vaginal cream Place 1 Applicatorful vaginally every Monday, Wednesday, and Friday at 8 PM.     Cranberry 400 MG CAPS Take 1,200 mg by mouth daily at 2 PM.     denosumab (PROLIA) 60 MG/ML SOSY injection Inject 60 mg into the skin every 6 (six) months.      docusate sodium (COLACE) 100 MG capsule Take 1 capsule (100 mg total) by mouth 2 (two) times daily. (Patient taking differently: Take 100 mg by mouth at bedtime.) 10 capsule 0   Ferrous Fumarate (HEMOCYTE - 106 MG FE) 324 (106 Fe) MG TABS tablet Take 1 tablet by mouth daily at 3 pm.      fluticasone (FLONASE) 50 MCG/ACT nasal spray Place 2 sprays into both nostrils 2 (two) times daily. 16 mL 0   folic acid (FOLVITE) 1 MG tablet Take 1 mg by mouth daily.     gabapentin (NEURONTIN) 100 MG capsule Take 2 capsules (200 mg total) by mouth 3 (three) times daily. 540 capsule 3   gabapentin (NEURONTIN) 300 MG capsule Take 2 capsules (600 mg total) by mouth 3 (three) times daily. 540 capsule 3   guaiFENesin (MUCINEX) 600 MG 12 hr tablet Take 600 mg by mouth 2 (two) times daily.     hydrocortisone 2.5 % cream Apply 1 application topically daily.     Multiple Vitamin (MULTI-VITAMIN) tablet Take 1 tablet by mouth daily.      mupirocin ointment (BACTROBAN) 2 % Apply 1 application topically daily as needed (wound care).      MYRBETRIQ 50 MG TB24 tablet Take 50 mg by mouth at bedtime.     nicotine polacrilex (COMMIT)  4 MG lozenge Take 4 mg by mouth as needed for smoking cessation.      OXYGEN Inhale 2 L into the lungs continuous.     pantoprazole (PROTONIX) 40 MG tablet Take 40 mg by mouth daily.     sodium chloride  (OCEAN) 0.65 % SOLN nasal spray Place 1 spray into both nostrils 2 (two) times daily. As needed during the day     Vitamin D, Cholecalciferol, 25 MCG (1000 UT) TABS Take 1,000 Units by mouth daily.     DULoxetine (CYMBALTA) 30 MG capsule Take 1 capsule (30 mg total) by mouth daily. (Patient not taking: Reported on 11/25/2021) 30 capsule 11   No facility-administered medications prior to visit.    Review of Systems  Constitutional:  Positive for weight loss. Negative for chills, fever and malaise/fatigue.  HENT:  Negative for hearing loss, sore throat and tinnitus.   Eyes:  Negative for blurred vision and double vision.  Respiratory:  Positive for shortness of breath. Negative for cough, hemoptysis, sputum production, wheezing and stridor.   Cardiovascular:  Negative for chest pain, palpitations, orthopnea, leg swelling and PND.  Gastrointestinal:  Negative for abdominal pain, constipation, diarrhea, heartburn, nausea and vomiting.  Genitourinary:  Negative for dysuria, hematuria and urgency.  Musculoskeletal:  Negative for joint pain and myalgias.  Skin:  Negative for itching and rash.  Neurological:  Negative for dizziness, tingling, weakness and headaches.  Endo/Heme/Allergies:  Negative for environmental allergies. Does not bruise/bleed easily.  Psychiatric/Behavioral:  Negative for depression. The patient is not nervous/anxious and does not have insomnia.   All other systems reviewed and are negative.   Objective:  Physical Exam Vitals reviewed.  Constitutional:      General: She is not in acute distress.    Appearance: She is well-developed.     Comments: Thin, BMI 16.9  HENT:     Head: Normocephalic and atraumatic.     Mouth/Throat:     Pharynx: No oropharyngeal exudate.  Eyes:     Conjunctiva/sclera: Conjunctivae normal.     Pupils: Pupils are equal, round, and reactive to light.  Neck:     Vascular: No JVD.     Trachea: No tracheal deviation.     Comments: Loss of  supraclavicular fat Cardiovascular:     Rate and Rhythm: Normal rate and regular rhythm.     Heart sounds: S1 normal and S2 normal.     Comments: Distant heart tones Pulmonary:     Effort: No tachypnea or accessory muscle usage.     Breath sounds: No stridor. Decreased breath sounds (throughout all lung fields) present. No wheezing, rhonchi or rales.  Abdominal:     General: There is no distension.     Palpations: Abdomen is soft.     Tenderness: There is no abdominal tenderness.  Musculoskeletal:        General: No deformity (muscle wasting ).  Skin:    General: Skin is warm and dry.     Capillary Refill: Capillary refill takes less than 2 seconds.     Findings: No rash.  Neurological:     Mental Status: She is alert and oriented to person, place, and time.  Psychiatric:        Behavior: Behavior normal.     Vitals:   12/05/21 1144  BP: (!) 142/68  Pulse: 95  Temp: 98.4 F (36.9 C)  TempSrc: Oral  SpO2: 96%  Weight: 92 lb 9.6 oz (42 kg)  Height: 5\' 2"  (1.575 m)  96% on RA BMI Readings from Last 3 Encounters:  12/05/21 16.94 kg/m  11/25/21 17.92 kg/m  10/01/21 16.92 kg/m   Wt Readings from Last 3 Encounters:  12/05/21 92 lb 9.6 oz (42 kg)  11/25/21 98 lb (44.5 kg)  10/01/21 92 lb 8 oz (42 kg)     CBC    Component Value Date/Time   WBC 3.8 10/01/2021 1542   WBC 3.8 (L) 02/12/2021 0628   RBC 3.32 (L) 10/01/2021 1542   RBC 3.53 (L) 02/12/2021 0628   HGB 11.5 10/01/2021 1542   HCT 32.5 (L) 10/01/2021 1542   PLT 201 02/12/2021 0628   MCV 98 (H) 10/01/2021 1542   MCH 34.6 (H) 10/01/2021 1542   MCH 34.0 02/12/2021 0628   MCHC 35.4 10/01/2021 1542   MCHC 33.0 02/12/2021 0628   RDW 11.8 10/01/2021 1542   LYMPHSABS 0.5 (L) 10/01/2021 1542   MONOABS 0.3 01/29/2019 1354   EOSABS 0.0 10/01/2021 1542   BASOSABS 0.0 10/01/2021 1542     Chest Imaging: 01/09/2021 CT chest: History of breast cancer, now has nodule surveillance with multiple small  pulmonary nodules that have increased in size.  Right lower lobe superior segment now 10 mm, right upper lobe 9 mm lesion as well as a lingular semisolid lesion that was 4 mm and now 7 years.  Several multiple small subsolid lesions and solid lesions that have large.  The suprasellar lesion is macrolobulated, the right upper lobe anterior lesion is smoother bordered.  We may be dealing with metachronous primary lung cancers due to the patient's history of smoking and severe emphysema. The patient's images have been independently reviewed by me.  02/05/2021 CT chest: Stable pulmonary nodules in comparison to July CT.  Lobulated superior segment lesion.  05/17/2021 CT chest super D: Multiple pulmonary nodules some subsolid in nature some rounded smooth, 1 macrolobulated.  Only one that really changes in the left lingula in comparison to the others.  I think the left-sided nodules are the 2 of which that are likely the highest risk of being malignant. The patient's images have been independently reviewed by me.    CT chest 11/11/2021: Multiple bilateral pulmonary nodules all stable in size.  Left lower lobe target lesion stable. The patient's images have been independently reviewed by me.    Pulmonary Functions Testing Results:     View : No data to display.          FeNO:   Pathology:   Echocardiogram:   Heart Catheterization:     Assessment & Plan:     ICD-10-CM   1. Multiple lung nodules on CT  R91.8     2. Chronic obstructive pulmonary disease, unspecified COPD type ([place])  J44.9     3. Chronic hypoxemic respiratory failure (HCC)  J96.11     4. Abnormal findings on diagnostic imaging of lung  R91.8        Discussion:  This is a 77 year old female, slowly enlarging multiple pulmonary nodules, they have been watched for a long time various morphologies.  The 2 within the right lung I believe are low chance of being malignancy the one within the lingula that is subsolid and  slightly larger has the highest risk and was referred for empiric SBRT.  Patient tolerated SBRT treatments well.  And has CT follow-up already scheduled with radiation oncology.  Plan: Continue follow-up with radiation oncology. CT imaging already ordered for November. Continue management of Anoro Ellipta for her inhaler regimen. Continue supplemental  oxygen support. Continue vitamin D supplement Encourage mobility, exercise as much as tolerated.   Follow-up with [place] in 6 months or as needed, myself or APP for COPD management.    Current Outpatient Medications:    albuterol (PROVENTIL) (2.5 MG/3ML) 0.083% nebulizer solution, Take 3 mLs (2.5 mg total) by nebulization every 4 (four) hours as needed for wheezing or shortness of breath., Disp: 75 mL, Rfl: 3   albuterol (VENTOLIN HFA) 108 (90 Base) MCG/ACT inhaler, USE 1 INHALATION EVERY 6 HOURS AS NEEDED FOR WHEEZING OR SHORTNESS OF BREATH, Disp: 17 g, Rfl: 3   ANORO ELLIPTA 62.5-25 MCG/ACT AEPB, USE 1 INHALATION DAILY, Disp: 180 each, Rfl: 3   aspirin EC 81 MG tablet, Take 81 mg by mouth daily., Disp: , Rfl:    atorvastatin (LIPITOR) 40 MG tablet, , Disp: , Rfl:    azelastine (ASTELIN) 0.1 % nasal spray, Place 1 spray into both nostrils 2 (two) times daily., Disp: 30 mL, Rfl: 6   Azelastine-Fluticasone 137-50 MCG/ACT SUSP, Place 1 spray into the nose daily. (Patient taking differently: Place 1 spray into the nose 2 (two) times daily.), Disp: 23 g, Rfl: 6   conjugated estrogens (PREMARIN) vaginal cream, Place 1 Applicatorful vaginally every Monday, Wednesday, and Friday at 8 PM., Disp: , Rfl:    Cranberry 400 MG CAPS, Take 1,200 mg by mouth daily at 2 PM., Disp: , Rfl:    denosumab (PROLIA) 60 MG/ML SOSY injection, Inject 60 mg into the skin every 6 (six) months. , Disp: , Rfl:    docusate sodium (COLACE) 100 MG capsule, Take 1 capsule (100 mg total) by mouth 2 (two) times daily. (Patient taking differently: Take 100 mg by mouth at bedtime.),  Disp: 10 capsule, Rfl: 0   Ferrous Fumarate (HEMOCYTE - 106 MG FE) 324 (106 Fe) MG TABS tablet, Take 1 tablet by mouth daily at 3 pm. , Disp: , Rfl:    fluticasone (FLONASE) 50 MCG/ACT nasal spray, Place 2 sprays into both nostrils 2 (two) times daily., Disp: 16 mL, Rfl: 0   folic acid (FOLVITE) 1 MG tablet, Take 1 mg by mouth daily., Disp: , Rfl:    gabapentin (NEURONTIN) 100 MG capsule, Take 2 capsules (200 mg total) by mouth 3 (three) times daily., Disp: 540 capsule, Rfl: 3   gabapentin (NEURONTIN) 300 MG capsule, Take 2 capsules (600 mg total) by mouth 3 (three) times daily., Disp: 540 capsule, Rfl: 3   guaiFENesin (MUCINEX) 600 MG 12 hr tablet, Take 600 mg by mouth 2 (two) times daily., Disp: , Rfl:    hydrocortisone 2.5 % cream, Apply 1 application topically daily., Disp: , Rfl:    Multiple Vitamin (MULTI-VITAMIN) tablet, Take 1 tablet by mouth daily. , Disp: , Rfl:    mupirocin ointment (BACTROBAN) 2 %, Apply 1 application topically daily as needed (wound care). , Disp: , Rfl:    MYRBETRIQ 50 MG TB24 tablet, Take 50 mg by mouth at bedtime., Disp: , Rfl:    nicotine polacrilex (COMMIT) 4 MG lozenge, Take 4 mg by mouth as needed for smoking cessation. , Disp: , Rfl:    OXYGEN, Inhale 2 L into the lungs continuous., Disp: , Rfl:    pantoprazole (PROTONIX) 40 MG tablet, Take 40 mg by mouth daily., Disp: , Rfl:    sodium chloride (OCEAN) 0.65 % SOLN nasal spray, Place 1 spray into both nostrils 2 (two) times daily. As needed during the day, Disp: , Rfl:    Vitamin D, Cholecalciferol, 25  MCG (1000 UT) TABS, Take 1,000 Units by mouth daily., Disp: , Rfl:    DULoxetine (CYMBALTA) 30 MG capsule, Take 1 capsule (30 mg total) by mouth daily. (Patient not taking: Reported on 11/25/2021), Disp: 30 capsule, Rfl: [place], DO [facility] 12/05/2021 12:52 PM

## 2022-01-20 ENCOUNTER — Other Ambulatory Visit (HOSPITAL_COMMUNITY): Payer: Self-pay | Admitting: *Deleted

## 2022-01-22 ENCOUNTER — Encounter (HOSPITAL_COMMUNITY)
Admission: RE | Admit: 2022-01-22 | Discharge: 2022-01-22 | Disposition: A | Discharge status: Home / Self Care | Payer: Medicare Other | Source: Ambulatory Visit | Attending: Internal Medicine | Admitting: Internal Medicine

## 2022-01-22 DIAGNOSIS — M810 Age-related osteoporosis without current pathological fracture: Secondary | ICD-10-CM | POA: Diagnosis present

## 2022-01-22 MED ORDER — DENOSUMAB 60 MG/ML ~~LOC~~ SOSY
60.0000 mg | PREFILLED_SYRINGE | Freq: Once | SUBCUTANEOUS | Status: AC
  Administered 2022-01-22: 60 mg via SUBCUTANEOUS

## 2022-01-24 ENCOUNTER — Other Ambulatory Visit: Payer: Self-pay | Admitting: Pulmonary Disease

## 2022-02-01 ENCOUNTER — Encounter: Payer: Self-pay | Admitting: Neurology

## 2022-02-26 ENCOUNTER — Other Ambulatory Visit: Payer: Self-pay | Admitting: Pulmonary Disease

## 2022-03-18 ENCOUNTER — Other Ambulatory Visit: Payer: Self-pay | Admitting: Internal Medicine

## 2022-03-18 DIAGNOSIS — K469 Unspecified abdominal hernia without obstruction or gangrene: Secondary | ICD-10-CM

## 2022-03-18 DIAGNOSIS — R109 Unspecified abdominal pain: Secondary | ICD-10-CM

## 2022-03-22 ENCOUNTER — Other Ambulatory Visit: Payer: Self-pay | Admitting: Pulmonary Disease

## 2022-04-02 ENCOUNTER — Telehealth: Payer: Self-pay | Admitting: *Deleted

## 2022-04-02 NOTE — Telephone Encounter (Signed)
CALLED PATIENT TO INFORM OF CT FOR 05-14-22- ARRIVAL TIME- 11:30 AM @ [facility], PATIENT TO HAVE WATER ONLY - 4 HRS. PRIOR TO TEST, PATIENT TO RECEIVE RESULTS FROM ALISON PERKINS ON 05-19-22 @ 2:30 PM, VIA TELEPHONE , SPOKE WITH PATIENT AND SHE IS AWARE OF THESE APPTS. AND THE INSTRUCTIONS

## 2022-04-03 ENCOUNTER — Encounter: Payer: Self-pay | Admitting: Neurology

## 2022-04-03 ENCOUNTER — Ambulatory Visit (INDEPENDENT_AMBULATORY_CARE_PROVIDER_SITE_OTHER): Payer: Medicare Other | Admitting: Neurology

## 2022-04-03 VITALS — BP 155/77 | HR 91 | Ht 62.0 in | Wt 93.5 lb

## 2022-04-03 DIAGNOSIS — G629 Polyneuropathy, unspecified: Secondary | ICD-10-CM

## 2022-04-03 DIAGNOSIS — M792 Neuralgia and neuritis, unspecified: Secondary | ICD-10-CM | POA: Diagnosis not present

## 2022-04-03 NOTE — Progress Notes (Signed)
Chief Complaint  Patient presents with   Follow-up    Rm 14. Accompanied by husband, Timmothy Sours. States neuropathy sx have been stable. C/o decreased energy. C/o bilateral hand tremors, left more than right.      ASSESSMENT AND PLAN  ERNESTA TRABERT is a 76 y.o. female   Long history of bilateral lower extremity paresthesia, neuropathic pain,  Most consistent with small fiber neuropathy,  Laboratory evaluation for potential treatable etiology, can be potentially related to her long history of heavy alcohol use,  advise her decrease daily alcohol use,   Has a short trial of Cymbalta could not tolerated due to significant GI side effect, decided to keep all current gabapentin 800 mg 3 times a day, return to clinic with nurse practitioner in 1 year    DIAGNOSTIC DATA (LABS, IMAGING, TESTING) - I reviewed patient records, labs, notes, testing and imaging myself where available.   MEDICAL HISTORY:  NOELE ICENHOUR is a 76 year old female, accompanied by her husband to follow-up for bilateral lower extremity neuropathy, neuropathic pain, she was a patient of Dr. Jannifer Franklin in the past, her primary care physician is Dr. Virgina Jock, [name].  I reviewed and summarized the referring note. PMHX. Emphysema, Home oxygen dependent, she was smoker for many years,  Right, Breast cancer , bilateral mastotomy,  GERD HLD Right lung cancer ,s/p radiation therapy Peripheral vascular disease.  She began to notice bilateral feet paresthesia around 2013, describes pins and needle sensation at the plantar surface, heel, and toes, per record, initial EMG nerve conduction study was normal, laboratory evaluation for treatable etiology was nonrevealing.  In July 2020, she suffered left femur fracture required surgery,  Since then, she noticed apparent asymmetry of lower extremity symptoms, whenever she sat on the hard surface, put the weight at her buttock region, she felt deep achiness of left calf,  EMG nerve conduction study  at the [facility] in 2021 showed some active denervation on the left gastrocnemius, chronic neuropathic changes in the hamstring muscles, nerve conduction study on the left side was other wise on remarkable, in specific, sensory nerve conduction study were within normal limit.  She was diagnosed with possible left sciatic nerve compression with a background of peripheral neuropathy  I personally reviewed MRI lumbar October 2021, mild degenerative changes no significant canal foraminal narrowing  MRI of pelvis without contrast October showed mild osteoarthritis of left hip, left femur neck ORIF with hardware in place  Over the years, she has been managed by titrating dose of gabapentin currently taking 300 mg two +100 mg 2 tablets 3 times a day, equivalent to 2400 mg daily  She spent most of the time in a sitting down position, her major limitation is her multiple medical history, including emphysema, right lung cancer, status post radiation therapy finished in March 2023, previously heavy smoker, she is home oxygen dependent 24/7  In addition, she had long history of alcohol use, currently has poor appetite, but drink 8 ounce x 3 throughout the day  She describes early afternoon bilateral feet and lower extremity achy pain, at night difficulty staying asleep  UPDATE Sept 28 2023: She continued complaints of bilateral lower extremity paresthesia, despite relative high dose of gabapentin 800 mg 3 times a day, complains of difficulty sleeping, frequent leg movement,  Worsening shortness of breath due to her lung issues, previous smoker, emphysema, lung cancer, status post radiation therapy,  She also noticed bilateral hands resting tremor, no limitation in her daily activity, no parkinsonian  features, continue to drink wines, 8 ounce 3 cups each day   PHYSICAL EXAM:   Vitals:   04/03/22 1509  BP: (!) 155/77  Pulse: 91  Weight: 93 lb 8 oz (42.4 kg)  Height: 5\' 2"  (1.575 m)   Not  recorded     Body mass index is 17.1 kg/m.  PHYSICAL EXAMNIATION:  Gen: NAD, conversant, well nourised, well groomed                     Cardiovascular: Regular rate rhythm, no peripheral edema, warm, nontender. Eyes: Conjunctivae clear without exudates or hemorrhage Neck: Supple, no carotid bruits. Pulmonary: Clear to auscultation bilaterally   NEUROLOGICAL EXAM:  MENTAL STATUS: Speech/cognition: Awake, alert, oriented to history taking and care conversation, shortness of breath with prolonged talking, nasal oxygen   CRANIAL NERVES: CN II: Visual fields are full to confrontation. Pupils are round equal and briskly reactive to light. CN III, IV, VI: extraocular movement are normal. No ptosis. CN V: Facial sensation is intact to light touch CN VII: Face is symmetric with normal eye closure  CN VIII: Hearing is normal to causal conversation. CN IX, X: Phonation is normal. CN XI: Head turning and shoulder shrug are intact  MOTOR: Mild bilateral hands postural tremor, normal strength, no rigidity no bradykinesia,  REFLEXES: Reflexes are 1 and symmetric at the biceps, triceps, 1 knees, and absent at ankles. Plantar responses are flexor.  SENSORY: Intact to light touch, pinprick and vibratory sensation are intact in fingers and toes.  COORDINATION: There is no trunk or limb dysmetria noted.  GAIT/STANCE: She needs push-up to get up from seated position, cautious, mildly unsteady  REVIEW OF SYSTEMS:  Full 14 system review of systems performed and notable only for as above All other review of systems were negative.   ALLERGIES: No Known Allergies  HOME MEDICATIONS: Current Outpatient Medications  Medication Sig Dispense Refill   albuterol (PROVENTIL) (2.5 MG/3ML) 0.083% nebulizer solution Take 3 mLs (2.5 mg total) by nebulization every 4 (four) hours as needed for wheezing or shortness of breath. 75 mL 3   albuterol (VENTOLIN HFA) 108 (90 Base) MCG/ACT inhaler USE 1  INHALATION EVERY 6 HOURS AS NEEDED FOR WHEEZING OR SHORTNESS OF BREATH 17 g 3   ANORO ELLIPTA 62.5-25 MCG/ACT AEPB USE 1 INHALATION DAILY 180 each 3   aspirin EC 81 MG tablet Take 81 mg by mouth daily.     atorvastatin (LIPITOR) 40 MG tablet      azelastine (ASTELIN) 0.1 % nasal spray USE 1 SPRAY IN EACH NOSTRIL TWICE A DAY 30 mL 6   Azelastine-Fluticasone 137-50 MCG/ACT SUSP Place 1 spray into the nose daily. (Patient taking differently: Place 1 spray into the nose 2 (two) times daily.) 23 g 6   conjugated estrogens (PREMARIN) vaginal cream Place 1 Applicatorful vaginally every Monday, Wednesday, and Friday at 8 PM.     Cranberry 400 MG CAPS Take 1,200 mg by mouth daily at 2 PM.     denosumab (PROLIA) 60 MG/ML SOSY injection Inject 60 mg into the skin every 6 (six) months.      docusate sodium (COLACE) 100 MG capsule Take 1 capsule (100 mg total) by mouth 2 (two) times daily. (Patient taking differently: Take 100 mg by mouth at bedtime.) 10 capsule 0   Ferrous Fumarate (HEMOCYTE - 106 MG FE) 324 (106 Fe) MG TABS tablet Take 1 tablet by mouth daily at 3 pm.      fluticasone (FLONASE)  50 MCG/ACT nasal spray USE 2 SPRAYS IN EACH NOSTRIL TWICE A DAY 48 g 7   folic acid (FOLVITE) 1 MG tablet Take 1 mg by mouth daily.     gabapentin (NEURONTIN) 100 MG capsule Take 2 capsules (200 mg total) by mouth 3 (three) times daily. 540 capsule 3   gabapentin (NEURONTIN) 300 MG capsule Take 2 capsules (600 mg total) by mouth 3 (three) times daily. 540 capsule 3   guaiFENesin (MUCINEX) 600 MG 12 hr tablet Take 600 mg by mouth 2 (two) times daily.     hydrocortisone 2.5 % cream Apply 1 application topically daily.     Multiple Vitamin (MULTI-VITAMIN) tablet Take 1 tablet by mouth daily.      mupirocin ointment (BACTROBAN) 2 % Apply 1 application topically daily as needed (wound care).      MYRBETRIQ 50 MG TB24 tablet Take 50 mg by mouth at bedtime.     nicotine polacrilex (COMMIT) 4 MG lozenge Take 4 mg by mouth as  needed for smoking cessation.      OXYGEN Inhale 2 L into the lungs continuous.     pantoprazole (PROTONIX) 40 MG tablet Take 40 mg by mouth daily.     sodium chloride (OCEAN) 0.65 % SOLN nasal spray Place 1 spray into both nostrils 2 (two) times daily. As needed during the day     Vitamin D, Cholecalciferol, 25 MCG (1000 UT) TABS Take 1,000 Units by mouth daily.     No current facility-administered medications for this visit.    PAST MEDICAL HISTORY: Past Medical History:  Diagnosis Date   Alcohol abuse    Anemia    Cancer ([place])    breast right   Chronic hyponatremia    COPD (chronic obstructive pulmonary disease) ([facility])    DCIS (ductal carcinoma in situ)    Dyspnea    Emphysema of lung ([facility])    Osteoporosis    Overactive bladder    PVD (peripheral vascular disease) ([place])    Tobacco use     PAST SURGICAL HISTORY: Past Surgical History:  Procedure Laterality Date   ABDOMINAL AORTOGRAM W/LOWER EXTREMITY N/A 03/15/2019   Procedure: ABDOMINAL AORTOGRAM W/LOWER EXTREMITY;  Surgeon: Serafina Mitchell, MD;  Location: [place] [facility];  Service: Cardiovascular;  Laterality: N/A;   aeptoplasty     BREAST SURGERY     BRONCHIAL BRUSHINGS  02/12/2021   Procedure: BRONCHIAL BRUSHINGS;  Surgeon: Garner Nash, DO;  Location: [place] [facility];  Service: Pulmonary;;   BRONCHIAL NEEDLE ASPIRATION BIOPSY  02/12/2021   Procedure: BRONCHIAL NEEDLE ASPIRATION BIOPSIES;  Surgeon: Garner Nash, DO;  Location: [place] [facility];  Service: Pulmonary;;   cartaract extraction     EYE SURGERY Bilateral    Cataract in R & L eye and Retina surgery after cataract surgery in L eye   FIDUCIAL MARKER PLACEMENT  02/12/2021   Procedure: FIDUCIAL MARKER PLACEMENT;  Surgeon: Garner Nash, DO;  Location: [place] [facility];  Service: Pulmonary;;   INTRAMEDULLARY (IM) NAIL INTERTROCHANTERIC Left 01/30/2019   Procedure: INTRAMEDULLARY (IM) NAIL INTERTROCHANTRIC;  Surgeon: Frederik Pear, MD;  Location: [place];  Service:  Orthopedics;  Laterality: Left;   IR FIBRIN GLUE REPAIR ANAL FISTULA     MASTECTOMY     s/p bl   PERIPHERAL VASCULAR INTERVENTION  03/15/2019   Procedure: PERIPHERAL VASCULAR INTERVENTION;  Surgeon: Serafina Mitchell, MD;  Location: [place] [facility];  Service: Cardiovascular;;  Lt. SFA   PERIPHERAL VASCULAR THROMBECTOMY  03/15/2019  Procedure: PERIPHERAL VASCULAR THROMBECTOMY;  Surgeon: Serafina Mitchell, MD;  Location: [place] [facility];  Service: Cardiovascular;;  Lt. AT, PT   RETINAL DETACHMENT SURGERY     sclerotherapy vein leg     VASCULAR SURGERY     VIDEO BRONCHOSCOPY WITH ENDOBRONCHIAL NAVIGATION Bilateral 02/12/2021   Procedure: VIDEO BRONCHOSCOPY WITH ENDOBRONCHIAL NAVIGATION;  Surgeon: Garner Nash, DO;  Location: [place];  Service: Pulmonary;  Laterality: Bilateral;  ION   VIDEO BRONCHOSCOPY WITH RADIAL ENDOBRONCHIAL ULTRASOUND  02/12/2021   Procedure: RADIAL ENDOBRONCHIAL ULTRASOUND;  Surgeon: Garner Nash, DO;  Location: [facility];  Service: Pulmonary;;   vitreous retinal surgery      FAMILY HISTORY: Family History  Problem Relation Age of Onset   Alzheimer's disease Mother    Pneumonia Father        aspiration pneumonia    SOCIAL HISTORY: Social History   Socioeconomic History   Marital status: Married    Spouse name: Elenore Rota   Number of children: Not on file   Years of education: Not on file   Highest education level: Not on file  Occupational History   Occupation: RETIRED  Tobacco Use   Smoking status: Former    Packs/day: 1.50    Years: 50.00    Total pack years: 75.00    Types: Cigarettes    Start date: 1970    Quit date: 10/06/2016    Years since quitting: 5.4   Smokeless tobacco: Never  Vaping Use   Vaping Use: Never used  Substance and Sexual Activity   Alcohol use: Yes    Alcohol/week: 28.0 standard drinks of alcohol    Types: 28 Glasses of wine per week    Comment: 4 glasses wine a night   Drug use: Not Currently   Sexual  activity: Not Currently  Other Topics Concern   Not on file  Social History Narrative   Right handed,    Lives with husband   Drinks 2 cups caffeine daily   Social Determinants of Health   Financial Resource Strain: Not on file  Food Insecurity: Not on file  Transportation Needs: Not on file  Physical Activity: Not on file  Stress: Not on file  Social Connections: Not on file  Intimate Partner Violence: Not on file      Marcial Pacas, M.D. Ph.D.  [facility] [address] [place], [place] [address] Ph: [phone] Fax: [phone]  CC:  Shon Baton, MD [place],  [place] [address]  Shon Baton, MD

## 2022-04-10 ENCOUNTER — Ambulatory Visit
Admission: RE | Admit: 2022-04-10 | Discharge: 2022-04-10 | Disposition: A | Discharge status: Home / Self Care | Payer: Medicare Other | Source: Ambulatory Visit | Attending: Internal Medicine | Admitting: Internal Medicine

## 2022-04-10 DIAGNOSIS — R109 Unspecified abdominal pain: Secondary | ICD-10-CM

## 2022-04-10 DIAGNOSIS — K469 Unspecified abdominal hernia without obstruction or gangrene: Secondary | ICD-10-CM

## 2022-04-10 MED ORDER — IOPAMIDOL (ISOVUE-300) INJECTION 61%
100.0000 mL | Freq: Once | INTRAVENOUS | Status: AC | PRN
  Administered 2022-04-10: 100 mL via INTRAVENOUS

## 2022-04-12 ENCOUNTER — Encounter: Payer: Self-pay | Admitting: Radiation Oncology

## 2022-04-15 ENCOUNTER — Encounter: Payer: Self-pay | Admitting: Pulmonary Disease

## 2022-04-16 NOTE — Telephone Encounter (Signed)
Dr. Valeta Harms, please advise. Thanks

## 2022-04-16 NOTE — Telephone Encounter (Signed)
Dr. Valeta Harms, would like your impression and opinion of this article from [facility] and their newly developed procedures to help patients with COPD, and is that something that would free up lung capacity for me.     [email or web address]   Thank you, Beth Daniels  DOB [id number]    Dr. Valeta Harms, please advise. Thanks

## 2022-04-29 IMAGING — CT CT CHEST SUPER D W/O CM
1 of 2 series · 15 of 32 positions shown, 19 images · non-contrast
Comparison: PET-CT, 02/04/2021, 01/08/2021

CLINICAL DATA: Follow-up right lower lobe nodule

EXAM:
CT CHEST WITHOUT CONTRAST
TECHNIQUE: Multidetector CT imaging of the chest was performed using thin slice
collimation for electromagnetic bronchoscopy planning purposes,
without intravenous contrast.

[Series 6: super d · axial · 0.56mm/px · z∈[-275,+1]mm · 15 of 387 slices shown, 19 images]
[im 21/387  mediastinal]
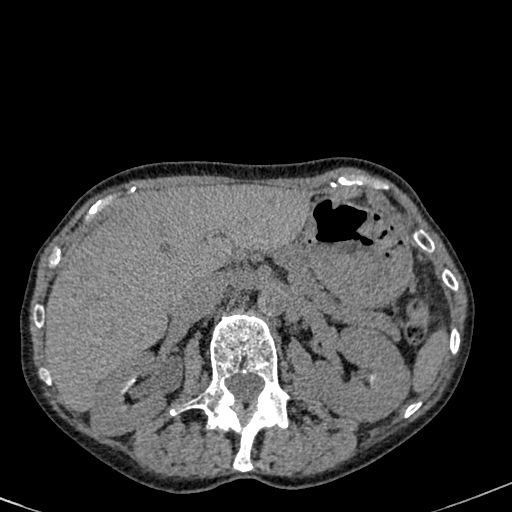
[im 21/387  lung]
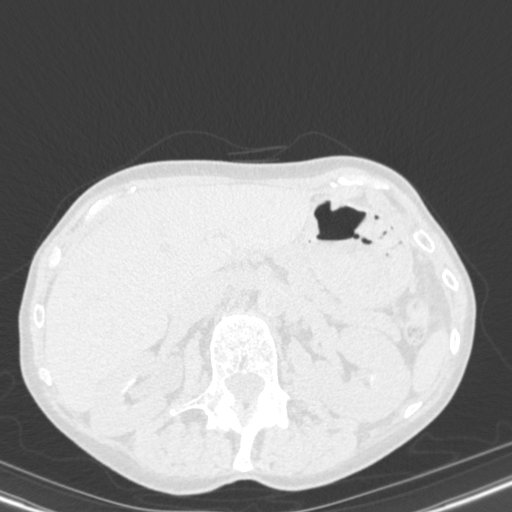
[im 61/387  lung]
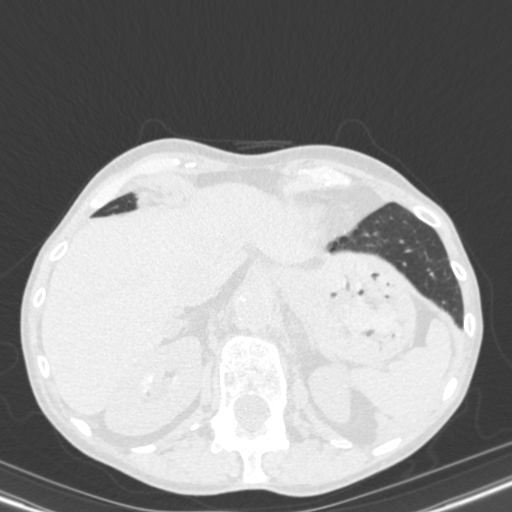
[im 82/387  lung]
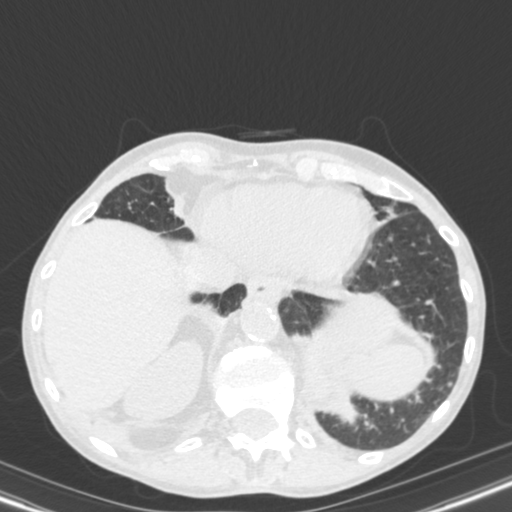
[im 102/387  lung]
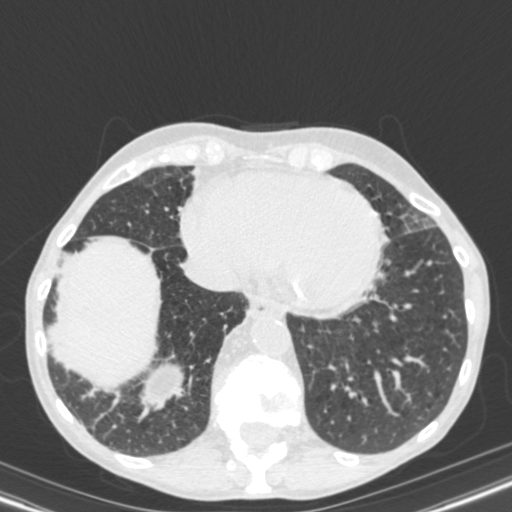
[im 129/387  mediastinal]
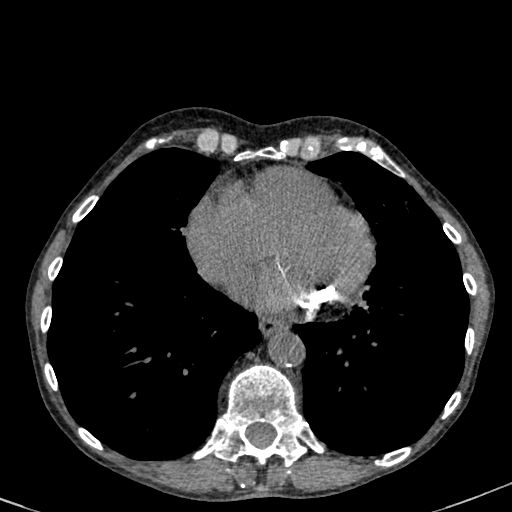
[im 129/387  lung]
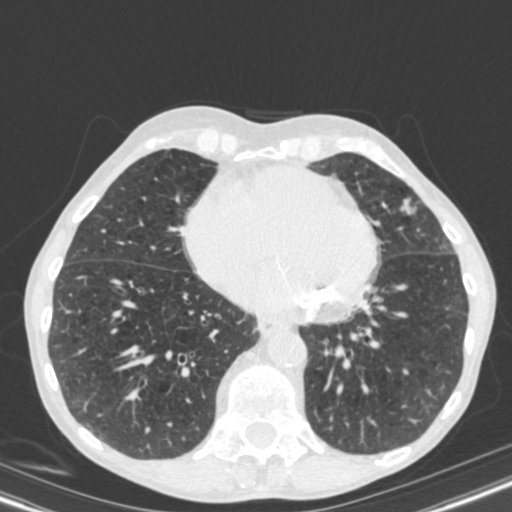
[im 143/387  lung]
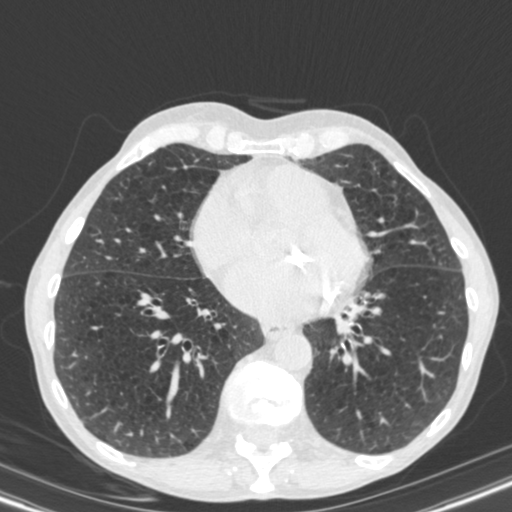
[im 182/387  lung]
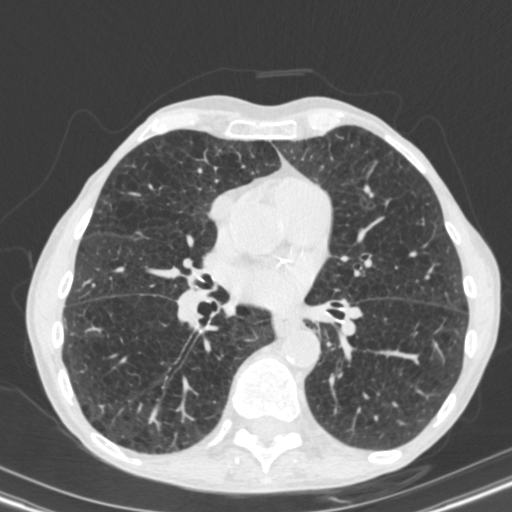
[im 183/387  lung]
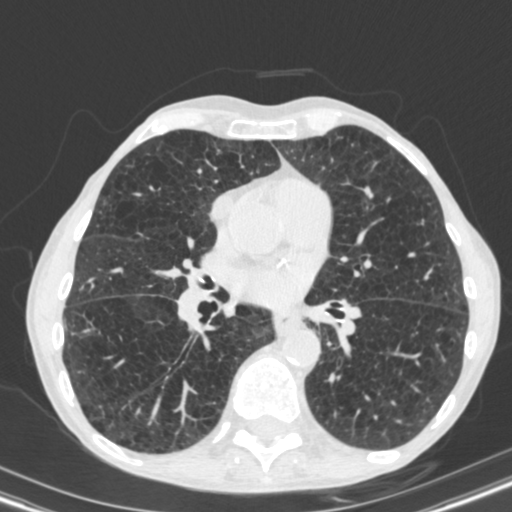
[im 204/387  mediastinal]
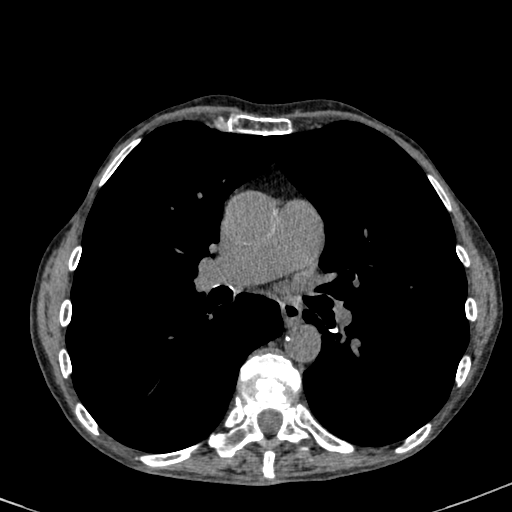
[im 204/387  lung]
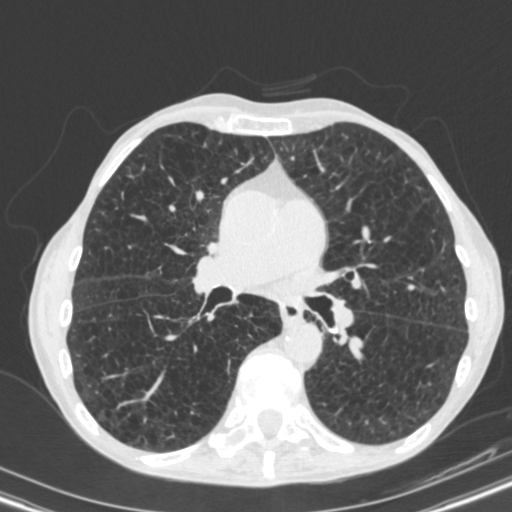
[im 244/387  lung]
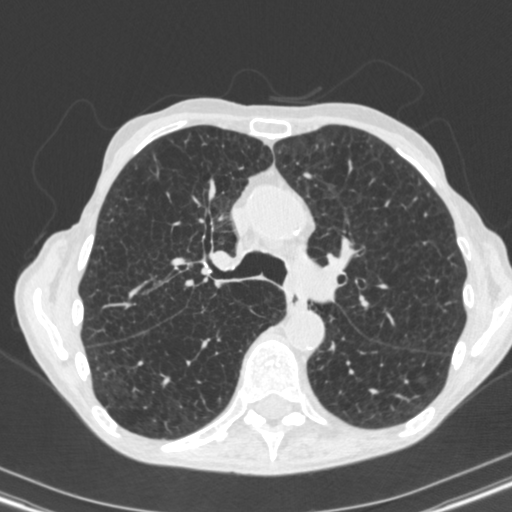
[im 258/387  lung]
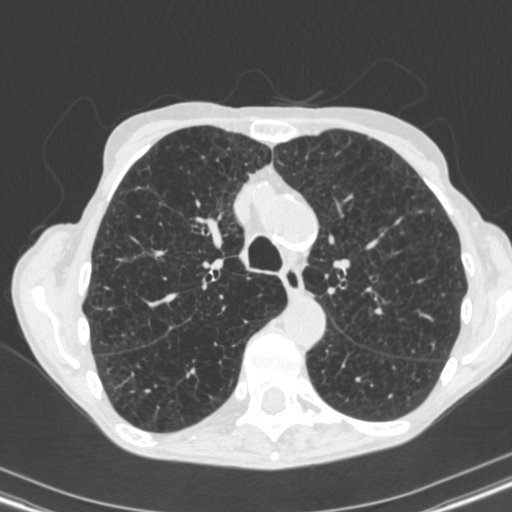
[im 285/387  lung]
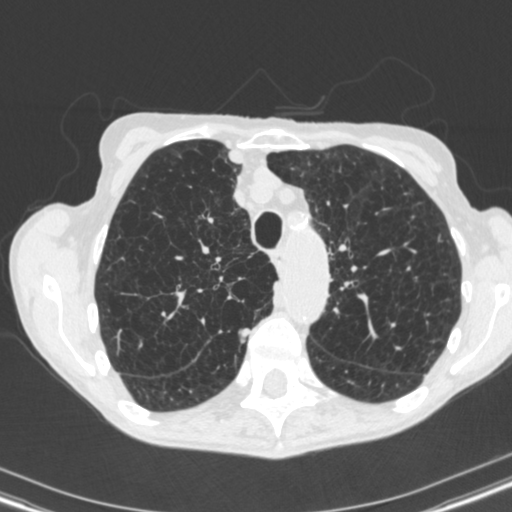
[im 305/387  mediastinal]
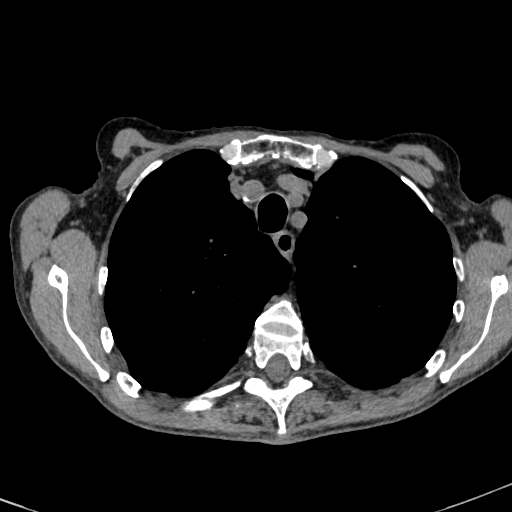
[im 305/387  lung]
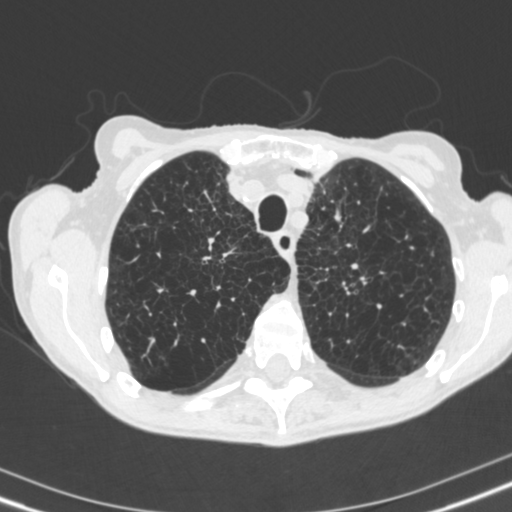
[im 326/387  lung]
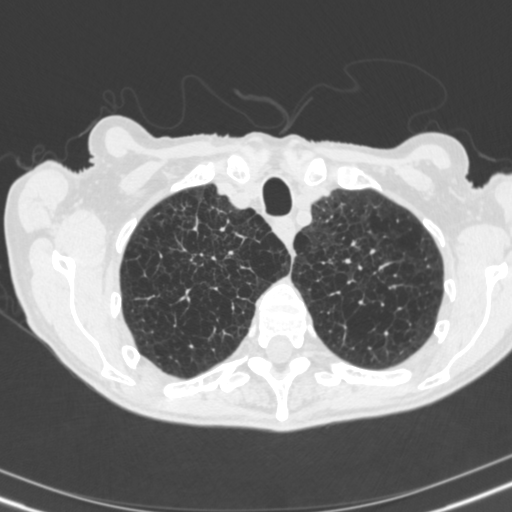
[im 366/387  lung]
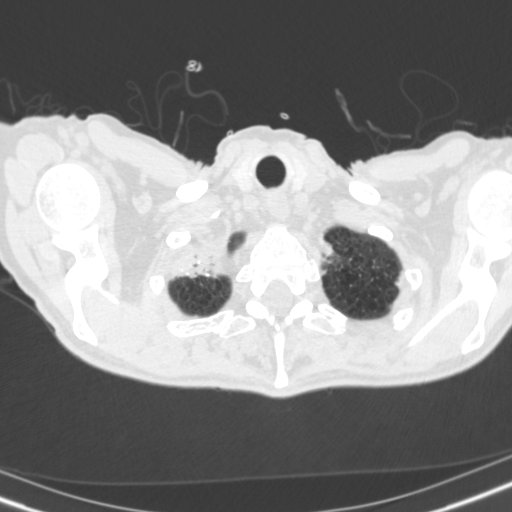

[15 of 32 positions shown; findings below may reference images not displayed]

FINDINGS: Cardiovascular: Aortic atherosclerosis. Normal heart size. Left
coronary artery calcifications. No pericardial effusion.

Mediastinum/Nodes: No enlarged mediastinal, hilar, or axillary lymph
nodes. Thyroid gland, trachea, and esophagus demonstrate no
significant findings.

Lungs/Pleura: Severe centrilobular emphysema. Multiple small
bilateral pulmonary nodules are again seen, and not significantly
changed compared to PET-CT performed 2 days prior. These include a 9
mm subpleural nodule of the medial anterior right upper lobe (series
5, image 43), a lobulated subpleural nodule of the medial superior
segment right lower lobe measuring 1.0 cm (series 5, image 41), a 7
mm nodule of the peripheral left lower lobe (series 5, image 101),
and an irregular, 7 mm nodule of the lingula (series 5, image 104).
No pleural effusion or pneumothorax.

Upper Abdomen: No acute abnormality.

Musculoskeletal: No chest wall mass or suspicious bone lesions
identified. Chronic wedge deformities of T3 and T12.
IMPRESSION: 1. Multiple small bilateral pulmonary nodules are again seen, and
not significantly changed compared to PET-CT performed 2 days prior.
2. Severe emphysema.
3. Coronary artery disease.

Aortic Atherosclerosis (LO6DQ-QIQ.Q) and Emphysema (LO6DQ-CKP.1).

## 2022-05-06 IMAGING — DX DG CHEST 1V PORT
1 series · 1 of 1 positions shown · non-contrast
Comparison: [DATE] [DATE], [DATE].  [DATE] [DATE], [DATE].

CLINICAL DATA: Status post bronchoscopy.

EXAM:
PORTABLE CHEST 1 VIEW

[chest ap]
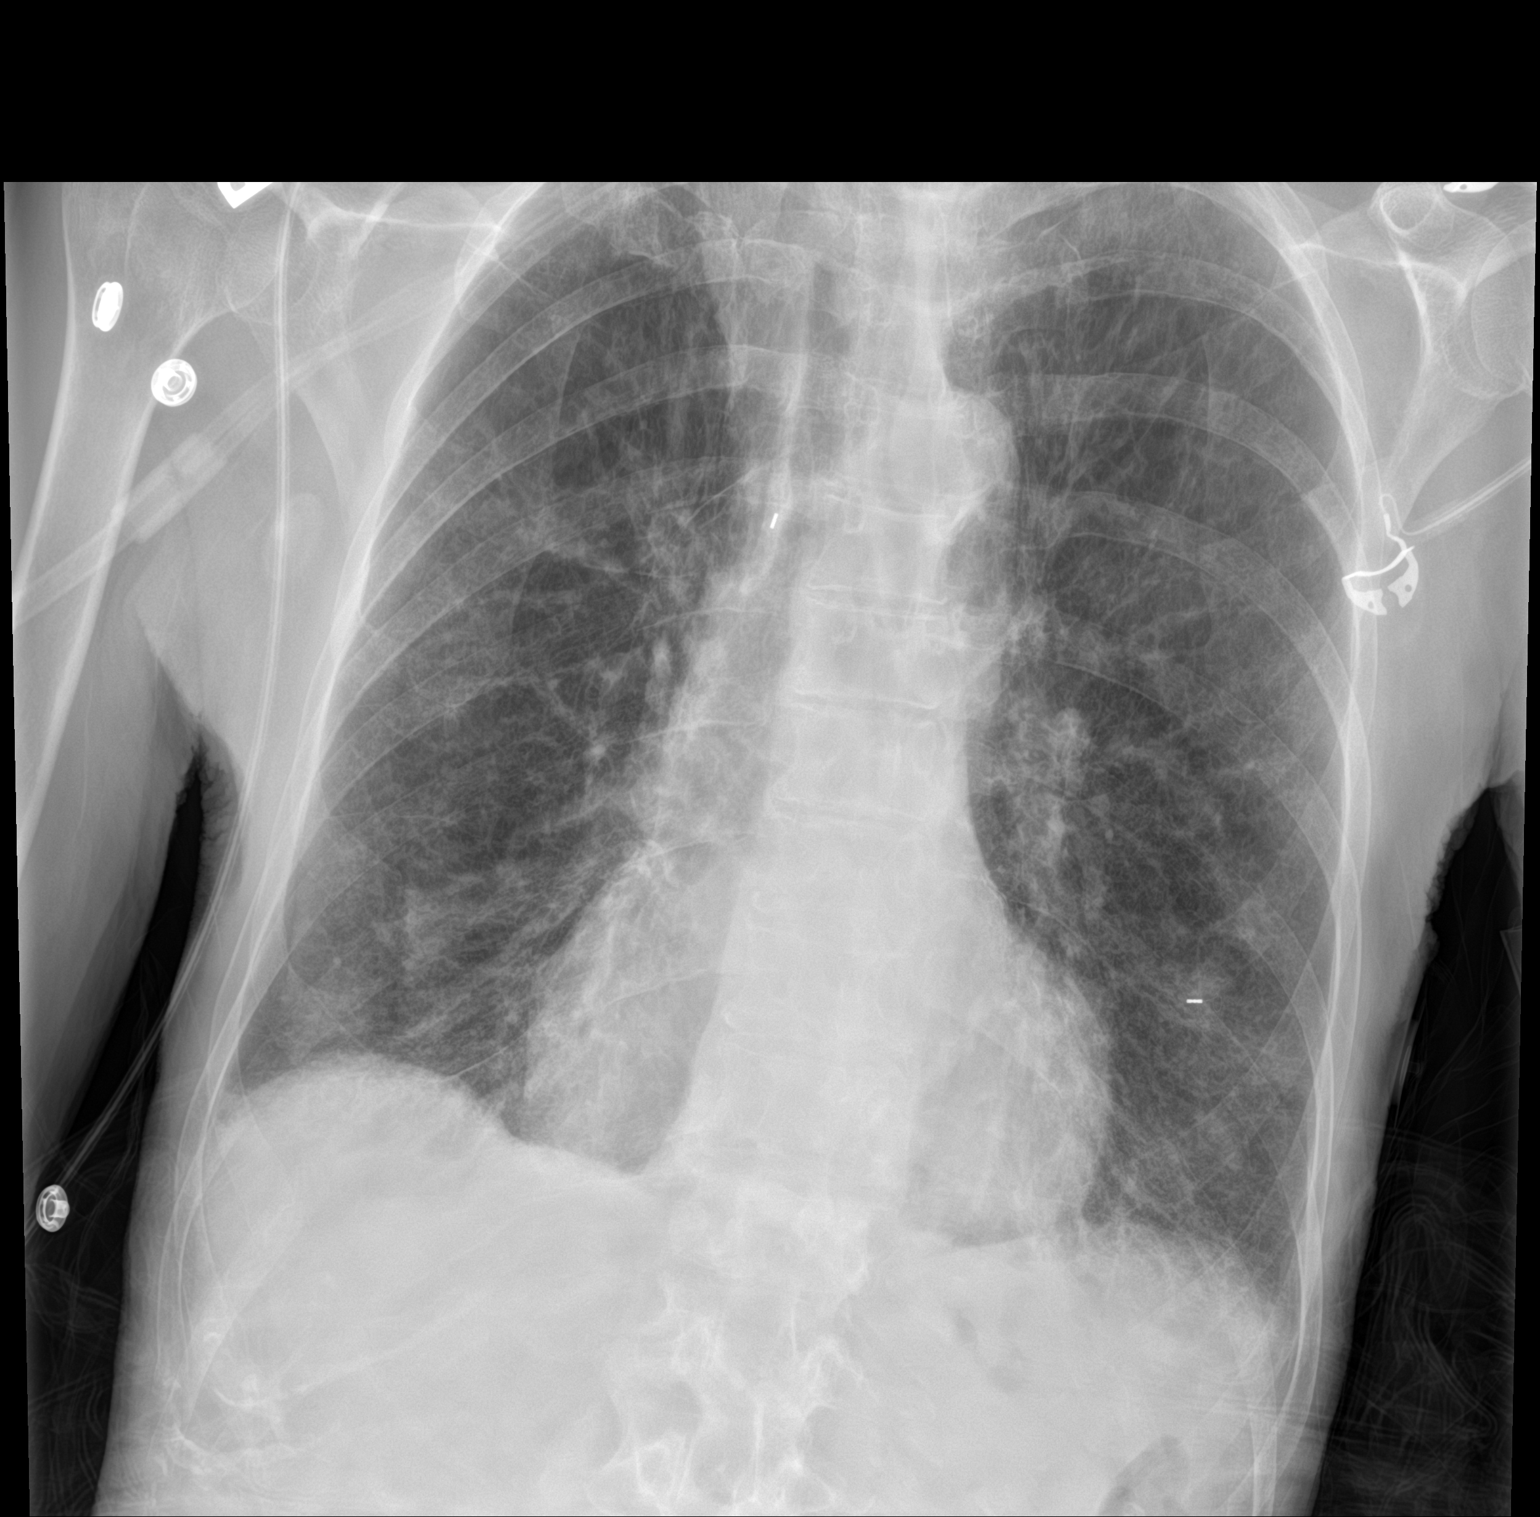

[1 of 1 positions shown; findings below may reference images not displayed]

FINDINGS: The heart size and mediastinal contours are within normal limits.
Hyperexpansion of the lungs is noted. No pneumothorax or pleural
effusion is noted. Bilateral pulmonary nodules are noted. Mild
bibasilar subsegmental atelectasis is noted. The visualized
skeletal structures are unremarkable.
IMPRESSION: No pneumothorax or pleural effusion is noted. Mild bibasilar
subsegmental atelectasis is noted.

## 2022-05-12 ENCOUNTER — Encounter: Payer: Self-pay | Admitting: Radiation Oncology

## 2022-05-14 ENCOUNTER — Ambulatory Visit (HOSPITAL_COMMUNITY)
Admission: RE | Admit: 2022-05-14 | Discharge: 2022-05-14 | Disposition: A | Discharge status: Home / Self Care | Payer: Medicare Other | Source: Ambulatory Visit | Attending: Radiation Oncology | Admitting: Radiation Oncology

## 2022-05-14 ENCOUNTER — Encounter: Payer: Self-pay | Admitting: Pulmonary Disease

## 2022-05-14 ENCOUNTER — Encounter: Payer: Self-pay | Admitting: *Deleted

## 2022-05-14 DIAGNOSIS — C3432 Malignant neoplasm of lower lobe, left bronchus or lung: Secondary | ICD-10-CM | POA: Diagnosis present

## 2022-05-14 LAB — POCT I-STAT CREATININE: Creatinine, Ser: 0.5 mg/dL (ref 0.44–1.00)

## 2022-05-14 MED ORDER — IOHEXOL 300 MG/ML  SOLN
75.0000 mL | Freq: Once | INTRAMUSCULAR | Status: AC | PRN
  Administered 2022-05-14: 75 mL via INTRAVENOUS

## 2022-05-15 NOTE — Telephone Encounter (Signed)
Please set up ov with Dr. Valeta Harms or Rivaldo Hineman NP with [facility] in next few weeks .  Sooner if needed  If she is dropping her O2 sats on O2 , she will need a work in visit this week to eval why she is dropping so low on O2  Please contact office for sooner follow up if symptoms do not improve or worsen or seek emergency care

## 2022-05-19 ENCOUNTER — Ambulatory Visit
Admission: RE | Admit: 2022-05-19 | Discharge: 2022-05-19 | Disposition: A | Discharge status: Home / Self Care | Payer: Medicare Other | Source: Ambulatory Visit | Attending: Radiation Oncology | Admitting: Radiation Oncology

## 2022-05-19 ENCOUNTER — Encounter: Payer: Self-pay | Admitting: Radiation Oncology

## 2022-05-19 DIAGNOSIS — C50519 Malignant neoplasm of lower-outer quadrant of unspecified female breast: Secondary | ICD-10-CM

## 2022-05-19 DIAGNOSIS — C3432 Malignant neoplasm of lower lobe, left bronchus or lung: Secondary | ICD-10-CM

## 2022-05-19 NOTE — Telephone Encounter (Signed)
Called and spoke with patient. She is interested in coming in this week due to her O2 levels. She is unable to do early morning appts. She prefers mid day appts.   TP, would you be ok with [place] using a 1pm slot for her this week? She would be willing to come in on Thursday at 1pm as this is the only day she does not already have another appt.

## 2022-05-19 NOTE — Progress Notes (Signed)
Radiation Oncology         [phone] ________________________________  Initial Outpatient Follow Up - Conducted via telephone at patient request.  I spoke with the patient to conduct this consult visit via telephone. The patient was notified in advance and was offered an in person or telemedicine meeting to allow for face to face communication but instead preferred to proceed with a telephone visit.   Name: Beth Daniels        MRN: [id number]  Date of Service: 05/19/2022 DOB: 06/13/1946  JJ:HERDE, [name], MD  Shon Baton, MD     REFERRING PHYSICIAN: Shon Baton, MD   DIAGNOSIS: The primary encounter diagnosis was Malignant neoplasm of lower-outer quadrant of female breast, unspecified estrogen receptor status, unspecified laterality ([place]). A diagnosis of Malignant neoplasm of lower lobe of left lung (HCC) was also pertinent to this visit.   HISTORY OF PRESENT ILLNESS: Beth Daniels is a 76 y.o. female with a diagnosis of putative stage I lung cancer of the left lower lobe.  She has a history of breast cancer almost 10 years ago treated with bilateral mastectomy.  She has emphysema with continuous oxygen at 2 L due to hypoxemic respiratory failure. Multiple scans have been followed by pulmonary in the last 2 years and 3 nodules have been seen that have grown.  PET in August 2022 did not show hypermetabolic change. She did undergo bronchoscopy in the summer 2022 and all the nodules that were sampled were negative for disease however they have remained suspicious radiographically.  She was not a good candidate for biopsy and went on to receive definitive SBRT to the LLL which she completed in March 2023.  She's had stable post treatment CT imaging. Her surveillance CT scan of the chest on 05/14/22 showed stable post treatment change in the LLL region near the lingula. Stable nodules remain in other lobes. Stigmata of atherosclerotic changes, emphysematous changes of the lungs, and stable nodule in the  right adrenal gland remains consistent with adenoma. She also has what appears to be dilated ventricle and borderline cardiac enlargement in addition to a stable pericardial cystic structure. She's contacted by phone to review these results. Of not she was being worked up for a positive hemocult during our initial consultation. It appears she was set up for colonoscopy in April 2023 but this was cancelled.    PREVIOUS RADIATION THERAPY:   09/10/21-09/17/21 SBRT Treatment The tumor in the LLL was treated with a course of stereotactic body radiation treatment. The patient received 54 Gy In 3 fractions at 18 G per fraction.  PAST MEDICAL HISTORY:  Past Medical History:  Diagnosis Date   Alcohol abuse    Anemia    Cancer ([place])    breast right   Chronic hyponatremia    COPD (chronic obstructive pulmonary disease) (HCC)    DCIS (ductal carcinoma in situ)    Dyspnea    Emphysema of lung (HCC)    Osteoporosis    Overactive bladder    PVD (peripheral vascular disease) ([place])    Tobacco use        PAST SURGICAL HISTORY: Past Surgical History:  Procedure Laterality Date   ABDOMINAL AORTOGRAM W/LOWER EXTREMITY N/A 03/15/2019   Procedure: ABDOMINAL AORTOGRAM W/LOWER EXTREMITY;  Surgeon: Serafina Mitchell, MD;  Location: [facility];  Service: Cardiovascular;  Laterality: N/A;   aeptoplasty     BREAST SURGERY     BRONCHIAL BRUSHINGS  02/12/2021   Procedure: BRONCHIAL BRUSHINGS;  Surgeon: Garner Nash, DO;  Location: [facility];  Service: Pulmonary;;   BRONCHIAL NEEDLE ASPIRATION BIOPSY  02/12/2021   Procedure: BRONCHIAL NEEDLE ASPIRATION BIOPSIES;  Surgeon: Garner Nash, DO;  Location: [facility];  Service: Pulmonary;;   cartaract extraction     EYE SURGERY Bilateral    Cataract in R & L eye and Retina surgery after cataract surgery in L eye   FIDUCIAL MARKER PLACEMENT  02/12/2021   Procedure: FIDUCIAL MARKER PLACEMENT;  Surgeon: Garner Nash, DO;  Location: [facility];   Service: Pulmonary;;   INTRAMEDULLARY (IM) NAIL INTERTROCHANTERIC Left 01/30/2019   Procedure: INTRAMEDULLARY (IM) NAIL INTERTROCHANTRIC;  Surgeon: Frederik Pear, MD;  Location: [facility];  Service: Orthopedics;  Laterality: Left;   IR FIBRIN GLUE REPAIR ANAL FISTULA     MASTECTOMY     s/p bl   PERIPHERAL VASCULAR INTERVENTION  03/15/2019   Procedure: PERIPHERAL VASCULAR INTERVENTION;  Surgeon: Serafina Mitchell, MD;  Location: [facility];  Service: Cardiovascular;;  Lt. SFA   PERIPHERAL VASCULAR THROMBECTOMY  03/15/2019   Procedure: PERIPHERAL VASCULAR THROMBECTOMY;  Surgeon: Serafina Mitchell, MD;  Location: [facility];  Service: Cardiovascular;;  Lt. AT, PT   RETINAL DETACHMENT SURGERY     sclerotherapy vein leg     VASCULAR SURGERY     VIDEO BRONCHOSCOPY WITH ENDOBRONCHIAL NAVIGATION Bilateral 02/12/2021   Procedure: VIDEO BRONCHOSCOPY WITH ENDOBRONCHIAL NAVIGATION;  Surgeon: Garner Nash, DO;  Location: [facility];  Service: Pulmonary;  Laterality: Bilateral;  ION   VIDEO BRONCHOSCOPY WITH RADIAL ENDOBRONCHIAL ULTRASOUND  02/12/2021   Procedure: RADIAL ENDOBRONCHIAL ULTRASOUND;  Surgeon: Garner Nash, DO;  Location: [facility];  Service: Pulmonary;;   vitreous retinal surgery       FAMILY HISTORY:  Family History  Problem Relation Age of Onset   Alzheimer's disease Mother    Pneumonia Father        aspiration pneumonia     SOCIAL HISTORY:  reports that she quit smoking about 5 years ago. Her smoking use included cigarettes. She started smoking about 53 years ago. She has a 75.00 pack-year smoking history. She has never used smokeless tobacco. She reports current alcohol use of about 28.0 standard drinks of alcohol per week. She reports that she does not currently use drugs. The patient is married and lives in [place] and she is a retired from working in Personal assistant.   ALLERGIES: Patient has no known allergies.   MEDICATIONS:  Current Outpatient  Medications  Medication Sig Dispense Refill   albuterol (PROVENTIL) (2.5 MG/3ML) 0.083% nebulizer solution Take 3 mLs (2.5 mg total) by nebulization every 4 (four) hours as needed for wheezing or shortness of breath. 75 mL 3   albuterol (VENTOLIN HFA) 108 (90 Base) MCG/ACT inhaler USE 1 INHALATION EVERY 6 HOURS AS NEEDED FOR WHEEZING OR SHORTNESS OF BREATH 17 g 3   ANORO ELLIPTA 62.5-25 MCG/ACT AEPB USE 1 INHALATION DAILY 180 each 3   aspirin EC 81 MG tablet Take 81 mg by mouth daily.     atorvastatin (LIPITOR) 40 MG tablet      azelastine (ASTELIN) 0.1 % nasal spray USE 1 SPRAY IN EACH NOSTRIL TWICE A DAY 30 mL 6   Azelastine-Fluticasone 137-50 MCG/ACT SUSP Place 1 spray into the nose daily. (Patient taking differently: Place 1 spray into the nose 2 (two) times daily.) 23 g 6   conjugated estrogens (PREMARIN) vaginal cream Place 1 Applicatorful vaginally every Monday, Wednesday, and Friday at  8 PM.     Cranberry 400 MG CAPS Take 1,200 mg by mouth daily at 2 PM.     denosumab (PROLIA) 60 MG/ML SOSY injection Inject 60 mg into the skin every 6 (six) months.      docusate sodium (COLACE) 100 MG capsule Take 1 capsule (100 mg total) by mouth 2 (two) times daily. (Patient taking differently: Take 100 mg by mouth at bedtime.) 10 capsule 0   Ferrous Fumarate (HEMOCYTE - 106 MG FE) 324 (106 Fe) MG TABS tablet Take 1 tablet by mouth daily at 3 pm.      fluticasone (FLONASE) 50 MCG/ACT nasal spray USE 2 SPRAYS IN EACH NOSTRIL TWICE A DAY 48 g 7   folic acid (FOLVITE) 1 MG tablet Take 1 mg by mouth daily.     gabapentin (NEURONTIN) 100 MG capsule Take 2 capsules (200 mg total) by mouth 3 (three) times daily. 540 capsule 3   gabapentin (NEURONTIN) 300 MG capsule Take 2 capsules (600 mg total) by mouth 3 (three) times daily. 540 capsule 3   guaiFENesin (MUCINEX) 600 MG 12 hr tablet Take 600 mg by mouth 2 (two) times daily.     hydrocortisone 2.5 % cream Apply 1 application topically daily.     Multiple  Vitamin (MULTI-VITAMIN) tablet Take 1 tablet by mouth daily.      mupirocin ointment (BACTROBAN) 2 % Apply 1 application topically daily as needed (wound care).      MYRBETRIQ 50 MG TB24 tablet Take 50 mg by mouth at bedtime.     nicotine polacrilex (COMMIT) 4 MG lozenge Take 4 mg by mouth as needed for smoking cessation.      OXYGEN Inhale 2 L into the lungs continuous.     pantoprazole (PROTONIX) 40 MG tablet Take 40 mg by mouth daily.     sodium chloride (OCEAN) 0.65 % SOLN nasal spray Place 1 spray into both nostrils 2 (two) times daily. As needed during the day     Vitamin D, Cholecalciferol, 25 MCG (1000 UT) TABS Take 1,000 Units by mouth daily.     No current facility-administered medications for this encounter.     REVIEW OF SYSTEMS: On review of systems the patient reports that she is doing okay. She called last week wanting a pulmonary assessment from [place]. We encouraged involvement with pulmonary medicine since she's O2 dependent. She is getting scheduled with their office for follow up and has recently needed more than the 3 L her mobile compressor is able to provide, and is using up to 5 liters at home with her main compressor. She reports increasing shortness of breath with light activities and has had increasing frequency of cough, productive mucous and several episodes of hemoptysis. She reports she has not followed back up with Dr. Michail Sermon but thought it was okay that she had a negative CT of the abdomen and pelvis a few weeks ago. She is having abdominal pain in her mid abdomen that is worsened when she bends over. She was concerned about a hernia, and reports she has not seen any blood in her stool. No other complaints are verbalized.       PHYSICAL EXAM:  Unable to assess due to encounter type.    ECOG = 1  0 - Asymptomatic (Fully active, able to carry on all predisease activities without restriction)  1 - Symptomatic but completely ambulatory (Restricted in physically  strenuous activity but ambulatory and able to carry out work of a light or  sedentary nature. For example, light housework, office work)  2 - Symptomatic, <50% in bed during the day (Ambulatory and capable of all self care but unable to carry out any work activities. Up and about more than 50% of waking hours)  3 - Symptomatic, >50% in bed, but not bedbound (Capable of only limited self-care, confined to bed or chair 50% or more of waking hours)  4 - Bedbound (Completely disabled. Cannot carry on any self-care. Totally confined to bed or chair)  5 - Death   Eustace Pen MM, Creech RH, Tormey DC, et al. [phone]). "Toxicity and response criteria of the [facility]". [place] Oncol. 5 (6): 649-55    LABORATORY DATA:  Lab Results  Component Value Date   WBC 3.8 10/01/2021   HGB 11.5 10/01/2021   HCT 32.5 (L) 10/01/2021   MCV 98 (H) 10/01/2021   PLT 201 02/12/2021   Lab Results  Component Value Date   NA 135 10/01/2021   K 4.5 10/01/2021   CL 91 (L) 10/01/2021   CO2 32 (H) 10/01/2021   Lab Results  Component Value Date   ALT 17 10/01/2021   AST 33 10/01/2021   ALKPHOS 70 10/01/2021   BILITOT 0.4 10/01/2021      RADIOGRAPHY: CT CHEST W CONTRAST  Result Date: 05/16/2022 CLINICAL DATA:  Non-small cell lung cancer, status post radiation therapy. * Tracking Code: BO * EXAM: CT CHEST WITH CONTRAST TECHNIQUE: Multidetector CT imaging of the chest was performed during intravenous contrast administration. RADIATION DOSE REDUCTION: This exam was performed according to the departmental dose-optimization program which includes automated exposure control, adjustment of the mA and/or kV according to patient size and/or use of iterative reconstruction technique. CONTRAST:  52mL OMNIPAQUE IOHEXOL 300 MG/ML  SOLN COMPARISON:  11/08/2021 and CT chest 05/06/2019. FINDINGS: Cardiovascular: Atherosclerotic calcification of the aorta, aortic valve and coronary arteries. Heart is at the  upper limits of normal in size to mildly enlarged. Left ventricle appears somewhat dilated. No pericardial effusion. Small cystic structure along the right heart border measures 1.3 cm, stable and likely a small pericardial cyst. Mediastinum/Nodes: No pathologically enlarged mediastinal, hilar or axillary lymph nodes. Esophagus is grossly unremarkable. Lungs/Pleura: Biapical pleuroparenchymal scarring. Centrilobular and paraseptal emphysema. 8 mm nodule in the superior segment right lower lobe (6 x 9 mm, 5/42), stable. 9 mm nodule in the medial aspect of the anterior segment right upper lobe (5/46), also stable. New patchy consolidation and ground-glass in the inferior lingula, obscuring a previously measured 10 mm nodule on 11/08/2021. Spiculated nodule in the anterior left lower lobe measures 9 mm (7 x 11 mm, 5/105), similar to 11/08/2021. 3 mm posterolateral left lower lobe nodule (5/123), stable. No new pulmonary nodules. No pleural fluid. Debris is seen in the airway. Upper Abdomen: Visualized portion of the liver is unremarkable. Hyperdense right adrenal nodule measures 1.9 cm, stable in size from 05/06/2019, at which time it measured 22 Hounsfield units. No specific follow-up necessary. Slight thickening of the left adrenal gland, unchanged. Visualized portions of the kidneys, spleen and pancreas are unremarkable. Gastric wall thickening. No upper abdominal adenopathy. Musculoskeletal: Degenerative changes in the spine. No worrisome lytic or sclerotic lesions. IMPRESSION: 1. New changes of radiation therapy in the inferior lingula, obscuring a previously seen nodule. 2. Additional bilateral pulmonary nodules, including a spiculated left lower lobe nodule, are stable. Recommend continued attention on follow-up as adenocarcinoma cannot be excluded. 3. Hyperdense right adrenal nodule, stable from 05/06/2019, indicative of an adenoma.  4.  Aortic atherosclerosis (ICD10-I70.0). 5.  Emphysema (ICD10-J43.9).  Electronically Signed   By: Lorin Picket M.D.   On: 05/16/2022 14:25       IMPRESSION/PLAN: 1. Putative Stage IA2, cT1bN0M0, NSCLC of the LLL along the level of the lingula. The patient's imaging is reassuring and we will plan to repeat a CT in 6 months time. She is aware to keep [place] informed of changes or concerns prior to that time.  2. RUL and LLL nodules. These will be followed expectantly with subsequent scans but remain stable. 3. Heme positive stool. She will follow back up with Dr. Michail Sermon as her colonoscopy months ago was cancelled and she's having abdominal pain. 4. Increasing shortness of breath in the setting of O2 dependent COPD, and notes on CT for increasing size of the left ventricle. I encouraged her to proceed with work up with pulmonary medicine and if her symptoms acutely worsen to seek urgent medical evaluation. She may need further work up with cardiology as well pending her pulmonary work up.   This encounter was conducted via telephone.  The patient has provided two factor identification and has given verbal consent for this type of encounter and has been advised to only accept a meeting of this type in a secure network environment. The time spent during this encounter was 45 minutes including preparation, discussion, and coordination of the patient's care. The attendants for this meeting include  Hayden Pedro  and Bo Mcclintock, her husband Roniya Tetro, and sister Izora Gala.  During the encounter,   Hayden Pedro was located at [facility].  Bo Mcclintock was located at home with her husband Morine Kohlman, and sister Izora Gala.      Carola Rhine, [facility]   **Disclaimer: This note was dictated with voice recognition software. Similar sounding words can inadvertently be transcribed and this note may contain transcription errors which may not have been corrected upon publication of note.**

## 2022-05-19 NOTE — Telephone Encounter (Signed)
Yes that is fine or can double book

## 2022-05-19 NOTE — Progress Notes (Signed)
Telephone nursing appointment for Malignant neoplasm of lower-outer quadrant of female breast, unspecified estrogen receptor status, unspecified laterality ([place]). A diagnosis of Malignant neoplasm of lower lobe of left lung ([place]). I verified patient's identity and began nursing interview. Patient reports moderate SOB, that worsens w/ exertion and is managed w/ portable oxygen.   Meaningful use complete.   Patient aware of their 2:30pm-05/19/2022 telephone appointment w/ Shona Simpson PA-C to receive most recent CT results. I left my extension [phone] in case patient needs anything. Patient verbalized understanding. This concludes the nursing interview.   Patient contact [phone]     Leandra Kern, LPN

## 2022-05-22 ENCOUNTER — Ambulatory Visit (INDEPENDENT_AMBULATORY_CARE_PROVIDER_SITE_OTHER): Payer: Medicare Other | Admitting: Adult Health

## 2022-05-22 ENCOUNTER — Encounter: Payer: Self-pay | Admitting: Adult Health

## 2022-05-22 VITALS — BP 148/60 | HR 104 | Temp 98.6°F | Ht 62.0 in | Wt 94.6 lb

## 2022-05-22 DIAGNOSIS — R0602 Shortness of breath: Secondary | ICD-10-CM | POA: Diagnosis not present

## 2022-05-22 DIAGNOSIS — J9611 Chronic respiratory failure with hypoxia: Secondary | ICD-10-CM | POA: Diagnosis not present

## 2022-05-22 DIAGNOSIS — C3432 Malignant neoplasm of lower lobe, left bronchus or lung: Secondary | ICD-10-CM

## 2022-05-22 DIAGNOSIS — I779 Disorder of arteries and arterioles, unspecified: Secondary | ICD-10-CM | POA: Diagnosis not present

## 2022-05-22 DIAGNOSIS — R051 Acute cough: Secondary | ICD-10-CM

## 2022-05-22 DIAGNOSIS — J441 Chronic obstructive pulmonary disease with (acute) exacerbation: Secondary | ICD-10-CM | POA: Diagnosis not present

## 2022-05-22 LAB — BASIC METABOLIC PANEL
BUN: 5 mg/dL — ABNORMAL LOW (ref 6–23)
CO2: 39 mEq/L — ABNORMAL HIGH (ref 19–32)
Calcium: 9 mg/dL (ref 8.4–10.5)
Chloride: 92 mEq/L — ABNORMAL LOW (ref 96–112)
Creatinine, Ser: 0.38 mg/dL — ABNORMAL LOW (ref 0.40–1.20)
GFR: 97.77 mL/min (ref >60.00)
Glucose, Bld: 93 mg/dL (ref 70–99)
Potassium: 4.7 mEq/L (ref 3.5–5.1)
Sodium: 136 mEq/L (ref 135–145)

## 2022-05-22 LAB — CBC WITH DIFFERENTIAL/PLATELET
Basophils Absolute: 0 10*3/uL (ref 0.0–0.1)
Basophils Relative: 0.5 % (ref 0.0–3.0)
Eosinophils Absolute: 0.1 10*3/uL (ref 0.0–0.7)
Eosinophils Relative: 1.3 % (ref 0.0–5.0)
HCT: 33.5 % — ABNORMAL LOW (ref 36.0–46.0)
Hemoglobin: 11.2 g/dL — ABNORMAL LOW (ref 12.0–15.0)
Lymphocytes Relative: 11.5 % — ABNORMAL LOW (ref 12.0–46.0)
Lymphs Abs: 0.4 10*3/uL — ABNORMAL LOW (ref 0.7–4.0)
MCHC: 33.3 g/dL (ref 30.0–36.0)
MCV: 103.6 fl — ABNORMAL HIGH (ref 78.0–100.0)
Monocytes Absolute: 0.6 10*3/uL (ref 0.1–1.0)
Monocytes Relative: 14.8 % — ABNORMAL HIGH (ref 3.0–12.0)
Neutro Abs: 2.7 10*3/uL (ref 1.4–7.7)
Neutrophils Relative %: 71.9 % (ref 43.0–77.0)
Platelets: 142 10*3/uL — ABNORMAL LOW (ref 150.0–400.0)
RBC: 3.23 Mil/uL — ABNORMAL LOW (ref 3.87–5.11)
RDW: 14.3 % (ref 11.5–15.5)
WBC: 3.8 10*3/uL — ABNORMAL LOW (ref 4.0–10.5)

## 2022-05-22 LAB — BRAIN NATRIURETIC PEPTIDE: Pro B Natriuretic peptide (BNP): 115 pg/mL — ABNORMAL HIGH (ref 0.0–100.0)

## 2022-05-22 MED ORDER — TRELEGY ELLIPTA 100-62.5-25 MCG/ACT IN AEPB: 1.0000 | INHALATION_SPRAY | Freq: Every day | RESPIRATORY_TRACT | 0 refills | Status: DC

## 2022-05-22 MED ORDER — DOXYCYCLINE HYCLATE 100 MG PO TABS: 100.0000 mg | ORAL_TABLET | Freq: Two times a day (BID) | ORAL | 0 refills | Status: DC

## 2022-05-22 MED ORDER — TRELEGY ELLIPTA 100-62.5-25 MCG/ACT IN AEPB: 1.0000 | INHALATION_SPRAY | Freq: Every day | RESPIRATORY_TRACT | 5 refills | Status: DC

## 2022-05-22 NOTE — Progress Notes (Signed)
Patient seen in the office today and instructed on use of Flutter valve.  Patient expressed understanding and demonstrated technique.

## 2022-05-22 NOTE — Progress Notes (Signed)
@Patient  ID: Beth Daniels, female    DOB: Apr 10, 1946, 76 y.o.   MRN: [id number]  Chief Complaint  Patient presents with   Acute Visit    Referring provider: Shon Baton, MD  HPI: 76 year old female followed for severe emphysema, lung nodules and chronic respiratory failure on oxygen. Presumed Lung cancer with concerning LLL nodule s/p SBRT -09/2021  Medical history significant for breast cancer  TEST/EVENTS :  CT chest May 14, 2022 biapical pleural-parenchymal scarring, emphysema, stable 8 mm right lower lobe nodule, 9 mm right upper lobe nodule stable, new patchy consolidation and groundglass in the inferior lingula obscuring previous 10 mm nodule measured on Nov 08, 2021, similar 9 mm left lower lobe nodule, 3 mm left lower lobe nodule no new nodules.,  Mildly enlarged heart and left ventricle.  No pericardial effusion noted.  05/22/2022 Acute OV : Emphysema , Presumed Lung nodule , O2 RF  Patient presents for a work in  visit.  Complains over the last couple weeks she has had increased shortness of breath, increased dyspnea with activity despite oxygen   Patient has underlying severe emphysema.  Presumed lung cancer with suspicious lingular nodule status post SBRT March 2023, stable pulmonary nodules on serial CT surveillance scans.  She is on chronic oxygen at 5 L at home.  She does have a portable concentrator that delivers pulsed oxygen on 3 L.  Today in the office on arrival O2 saturations were 91% on 3 L pulsed.  Walk test shows shows adequate O2 sats on 3l/m pulsed with sats at  95-99%  Most recent CT scan showed stability and pulmonary nodules.  New patchy consolidation and groundglass in the inferior lingula felt to represent radiation changes. Gets winded with minimal symptoms, decreased activity tolerance.  Increased cough and congestion and sinus drainage. Mucus is yellow . No wheezing .  No increased edema,. Occasional speck of blood in nasal discharge and chest congestion .       No Known Allergies  Immunization History  Administered Date(s) Administered   Fluad Quad(high Dose 65+) 05/15/2020   Influenza, High Dose Seasonal PF 05/04/2013, 05/04/2015, 06/04/2016, 05/15/2017, 04/29/2018, 04/04/2019   Influenza,inj,quad, With Preservative 04/06/2018   Influenza-Unspecified 07/15/2012, 05/04/2013, 05/03/2014, 05/04/2015, 06/04/2016, 05/15/2017   PFIZER(Purple Top)SARS-COV-2 Vaccination 08/12/2019, 09/06/2019, 03/02/2020   Pneumococcal Conjugate-13 06/20/2015   Pneumococcal Polysaccharide-23 08/14/2011   Zoster Recombinat (Shingrix) 04/30/2018, 07/31/2018   Zoster, Live 12/07/2012    Past Medical History:  Diagnosis Date   Alcohol abuse    Anemia    Cancer ([place])    breast right   Chronic hyponatremia    COPD (chronic obstructive pulmonary disease) ([facility])    DCIS (ductal carcinoma in situ)    Dyspnea    Emphysema of lung ([facility])    Osteoporosis    Overactive bladder    PVD (peripheral vascular disease) ([place])    Tobacco use     Tobacco History: Social History   Tobacco Use  Smoking Status Former   Packs/day: 1.50   Years: 50.00   Total pack years: 75.00   Types: Cigarettes   Start date: 59   Quit date: 10/06/2016   Years since quitting: 5.6  Smokeless Tobacco Never   Counseling given: Not Answered   Outpatient Medications Prior to Visit  Medication Sig Dispense Refill   albuterol (PROVENTIL) (2.5 MG/3ML) 0.083% nebulizer solution Take 3 mLs (2.5 mg total) by nebulization every 4 (four) hours as needed for wheezing or shortness of breath. 75 mL 3  albuterol (VENTOLIN HFA) 108 (90 Base) MCG/ACT inhaler USE 1 INHALATION EVERY 6 HOURS AS NEEDED FOR WHEEZING OR SHORTNESS OF BREATH 17 g 3   ANORO ELLIPTA 62.5-25 MCG/ACT AEPB USE 1 INHALATION DAILY 180 each 3   aspirin EC 81 MG tablet Take 81 mg by mouth daily.     atorvastatin (LIPITOR) 40 MG tablet      azelastine (ASTELIN) 0.1 % nasal spray USE 1 SPRAY IN EACH NOSTRIL TWICE A DAY 30 mL 6    Azelastine-Fluticasone 137-50 MCG/ACT SUSP Place 1 spray into the nose daily. (Patient taking differently: Place 1 spray into the nose 2 (two) times daily.) 23 g 6   conjugated estrogens (PREMARIN) vaginal cream Place 1 Applicatorful vaginally every Monday, Wednesday, and Friday at 8 PM.     Cranberry 400 MG CAPS Take 1,200 mg by mouth daily at 2 PM.     denosumab (PROLIA) 60 MG/ML SOSY injection Inject 60 mg into the skin every 6 (six) months.      docusate sodium (COLACE) 100 MG capsule Take 1 capsule (100 mg total) by mouth 2 (two) times daily. (Patient taking differently: Take 100 mg by mouth at bedtime.) 10 capsule 0   Ferrous Fumarate (HEMOCYTE - 106 MG FE) 324 (106 Fe) MG TABS tablet Take 1 tablet by mouth daily at 3 pm.      fluticasone (FLONASE) 50 MCG/ACT nasal spray USE 2 SPRAYS IN EACH NOSTRIL TWICE A DAY 48 g 7   folic acid (FOLVITE) 1 MG tablet Take 1 mg by mouth daily.     gabapentin (NEURONTIN) 100 MG capsule Take 2 capsules (200 mg total) by mouth 3 (three) times daily. 540 capsule 3   gabapentin (NEURONTIN) 300 MG capsule Take 2 capsules (600 mg total) by mouth 3 (three) times daily. 540 capsule 3   guaiFENesin (MUCINEX) 600 MG 12 hr tablet Take 600 mg by mouth 2 (two) times daily.     hydrocortisone 2.5 % cream Apply 1 application topically daily.     Multiple Vitamin (MULTI-VITAMIN) tablet Take 1 tablet by mouth daily.      mupirocin ointment (BACTROBAN) 2 % Apply 1 application topically daily as needed (wound care).      MYRBETRIQ 50 MG TB24 tablet Take 50 mg by mouth at bedtime.     nicotine polacrilex (COMMIT) 4 MG lozenge Take 4 mg by mouth as needed for smoking cessation.      OXYGEN Inhale 2 L into the lungs continuous.     pantoprazole (PROTONIX) 40 MG tablet Take 40 mg by mouth daily.     sodium chloride (OCEAN) 0.65 % SOLN nasal spray Place 1 spray into both nostrils 2 (two) times daily. As needed during the day     Vitamin D, Cholecalciferol, 25 MCG (1000 UT) TABS  Take 1,000 Units by mouth daily.     No facility-administered medications prior to visit.     Review of Systems:   Constitutional:   No  weight loss, night sweats,  Fevers, chills, fatigue, or  lassitude.  HEENT:   No headaches,  Difficulty swallowing,  Tooth/dental problems, or  Sore throat,                No sneezing, itching, ear ache, nasal congestion, post nasal drip,   CV:  No chest pain,  Orthopnea, PND, swelling in lower extremities, anasarca, dizziness, palpitations, syncope.   GI  No heartburn, indigestion, abdominal pain, nausea, vomiting, diarrhea, change in bowel habits, loss of  appetite, bloody stools.   Resp: No shortness of breath with exertion or at rest.  No excess mucus, no productive cough,  No non-productive cough,  No coughing up of blood.  No change in color of mucus.  No wheezing.  No chest wall deformity  Skin: no rash or lesions.  GU: no dysuria, change in color of urine, no urgency or frequency.  No flank pain, no hematuria   MS:  No joint pain or swelling.  No decreased range of motion.  No back pain.    Physical Exam    GEN: A/Ox3; pleasant , NAD, well nourished    HEENT:  [place]/AT,  EACs-clear, TMs-wnl, NOSE-clear, THROAT-clear, no lesions, no postnasal drip or exudate noted.   NECK:  Supple w/ fair ROM; no JVD; normal carotid impulses w/o bruits; no thyromegaly or nodules palpated; no lymphadenopathy.    RESP  Clear  P & A; w/o, wheezes/ rales/ or rhonchi. no accessory muscle use, no dullness to percussion  CARD:  RRR, no m/r/g, no peripheral edema, pulses intact, no cyanosis or clubbing.  GI:   Soft & nt; nml bowel sounds; no organomegaly or masses detected.   Musco: Warm bil, no deformities or joint swelling noted.   Neuro: alert, no focal deficits noted.    Skin: Warm, no lesions or rashes    Lab Results:  CBC   BMET   BNP   ProBNP No results found for: "PROBNP"  Imaging: CT CHEST W CONTRAST  Result Date:  05/16/2022 CLINICAL DATA:  Non-small cell lung cancer, status post radiation therapy. * Tracking Code: Beth * EXAM: CT CHEST WITH CONTRAST TECHNIQUE: Multidetector CT imaging of the chest was performed during intravenous contrast administration. RADIATION DOSE REDUCTION: This exam was performed according to the departmental dose-optimization program which includes automated exposure control, adjustment of the mA and/or kV according to patient size and/or use of iterative reconstruction technique. CONTRAST:  52mL OMNIPAQUE IOHEXOL 300 MG/ML  SOLN COMPARISON:  11/08/2021 and CT chest 05/06/2019. FINDINGS: Cardiovascular: Atherosclerotic calcification of the aorta, aortic valve and coronary arteries. Heart is at the upper limits of normal in size to mildly enlarged. Left ventricle appears somewhat dilated. No pericardial effusion. Small cystic structure along the right heart border measures 1.3 cm, stable and likely a small pericardial cyst. Mediastinum/Nodes: No pathologically enlarged mediastinal, hilar or axillary lymph nodes. Esophagus is grossly unremarkable. Lungs/Pleura: Biapical pleuroparenchymal scarring. Centrilobular and paraseptal emphysema. 8 mm nodule in the superior segment right lower lobe (6 x 9 mm, 5/42), stable. 9 mm nodule in the medial aspect of the anterior segment right upper lobe (5/46), also stable. New patchy consolidation and ground-glass in the inferior lingula, obscuring a previously measured 10 mm nodule on 11/08/2021. Spiculated nodule in the anterior left lower lobe measures 9 mm (7 x 11 mm, 5/105), similar to 11/08/2021. 3 mm posterolateral left lower lobe nodule (5/123), stable. No new pulmonary nodules. No pleural fluid. Debris is seen in the airway. Upper Abdomen: Visualized portion of the liver is unremarkable. Hyperdense right adrenal nodule measures 1.9 cm, stable in size from 05/06/2019, at which time it measured 22 Hounsfield units. No specific follow-up necessary. Slight  thickening of the left adrenal gland, unchanged. Visualized portions of the kidneys, spleen and pancreas are unremarkable. Gastric wall thickening. No upper abdominal adenopathy. Musculoskeletal: Degenerative changes in the spine. No worrisome lytic or sclerotic lesions. IMPRESSION: 1. New changes of radiation therapy in the inferior lingula, obscuring a previously seen nodule. 2. Additional bilateral pulmonary  nodules, including a spiculated left lower lobe nodule, are stable. Recommend continued attention on follow-up as adenocarcinoma cannot be excluded. 3. Hyperdense right adrenal nodule, stable from 05/06/2019, indicative of an adenoma. 4.  Aortic atherosclerosis (ICD10-I70.0). 5.  Emphysema (ICD10-J43.9). Electronically Signed   By: Lorin Picket M.D.   On: 05/16/2022 14:25          No data to display          No results found for: "NITRICOXIDE"      Assessment & Plan:   No problem-specific Assessment & Plan notes found for this encounter.     Rexene Edison, NP 05/22/2022

## 2022-05-22 NOTE — Patient Instructions (Addendum)
Doxcycline 100mg .Twice daily  for 1 week, take with food.  Mucinex DM Twice daily  As needed  congestion.  Use Flutter valve Three times a day   Albuterol inhaler or neb As needed   Continue on Oxygen 3l/m during daytime and 5l/m At bedtime   High protein diet  Set up 2D echo  Labs today .  Stop ANORO  Begin TRELEGY 1 puff daily, rinse after use.  Activity as tolerated  Follow up with Dr. [name] or Anjolina Byrer NP in 4-6 week with PFT and As needed   Please contact office for sooner follow up if symptoms do not improve or worsen or seek emergency care

## 2022-05-23 DIAGNOSIS — J9611 Chronic respiratory failure with hypoxia: Secondary | ICD-10-CM | POA: Insufficient documentation

## 2022-05-23 NOTE — Assessment & Plan Note (Addendum)
COPD exacerbation.-Treat with empiric antibiotics.  Increase mucociliary clearance. Change Anoro to Trelegy Check 2D echo and lab work  Plan  Patient Instructions  Doxcycline 100mg .Twice daily  for 1 week, take with food.  Mucinex DM Twice daily  As needed  congestion.  Use Flutter valve Three times a day   Albuterol inhaler or neb As needed   Continue on Oxygen 3l/m during daytime and 5l/m At bedtime   High protein diet  Set up 2D echo  Labs today .  Stop ANORO  Begin TRELEGY 1 puff daily, rinse after use.  Activity as tolerated  Follow up with Dr. [name] or Carmin Dibartolo NP in 4-6 week with PFT and As needed   Please contact office for sooner follow up if symptoms do not improve or worsen or seek emergency care

## 2022-05-23 NOTE — Assessment & Plan Note (Signed)
Continue on oxygen to maintain O2 saturations greater than 88 to 90%. 

## 2022-05-23 NOTE — Assessment & Plan Note (Signed)
Suspicious lingular nodule status post SBRT March 2023 with stable pulmonary nodules on serial CT surveillance scans.  New area of patchy groundglass and consolidation in the lingula felt to be radiation changes.

## 2022-05-26 ENCOUNTER — Other Ambulatory Visit: Payer: Self-pay | Admitting: Pulmonary Disease

## 2022-06-12 ENCOUNTER — Ambulatory Visit (INDEPENDENT_AMBULATORY_CARE_PROVIDER_SITE_OTHER): Payer: Medicare Other

## 2022-06-12 DIAGNOSIS — R0602 Shortness of breath: Secondary | ICD-10-CM | POA: Diagnosis not present

## 2022-06-12 LAB — ECHOCARDIOGRAM COMPLETE
Area-P 1/2: 5.75 cm2
MV M vel: 6.21 m/s
MV Peak grad: 154.3 mmHg
S' Lateral: 3.26 cm

## 2022-06-17 ENCOUNTER — Other Ambulatory Visit: Payer: Self-pay | Admitting: *Deleted

## 2022-06-17 MED ORDER — TRELEGY ELLIPTA 100-62.5-25 MCG/ACT IN AEPB: 1.0000 | INHALATION_SPRAY | Freq: Every day | RESPIRATORY_TRACT | 3 refills | Status: DC

## 2022-06-17 NOTE — Progress Notes (Signed)
Called and spoke with patient, advised of results/recommendations per Rexene Edison NP.  She verbalized understanding.  She states that her sats drop on the POC as it only goes to 3L.  She has the home concentrator which is a continuous flow, however, does not want to use it as it is in her bedroom and the tubing causes a fall hazard.  Advised that she is not tolerating the pulsed oxygen and needs continuous oxygen.  She stated she know that, however, she does not want tanks.  She will follow up with [name] after her PFT on 07/17/2022.  Nothing further needed.

## 2022-06-20 ENCOUNTER — Telehealth: Payer: Self-pay | Admitting: Adult Health

## 2022-06-20 MED ORDER — AZITHROMYCIN 250 MG PO TABS: 250.0000 mg | ORAL_TABLET | ORAL | 0 refills | Status: DC

## 2022-06-20 MED ORDER — PREDNISONE 10 MG PO TABS: ORAL_TABLET | ORAL | 0 refills | Status: DC

## 2022-06-20 NOTE — Telephone Encounter (Signed)
Spoke with the pt and notified of response per [name]  Pt verbalized understanding  Meds sent to pharm and appt with Beth for Monday at 11:30

## 2022-06-20 NOTE — Telephone Encounter (Signed)
Called and spoke with patient.  Patient stated for the past week she is having increased sob, fatigue, and cough with yellow sputum.  Patient stated her O2 sats with exertion has dropped into the 60's, but go back up with rest.  Patient stated she is using 3L POC and 5L O2 continuous with exertion.  Advised patient with sats dropping into the 60's she should be seen and advised ED.  Patient declines ED or Urgent Care.  Patient stated she was told of an emergency medication for patients with COPD flare ups.  Patient is unsure of medication.  Patient stated she is using albuterol emergency inhaler every 6 hours and nebs.     Message routed to [facility], NP to advise

## 2022-06-20 NOTE — Telephone Encounter (Signed)
Highly recommend emergency room with those type of oxygen levels.  She needs to stay on continuous flow oxygen only do not use pulsed oxygen.  Adjust oxygen 5 to 6 L to keep sats above 88 to 90% if she is unable to do that she needs to seek emergency room care call 911 as oxygen levels in the 60s can be very dangerous.  Other things besides COPD exacerbations can cause those type of low oxygen levels such as blood clots, serious infections pneumonia etc. needs further evaluation to sort out.  With increased cough congestion shortness of breath and low oxygen levels.  If she totally refuses to go to the emergency room she can call in  Z-Pak No. 1 take as directed Prednisone taper prednisone 10 mg 4 tabs for 2 days, then 3 tabs for 2 days, 2 tabs for 2 days, then 1 tab for 2 days, then stop #20 , no refills  Needs office visit on Monday for evaluation  Please contact office for sooner follow up if symptoms do not improve or worsen or seek emergency care

## 2022-06-23 ENCOUNTER — Ambulatory Visit (INDEPENDENT_AMBULATORY_CARE_PROVIDER_SITE_OTHER): Payer: Medicare Other | Admitting: Primary Care

## 2022-06-23 ENCOUNTER — Encounter: Payer: Self-pay | Admitting: Primary Care

## 2022-06-23 ENCOUNTER — Ambulatory Visit (INDEPENDENT_AMBULATORY_CARE_PROVIDER_SITE_OTHER): Payer: Medicare Other

## 2022-06-23 VITALS — BP 136/70 | HR 104 | Temp 98.7°F | Ht 62.0 in | Wt 92.0 lb

## 2022-06-23 DIAGNOSIS — J9611 Chronic respiratory failure with hypoxia: Secondary | ICD-10-CM | POA: Diagnosis not present

## 2022-06-23 DIAGNOSIS — J441 Chronic obstructive pulmonary disease with (acute) exacerbation: Secondary | ICD-10-CM | POA: Diagnosis not present

## 2022-06-23 NOTE — Assessment & Plan Note (Signed)
-   Patient noticed O2 was dropping 60% 3L POC with exertion - Oxygen 96% 3L POC today in office - Continue 5L continuous oxygen  - Placing an order for patient to be provided POC that can go up to 5L pulsed  - Advised patient use IS 10/hour while awake

## 2022-06-23 NOTE — Patient Instructions (Addendum)
Recommendations: - Continue Azithromycin and prednisone until complete - Continue Trelegy 1 puff daily in the morning - Continue mucinex 600mg  twice a day - Use Albuterol nebulizer twice a day for the next 3-5 day until symptoms are better - Have [employer] fax [place] order for larger POC   Orders: - CXR today - [employer] order for larger POC - 5L  Follow-up: - Keep apt in January with TP and pulmonary function testing

## 2022-06-23 NOTE — Assessment & Plan Note (Signed)
-   Patient develop acute symptoms of cough and dyspnea last week. Currently on azithromycin and prednisone taper. Symptoms are improving. Checking CXR. Advised she continue Trelegy 123mcg one puff daily, mucinex 600mg  twice a day and Albuterol nebulizer twice a day for the next 3-5 day until symptoms are better. She is scheduled for pulmonary function testing in January.

## 2022-06-23 NOTE — Progress Notes (Signed)
@Patient  ID: Bo Mcclintock, female    DOB: 03-06-1946, 76 y.o.   MRN: [id number]  Chief Complaint  Patient presents with   Acute Visit    Oxygen sats dropping to 68% when moving around/exertion.    Referring provider: Shon Baton, MD  HPI: 76 year old female.  Patient of Dr. Valeta Harms, followed by our office for history of severe emphysema, lung nodules and chronic respiratory failure on oxygen.  Presumed lung cancer with concerning left lower lobe nodule status post SBRT March 2023.   Previous [facility] pulmonary encounter: 05/22/2022 Acute OV : Emphysema , Presumed Lung nodule , O2 RF  Patient presents for a work in  visit.  Complains over the last couple weeks she has had increased shortness of breath, increased dyspnea with activity despite oxygen   Patient has underlying severe emphysema.  Presumed lung cancer with suspicious lingular nodule status post SBRT March 2023, stable pulmonary nodules on serial CT surveillance scans.  She is on chronic oxygen at 5 L at home.  She does have a portable concentrator that delivers pulsed oxygen on 3 L.  Today in the office on arrival O2 saturations were 91% on 3 L pulsed.  Walk test shows shows adequate O2 sats on 3l/m pulsed with sats at  95-99%  Most recent CT scan showed stability and pulmonary nodules.  New patchy consolidation and groundglass in the inferior lingula felt to represent radiation changes. Gets winded with minimal symptoms, decreased activity tolerance.  Increased cough and congestion and sinus drainage. Mucus is yellow . No wheezing .  No increased edema,. Occasional speck of blood in nasal discharge and chest congestion .     06/23/2022 Patient presents today for acute overview.  She called our office on 06/20/2022 reports of increased shortness of breath, fatigue and productive cough with yellow mucus.  Reported O2 saturations with exertion dropping into the 60s while doing activities with POC. She is on 3L POC and 5 L continuous oxygen with  exertion at home.  Patient declined emergency room evaluation.  She was started on azithromycin and prednisone for acute symptoms.  Her husband felt she had beginnings of a COPD exacerbation on 12/11, he noticed she was coughing up more mucus. She has chronic cough/chest congestion but has been getting up yellow mucus last several days. Last week she was unable to do her normal ADLs. Sh experiences moderate shortness of breath with activity. She has seen imporvement since being on zpack and prednisone. She received Trelegy in the mail the same day that she was started on abx/prednisone. She was previously on Anoro. She is seeing some improvement since being on abx. Speaking with [facility] about getting a larger POC that can go up to 5L. She is scheduled for pulmonary function testing in January.    No Known Allergies  Immunization History  Administered Date(s) Administered   Fluad Quad(high Dose 65+) 05/15/2020, 04/21/2022   Influenza, High Dose Seasonal PF 05/04/2013, 05/04/2015, 06/04/2016, 05/15/2017, 04/29/2018, 04/04/2019   Influenza,inj,quad, With Preservative 04/06/2018   Influenza-Unspecified 07/15/2012, 05/04/2013, 05/03/2014, 05/04/2015, 06/04/2016, 05/15/2017   PFIZER(Purple Top)SARS-COV-2 Vaccination 08/12/2019, 09/06/2019, 03/02/2020   Pneumococcal Conjugate-13 06/20/2015   Pneumococcal Polysaccharide-23 08/14/2011   Zoster Recombinat (Shingrix) 04/30/2018, 07/31/2018   Zoster, Live 12/07/2012    Past Medical History:  Diagnosis Date   Alcohol abuse    Anemia    Cancer ([place])    breast right   Chronic hyponatremia    COPD (chronic obstructive pulmonary disease) ([place])    DCIS (  ductal carcinoma in situ)    Dyspnea    Emphysema of lung (HCC)    Osteoporosis    Overactive bladder    PVD (peripheral vascular disease) (HCC)    Tobacco use     Tobacco History: Social History   Tobacco Use  Smoking Status Former   Packs/day: 1.50   Years: 50.00   Total pack years: 75.00    Types: Cigarettes   Start date: 41   Quit date: 10/06/2016   Years since quitting: 5.7  Smokeless Tobacco Never   Counseling given: Not Answered   Outpatient Medications Prior to Visit  Medication Sig Dispense Refill   albuterol (PROVENTIL) (2.5 MG/3ML) 0.083% nebulizer solution Take 3 mLs (2.5 mg total) by nebulization every 4 (four) hours as needed for wheezing or shortness of breath. 75 mL 3   albuterol (VENTOLIN HFA) 108 (90 Base) MCG/ACT inhaler USE 1 INHALATION EVERY 6 HOURS AS NEEDED FOR WHEEZING OR SHORTNESS OF BREATH 17 g 3   aspirin EC 81 MG tablet Take 81 mg by mouth daily.     atorvastatin (LIPITOR) 40 MG tablet      azelastine (ASTELIN) 0.1 % nasal spray USE 1 SPRAY IN EACH NOSTRIL TWICE A DAY 30 mL 6   azithromycin (ZITHROMAX) 250 MG tablet Take 1 tablet (250 mg total) by mouth as directed. 6 tablet 0   conjugated estrogens (PREMARIN) vaginal cream Place 1 Applicatorful vaginally every Monday, Wednesday, and Friday at 8 PM.     Cranberry 400 MG CAPS Take 1,200 mg by mouth daily at 2 PM.     denosumab (PROLIA) 60 MG/ML SOSY injection Inject 60 mg into the skin every 6 (six) months.      docusate sodium (COLACE) 100 MG capsule Take 1 capsule (100 mg total) by mouth 2 (two) times daily. (Patient taking differently: Take 100 mg by mouth at bedtime.) 10 capsule 0   Ferrous Fumarate (HEMOCYTE - 106 MG FE) 324 (106 Fe) MG TABS tablet Take 1 tablet by mouth daily at 3 pm.      fluticasone (FLONASE) 50 MCG/ACT nasal spray USE 2 SPRAYS IN EACH NOSTRIL TWICE A DAY 48 g 7   Fluticasone-Umeclidin-Vilant (TRELEGY ELLIPTA) 100-62.5-25 MCG/ACT AEPB Inhale 1 puff into the lungs daily at 6 (six) AM. 993 each 3   folic acid (FOLVITE) 1 MG tablet Take 1 mg by mouth daily.     gabapentin (NEURONTIN) 100 MG capsule Take 2 capsules (200 mg total) by mouth 3 (three) times daily. 540 capsule 3   gabapentin (NEURONTIN) 300 MG capsule Take 2 capsules (600 mg total) by mouth 3 (three) times daily. 540  capsule 3   guaiFENesin (MUCINEX) 600 MG 12 hr tablet Take 600 mg by mouth 2 (two) times daily.     hydrocortisone 2.5 % cream Apply 1 application topically daily.     Multiple Vitamin (MULTI-VITAMIN) tablet Take 1 tablet by mouth daily.      mupirocin ointment (BACTROBAN) 2 % Apply 1 application topically daily as needed (wound care).      MYRBETRIQ 50 MG TB24 tablet Take 50 mg by mouth at bedtime.     nicotine polacrilex (COMMIT) 4 MG lozenge Take 4 mg by mouth as needed for smoking cessation.      OXYGEN Inhale 2 L into the lungs continuous.     pantoprazole (PROTONIX) 40 MG tablet Take 40 mg by mouth daily.     predniSONE (DELTASONE) 10 MG tablet 4 x 2 days, 3 x  2 days, 2 x 2 days, 1 x 2 days 20 tablet 0   sodium chloride (OCEAN) 0.65 % SOLN nasal spray Place 1 spray into both nostrils 2 (two) times daily. As needed during the day     Vitamin D, Cholecalciferol, 25 MCG (1000 UT) TABS Take 1,000 Units by mouth daily.     Azelastine-Fluticasone 137-50 MCG/ACT SUSP Place 1 spray into the nose daily. (Patient not taking: Reported on 06/23/2022) 23 g 6   doxycycline (VIBRA-TABS) 100 MG tablet Take 1 tablet (100 mg total) by mouth 2 (two) times daily. 14 tablet 0   No facility-administered medications prior to visit.   Review of Systems  Review of Systems  Constitutional: Negative.   HENT: Negative.    Respiratory:  Positive for cough and shortness of breath. Negative for wheezing.    Physical Exam  BP 136/70 (BP Location: Left Arm, Patient Position: Sitting, Cuff Size: Normal)   Pulse (!) 104   Temp 98.7 F (37.1 C) (Oral)   Ht 5\' 2"  (1.575 m)   Wt 92 lb (41.7 kg)   SpO2 96%   BMI 16.83 kg/m  Physical Exam Constitutional:      Appearance: Normal appearance.  HENT:     Head: Normocephalic and atraumatic.     Right Ear: Tympanic membrane normal.     Left Ear: Tympanic membrane normal.     Mouth/Throat:     Mouth: Mucous membranes are moist.     Pharynx: Oropharynx is clear.   Cardiovascular:     Rate and Rhythm: Regular rhythm. Tachycardia present.  Pulmonary:     Effort: Pulmonary effort is normal.     Breath sounds: Normal breath sounds. No wheezing, rhonchi or rales.     Comments: LS clear- diminished  Musculoskeletal:     Cervical back: Normal range of motion and neck supple.  Skin:    General: Skin is warm and dry.  Neurological:     General: No focal deficit present.     Mental Status: She is alert and oriented to person, place, and time. Mental status is at baseline.  Psychiatric:        Mood and Affect: Mood normal.        Behavior: Behavior normal.        Thought Content: Thought content normal.        Judgment: Judgment normal.      Lab Results:  CBC    Component Value Date/Time   WBC 3.8 (L) 05/22/2022 1413   RBC 3.23 (L) 05/22/2022 1413   HGB 11.2 (L) 05/22/2022 1413   HGB 11.5 10/01/2021 1542   HCT 33.5 (L) 05/22/2022 1413   HCT 32.5 (L) 10/01/2021 1542   PLT 142.0 (L) 05/22/2022 1413   MCV 103.6 (H) 05/22/2022 1413   MCV 98 (H) 10/01/2021 1542   MCH 34.6 (H) 10/01/2021 1542   MCH 34.0 02/12/2021 0628   MCHC 33.3 05/22/2022 1413   RDW 14.3 05/22/2022 1413   RDW 11.8 10/01/2021 1542   LYMPHSABS 0.4 (L) 05/22/2022 1413   LYMPHSABS 0.5 (L) 10/01/2021 1542   MONOABS 0.6 05/22/2022 1413   EOSABS 0.1 05/22/2022 1413   EOSABS 0.0 10/01/2021 1542   BASOSABS 0.0 05/22/2022 1413   BASOSABS 0.0 10/01/2021 1542    BMET    Component Value Date/Time   NA 136 05/22/2022 1413   NA 135 10/01/2021 1542   K 4.7 05/22/2022 1413   CL 92 (L) 05/22/2022 1413   CO2 39 (H)  05/22/2022 1413   GLUCOSE 93 05/22/2022 1413   BUN 5 (L) 05/22/2022 1413   BUN 4 (L) 10/01/2021 1542   CREATININE 0.38 (L) 05/22/2022 1413   CALCIUM 9.0 05/22/2022 1413   GFRNONAA >60 02/12/2021 0628   GFRAA >60 02/02/2019 0401    BNP    Component Value Date/Time   BNP 284.8 (H) 02/01/2019 0349    ProBNP    Component Value Date/Time   PROBNP 115.0 (H)  05/22/2022 1413    Imaging: DG Chest 2 View  Result Date: 06/23/2022 CLINICAL DATA:  COPD exacerbation. EXAM: CHEST - 2 VIEW COMPARISON:  02/12/2021 as well as CT 05/14/2022 FINDINGS: Lungs are adequately inflated with opacification over the upper lobes bilaterally as well as the lingula likely infection. Mild blunting of the posterior costophrenic angles likely small amount of bilateral pleural fluid. Evidence of emphysematous disease. Cardiomediastinal silhouette and remainder of the exam is unchanged. Moderate compression fracture over the lower thoracic spine unchanged. IMPRESSION: 1. Opacification over the upper lobes bilaterally as well as the lingula likely infection. Small amount of bilateral pleural fluid. 2. Emphysematous disease. Electronically Signed   By: Marin Olp M.D.   On: 06/23/2022 12:33   ECHOCARDIOGRAM COMPLETE  Result Date: 06/12/2022    ECHOCARDIOGRAM REPORT   Patient Name:   Beth Daniels  Date of Exam: 06/12/2022 Medical Rec #:  [id number]  Height:       62.0 in Accession #:    [id number] Weight:       94.6 lb Date of Birth:  February 23, 1946   BSA:          1.391 m Patient Age:    34 years   BP:           148/60 mmHg Patient Gender: F          HR:           96 bpm. Exam Location:  Outpatient Procedure: 2D Echo, 3D Echo, Cardiac Doppler, Color Doppler and Strain Analysis Indications:    R06.9 DOE  History:        Patient has no prior history of Echocardiogram examinations.                 COPD and PAD; Risk Factors:Former Smoker. History of breast                 cancer; ETOH.  Sonographer:    Leavy Cella RDCS Referring Phys: [place]  1. Left ventricular ejection fraction, by estimation, is 60 to 65%. Left ventricular ejection fraction by 3D volume is 62 %. The left ventricle has normal function. The left ventricle has no regional wall motion abnormalities. Left ventricular diastolic  parameters are consistent with Grade I diastolic dysfunction (impaired  relaxation).  2. Right ventricular systolic function is normal. The right ventricular size is normal. There is moderately elevated pulmonary artery systolic pressure. The estimated right ventricular systolic pressure is 76.1 mmHg.  3. The mitral valve is abnormal. Trivial mitral valve regurgitation.  4. The aortic valve is tricuspid. Aortic valve regurgitation is not visualized.  5. The inferior vena cava is dilated in size with <50% respiratory variability, suggesting right atrial pressure of 15 mmHg. Comparison(s): No prior Echocardiogram. FINDINGS  Left Ventricle: Left ventricular ejection fraction, by estimation, is 60 to 65%. Left ventricular ejection fraction by 3D volume is 62 %. The left ventricle has normal function. The left ventricle has no regional wall motion abnormalities. The left ventricular internal  cavity size was normal in size. There is no left ventricular hypertrophy. Left ventricular diastolic parameters are consistent with Grade I diastolic dysfunction (impaired relaxation). Indeterminate filling pressures. Right Ventricle: The right ventricular size is normal. No increase in right ventricular wall thickness. Right ventricular systolic function is normal. There is moderately elevated pulmonary artery systolic pressure. The tricuspid regurgitant velocity is 2.90 m/s, and with an assumed right atrial pressure of 15 mmHg, the estimated right ventricular systolic pressure is 45.9 mmHg. Left Atrium: Left atrial size was normal in size. Right Atrium: Right atrial size was normal in size. Pericardium: There is no evidence of pericardial effusion. Mitral Valve: The mitral valve is abnormal. There is mild calcification of the posterior mitral valve leaflet(s). Mild to moderate mitral annular calcification. Trivial mitral valve regurgitation. Tricuspid Valve: The tricuspid valve is grossly normal. Tricuspid valve regurgitation is trivial. Aortic Valve: The aortic valve is tricuspid. Aortic valve  regurgitation is not visualized. Pulmonic Valve: The pulmonic valve was normal in structure. Pulmonic valve regurgitation is not visualized. Aorta: The aortic root and ascending aorta are structurally normal, with no evidence of dilitation. Venous: The inferior vena cava is dilated in size with less than 50% respiratory variability, suggesting right atrial pressure of 15 mmHg. IAS/Shunts: No atrial level shunt detected by color flow Doppler.  LEFT VENTRICLE PLAX 2D LVIDd:         4.59 cm         Diastology LVIDs:         3.26 cm         LV e' medial:    6.96 cm/s LV PW:         0.99 cm         LV E/e' medial:  12.7 LV IVS:        0.77 cm         LV e' lateral:   9.03 cm/s                                LV E/e' lateral: 9.8                                 3D Volume EF                                LV 3D EF:    Left                                             ventricul                                             ar                                             ejection  fraction                                             by 3D                                             volume is                                             62 %.                                 3D Volume EF:                                3D EF:        62 %                                LV EDV:       98 ml                                LV ESV:       37 ml                                LV SV:        61 ml RIGHT VENTRICLE RV Basal diam:  3.44 cm RV Mid diam:    2.35 cm RV S prime:     12.50 cm/s TAPSE (M-mode): 2.4 cm LEFT ATRIUM             Index        RIGHT ATRIUM           Index LA Vol (A2C):   30.3 ml 21.79 ml/m  RA Area:     12.40 cm LA Vol (A4C):   26.3 ml 18.91 ml/m  RA Volume:   27.40 ml  19.70 ml/m LA Biplane Vol: 28.1 ml 20.21 ml/m  AORTIC VALVE LVOT Vmax:   114.00 cm/s LVOT Vmean:  73.200 cm/s LVOT VTI:    0.173 m  AORTA Ao Root diam: 2.90 cm MITRAL VALVE               TRICUSPID VALVE MV Area  (PHT): 5.75 cm    TR Peak grad:   33.6 mmHg MV Decel Time: 132 msec    TR Vmax:        290.00 cm/s MR Peak grad: 154.3 mmHg MR Vmax:      621.00 cm/s  SHUNTS MV E velocity: 88.40 cm/s  Systemic VTI: 0.17 m MV A velocity: 88.40 cm/s MV E/A ratio:  1.00 Lyman Bishop MD Electronically signed by Lyman Bishop MD Signature Date/Time: 06/12/2022/3:51:06 PM    Final      Assessment & Plan:   COPD with acute exacerbation [facility]) - Patient develop  acute symptoms of cough and dyspnea last week. Currently on azithromycin and prednisone taper. Symptoms are improving. Checking CXR. Advised she continue Trelegy 125mcg one puff daily, mucinex 600mg  twice a day and Albuterol nebulizer twice a day for the next 3-5 day until symptoms are better. She is scheduled for pulmonary function testing in January.   Chronic respiratory failure with hypoxia (HCC) - Patient noticed O2 was dropping 60% 3L POC with exertion - Oxygen 96% 3L POC today in office - Continue 5L continuous oxygen  - Placing an order for patient to be provided POC that can go up to 5L pulsed  - Advised patient use IS 10/hour while awake    Martyn Ehrich, NP 06/23/2022

## 2022-06-23 NOTE — Progress Notes (Signed)
Please let patient know CXR showed some infection upper lung. Since she is getting better no change to plan- complete abx as directed. If symptoms worsen call office

## 2022-06-24 ENCOUNTER — Telehealth: Payer: Self-pay | Admitting: Primary Care

## 2022-06-24 NOTE — Telephone Encounter (Signed)
She needs 5L

## 2022-06-24 NOTE — Telephone Encounter (Signed)
Beth Daniels Daniels is needing O2 order with specific LPM.  Order placed 06/23/22 states, Pt currently has older model POC thru [employer]. Per Beth Daniels Daniels, Have [employer] fax [place] order for larger POC. Patients current POC only goes up to 3 lpm. May need up to 5-6 lpm when SaO2% drops below 90%.  No ambulatory walk test results at last OV. Beth Daniels Daniels stated insurance will only cover new POC with order stating 4LPM or less.   Message routed to Beth,NP to advise

## 2022-06-25 NOTE — Progress Notes (Signed)
Called patient.  Gave results.  No further questions noted.

## 2022-06-25 NOTE — Telephone Encounter (Signed)
I just spoke with patient Beth Daniels.  Patient called [employer] and they were faxing an order over today for Beth Barrow, NP to sign.  Patient is very anxious to get the order in today and she will have [employer] ship the new POC overnight.  Patient is aware her insurance will NOT pay for a new POC that goes up to (specifically) 5 lpm and is willing to pay out of pocket for the new POC.  First order stated 5-6 lpm and [facility] cannot accept only a specific number for lpm. Patient states she is in contact with our triage nurse, Beth Daniels. And has sent a MyChart message to her.  I will reach out to [facility] and put an orders only encounter for a new POC that goes up.  *note:  Patient states she spoke with Beth Daniels at [facility] and they have a machine called the Rove-6 that patient wants that will go up to 5 lpm.  Patient would like an order sent in for this to [employer].

## 2022-06-25 NOTE — Telephone Encounter (Signed)
UPDATE:  Legrand Como with [employer] faxed order for POC that goes up to 6 lpm (order per Geraldo Pitter for 5 lpm).  Worked with Judeen Hammans and Vallarie Mare [facility]).  Eric Form, NP signed order and Judeen Hammans faxed order back to Legrand Como at [place].  Informed patient order was received, signed and faxed back to [employer].  [facility] should be contacting patient to have the ROVE-6 POC (what patient wants) shipped overnight to her.  Patient aware she is paying out of pocket for this as her insurance will not cover this.

## 2022-07-01 ENCOUNTER — Telehealth: Payer: Self-pay | Admitting: *Deleted

## 2022-07-01 NOTE — Telephone Encounter (Signed)
POC form faxed to [employer].

## 2022-07-04 ENCOUNTER — Telehealth: Payer: Self-pay | Admitting: Adult Health

## 2022-07-04 NOTE — Telephone Encounter (Signed)
Mr. Dunnavant presented at the front desk w/an O2 machine to donate and lots of Anoro Ellipta (unopened). I will mark it for you up front by Dorothyann Peng unless you will please advise where I can route it. Thank you and Happy New Year. (Icard is Dr.)

## 2022-07-09 NOTE — Telephone Encounter (Signed)
Okay please contact Beth Daniels /Triage to get them

## 2022-07-09 NOTE — Telephone Encounter (Signed)
I got the box yesterday.  Thanks.

## 2022-07-09 NOTE — Telephone Encounter (Signed)
Beth Daniels,  Upfront there is a lot of Anoro that was donated by patient.   They sent the message to TP for some reason.  Wanted to let you know since you do samples.  Thank you

## 2022-07-16 ENCOUNTER — Other Ambulatory Visit: Payer: Self-pay | Admitting: Neurology

## 2022-07-16 ENCOUNTER — Other Ambulatory Visit: Payer: Self-pay | Admitting: *Deleted

## 2022-07-16 DIAGNOSIS — J441 Chronic obstructive pulmonary disease with (acute) exacerbation: Secondary | ICD-10-CM

## 2022-07-17 ENCOUNTER — Ambulatory Visit (INDEPENDENT_AMBULATORY_CARE_PROVIDER_SITE_OTHER): Payer: Medicare Other | Admitting: Adult Health

## 2022-07-17 ENCOUNTER — Ambulatory Visit: Payer: Medicare Other

## 2022-07-17 ENCOUNTER — Encounter: Payer: Self-pay | Admitting: Adult Health

## 2022-07-17 VITALS — BP 136/58 | HR 108 | Temp 98.3°F | Ht 61.0 in | Wt 94.6 lb

## 2022-07-17 DIAGNOSIS — J9611 Chronic respiratory failure with hypoxia: Secondary | ICD-10-CM | POA: Diagnosis not present

## 2022-07-17 DIAGNOSIS — R0602 Shortness of breath: Secondary | ICD-10-CM

## 2022-07-17 DIAGNOSIS — J441 Chronic obstructive pulmonary disease with (acute) exacerbation: Secondary | ICD-10-CM

## 2022-07-17 DIAGNOSIS — I272 Pulmonary hypertension, unspecified: Secondary | ICD-10-CM | POA: Insufficient documentation

## 2022-07-17 DIAGNOSIS — E43 Unspecified severe protein-calorie malnutrition: Secondary | ICD-10-CM

## 2022-07-17 MED ORDER — PREDNISONE 10 MG PO TABS: ORAL_TABLET | ORAL | 0 refills | Status: DC

## 2022-07-17 NOTE — Assessment & Plan Note (Signed)
Slow to resolve COPD exacerbation.  Will repeat chest x-ray.  Patient will come back tomorrow as her x-ray is unavailable today.  She also will return for labs with a D-dimer and be met. Will treat with a short course of prednisone over the next 10 days.  Plan  Patient Instructions  Labs today   Prednisone 20mg  daily for 5 days and then 10mg  daily  for 5 days, then stop .  Mucinex DM Twice daily  As needed  congestion.  Use Flutter valve Three times a day   Albuterol inhaler or neb As needed   Continue on Oxygen  5l/m to keep sats >88-90%.  Set up for Overnight oximetry on 5l/m O2 .  High protein diet  Continue on TRELEGY 1 puff daily, rinse after use.  Activity as tolerated  Follow up with Dr. [name] or Emari Hreha NP in 6 weeks and As needed   Please contact office for sooner follow up if symptoms do not improve or worsen or seek emergency care

## 2022-07-17 NOTE — Assessment & Plan Note (Signed)
Increased oxygen demand now requiring 5 L of pulsed oxygen.  Also lower oxygen levels when waking up for  in the morning.  Will get DME to provide a shorter oxygen tubing for nighttime use Check overnight oximetry on 5 L  Plan  Patient Instructions  Labs today   Prednisone 20mg  daily for 5 days and then 10mg  daily  for 5 days, then stop .  Mucinex DM Twice daily  As needed  congestion.  Use Flutter valve Three times a day   Albuterol inhaler or neb As needed   Continue on Oxygen  5l/m to keep sats >88-90%.  Set up for Overnight oximetry on 5l/m O2 .  High protein diet  Continue on TRELEGY 1 puff daily, rinse after use.  Activity as tolerated  Follow up with Dr. [name] or [name] in 6 weeks and As needed   Please contact office for sooner follow up if symptoms do not improve or worsen or seek emergency care

## 2022-07-17 NOTE — Assessment & Plan Note (Signed)
High-protein diet 

## 2022-07-17 NOTE — Patient Instructions (Addendum)
Labs today   Prednisone 20mg  daily for 5 days and then 10mg  daily  for 5 days, then stop .  Mucinex DM Twice daily  As needed  congestion.  Use Flutter valve Three times a day   Albuterol inhaler or neb As needed   Continue on Oxygen  5l/m to keep sats >88-90%.  Set up for Overnight oximetry on 5l/m O2 .  High protein diet  Continue on TRELEGY 1 puff daily, rinse after use.  Activity as tolerated  Follow up with Dr. [name] or Ramonda Galyon NP in 6 weeks and As needed   Please contact office for sooner follow up if symptoms do not improve or worsen or seek emergency care

## 2022-07-17 NOTE — Progress Notes (Signed)
@Patient  ID: Beth Daniels, female    DOB: 07-23-1945, 77 y.o.   MRN: [id number]  Chief Complaint  Patient presents with   Follow-up    Referring provider: Shon Baton, MD  HPI: 77 year old female former smoker followed for severe emphysema, lung nodules and chronic respiratory failure on oxygen Presumed lung cancer with concerning left lower lobe nodule status post SBRT March 2023 Medical history significant for breast cancer  TEST/EVENTS :  CT chest May 14, 2022 biapical pleural-parenchymal scarring, emphysema, stable 8 mm right lower lobe nodule, 9 mm right upper lobe nodule stable, new patchy consolidation and groundglass in the inferior lingula obscuring previous 10 mm nodule measured on Nov 08, 2021, standard similar 9 mm left lower lobe nodule, 3 mm left lower lobe nodule no new nodules., Mildly enlarged heart and left ventricle. No pericardial effusion noted.   07/17/2022 Follow up : Emphysema, chronic respiratory failure, presumed lung cancer with concerning left lower lobe nodule status post SBRT Patient returns for 1 month follow-up.  Patient complains over the last month her breathing has not been doing quite as good.  She was seen 1 month ago with ongoing symptoms of cough and congestion.  She was given a Z-Pak and prednisone taper.  Chest x-ray with increased markings in the upper lobes and a small amount of bilateral pleural fluid.  Also is noticing that she wakes up with lower oxygen levels first thing in the morning.  Today in the office on POC patient was able to walk on 5 L pulsed oxygen with O2 saturations maintaining above 92%.  Of note her pulse oximeter was reading into the mid to upper 80s.  We did use a forehead probe.  Patient says she still has some cough and congestion.  Mucus is thick at times.  She denies any fever, chest pain, orthopnea, calf pain. Appetite continues to be low.  Weight has been steady.  We discussed a high-protein diet 2D echo done June 17, 2022 showed preserved EF mild Solik dysfunction, moderate pulmonary hypertension with a PA P at 48 mmHg.  No Known Allergies  Immunization History  Administered Date(s) Administered   Fluad Quad(high Dose 65+) 05/15/2020, 04/21/2022   Influenza, High Dose Seasonal PF 05/04/2013, 05/04/2015, 06/04/2016, 05/15/2017, 04/29/2018, 04/04/2019   Influenza,inj,quad, With Preservative 04/06/2018   Influenza-Unspecified 07/15/2012, 05/04/2013, 05/03/2014, 05/04/2015, 06/04/2016, 05/15/2017   PFIZER(Purple Top)SARS-COV-2 Vaccination 08/12/2019, 09/06/2019, 03/02/2020   Pneumococcal Conjugate-13 06/20/2015   Pneumococcal Polysaccharide-23 08/14/2011   Zoster Recombinat (Shingrix) 04/30/2018, 07/31/2018   Zoster, Live 12/07/2012    Past Medical History:  Diagnosis Date   Alcohol abuse    Anemia    Cancer ([place])    breast right   Chronic hyponatremia    COPD (chronic obstructive pulmonary disease) (HCC)    DCIS (ductal carcinoma in situ)    Dyspnea    Emphysema of lung (HCC)    Osteoporosis    Overactive bladder    PVD (peripheral vascular disease) ([place])    Tobacco use     Tobacco History: Social History   Tobacco Use  Smoking Status Former   Packs/day: 1.50   Years: 50.00   Total pack years: 75.00   Types: Cigarettes   Start date: 72   Quit date: 10/06/2016   Years since quitting: 5.7  Smokeless Tobacco Never   Counseling given: Not Answered   Outpatient Medications Prior to Visit  Medication Sig Dispense Refill   albuterol (PROVENTIL) (2.5 MG/3ML) 0.083% nebulizer solution Take 3 mLs (  2.5 mg total) by nebulization every 4 (four) hours as needed for wheezing or shortness of breath. 75 mL 3   albuterol (VENTOLIN HFA) 108 (90 Base) MCG/ACT inhaler USE 1 INHALATION EVERY 6 HOURS AS NEEDED FOR WHEEZING OR SHORTNESS OF BREATH 17 g 3   aspirin EC 81 MG tablet Take 81 mg by mouth daily.     atorvastatin (LIPITOR) 40 MG tablet      azelastine (ASTELIN) 0.1 % nasal spray USE 1 SPRAY  IN EACH NOSTRIL TWICE A DAY 30 mL 6   Azelastine-Fluticasone 137-50 MCG/ACT SUSP Place 1 spray into the nose daily. 23 g 6   conjugated estrogens (PREMARIN) vaginal cream Place 1 Applicatorful vaginally every Monday, Wednesday, and Friday at 8 PM.     Cranberry 400 MG CAPS Take 1,200 mg by mouth daily at 2 PM.     denosumab (PROLIA) 60 MG/ML SOSY injection Inject 60 mg into the skin every 6 (six) months.      docusate sodium (COLACE) 100 MG capsule Take 1 capsule (100 mg total) by mouth 2 (two) times daily. (Patient taking differently: Take 100 mg by mouth at bedtime.) 10 capsule 0   Ferrous Fumarate (HEMOCYTE - 106 MG FE) 324 (106 Fe) MG TABS tablet Take 1 tablet by mouth daily at 3 pm.      fluticasone (FLONASE) 50 MCG/ACT nasal spray USE 2 SPRAYS IN EACH NOSTRIL TWICE A DAY 48 g 7   Fluticasone-Umeclidin-Vilant (TRELEGY ELLIPTA) 100-62.5-25 MCG/ACT AEPB Inhale 1 puff into the lungs daily at 6 (six) AM. 213 each 3   folic acid (FOLVITE) 1 MG tablet Take 1 mg by mouth daily.     gabapentin (NEURONTIN) 100 MG capsule TAKE 2 CAPSULES THREE TIMES A DAY 540 capsule 1   gabapentin (NEURONTIN) 300 MG capsule TAKE 2 CAPSULES THREE TIMES A DAY 540 capsule 1   guaiFENesin (MUCINEX) 600 MG 12 hr tablet Take 600 mg by mouth 2 (two) times daily.     hydrocortisone 2.5 % cream Apply 1 application topically daily.     Multiple Vitamin (MULTI-VITAMIN) tablet Take 1 tablet by mouth daily.      mupirocin ointment (BACTROBAN) 2 % Apply 1 application topically daily as needed (wound care).      MYRBETRIQ 50 MG TB24 tablet Take 50 mg by mouth at bedtime.     nicotine polacrilex (COMMIT) 4 MG lozenge Take 4 mg by mouth as needed for smoking cessation.      OXYGEN Inhale 5 L into the lungs continuous.     pantoprazole (PROTONIX) 40 MG tablet Take 40 mg by mouth daily.     sodium chloride (OCEAN) 0.65 % SOLN nasal spray Place 1 spray into both nostrils 2 (two) times daily. As needed during the day     Vitamin D,  Cholecalciferol, 25 MCG (1000 UT) TABS Take 1,000 Units by mouth daily.     azithromycin (ZITHROMAX) 250 MG tablet Take 1 tablet (250 mg total) by mouth as directed. (Patient not taking: Reported on 07/17/2022) 6 tablet 0   doxycycline (VIBRA-TABS) 100 MG tablet Take 1 tablet (100 mg total) by mouth 2 (two) times daily. 14 tablet 0   predniSONE (DELTASONE) 10 MG tablet 4 x 2 days, 3 x 2 days, 2 x 2 days, 1 x 2 days (Patient not taking: Reported on 07/17/2022) 20 tablet 0   No facility-administered medications prior to visit.     Review of Systems:   Constitutional:   No  weight  loss, night sweats,  Fevers, chills,  +fatigue, or  lassitude.  HEENT:   No headaches,  Difficulty swallowing,  Tooth/dental problems, or  Sore throat,                No sneezing, itching, ear ache, nasal congestion, post nasal drip,   CV:  No chest pain,  Orthopnea, PND, swelling in lower extremities, anasarca, dizziness, palpitations, syncope.   GI  No heartburn, indigestion, abdominal pain, nausea, vomiting, diarrhea, change in bowel habits, loss of appetite, bloody stools.   Resp:  No chest wall deformity  Skin: no rash or lesions.  GU: no dysuria, change in color of urine, no urgency or frequency.  No flank pain, no hematuria   MS:  No joint pain or swelling.  No decreased range of motion.  No back pain.    Physical Exam  BP (!) 136/58 (BP Location: Left Arm, Patient Position: Sitting, Cuff Size: Normal)   Pulse (!) 108   Temp 98.3 F (36.8 C) (Oral)   Ht 5\' 1"  (1.549 m)   Wt 94 lb 9.6 oz (42.9 kg)   SpO2 95%   BMI 17.87 kg/m   GEN: A/Ox3; pleasant , NAD, thin, frail, rolling walker, oxygen   HEENT:  [place]/AT,  EACs-clear, TMs-wnl, NOSE-clear, THROAT-clear, no lesions, no postnasal drip or exudate noted.   NECK:  Supple w/ fair ROM; no JVD; normal carotid impulses w/o bruits; no thyromegaly or nodules palpated; no lymphadenopathy.    RESP decreased breath sounds in the bases  no accessory  muscle use, no dullness to percussion  CARD:  RRR, no m/r/g, no peripheral edema, pulses intact, no cyanosis or clubbing.  GI:   Soft & nt; nml bowel sounds; no organomegaly or masses detected.   Musco: Warm bil, no deformities or joint swelling noted.   Neuro: alert, no focal deficits noted.    Skin: Warm, no lesions or rashes    Lab Results:    BNP    Imaging: DG Chest 2 View  Result Date: 06/23/2022 CLINICAL DATA:  COPD exacerbation. EXAM: CHEST - 2 VIEW COMPARISON:  02/12/2021 as well as CT 05/14/2022 FINDINGS: Lungs are adequately inflated with opacification over the upper lobes bilaterally as well as the lingula likely infection. Mild blunting of the posterior costophrenic angles likely small amount of bilateral pleural fluid. Evidence of emphysematous disease. Cardiomediastinal silhouette and remainder of the exam is unchanged. Moderate compression fracture over the lower thoracic spine unchanged. IMPRESSION: 1. Opacification over the upper lobes bilaterally as well as the lingula likely infection. Small amount of bilateral pleural fluid. 2. Emphysematous disease. Electronically Signed   By: Marin Olp M.D.   On: 06/23/2022 12:33          No data to display          No results found for: "NITRICOXIDE"      Assessment & Plan:   COPD with acute exacerbation ([place]) Slow to resolve COPD exacerbation.  Will repeat chest x-ray.  Patient will come back tomorrow as her x-ray is unavailable today.  She also will return for labs with a D-dimer and be met. Will treat with a short course of prednisone over the next 10 days.  Plan  Patient Instructions  Labs today   Prednisone 20mg  daily for 5 days and then 10mg  daily  for 5 days, then stop .  Mucinex DM Twice daily  As needed  congestion.  Use Flutter valve Three times a day   Albuterol  inhaler or neb As needed   Continue on Oxygen  5l/m to keep sats >88-90%.  Set up for Overnight oximetry on 5l/m O2 .  High  protein diet  Continue on TRELEGY 1 puff daily, rinse after use.  Activity as tolerated  Follow up with Dr. [name] or Corbyn Steedman NP in 6 weeks and As needed   Please contact office for sooner follow up if symptoms do not improve or worsen or seek emergency care      Chronic respiratory failure with hypoxia (HCC) Increased oxygen demand now requiring 5 L of pulsed oxygen.  Also lower oxygen levels when waking up for  in the morning.  Will get DME to provide a shorter oxygen tubing for nighttime use Check overnight oximetry on 5 L  Plan  Patient Instructions  Labs today   Prednisone 20mg  daily for 5 days and then 10mg  daily  for 5 days, then stop .  Mucinex DM Twice daily  As needed  congestion.  Use Flutter valve Three times a day   Albuterol inhaler or neb As needed   Continue on Oxygen  5l/m to keep sats >88-90%.  Set up for Overnight oximetry on 5l/m O2 .  High protein diet  Continue on TRELEGY 1 puff daily, rinse after use.  Activity as tolerated  Follow up with Dr. [name] or Adalai Perl NP in 6 weeks and As needed   Please contact office for sooner follow up if symptoms do not improve or worsen or seek emergency care      Severe protein-calorie malnutrition ([place]) High-protein diet    I spent  44  minutes dedicated to the care of this patient on the date of this encounter to include pre-visit review of records, face-to-face time with the patient discussing conditions above, post visit ordering of testing, clinical documentation with the electronic health record, making appropriate referrals as documented, and communicating necessary findings to members of the patients care team.   Rexene Edison, NP 07/17/2022

## 2022-07-17 NOTE — Assessment & Plan Note (Signed)
Pulmonary hypertension most likely consistent with WHO class III with underlying severe lung disease with emphysema.  Does not appear to be volume overloaded.  Hold on diuretics at this time.  Maintain O2 saturations greater than 88 to 90%.  Plan  Patient Instructions  Labs today   Prednisone 20mg  daily for 5 days and then 10mg  daily  for 5 days, then stop .  Mucinex DM Twice daily  As needed  congestion.  Use Flutter valve Three times a day   Albuterol inhaler or neb As needed   Continue on Oxygen  5l/m to keep sats >88-90%.  Set up for Overnight oximetry on 5l/m O2 .  High protein diet  Continue on TRELEGY 1 puff daily, rinse after use.  Activity as tolerated  Follow up with Dr. [name] or Shreeya Recendiz NP in 6 weeks and As needed   Please contact office for sooner follow up if symptoms do not improve or worsen or seek emergency care

## 2022-07-18 ENCOUNTER — Ambulatory Visit (HOSPITAL_COMMUNITY)
Admission: RE | Admit: 2022-07-18 | Discharge: 2022-07-18 | Disposition: A | Discharge status: Home / Self Care | Payer: Medicare Other | Source: Ambulatory Visit | Attending: Adult Health | Admitting: Adult Health

## 2022-07-18 ENCOUNTER — Ambulatory Visit (INDEPENDENT_AMBULATORY_CARE_PROVIDER_SITE_OTHER)
Admission: RE | Admit: 2022-07-18 | Discharge: 2022-07-18 | Disposition: A | Discharge status: Home / Self Care | Payer: Medicare Other | Source: Ambulatory Visit | Attending: Adult Health | Admitting: Adult Health

## 2022-07-18 ENCOUNTER — Other Ambulatory Visit: Payer: Self-pay | Admitting: Adult Health

## 2022-07-18 ENCOUNTER — Telehealth: Payer: Self-pay | Admitting: Pulmonary Disease

## 2022-07-18 ENCOUNTER — Telehealth: Payer: Self-pay | Admitting: Adult Health

## 2022-07-18 ENCOUNTER — Telehealth: Payer: Self-pay | Admitting: *Deleted

## 2022-07-18 DIAGNOSIS — J441 Chronic obstructive pulmonary disease with (acute) exacerbation: Secondary | ICD-10-CM

## 2022-07-18 DIAGNOSIS — R7989 Other specified abnormal findings of blood chemistry: Secondary | ICD-10-CM

## 2022-07-18 DIAGNOSIS — R0602 Shortness of breath: Secondary | ICD-10-CM

## 2022-07-18 MED ORDER — AMOXICILLIN-POT CLAVULANATE 875-125 MG PO TABS: 1.0000 | ORAL_TABLET | Freq: Two times a day (BID) | ORAL | 0 refills | Status: DC

## 2022-07-18 MED ORDER — IOHEXOL 350 MG/ML SOLN
75.0000 mL | Freq: Once | INTRAVENOUS | Status: AC | PRN
  Administered 2022-07-18: 75 mL via INTRAVENOUS

## 2022-07-18 NOTE — Telephone Encounter (Signed)
Received call report from [facility] with Dr. Ferd Hibbs office on pt's d-dimer. Verbal was given to [facility] and the d-dimer was stat and result was 1.60.

## 2022-07-18 NOTE — Telephone Encounter (Signed)
Lab work shows positive D-dimer will need a stat CTA chest to rule out PE Pt aware

## 2022-07-18 NOTE — Telephone Encounter (Signed)
[facility] ret [facility] call for this PT  [phone]  Pls ret call for Morrie Sheldon  [phone]  Dr. Timothy Lasso office  Tel operator to page her.

## 2022-07-18 NOTE — Telephone Encounter (Signed)
Dr. Timothy Lasso calling Heather back. Please try again.

## 2022-07-18 NOTE — Telephone Encounter (Signed)
Order for STAT CTa has been placed. Nothing further needed.

## 2022-07-18 NOTE — Telephone Encounter (Signed)
Dr. Timothy Lasso office said they have the labs ordered  to be done at their office today. As soon they come back they will let us know. Gave her our FAX #.

## 2022-07-18 NOTE — Telephone Encounter (Signed)
Dr. Ferd Hibbs office returned a call and the line dropped.  She stated she was returning Heather's call before the line went out.  Please call back.

## 2022-07-18 NOTE — Telephone Encounter (Signed)
ATC Morrie Sheldon RN [phone]) at Dr. Ferd Hibbs office regarding labs (D-Dimer and BMP).  LVM that we need the D-dimer and BMP as stat so we can have the results prior to the weekend.  Left call back # as [phone].  Will await return call.

## 2022-07-18 NOTE — Telephone Encounter (Signed)
Stat call report.

## 2022-07-18 NOTE — Telephone Encounter (Signed)
Will need office visit in 3 to 4 weeks with follow-up chest x-ray

## 2022-07-18 NOTE — Telephone Encounter (Signed)
Patient is already scheduled for an OV on 02/22. Will close this encounter.

## 2022-07-18 NOTE — Telephone Encounter (Signed)
Called and spoke with Shanda Bumps in the CT department. She was calling to let TP know that the patient's CT was negative per the radiologist. Will route to TP so she is aware.

## 2022-07-21 ENCOUNTER — Other Ambulatory Visit: Payer: Self-pay | Admitting: *Deleted

## 2022-07-21 DIAGNOSIS — R9389 Abnormal findings on diagnostic imaging of other specified body structures: Secondary | ICD-10-CM

## 2022-07-21 LAB — POCT I-STAT CREATININE: Creatinine, Ser: 0.4 mg/dL — ABNORMAL LOW (ref 0.44–1.00)

## 2022-07-21 NOTE — Progress Notes (Signed)
Called and spoke with patient, she had spoken with [name] previously regarding results.  Advised she will need a CXR prior to next OV.  Nothing further needed.

## 2022-07-22 NOTE — Progress Notes (Signed)
Got info

## 2022-07-28 NOTE — Telephone Encounter (Signed)
Went over x-ray and CT results.  Please make her an appointment in 2 weeks with Dr. Wynona Neat  with a chest x-ray Will need CT scan April 2024.  Without contrast

## 2022-07-28 NOTE — Telephone Encounter (Signed)
Received message from patient:   [name], two things to ask you: 1.  New elongated 10mm 10 mm, spiculated, does this need radiation at this point in time?  Dr. Wynona Neat mentioned that I would need a CXR before next visit, who schedules?  2. Can you see if PA Laurence Aly- [facility] who administered radiation treatments has access to this current scan for any input she might have?    Thank you  Beth Daniels, Beth Daniels  DOB [id number]  I advised her that she could have the CXR the same day as her appt. I have also routed a copy of her CT scan to Laurence Aly at [facility].

## 2022-07-28 NOTE — Telephone Encounter (Signed)
aware

## 2022-08-04 ENCOUNTER — Telehealth: Payer: Self-pay | Admitting: Radiation Oncology

## 2022-08-04 DIAGNOSIS — C50519 Malignant neoplasm of lower-outer quadrant of unspecified female breast: Secondary | ICD-10-CM

## 2022-08-04 NOTE — Telephone Encounter (Signed)
Yes she can cancel and follow up with Dr. Lisbeth Renshaw for PET . Thank yall .

## 2022-08-04 NOTE — Telephone Encounter (Signed)
Attempted to call but line kept ringing and no voicemail picked up. Will call again

## 2022-08-04 NOTE — Telephone Encounter (Signed)
I called and spoke with the patient to let her know that Dr. Lisbeth Renshaw had reviewed her scans and recommended a PET scan 6 weeks from her most recent scan. She is in agreement. She describes being on multiple courses of antibiotics and recently increasing her O2 demands from 3L to 5L. She is in agreement with this plan.

## 2022-08-07 NOTE — Telephone Encounter (Signed)
Called and spoke with pt letting her know that TP said it would be okay for her to cancel appt 2/22 and for her to just follow up with Dr. Lisbeth Daniels after PET. Pt said she wanted to still keep her appt with TP. Nothing further needed.

## 2022-08-08 IMAGING — CT CT CHEST SUPER D W/O CM
1 of 2 series · 14 of 32 positions shown, 18 images · non-contrast
Comparison: 02/05/2021 chest CT.  02/04/2021 PET-CT.

CLINICAL DATA: Follow-up bilateral pulmonary nodules. History of
right breast cancer status post bilateral mastectomy. No evidence of
malignancy on bilateral lower lobe pulmonary nodule bronchoscopic
biopsy performed 02/12/2021. Former smoker.

EXAM:
CT CHEST WITHOUT CONTRAST
TECHNIQUE: Multidetector CT imaging of the chest was performed using thin slice
collimation for electromagnetic bronchoscopy planning purposes,
without intravenous contrast.

[Series 6: super d · axial · 0.57mm/px · z∈[-142,+146]mm · 14 of 404 slices shown, 18 images]
[im 22/404  mediastinal]
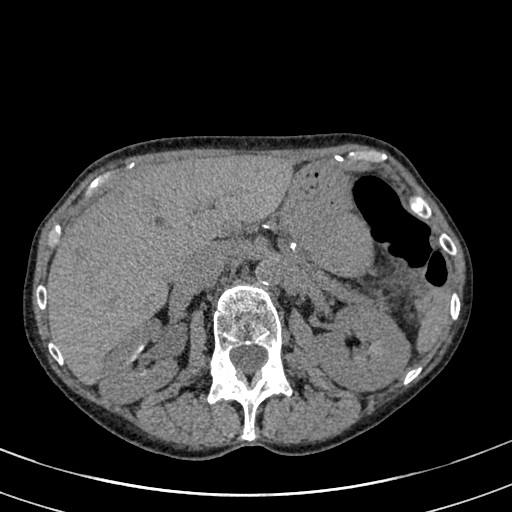
[im 22/404  lung]
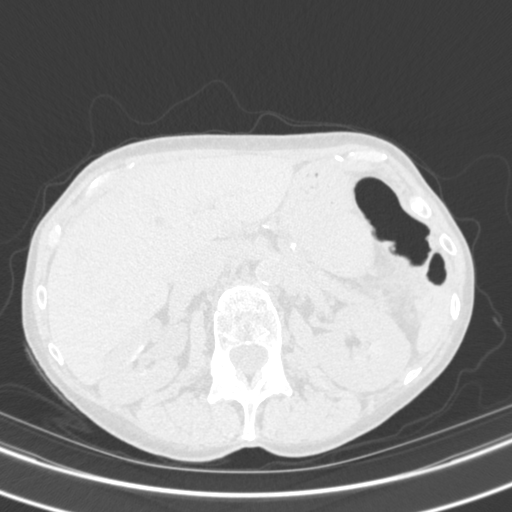
[im 64/404  lung]
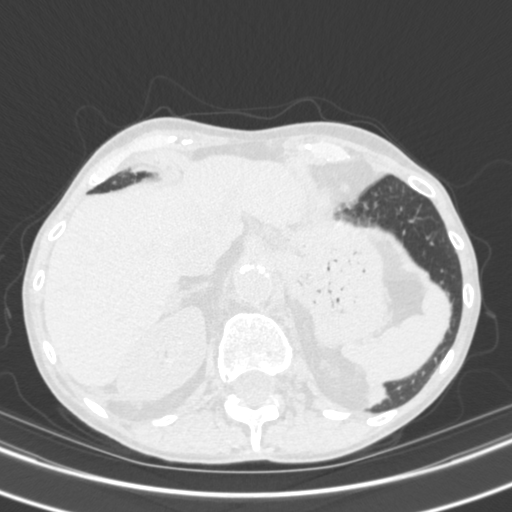
[im 85/404  lung]
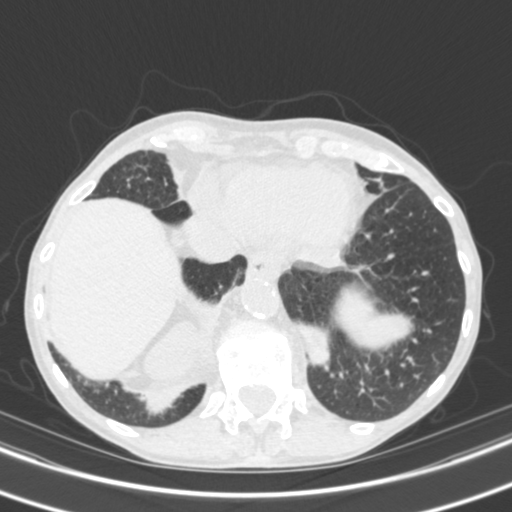
[im 128/404  lung]
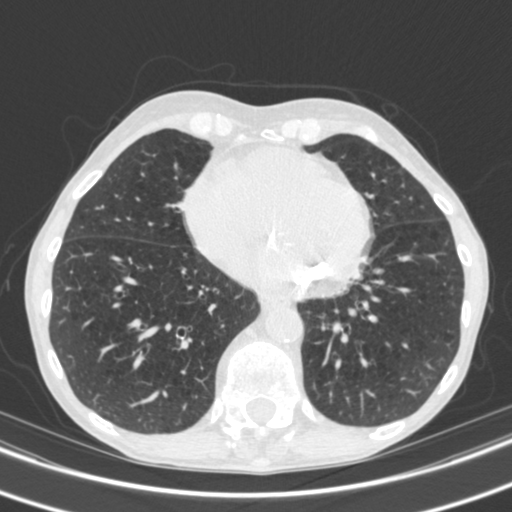
[im 135/404  mediastinal]
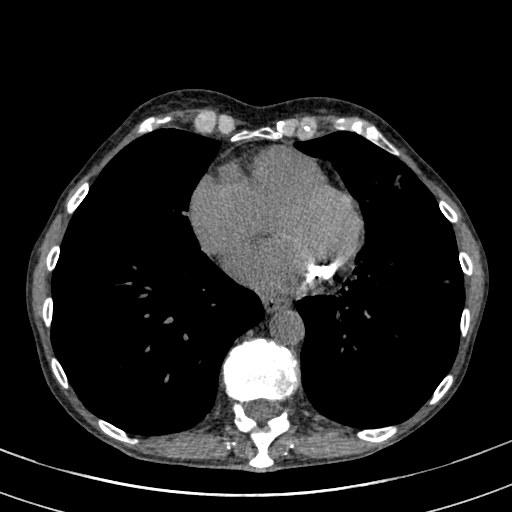
[im 135/404  lung]
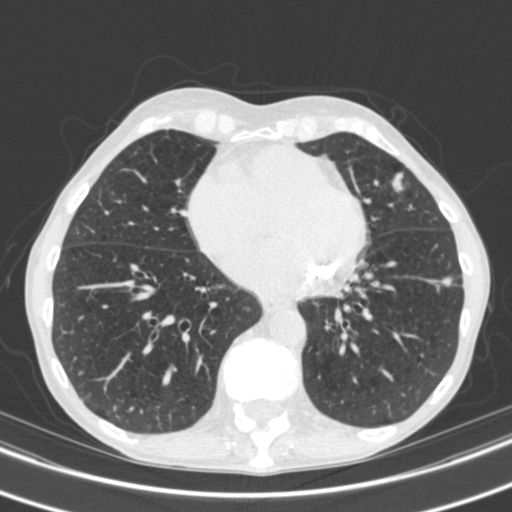
[im 170/404  lung]
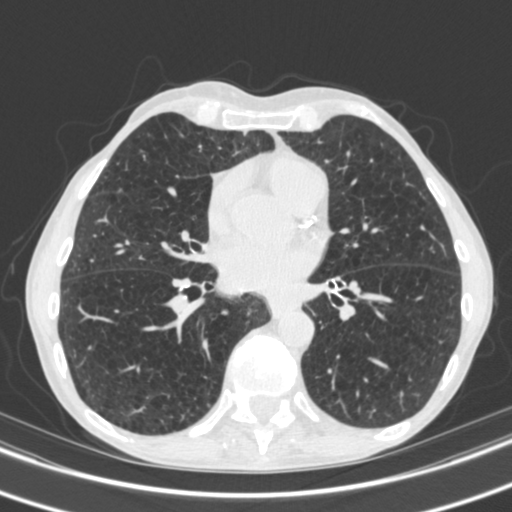
[im 190/404  lung]
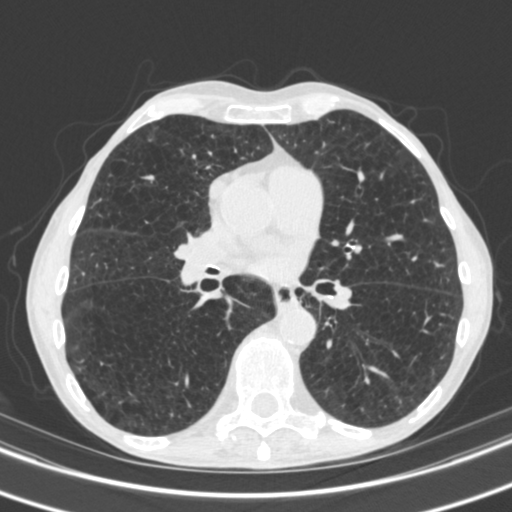
[im 213/404  lung]
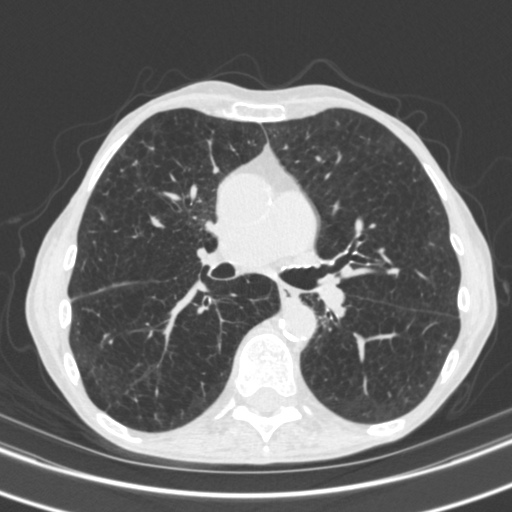
[im 234/404  mediastinal]
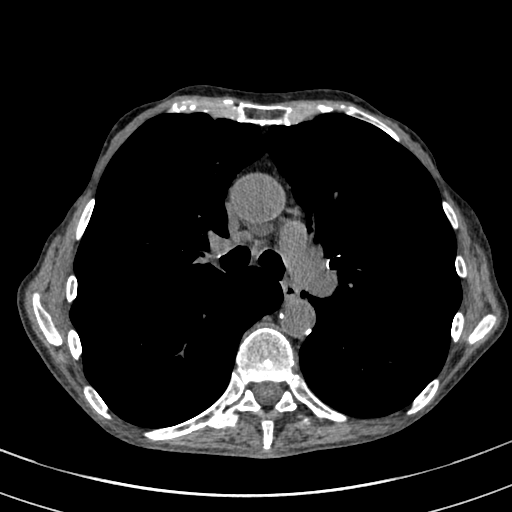
[im 234/404  lung]
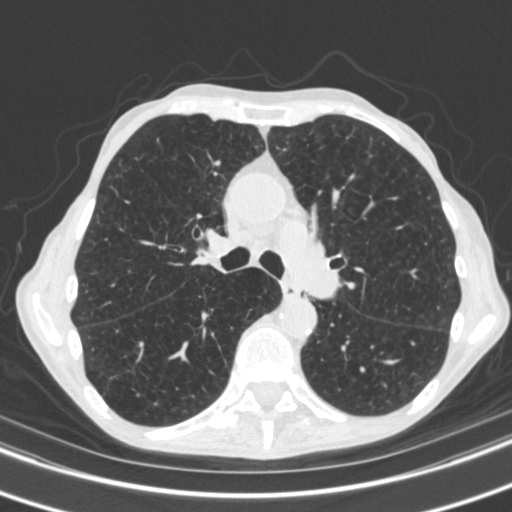
[im 269/404  lung]
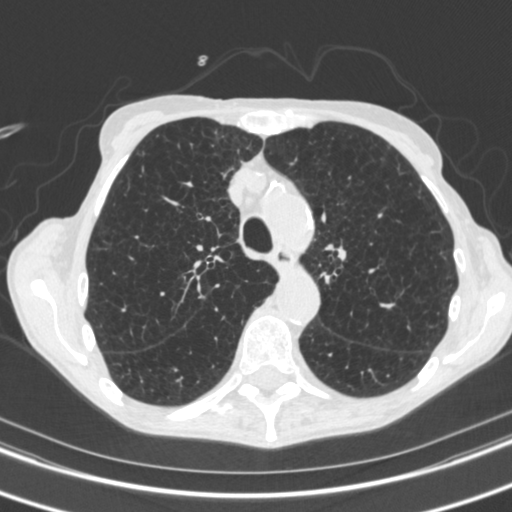
[im 276/404  lung]
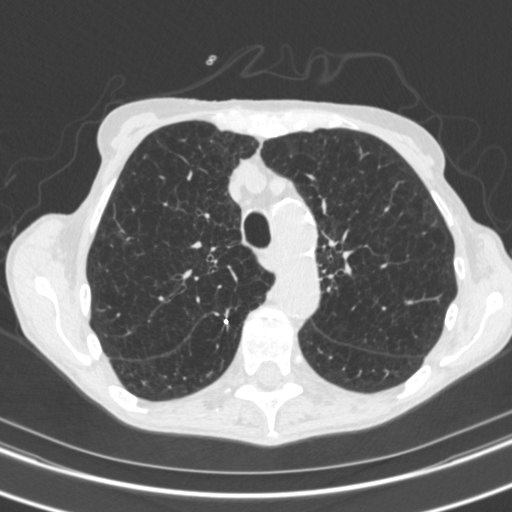
[im 319/404  lung]
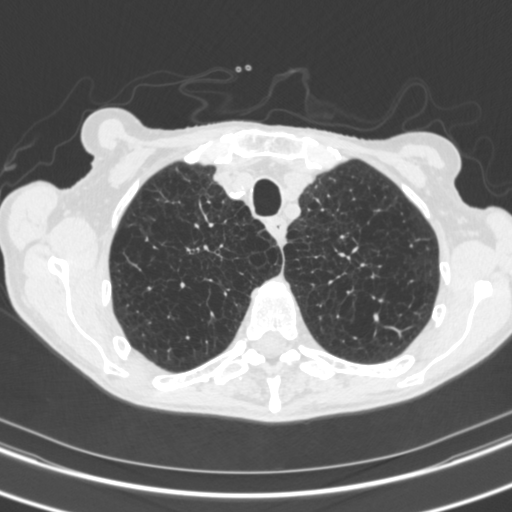
[im 340/404  mediastinal]
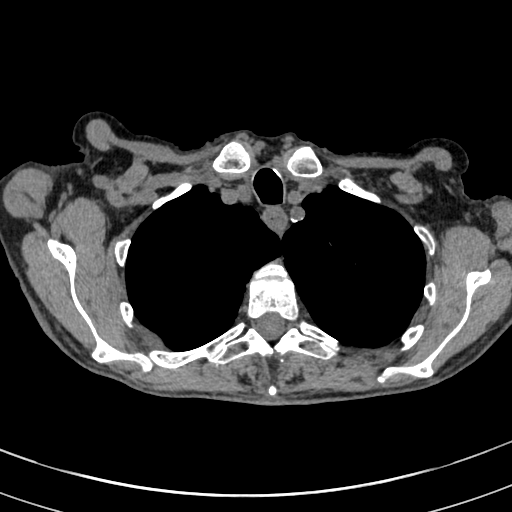
[im 340/404  lung]
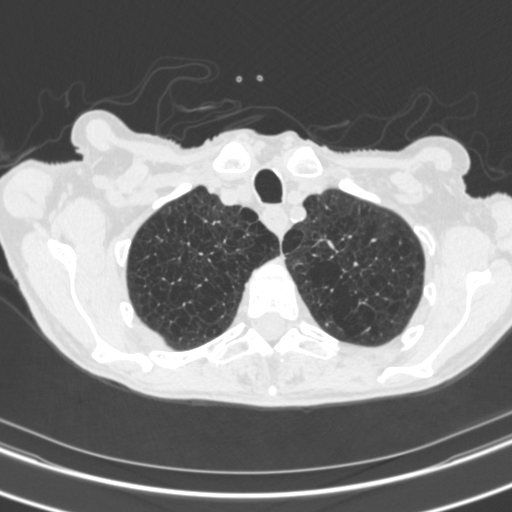
[im 382/404  lung]
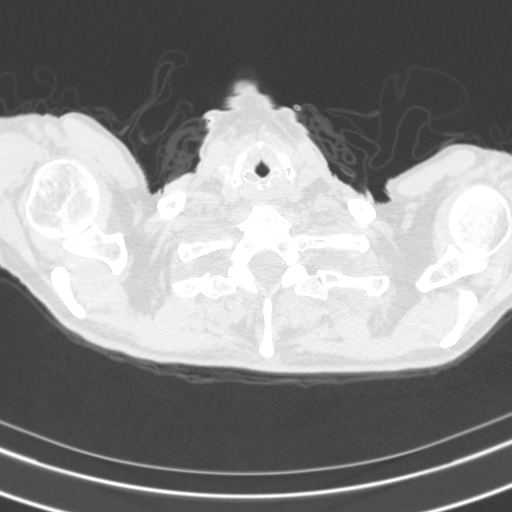

[14 of 32 positions shown; findings below may reference images not displayed]

FINDINGS: Cardiovascular: Normal heart size. No significant pericardial
effusion/thickening. Left anterior descending and left circumflex
coronary atherosclerosis. Atherosclerotic nonaneurysmal thoracic
aorta. Normal caliber pulmonary arteries.

Mediastinum/Nodes: No discrete thyroid nodules. Unremarkable
esophagus. No pathologically enlarged axillary, mediastinal or hilar
lymph nodes, noting limited sensitivity for the detection of hilar
adenopathy on this noncontrast study.

Lungs/Pleura: No pneumothorax. No pleural effusion. Severe
centrilobular emphysema. Anteromedial right upper lobe subpleural
1.0 cm solid pulmonary nodule (series 5/image 47), previously 1.0 cm
on 02/05/2021 using similar measurement technique, stable. Lobulated
solid 1.0 cm pulmonary nodule in the medial aspect of the superior
segment right lower lobe (series 5/image 49), previously 1.0 cm,
stable, with new adjacent biopsy clip. Subsolid 1.0 x 0.9 cm
lingular pulmonary nodule (series 5/image 109), slightly increased
from 0.9 x 0.8 cm on 02/05/2021 chest CT using similar measurement
technique and more clearly increased from 0.6 x 0.5 cm on 11/07/2019
chest CT. Peripheral left lower lobe solid 0.9 cm pulmonary nodule
(series 5/image 107), previously 0.9 cm on 02/05/2021 using similar
measurement technique, stable, with new biopsy clip located central
to this nodule. Stable 0.2 cm peripheral basilar left lower lobe
solid pulmonary nodule (series 5/image 130). No acute consolidative
airspace disease, lung masses or new significant pulmonary nodules.

Upper abdomen: Medullary nephrocalcinosis in the visualized upper
kidneys bilaterally, unchanged. Solid 1.6 cm right adrenal nodule
with density 20 HU, stable since at least 11/07/2019 CT, most
compatible with a benign adenoma.

Musculoskeletal: No aggressive appearing focal osseous lesions.
Chronic mild superior T3 vertebral compression fracture. Chronic
moderate T12 vertebral compression fracture. Mild thoracic
spondylosis.
IMPRESSION: 1. Continued slow growth of subsolid 1.0 cm lingular pulmonary
nodule, which measured 0.9 cm on 02/05/2021 chest CT and 0.6 cm on
11/07/2019 chest CT, suspicious for slow growing primary
bronchogenic adenocarcinoma.
2. Additional scattered bilateral solid pulmonary nodules are stable
and warrant continued chest CT surveillance.
3. No thoracic adenopathy.
4. Two-vessel coronary atherosclerosis.
5. Stable right adrenal adenoma.
6. Medullary nephrocalcinosis in the visualized upper kidneys
bilaterally.
7. Aortic Atherosclerosis (Q6ESY-ZHF.F) and Emphysema (Q6ESY-LOF.8).

## 2022-08-12 ENCOUNTER — Telehealth: Payer: Self-pay

## 2022-08-12 DIAGNOSIS — I70213 Atherosclerosis of native arteries of extremities with intermittent claudication, bilateral legs: Secondary | ICD-10-CM

## 2022-08-12 DIAGNOSIS — I779 Disorder of arteries and arterioles, unspecified: Secondary | ICD-10-CM

## 2022-08-12 NOTE — Telephone Encounter (Addendum)
Pt called requesting an appt to ensure her pulses were still good because she has a known occlusion.  Reviewed pt's chart, returned call for clarification, two identifiers used. Pt was last seen in office on 11/25/21 and a recall was not put in the system for a 6 mo f/u as requested in office note. Pt states that he LLE is "falling asleep". She has sharp pains on the bottom on her feet, which she takes Gabapentin for neuropathy. She notes some swelling in her BL feet. She denies any discoloration, coldness, pale color, or rest pain. Appts for ABI & PA were scheduled. Pt informed that there may be a wait in between [place] & PA appts. Confirmed understanding.

## 2022-08-14 ENCOUNTER — Other Ambulatory Visit (HOSPITAL_COMMUNITY): Payer: Self-pay | Admitting: *Deleted

## 2022-08-15 ENCOUNTER — Ambulatory Visit (HOSPITAL_COMMUNITY)
Admission: RE | Admit: 2022-08-15 | Discharge: 2022-08-15 | Disposition: A | Discharge status: Home / Self Care | Payer: Medicare Other | Source: Ambulatory Visit | Attending: Internal Medicine | Admitting: Internal Medicine

## 2022-08-15 DIAGNOSIS — M810 Age-related osteoporosis without current pathological fracture: Secondary | ICD-10-CM | POA: Diagnosis not present

## 2022-08-15 MED ORDER — DENOSUMAB 60 MG/ML ~~LOC~~ SOSY: 60.0000 mg | PREFILLED_SYRINGE | Freq: Once | SUBCUTANEOUS | Status: AC

## 2022-08-15 MED ORDER — DENOSUMAB 60 MG/ML ~~LOC~~ SOSY
PREFILLED_SYRINGE | SUBCUTANEOUS | Status: AC
  Administered 2022-08-15: 60 mg via SUBCUTANEOUS
  Filled 2022-08-15: qty 1

## 2022-08-18 ENCOUNTER — Ambulatory Visit (HOSPITAL_COMMUNITY)
Admission: RE | Admit: 2022-08-18 | Discharge: 2022-08-18 | Disposition: A | Discharge status: Home / Self Care | Payer: Medicare Other | Source: Ambulatory Visit | Attending: Surgery | Admitting: Surgery

## 2022-08-18 ENCOUNTER — Ambulatory Visit (INDEPENDENT_AMBULATORY_CARE_PROVIDER_SITE_OTHER): Payer: Medicare Other | Admitting: Physician Assistant

## 2022-08-18 VITALS — BP 114/59 | HR 97 | Temp 98.6°F | Resp 20 | Ht 61.0 in | Wt 95.5 lb

## 2022-08-18 DIAGNOSIS — I779 Disorder of arteries and arterioles, unspecified: Secondary | ICD-10-CM | POA: Insufficient documentation

## 2022-08-18 DIAGNOSIS — I70213 Atherosclerosis of native arteries of extremities with intermittent claudication, bilateral legs: Secondary | ICD-10-CM | POA: Insufficient documentation

## 2022-08-18 DIAGNOSIS — I739 Peripheral vascular disease, unspecified: Secondary | ICD-10-CM | POA: Diagnosis not present

## 2022-08-18 LAB — VAS US ABI WITH/WO TBI
Left ABI: 0.5
Right ABI: 1.07

## 2022-08-18 NOTE — Progress Notes (Signed)
[facility] HISTORY AND PHYSICAL   History of Present Illness:  Patient is a 77 y.o. year old female who presents for evaluation of claudication/PAD.   She had previously undergone multiple percutaneous interventions at [facility].  She underwent left superficial femoral artery stenting April 2018.  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  She subsequently had drug-coated balloon angioplasty for in-stent restenosis in March and again in November 2019 with cutting Balloon angioplasty of the left SFA and balloon angioplasty of the right external iliac artery.  She then underwent drug-eluting stent placement of left SFA due to in-stent stenosis with mechanical thrombectomy of left ATA and PTA by Dr. Trula Slade in September 2020.   On her previous visit the  left SFA stent has occluded.  Left: Stent and outflow artery occlusion with reconstitution in the popliteal artery. ABI's decreased from 0.6 to 0.5 on today's study.      She denise new symptoms of rest pain or non healing wounds.  The left foot is numb most of the time and has become more numb in the past 2 months.  She has intact motor and is ambulatory.   Her O2 demand 24 hours a day /COPD is what limits her lifestyle.    Past Medical History:  Diagnosis Date   Alcohol abuse    Anemia    Cancer ([place])    breast right   Chronic hyponatremia    COPD (chronic obstructive pulmonary disease) (HCC)    DCIS (ductal carcinoma in situ)    Dyspnea    Emphysema of lung (HCC)    Osteoporosis    Overactive bladder    PVD (peripheral vascular disease) ([place])    Tobacco use     Past Surgical History:  Procedure Laterality Date   ABDOMINAL AORTOGRAM W/LOWER EXTREMITY N/A 03/15/2019   Procedure: ABDOMINAL AORTOGRAM W/LOWER EXTREMITY;  Surgeon: Serafina Mitchell, MD;  Location: [place] [facility];  Service: Cardiovascular;  Laterality: N/A;   aeptoplasty     BREAST SURGERY      BRONCHIAL BRUSHINGS  02/12/2021   Procedure: BRONCHIAL BRUSHINGS;  Surgeon: Garner Nash, DO;  Location: [place] [facility];  Service: Pulmonary;;   BRONCHIAL NEEDLE ASPIRATION BIOPSY  02/12/2021   Procedure: BRONCHIAL NEEDLE ASPIRATION BIOPSIES;  Surgeon: Garner Nash, DO;  Location: [place] [facility];  Service: Pulmonary;;   cartaract extraction     EYE SURGERY Bilateral    Cataract in R & L eye and Retina surgery after cataract surgery in L eye   FIDUCIAL MARKER PLACEMENT  02/12/2021   Procedure: FIDUCIAL MARKER PLACEMENT;  Surgeon: Garner Nash, DO;  Location: [place] [facility];  Service: Pulmonary;;   INTRAMEDULLARY (IM) NAIL INTERTROCHANTERIC Left 01/30/2019   Procedure: INTRAMEDULLARY (IM) NAIL INTERTROCHANTRIC;  Surgeon: Frederik Pear, MD;  Location: [place];  Service: Orthopedics;  Laterality: Left;   IR FIBRIN GLUE REPAIR ANAL FISTULA     MASTECTOMY     s/p bl   PERIPHERAL VASCULAR INTERVENTION  03/15/2019   Procedure: PERIPHERAL VASCULAR INTERVENTION;  Surgeon: Serafina Mitchell, MD;  Location: [place] [facility];  Service: Cardiovascular;;  Lt. SFA   PERIPHERAL VASCULAR THROMBECTOMY  03/15/2019   Procedure: PERIPHERAL VASCULAR THROMBECTOMY;  Surgeon: Serafina Mitchell, MD;  Location: [place] [facility];  Service: Cardiovascular;;  Lt. AT, PT   RETINAL DETACHMENT SURGERY     sclerotherapy vein leg     VASCULAR SURGERY     VIDEO BRONCHOSCOPY  WITH ENDOBRONCHIAL NAVIGATION Bilateral 02/12/2021   Procedure: VIDEO BRONCHOSCOPY WITH ENDOBRONCHIAL NAVIGATION;  Surgeon: Garner Nash, DO;  Location: [place];  Service: Pulmonary;  Laterality: Bilateral;  ION   VIDEO BRONCHOSCOPY WITH RADIAL ENDOBRONCHIAL ULTRASOUND  02/12/2021   Procedure: RADIAL ENDOBRONCHIAL ULTRASOUND;  Surgeon: Garner Nash, DO;  Location: [place] [facility];  Service: Pulmonary;;   vitreous retinal surgery      ROS:   General:  No weight loss, Fever, chills  HEENT: No recent headaches, no nasal bleeding, no visual changes,  no sore throat  Neurologic: No dizziness, blackouts, seizures. No recent symptoms of stroke or mini- stroke. No recent episodes of slurred speech, or temporary blindness. Peripheral neuropathy  Cardiac: No recent episodes of chest pain/pressure, no shortness of breath at rest.  No shortness of breath with exertion.  Denies history of atrial fibrillation or irregular heartbeat  Vascular: No history of rest pain in feet.  No history of claudication.  No history of non-healing ulcer, No history of DVT   Pulmonary: positive home oxygen, no productive cough, no hemoptysis,  No asthma or wheezing  Musculoskeletal:  [ ]  Arthritis, [ ]  Low back pain,  [ ]  Joint pain  Hematologic:No history of hypercoagulable state.  No history of easy bleeding.  No history of anemia  Gastrointestinal: No hematochezia or melena,  No gastroesophageal reflux, no trouble swallowing  Urinary: [ ]  chronic Kidney disease, [ ]  on HD - [ ]  MWF or [ ]  TTHS, [ ]  Burning with urination, [ ]  Frequent urination, [ ]  Difficulty urinating;   Skin: No rashes  Psychological: No history of anxiety,  No history of depression  Social History Social History   Tobacco Use   Smoking status: Former    Packs/day: 1.50    Years: 50.00    Total pack years: 75.00    Types: Cigarettes    Start date: 89    Quit date: 10/06/2016    Years since quitting: 5.8   Smokeless tobacco: Never  Vaping Use   Vaping Use: Never used  Substance Use Topics   Alcohol use: Yes    Alcohol/week: 28.0 standard drinks of alcohol    Types: 28 Glasses of wine per week    Comment: 4 glasses wine a night   Drug use: Not Currently    Family History Family History  Problem Relation Age of Onset   Alzheimer's disease Mother    Pneumonia Father        aspiration pneumonia    Allergies  Allergies  Allergen Reactions   Duloxetine Hcl Other (See Comments)     Current Outpatient Medications  Medication Sig Dispense Refill   albuterol  (PROVENTIL) (2.5 MG/3ML) 0.083% nebulizer solution Take 3 mLs (2.5 mg total) by nebulization every 4 (four) hours as needed for wheezing or shortness of breath. 75 mL 3   albuterol (VENTOLIN HFA) 108 (90 Base) MCG/ACT inhaler USE 1 INHALATION EVERY 6 HOURS AS NEEDED FOR WHEEZING OR SHORTNESS OF BREATH 17 g 3   aspirin EC 81 MG tablet Take 81 mg by mouth daily.     atorvastatin (LIPITOR) 40 MG tablet      azelastine (ASTELIN) 0.1 % nasal spray USE 1 SPRAY IN EACH NOSTRIL TWICE A DAY 30 mL 6   Azelastine-Fluticasone 137-50 MCG/ACT SUSP Place 1 spray into the nose daily. 23 g 6   conjugated estrogens (PREMARIN) vaginal cream Place 1 Applicatorful vaginally every Monday, Wednesday, and Friday at 8 PM.  Cranberry 400 MG CAPS Take 1,200 mg by mouth daily at 2 PM.     denosumab (PROLIA) 60 MG/ML SOSY injection Inject 60 mg into the skin every 6 (six) months.      docusate sodium (COLACE) 100 MG capsule Take 1 capsule (100 mg total) by mouth 2 (two) times daily. (Patient taking differently: Take 100 mg by mouth at bedtime.) 10 capsule 0   Ferrous Fumarate (HEMOCYTE - 106 MG FE) 324 (106 Fe) MG TABS tablet Take 1 tablet by mouth daily at 3 pm.      fluticasone (FLONASE) 50 MCG/ACT nasal spray USE 2 SPRAYS IN EACH NOSTRIL TWICE A DAY 48 g 7   Fluticasone-Umeclidin-Vilant (TRELEGY ELLIPTA) 100-62.5-25 MCG/ACT AEPB Inhale 1 puff into the lungs daily at 6 (six) AM. 400 each 3   folic acid (FOLVITE) 1 MG tablet Take 1 mg by mouth daily.     gabapentin (NEURONTIN) 100 MG capsule TAKE 2 CAPSULES THREE TIMES A DAY 540 capsule 1   gabapentin (NEURONTIN) 300 MG capsule TAKE 2 CAPSULES THREE TIMES A DAY 540 capsule 1   guaiFENesin (MUCINEX) 600 MG 12 hr tablet Take 600 mg by mouth 2 (two) times daily.     hydrocortisone 2.5 % cream Apply 1 application topically daily.     Multiple Vitamin (MULTI-VITAMIN) tablet Take 1 tablet by mouth daily.      mupirocin ointment (BACTROBAN) 2 % Apply 1 application topically  daily as needed (wound care).      MYRBETRIQ 50 MG TB24 tablet Take 50 mg by mouth at bedtime.     nicotine polacrilex (COMMIT) 4 MG lozenge Take 4 mg by mouth as needed for smoking cessation.      OXYGEN Inhale 5 L into the lungs continuous.     pantoprazole (PROTONIX) 40 MG tablet Take 40 mg by mouth daily.     sodium chloride (OCEAN) 0.65 % SOLN nasal spray Place 1 spray into both nostrils 2 (two) times daily. As needed during the day     Vitamin D, Cholecalciferol, 25 MCG (1000 UT) TABS Take 1,000 Units by mouth daily.     No current facility-administered medications for this visit.    Physical Examination  Vitals:   08/18/22 1254  BP: (!) 114/59  Pulse: 97  Resp: 20  Temp: 98.6 F (37 C)  TempSrc: Temporal  SpO2: (!) 89%  Weight: 95 lb 8 oz (43.3 kg)  Height: 5\' 1"  (1.549 m)    Body mass index is 18.04 kg/m.  General:  Alert and oriented, no acute distress HEENT: Normal Neck: No bruit or JVD Pulmonary: Clear to auscultation bilaterally Cardiac: Regular Rate and Rhythm without murmur Abdomen: Soft, non-tender, non-distended, no mass, no scars Skin: No rash Extremity Pulses: feet are warm to touch and without ischemic changes  Musculoskeletal: No deformity or edema  Neurologic: Upper and lower extremity motor grossly intact and symmetric  DATA:  ABI Findings:  +---------+------------------+-----+--------+--------+  Right   Rt Pressure (mmHg)IndexWaveformComment   +---------+------------------+-----+--------+--------+  Brachial 129                                      +---------+------------------+-----+--------+--------+  PTA     138               1.07 biphasic          +---------+------------------+-----+--------+--------+  DP      132  1.02 biphasic          +---------+------------------+-----+--------+--------+  Great Toe99                0.77                   +---------+------------------+-----+--------+--------+    +--------+------------------+-----+----------+-------+  Left   Lt Pressure (mmHg)IndexWaveform  Comment  +--------+------------------+-----+----------+-------+  [id number]                                      +--------+------------------+-----+----------+-------+  PTA    64                0.50 monophasic         +--------+------------------+-----+----------+-------+  DP     54                0.42 monophasic         +--------+------------------+-----+----------+-------+   +-------+-----------+-----------+------------+------------+  ABI/TBIToday's ABIToday's TBIPrevious ABIPrevious TBI  +-------+-----------+-----------+------------+------------+  Right 1.07       0.77       1.03        0.57          +-------+-----------+-----------+------------+------------+  Left  0.50       absent     0.50        absent        +-------+-----------+-----------+------------+------------+         Bilateral ABIs appear essentially unchanged compared to prior study on  11/25/2021.    Summary:  Right: Resting right ankle-brachial index is within normal range. The  right toe-brachial index is normal.   Left: Resting left ankle-brachial index indicates moderate left lower  extremity arterial disease.    ASSESSMENT/PLAN:   Known PAD s/p multiple interventions at [facility] and most recent left SFA stent placement by DR. Brabham. She remains asymptomatic for claudication, rest pain or non healing wounds. She ambulates with a Rolator around her house. Her mobility is limited due to her pulmonary status. She is on O2 24/7.   Her ABI's are stable and unchanged from 6 months ago. As long as she remain asymptomatic for ischemia I suggested she continue with everyday activities.  If she develops symptoms she will call and we will schedule her for repeat angiogram with possible intervention.  Her and her husband agree with this plan. She will return for ABI's in 6 months.   Cont medical management with ASA and Statin daily.     Roxy Horseman PA-C [facility] Office: [phone]  MD in clinic [place]

## 2022-08-20 ENCOUNTER — Other Ambulatory Visit: Payer: Self-pay

## 2022-08-20 DIAGNOSIS — I779 Disorder of arteries and arterioles, unspecified: Secondary | ICD-10-CM

## 2022-08-20 DIAGNOSIS — I70213 Atherosclerosis of native arteries of extremities with intermittent claudication, bilateral legs: Secondary | ICD-10-CM

## 2022-08-26 ENCOUNTER — Encounter (HOSPITAL_COMMUNITY)
Admission: RE | Admit: 2022-08-26 | Discharge: 2022-08-26 | Disposition: A | Discharge status: Home / Self Care | Payer: Medicare Other | Source: Ambulatory Visit | Attending: Radiation Oncology | Admitting: Radiation Oncology

## 2022-08-26 DIAGNOSIS — J90 Pleural effusion, not elsewhere classified: Secondary | ICD-10-CM | POA: Insufficient documentation

## 2022-08-26 DIAGNOSIS — C50519 Malignant neoplasm of lower-outer quadrant of unspecified female breast: Secondary | ICD-10-CM | POA: Diagnosis not present

## 2022-08-26 DIAGNOSIS — R918 Other nonspecific abnormal finding of lung field: Secondary | ICD-10-CM | POA: Diagnosis not present

## 2022-08-26 DIAGNOSIS — J984 Other disorders of lung: Secondary | ICD-10-CM | POA: Diagnosis not present

## 2022-08-26 LAB — GLUCOSE, CAPILLARY: Glucose-Capillary: 92 mg/dL (ref 70–99)

## 2022-08-26 MED ORDER — FLUDEOXYGLUCOSE F - 18 (FDG) INJECTION
5.0100 | Freq: Once | INTRAVENOUS | Status: AC | PRN
  Administered 2022-08-26: 5.01 via INTRAVENOUS

## 2022-08-28 ENCOUNTER — Ambulatory Visit (INDEPENDENT_AMBULATORY_CARE_PROVIDER_SITE_OTHER): Payer: Medicare Other | Admitting: Adult Health

## 2022-08-28 ENCOUNTER — Telehealth: Payer: Self-pay | Admitting: Radiation Oncology

## 2022-08-28 ENCOUNTER — Encounter: Payer: Self-pay | Admitting: Adult Health

## 2022-08-28 VITALS — BP 120/56 | HR 103 | Temp 98.3°F | Ht 62.0 in | Wt 94.4 lb

## 2022-08-28 DIAGNOSIS — C3490 Malignant neoplasm of unspecified part of unspecified bronchus or lung: Secondary | ICD-10-CM

## 2022-08-28 DIAGNOSIS — J441 Chronic obstructive pulmonary disease with (acute) exacerbation: Secondary | ICD-10-CM | POA: Diagnosis not present

## 2022-08-28 DIAGNOSIS — R053 Chronic cough: Secondary | ICD-10-CM

## 2022-08-28 DIAGNOSIS — I779 Disorder of arteries and arterioles, unspecified: Secondary | ICD-10-CM | POA: Diagnosis not present

## 2022-08-28 DIAGNOSIS — J449 Chronic obstructive pulmonary disease, unspecified: Secondary | ICD-10-CM

## 2022-08-28 MED ORDER — PREDNISONE 10 MG PO TABS: ORAL_TABLET | ORAL | 0 refills | Status: DC

## 2022-08-28 NOTE — Assessment & Plan Note (Signed)
Recurrent COPD exacerbation with ongoing cough congestion.  Patient is on maximum therapy with Trelegy, nebulizers, flutter valve.  CT chest was negative for PE.  2D echo showed preserved EF, grade 1 diastolic dysfunction and pulmonary hypertension most likely secondary to chronic severe emphysema and respiratory failure. Hold on additional antibiotics this time.  Check sputum culture and sputum AFB. Short course of steroids.  Plan  Patient Instructions  Sputum culture and Sputum AFB today .  Prednisone 20mg  daily for 5 days then 10mg  daily for 5 days, take with food.  Mucinex DM Twice daily  As needed  congestion.  Use Flutter valve Three times a day   Albuterol inhaler or neb As needed   Continue on Oxygen  5l/m to keep sats >88-90%.  High protein diet  Continue on TRELEGY 1 puff daily, rinse after use.  Activity as tolerated  Follow up with Dr. [name] or Iver Miklas NP in 6 weeks and As needed   Please contact office for sooner follow up if symptoms do not improve or worsen or seek emergency care

## 2022-08-28 NOTE — Assessment & Plan Note (Signed)
Hypermetabolic lung nodules on PET scan.  Initially had a concerning left lower lobe lung nodule underwent SBRT March 2023.  Serial CT scan new bilateral lung nodules.  PET scan that was completed on February 2024 that showed a 10 mm hypermetabolic right upper lobe pulmonary nodule.  Persistent left upper lobe infiltrate and right upper lobe infiltrate with expected areas of hypermetabolism but no definite discrete lesion and a low level uptake in the left lower lobe nodule.  6 mm spiculated left upper lobe nodule and a right upper lobe and right lower lobe subpleural nodules are stable and not hypermetabolic.  No findings suspicious for mediastinal or hilar metastatic adenopathy.  And no findings in the abdominal or pelvis.  Stable severe emphysematous changes.-Patient is following with radiation oncology and will have SBRT to the right lower lobe lung nodule.

## 2022-08-28 NOTE — Patient Instructions (Addendum)
Sputum culture and Sputum AFB today .  Prednisone 20mg  daily for 5 days then 10mg  daily for 5 days, take with food.  Mucinex DM Twice daily  As needed  congestion.  Use Flutter valve Three times a day   Albuterol inhaler or neb As needed   Continue on Oxygen  5l/m to keep sats >88-90%.  High protein diet  Continue on TRELEGY 1 puff daily, rinse after use.  Activity as tolerated  Follow up with Dr. [name] or Maritssa Haughton NP in 6 weeks and As needed   Please contact office for sooner follow up if symptoms do not improve or worsen or seek emergency care

## 2022-08-28 NOTE — Progress Notes (Signed)
@Patient  ID: Beth Daniels, female    DOB: Mar 28, 1946, 77 y.o.   MRN: [id number]  Chief Complaint  Patient presents with   Follow-up    Referring provider: Shon Baton, MD  HPI: 77 year old female former smoker followed for severe emphysema, lung nodules and chronic respiratory failure on oxygen Presumed lung cancer with concerning left lower lobe nodule status post SBRT March 2023 Medical history significant for breast cancer  TEST/EVENTS :  CT chest May 14, 2022 biapical pleural-parenchymal scarring, emphysema, stable 8 mm right lower lobe nodule, 9 mm right upper lobe nodule stable, new patchy consolidation and groundglass in the inferior lingula obscuring previous 10 mm nodule measured on Nov 08, 2021, standard similar 9 mm left lower lobe nodule, 3 mm left lower lobe nodule no new nodules., Mildly enlarged heart and left ventricle. No pericardial effusion noted.    2D echo June 12, 2022 showed EF at 60 to 94%, grade 1 diastolic dysfunction, moderately elevated pulmonary artery systolic pressure, 85.4 mmHg.  PFTs May 2018 showed FEV1 at 58%, ratio 77, FVC 91%, DLCO 30%.  08/28/2022 Follow up: COPD/Emphysema , presumed lung Cancer with concerning left lower lobe lung nodule status post SBRT March 2023 Patient returns for repeat month follow-up.  Over the last 3 months patient has had increased shortness of breath.  Initially seen in November with increased shortness of breath with activity.  Some associated increased cough and congestion.  She was treated for COPD exacerbation in November and again in December.  She was set up for a 2D echo that was done on June 12, 2022 that showed preserved EF, mild diastolic dysfunction.  And moderate pulmonary hypertension with pulmonary artery pressures elevated at 48 mmHg.  Patient returned back to the office last month with ongoing symptoms of cough congestion and shortness of breath.  She says symptoms will get better for short period time and  then start to progressively worsen.  D-dimer was positive.  Patient was set up for a CT chest angio.  This was done on July 18, 2022 and was negative for PE.  No suspicious adenopathy.  Showed right lower lobe nodule measuring 9 x 10 mm, new right lower lobe nodule measuring 8 mm, stable right lower lobe 8 mm nodule, similar left nodule.  And new small left pleural effusion.  No osseous lesions noted.  Patient was given a 7-day course of Augmentin for suspected superimposed infectious source.  With recommendations for close follow-up in the office in 3 weeks with chest x-ray.  Patient discussed CT with radiation oncology and set up for a PET scan that was completed on February 2024 that showed a 10 mm hypermetabolic right upper lobe pulmonary nodule.  Persistent left upper lobe infiltrate and right upper lobe infiltrate with expected areas of hypermetabolism but no definite discrete lesion and a low level uptake in the left lower lobe nodule.  6 mm spiculated left upper lobe nodule and a right upper lobe and right lower lobe subpleural nodules are stable and not hypermetabolic.  No findings suspicious for mediastinal or hilar metastatic adenopathy.  And no findings in the abdominal or pelvis.  Stable severe emphysematous changes. Patient has been set up for definitive SBRT to the right lower lobe nodule.  Patient has an appointment in 1 week with radiation oncology. Patient says she continues to be short of breath with activities.  We went over her scans in detail.  She says she does have congestion that waxes and wanes and  sometimes can be thick and discolored.  As above she has completed 3 courses of antibiotics including doxycycline, azithromycin and Augmentin.  She says she did feel better for short period of time on prednisone.  She remains on Trelegy inhaler daily.  She does use her nebulizer as needed She remains on oxygen at 5 L.  And she is using her flutter valve a couple times a day.  She denies any  significant hemoptysis, fever, chest pain, orthopnea or increased edema.  Patient is accompanied by family members today.  Allergies  Allergen Reactions   Duloxetine Hcl Other (See Comments)    Immunization History  Administered Date(s) Administered   Fluad Quad(high Dose 65+) 05/15/2020, 04/21/2022   Influenza, High Dose Seasonal PF 05/04/2013, 05/04/2015, 06/04/2016, 05/15/2017, 04/29/2018, 04/04/2019   Influenza,inj,quad, With Preservative 04/06/2018   Influenza-Unspecified 07/15/2012, 05/04/2013, 05/03/2014, 05/04/2015, 06/04/2016, 05/15/2017   PFIZER(Purple Top)SARS-COV-2 Vaccination 08/12/2019, 09/06/2019, 03/02/2020   Pneumococcal Conjugate-13 06/20/2015   Pneumococcal Polysaccharide-23 08/14/2011   Zoster Recombinat (Shingrix) 04/30/2018, 07/31/2018   Zoster, Live 12/07/2012    Past Medical History:  Diagnosis Date   Alcohol abuse    Anemia    Cancer ([place])    breast right   Chronic hyponatremia    COPD (chronic obstructive pulmonary disease) (HCC)    DCIS (ductal carcinoma in situ)    Dyspnea    Emphysema of lung (HCC)    Osteoporosis    Overactive bladder    PVD (peripheral vascular disease) ([place])    Tobacco use     Tobacco History: Social History   Tobacco Use  Smoking Status Former   Packs/day: 1.50   Years: 50.00   Total pack years: 75.00   Types: Cigarettes   Start date: 65   Quit date: 10/06/2016   Years since quitting: 5.8  Smokeless Tobacco Never   Counseling given: Not Answered   Outpatient Medications Prior to Visit  Medication Sig Dispense Refill   albuterol (PROVENTIL) (2.5 MG/3ML) 0.083% nebulizer solution Take 3 mLs (2.5 mg total) by nebulization every 4 (four) hours as needed for wheezing or shortness of breath. 75 mL 3   albuterol (VENTOLIN HFA) 108 (90 Base) MCG/ACT inhaler USE 1 INHALATION EVERY 6 HOURS AS NEEDED FOR WHEEZING OR SHORTNESS OF BREATH 17 g 3   aspirin EC 81 MG tablet Take 81 mg by mouth daily.     atorvastatin  (LIPITOR) 40 MG tablet      azelastine (ASTELIN) 0.1 % nasal spray USE 1 SPRAY IN EACH NOSTRIL TWICE A DAY 30 mL 6   Azelastine-Fluticasone 137-50 MCG/ACT SUSP Place 1 spray into the nose daily. 23 g 6   conjugated estrogens (PREMARIN) vaginal cream Place 1 Applicatorful vaginally every Monday, Wednesday, and Friday at 8 PM.     Cranberry 400 MG CAPS Take 1,200 mg by mouth daily at 2 PM.     denosumab (PROLIA) 60 MG/ML SOSY injection Inject 60 mg into the skin every 6 (six) months.      docusate sodium (COLACE) 100 MG capsule Take 1 capsule (100 mg total) by mouth 2 (two) times daily. (Patient taking differently: Take 100 mg by mouth at bedtime.) 10 capsule 0   Ferrous Fumarate (HEMOCYTE - 106 MG FE) 324 (106 Fe) MG TABS tablet Take 1 tablet by mouth daily at 3 pm.      fluticasone (FLONASE) 50 MCG/ACT nasal spray USE 2 SPRAYS IN EACH NOSTRIL TWICE A DAY 48 g 7   Fluticasone-Umeclidin-Vilant (TRELEGY ELLIPTA) 100-62.5-25 MCG/ACT AEPB  Inhale 1 puff into the lungs daily at 6 (six) AM. 349 each 3   folic acid (FOLVITE) 1 MG tablet Take 1 mg by mouth daily.     gabapentin (NEURONTIN) 100 MG capsule TAKE 2 CAPSULES THREE TIMES A DAY 540 capsule 1   gabapentin (NEURONTIN) 300 MG capsule TAKE 2 CAPSULES THREE TIMES A DAY 540 capsule 1   guaiFENesin (MUCINEX) 600 MG 12 hr tablet Take 600 mg by mouth 2 (two) times daily.     hydrocortisone 2.5 % cream Apply 1 application topically daily.     Multiple Vitamin (MULTI-VITAMIN) tablet Take 1 tablet by mouth daily.      mupirocin ointment (BACTROBAN) 2 % Apply 1 application topically daily as needed (wound care).      MYRBETRIQ 50 MG TB24 tablet Take 50 mg by mouth at bedtime.     nicotine polacrilex (COMMIT) 4 MG lozenge Take 4 mg by mouth as needed for smoking cessation.      OXYGEN Inhale 5 L into the lungs continuous.     pantoprazole (PROTONIX) 40 MG tablet Take 40 mg by mouth daily.     sodium chloride (OCEAN) 0.65 % SOLN nasal spray Place 1 spray into  both nostrils 2 (two) times daily. As needed during the day     Vitamin D, Cholecalciferol, 25 MCG (1000 UT) TABS Take 1,000 Units by mouth daily.     No facility-administered medications prior to visit.     Review of Systems:   Constitutional:   No  weight loss, night sweats,  Fevers, chills,  +fatigue, or  lassitude.  HEENT:   No headaches,  Difficulty swallowing,  Tooth/dental problems, or  Sore throat,                No sneezing, itching, ear ache, nasal congestion, post nasal drip,   CV:  No chest pain,  Orthopnea, PND, swelling in lower extremities, anasarca, dizziness, palpitations, syncope.   GI  No heartburn, indigestion, abdominal pain, nausea, vomiting, diarrhea, change in bowel habits, loss of appetite, bloody stools.   Resp:  No chest wall deformity  Skin: no rash or lesions.  GU: no dysuria, change in color of urine, no urgency or frequency.  No flank pain, no hematuria   MS:  No joint pain or swelling.  No decreased range of motion.  No back pain.    Physical Exam  BP (!) 120/56 (BP Location: Left Arm, Patient Position: Sitting, Cuff Size: Normal)   Pulse (!) 103   Temp 98.3 F (36.8 C) (Oral)   Ht 5\' 2"  (1.575 m)   Wt 94 lb 6.4 oz (42.8 kg)   SpO2 91%   BMI 17.27 kg/m   GEN: A/Ox3; pleasant , NAD, thin, frail, elderly   HEENT:  [place]/AT,   NOSE-clear, THROAT-clear, no lesions, no postnasal drip or exudate noted.   NECK:  Supple w/ fair ROM; no JVD; normal carotid impulses w/o bruits; no thyromegaly or nodules palpated; no lymphadenopathy.    RESP few trace rhonchi bilaterally  no accessory muscle use, no dullness to percussion  CARD:  RRR, no m/r/g, tr  peripheral edema, pulses intact, no cyanosis or clubbing.  GI:   Soft & nt; nml bowel sounds; no organomegaly or masses detected.   Musco: Warm bil, no deformities or joint swelling noted.   Neuro: alert, no focal deficits noted.    Skin: Warm, no lesions or rashes    Lab  Results:  CBC  Imaging: NM PET Image Restag (PS) Skull Base To Thigh  Result Date: 08/27/2022 CLINICAL DATA:  Subsequent treatment strategy for non-small cell lung cancer. Patient also has a history of breast cancer EXAM: NUCLEAR MEDICINE PET SKULL BASE TO THIGH TECHNIQUE: 5.01 mCi F-18 FDG was injected intravenously. Full-ring PET imaging was performed from the skull base to thigh after the radiotracer. CT data was obtained and used for attenuation correction and anatomic localization. Fasting blood glucose: 107 mg/dl COMPARISON:  Multiple prior imaging studies. The most recent chest CT is 07/18/2022. Prior PET-CT 02/04/2021 FINDINGS: Mediastinal blood pool activity: SUV max 1.99 Liver activity: SUV max NA NECK: No hypermetabolic lymph nodes in the neck. Incidental CT findings: Bilateral carotid artery calcifications. CHEST: Multiple hypermetabolic pulmonary lesions. Spiculated 10 mm right lower lobe nodule adjacent to the major fissure is tenting the fissure. This has an SUV max of 3.90. 6 mm spiculated nodule in the left upper lobe adjacent to the heart border demonstrates low level uptake with SUV max of 2.22. This is somewhat worrisome. 1 cm spiculated density in the left lower lobe has an SUV max of 1.95. Nodular density in the left upper lobe posteriorly appears to be part of dense airspace consolidation in the anterior aspect of the left upper lobe. This has an SUV max of 1.65. New fairly extensive airspace consolidation and interstitial nodularity in the right upper lobe anteriorly. SUV max is 4.40. This is almost certainly an infectious process given the fact was not present on the prior study. Smoothly marginated well circumscribed anteromedial right upper lobe nodule measures 8 mm but does not show any hypermetabolism. No enlarged or hypermetabolic mediastinal or hilar lymph nodes. No breast masses, supraclavicular or axillary adenopathy. Incidental CT findings: Advanced emphysematous  changes and extensive pulmonary scarring. Advanced vascular disease again noted. Persistent left pleural effusion. ABDOMEN/PELVIS: Mild diffuse uptake in both adrenal glands without definite lesions on the CT scan. This is unchanged since the prior PET-CT and is likely physiologic. Incidental CT findings: Stable advanced vascular disease. SKELETON: No findings for osseous metastatic disease. Incidental CT findings: None. IMPRESSION: 1. The 10 mm spiculated superior segment right upper lobe pulmonary nodule is hypermetabolic and is tenting the major fissure and worrisome for neoplasm. Recommend biopsy, if possible. 2. Persistent left upper lobe infiltrate and new right upper lobe infiltrate with expected areas of hypermetabolism but no definite discrete lesion. 3. Low level uptake in the left lower lobe nodular density is indeterminate but is somewhat worrisome. 4. 6 mm spiculated nodule in the left upper lobe near the heart has low level FDG uptake but is worrisome. 5. Smoothly marginated right upper lobe and right lower lobe subpleural nodules remain stable and are not hypermetabolic. 6. No findings for metastatic mediastinal/hilar adenopathy. 7. No findings for abdominal/pelvic metastatic disease or osseous metastatic disease. 8. Stable severe emphysematous changes, pulmonary scarring and left pleural effusion. Electronically Signed   By: Marijo Sanes M.D.   On: 08/27/2022 10:48   VAS [place] ABI WITH/WO TBI  Result Date: 08/18/2022  LOWER EXTREMITY DOPPLER STUDY Patient Name:  NAIMAH YINGST  Date of Exam:   08/18/2022 Medical Rec #: [id number]  Accession #:    [id number] Date of Birth: [birth year]   Patient Gender: F Patient Age:   11 years Exam Location:  [facility] Procedure:      VAS [place] ABI WITH/WO TBI Referring Phys: --------------------------------------------------------------------------------  Indications: Claudication, and peripheral artery disease. Patient reports one  month of  left leg feeling of falling asleep.              Known occlusion of the left SFA stent  Vascular Interventions: History of stent left leg, outside facility (10/24/2016),                         Drug coated balloon angioplasty 09/2017 & 11 / 2019. Performing Technologist: Ronal Fear RVS, RCS  Examination Guidelines: A complete evaluation includes at minimum, Doppler waveform signals and systolic blood pressure reading at the level of bilateral brachial, anterior tibial, and posterior tibial arteries, when vessel segments are accessible. Bilateral testing is considered an integral part of a complete examination. Photoelectric Plethysmograph (PPG) waveforms and toe systolic pressure readings are included as required and additional duplex testing as needed. Limited examinations for reoccurring indications may be performed as noted.  ABI Findings: +---------+------------------+-----+--------+--------+ Right    Rt Pressure (mmHg)IndexWaveformComment  +---------+------------------+-----+--------+--------+ Brachial 129                                     +---------+------------------+-----+--------+--------+ PTA      138               1.07 biphasic         +---------+------------------+-----+--------+--------+ DP       132               1.02 biphasic         +---------+------------------+-----+--------+--------+ Great Toe99                0.77                  +---------+------------------+-----+--------+--------+ +--------+------------------+-----+----------+-------+ Left    Lt Pressure (mmHg)IndexWaveform  Comment +--------+------------------+-----+----------+-------+ [id number]                                      +--------+------------------+-----+----------+-------+ PTA     64                0.50 monophasic        +--------+------------------+-----+----------+-------+ DP      54                0.42 monophasic         +--------+------------------+-----+----------+-------+ +-------+-----------+-----------+------------+------------+ ABI/TBIToday's ABIToday's TBIPrevious ABIPrevious TBI +-------+-----------+-----------+------------+------------+ Right  1.07       0.77       1.03        0.57         +-------+-----------+-----------+------------+------------+ Left   0.50       absent     0.50        absent       +-------+-----------+-----------+------------+------------+  Bilateral ABIs appear essentially unchanged compared to prior study on 11/25/2021.  Summary: Right: Resting right ankle-brachial index is within normal range. The right toe-brachial index is normal. Left: Resting left ankle-brachial index indicates moderate left lower extremity arterial disease. *See table(s) above for measurements and observations.  Electronically signed by Harold Barban MD on 08/18/2022 at 2:07:07 PM.    Final           No data to display          No results found for: "NITRICOXIDE"      Assessment & Plan:   Lung cancer ([place]) Hypermetabolic lung nodules  on PET scan.  Initially had a concerning left lower lobe lung nodule underwent SBRT March 2023.  Serial CT scan new bilateral lung nodules.  PET scan that was completed on February 2024 that showed a 10 mm hypermetabolic right upper lobe pulmonary nodule.  Persistent left upper lobe infiltrate and right upper lobe infiltrate with expected areas of hypermetabolism but no definite discrete lesion and a low level uptake in the left lower lobe nodule.  6 mm spiculated left upper lobe nodule and a right upper lobe and right lower lobe subpleural nodules are stable and not hypermetabolic.  No findings suspicious for mediastinal or hilar metastatic adenopathy.  And no findings in the abdominal or pelvis.  Stable severe emphysematous changes.-Patient is following with radiation oncology and will have SBRT to the right lower lobe lung nodule.   COPD with acute  exacerbation (HCC) Recurrent COPD exacerbation with ongoing cough congestion.  Patient is on maximum therapy with Trelegy, nebulizers, flutter valve.  CT chest was negative for PE.  2D echo showed preserved EF, grade 1 diastolic dysfunction and pulmonary hypertension most likely secondary to chronic severe emphysema and respiratory failure. Hold on additional antibiotics this time.  Check sputum culture and sputum AFB. Short course of steroids.  Plan  Patient Instructions  Sputum culture and Sputum AFB today .  Prednisone 20mg  daily for 5 days then 10mg  daily for 5 days, take with food.  Mucinex DM Twice daily  As needed  congestion.  Use Flutter valve Three times a day   Albuterol inhaler or neb As needed   Continue on Oxygen  5l/m to keep sats >88-90%.  High protein diet  Continue on TRELEGY 1 puff daily, rinse after use.  Activity as tolerated  Follow up with Dr. [name] or Vonita Calloway NP in 6 weeks and As needed   Please contact office for sooner follow up if symptoms do not improve or worsen or seek emergency care      I spent   45 minutes dedicated to the care of this patient on the date of this encounter to include pre-visit review of records, face-to-face time with the patient discussing conditions above, post visit ordering of testing, clinical documentation with the electronic health record, making appropriate referrals as documented, and communicating necessary findings to members of the patients care team.   Rexene Edison, NP 08/28/2022

## 2022-08-28 NOTE — Telephone Encounter (Signed)
I called and spoke with the patient and she is still having shortness of breath despite 5L of O2 continuously. She had a hypermetabolic nodule in the RLL. Otherwise the findings of other nodules and activity are felt to be inflammatory. We discussed the lesion in the RLL and that Dr. Lisbeth Renshaw would recommend definitive SBRT to it like we've treated her in the past.  We discussed the risks, benefits, short, and long term effects of radiotherapy, as well as the curative intent, and the patient is interested in proceeding with plans for 3-5 fractions. The patient will be contacted to coordinate treatment planning by our simulation department. She will follow up with pulmonary medicine today and has been on two previous courses of antibiotics but may need additional treatment.

## 2022-09-01 ENCOUNTER — Other Ambulatory Visit: Payer: Medicare Other

## 2022-09-01 DIAGNOSIS — R053 Chronic cough: Secondary | ICD-10-CM

## 2022-09-01 DIAGNOSIS — J449 Chronic obstructive pulmonary disease, unspecified: Secondary | ICD-10-CM

## 2022-09-02 MED ORDER — ALBUTEROL SULFATE (2.5 MG/3ML) 0.083% IN NEBU: 2.5000 mg | INHALATION_SOLUTION | RESPIRATORY_TRACT | 3 refills | Status: DC | PRN

## 2022-09-02 NOTE — Telephone Encounter (Signed)
Mychart message sent by pt:  Bo Mcclintock  P [facility] Pool (supporting [name] S Parrett, NP)25 minutes ago (2:28 PM)    [name], I tried again this AM to get requested samples, did not take my morning meds with water, and did not drink any coffee, but 2 hrs. after trying, and not being able to cough up samples, I gave up.  Took my meds and drank my coffee and by the time I got to my desk. I was coughing up phlegm.  I am willing to try again tomorrow, and wanted ask if I can use my Trelegey Inhaler, my nose drops and or Nebulizer before attempting to get samples?   Second Item, need a new RX for Abuterol Sulfate Inhalation So;ution 0.083%  2.3 mg/66m*  Please call into [facility].    Thank you, APami Favret DOB [phone]   Routing to [name] for review.

## 2022-09-02 NOTE — Telephone Encounter (Signed)
Thank her for trying so hard continue to try it again tomorrow and is fine to go ahead and use her medicines beforehand maybe that will help her to get up a sample more efficiently.  Yes you can refill her albuterol nebulizer solution

## 2022-09-03 ENCOUNTER — Other Ambulatory Visit: Payer: Medicare Other

## 2022-09-03 DIAGNOSIS — J449 Chronic obstructive pulmonary disease, unspecified: Secondary | ICD-10-CM

## 2022-09-03 DIAGNOSIS — R053 Chronic cough: Secondary | ICD-10-CM

## 2022-09-05 ENCOUNTER — Ambulatory Visit
Admission: RE | Admit: 2022-09-05 | Discharge: 2022-09-05 | Disposition: A | Discharge status: Home / Self Care | Payer: Medicare Other | Source: Ambulatory Visit | Attending: Radiation Oncology | Admitting: Radiation Oncology

## 2022-09-05 ENCOUNTER — Other Ambulatory Visit: Payer: Self-pay

## 2022-09-05 DIAGNOSIS — C3431 Malignant neoplasm of lower lobe, right bronchus or lung: Secondary | ICD-10-CM | POA: Diagnosis present

## 2022-09-06 LAB — RESPIRATORY CULTURE OR RESPIRATORY AND SPUTUM CULTURE
MICRO NUMBER:: 14626406
RESULT:: NORMAL
SPECIMEN QUALITY:: ADEQUATE

## 2022-09-08 NOTE — Telephone Encounter (Signed)
Mychart message sent by pt:  Bo Mcclintock  [facility] (supporting [name] S Parrett, NP)48 minutes ago (1:49 PM)    [name], I reviewed the test results, but was unsure of they mean.  Can you expand a bit on these results?     Also, Simulation went well on 3/1 with 3 SBRT scheduled for 3/14, 3/18 & 3/20.      Routing to [name] for review.

## 2022-09-09 NOTE — Telephone Encounter (Signed)
See result note :  Ms Murchison , Sputum culture is okay , it does not shows any specific germ or organism that needs additional antibiotics. It shows normal organisms that we see in the pulmonary system. I hope radiation treatments are going okay. Please follow up as planned and follow office visit recommendations . Please contact office for sooner follow up if symptoms do not improve or worsen or seek emergency care

## 2022-09-11 NOTE — Progress Notes (Signed)
Called and spoke with patient, advised of the results/recommendations per Rexene Edison NP.  She verbalized understanding.  Nothing further needed.

## 2022-09-16 DIAGNOSIS — C3431 Malignant neoplasm of lower lobe, right bronchus or lung: Secondary | ICD-10-CM | POA: Diagnosis not present

## 2022-09-17 ENCOUNTER — Other Ambulatory Visit: Payer: Self-pay | Admitting: Pulmonary Disease

## 2022-09-18 ENCOUNTER — Other Ambulatory Visit: Payer: Self-pay

## 2022-09-18 ENCOUNTER — Ambulatory Visit
Admission: RE | Admit: 2022-09-18 | Discharge: 2022-09-18 | Disposition: A | Discharge status: Home / Self Care | Payer: Medicare Other | Source: Ambulatory Visit | Attending: Radiation Oncology | Admitting: Radiation Oncology

## 2022-09-18 DIAGNOSIS — C3431 Malignant neoplasm of lower lobe, right bronchus or lung: Secondary | ICD-10-CM | POA: Diagnosis not present

## 2022-09-18 LAB — RAD ONC ARIA SESSION SUMMARY
Course Elapsed Days: 0
Plan Fractions Treated to Date: 1
Plan Prescribed Dose Per Fraction: 18 Gy
Plan Total Fractions Prescribed: 3
Plan Total Prescribed Dose: 54 Gy
Reference Point Dosage Given to Date: 18 Gy
Reference Point Session Dosage Given: 18 Gy
Session Number: 1

## 2022-09-19 ENCOUNTER — Ambulatory Visit: Payer: Medicare Other | Admitting: Radiation Oncology

## 2022-09-22 ENCOUNTER — Ambulatory Visit
Admission: RE | Admit: 2022-09-22 | Discharge: 2022-09-22 | Disposition: A | Discharge status: Home / Self Care | Payer: Medicare Other | Source: Ambulatory Visit | Attending: Radiation Oncology | Admitting: Radiation Oncology

## 2022-09-22 ENCOUNTER — Other Ambulatory Visit: Payer: Self-pay

## 2022-09-22 DIAGNOSIS — C3431 Malignant neoplasm of lower lobe, right bronchus or lung: Secondary | ICD-10-CM | POA: Diagnosis not present

## 2022-09-22 LAB — RAD ONC ARIA SESSION SUMMARY
Course Elapsed Days: 4
Plan Fractions Treated to Date: 2
Plan Prescribed Dose Per Fraction: 18 Gy
Plan Total Fractions Prescribed: 3
Plan Total Prescribed Dose: 54 Gy
Reference Point Dosage Given to Date: 36 Gy
Reference Point Session Dosage Given: 18 Gy
Session Number: 2

## 2022-09-24 ENCOUNTER — Ambulatory Visit
Admission: RE | Admit: 2022-09-24 | Discharge: 2022-09-24 | Disposition: A | Discharge status: Home / Self Care | Payer: Medicare Other | Source: Ambulatory Visit | Attending: Radiation Oncology | Admitting: Radiation Oncology

## 2022-09-24 ENCOUNTER — Telehealth: Payer: Self-pay | Admitting: *Deleted

## 2022-09-24 ENCOUNTER — Other Ambulatory Visit: Payer: Self-pay

## 2022-09-24 DIAGNOSIS — C3431 Malignant neoplasm of lower lobe, right bronchus or lung: Secondary | ICD-10-CM

## 2022-09-24 DIAGNOSIS — C50519 Malignant neoplasm of lower-outer quadrant of unspecified female breast: Secondary | ICD-10-CM

## 2022-09-24 LAB — RAD ONC ARIA SESSION SUMMARY
Course Elapsed Days: 6
Plan Fractions Treated to Date: 3
Plan Prescribed Dose Per Fraction: 18 Gy
Plan Total Fractions Prescribed: 3
Plan Total Prescribed Dose: 54 Gy
Reference Point Dosage Given to Date: 54 Gy
Reference Point Session Dosage Given: 11.4305 Gy
Session Number: 3

## 2022-09-24 NOTE — Telephone Encounter (Signed)
CALLED PATIENT TO INFORM OF CT FOR 11-03-22- ARRIVAL TIME- 10:15 AM @ [facility], PATIENT TO HAVE WATER ONLY- 4 HRS. PRIOR TO TEST, BLOOD TO BE DRAWN I-STAT IN RADIOLOGY, INFORMED PATIENT THAT [facility] WILL CALL HER WITH THE RESULTS, SPOKE WITH PATIENT AND SHE IS AWARE OF THIS SCAN AND THE INSTRUCTIONS

## 2022-09-24 NOTE — Progress Notes (Signed)
  Radiation Oncology         [phone] ________________________________  Name: Beth Daniels MRN: [id number]  Date: 09/24/2022  DOB: 1945/08/03  End of Treatment Note  Diagnosis:   Putative Stage IA1, cT1aN0M0, NSCLC of the RLL  with History of  Putative Stage IA2, cT1bN0M0, NSCLC of the LLL along the level of the lingula   Indication for treatment:  Curative       Radiation treatment dates:  09/11/22 -09/24/22  Site/dose:   The tumor in the RLL was treated with a course of stereotactic body radiation treatment. The patient received 54 Gy in 3 fractions at 18 G per fraction.  Narrative: The patient tolerated radiation treatment relatively well.   The patient did not have any signs of acute toxicity during treatment.  Plan: The patient will receive a call in about one month from the radiation oncology department. We will plan a restaging CT in 6-8 weeks and will follow up with these results when available. She will also continue to follow up with pulmonary.      Carola Rhine, PAC

## 2022-09-26 ENCOUNTER — Ambulatory Visit: Payer: Medicare Other | Admitting: Radiation Oncology

## 2022-10-15 ENCOUNTER — Ambulatory Visit (INDEPENDENT_AMBULATORY_CARE_PROVIDER_SITE_OTHER): Payer: Medicare Other | Admitting: Pulmonary Disease

## 2022-10-15 ENCOUNTER — Encounter: Payer: Self-pay | Admitting: Pulmonary Disease

## 2022-10-15 VITALS — BP 132/68 | HR 104 | Ht 61.5 in | Wt 93.0 lb

## 2022-10-15 DIAGNOSIS — J449 Chronic obstructive pulmonary disease, unspecified: Secondary | ICD-10-CM | POA: Diagnosis not present

## 2022-10-15 NOTE — Patient Instructions (Signed)
I will see you back in about 3 months  Continue Trelegy Continue nebulization treatments  We will place a referral for pulmonary rehab  Exercise at home as tolerated  Graded challenges as tolerated  Call us with any significant concerns  You may call us in a few days if you feel the cough and sputum production is worsening as well

## 2022-10-15 NOTE — Progress Notes (Signed)
Ct                Beth Daniels    [id number]    04/11/1946  Primary Care Physician:Russo, Jonny RuizJohn, MD  Referring Physician: Creola Cornusso, John, MD [address] [place] [id number]  Chief complaint:   Patient in for follow-up for obstructive lung disease  HPI:  Recently completed treatment for presumed lung cancer right lower lobe nodule  History significant for breast cancer  Shortness of breath with most activities Only able to do about 30-40 steps  Recently treated for COPD exacerbation with a course of Augmentin  Found to have right upper lobe hypermetabolic lesion on PET scan  Breathing has been relatively stable, significantly limited Limited activity level secondary to multiple comorbidities She does have a cough and usually with early morning sputum production  [facility] which were reviewed  Chronically on oxygen supplementation  Recent CT findings noted, recent PET scan findings noted  She does have advanced chronic obstructive pulmonary disease, extensive emphysema  Tolerated rehab well for broken hip  She is using oxygen supplementation at about 3 L, mostly at night and more frequently following a recent fall  Has been complaining of some left lower thigh pain-being worked up  She does have peripheral arterial disease, significant peripheral neuropathy History of breast cancer-right breast in 2013  Compliant with nebulization use with albuterol and MDI use On Anoro long-term  Reformed smoker  Limited with activities  Outpatient Encounter Medications as of 10/15/2022  Medication Sig   albuterol (PROVENTIL) (2.5 MG/3ML) 0.083% nebulizer solution Take 3 mLs (2.5 mg total) by nebulization every 4 (four) hours as needed for wheezing or shortness of breath.   albuterol (VENTOLIN HFA) 108 (90 Base) MCG/ACT inhaler USE 1 INHALATION EVERY 6 HOURS AS NEEDED FOR WHEEZING OR SHORTNESS OF BREATH   aspirin EC 81 MG  tablet Take 81 mg by mouth daily.   atorvastatin (LIPITOR) 40 MG tablet    azelastine (ASTELIN) 0.1 % nasal spray USE 1 SPRAY IN EACH NOSTRIL TWICE A DAY   Azelastine-Fluticasone 137-50 MCG/ACT SUSP Place 1 spray into the nose daily.   conjugated estrogens (PREMARIN) vaginal cream Place 1 Applicatorful vaginally every Monday, Wednesday, and Friday at 8 PM.   Cranberry 400 MG CAPS Take 1,200 mg by mouth daily at 2 PM.   denosumab (PROLIA) 60 MG/ML SOSY injection Inject 60 mg into the skin every 6 (six) months.    docusate sodium (COLACE) 100 MG capsule Take 1 capsule (100 mg total) by mouth 2 (two) times daily. (Patient taking differently: Take 100 mg by mouth at bedtime.)   Ferrous Fumarate (HEMOCYTE - 106 MG FE) 324 (106 Fe) MG TABS tablet Take 1 tablet by mouth daily at 3 pm.    fluticasone (FLONASE) 50 MCG/ACT nasal spray USE 2 SPRAYS IN EACH NOSTRIL TWICE A DAY   Fluticasone-Umeclidin-Vilant (TRELEGY ELLIPTA) 100-62.5-25 MCG/ACT AEPB Inhale 1 puff into the lungs daily at 6 (six) AM.   folic acid (FOLVITE) 1 MG tablet Take 1 mg by mouth daily.   gabapentin (NEURONTIN) 100 MG capsule TAKE 2 CAPSULES THREE TIMES A DAY   gabapentin (NEURONTIN) 300 MG capsule TAKE 2 CAPSULES THREE TIMES A DAY   guaiFENesin (MUCINEX) 600 MG 12 hr tablet Take 600 mg by mouth 2 (two) times daily.   hydrocortisone 2.5 % cream Apply 1 application topically daily.   Multiple Vitamin (MULTI-VITAMIN) tablet Take 1 tablet by  mouth daily.    mupirocin ointment (BACTROBAN) 2 % Apply 1 application topically daily as needed (wound care).    MYRBETRIQ 50 MG TB24 tablet Take 50 mg by mouth at bedtime.   nicotine polacrilex (COMMIT) 4 MG lozenge Take 4 mg by mouth as needed for smoking cessation.    OXYGEN Inhale 5 L into the lungs continuous.   pantoprazole (PROTONIX) 40 MG tablet Take 40 mg by mouth daily.   sodium chloride (OCEAN) 0.65 % SOLN nasal spray Place 1 spray into both nostrils 2 (two) times daily. As needed during  the day   Vitamin D, Cholecalciferol, 25 MCG (1000 UT) TABS Take 1,000 Units by mouth daily.   No facility-administered encounter medications on file as of 10/15/2022.    Allergies as of 10/15/2022 - Review Complete 10/15/2022  Allergen Reaction Noted   Duloxetine hcl Other (See Comments) 08/18/2022    Past Medical History:  Diagnosis Date   Alcohol abuse    Anemia    Cancer    breast right   Chronic hyponatremia    COPD (chronic obstructive pulmonary disease)    DCIS (ductal carcinoma in situ)    Dyspnea    Emphysema of lung    Osteoporosis    Overactive bladder    PVD (peripheral vascular disease)    Tobacco use     Past Surgical History:  Procedure Laterality Date   ABDOMINAL AORTOGRAM W/LOWER EXTREMITY N/A 03/15/2019   Procedure: ABDOMINAL AORTOGRAM W/LOWER EXTREMITY;  Surgeon: Nada Libman, MD;  Location: [facility];  Service: Cardiovascular;  Laterality: N/A;   aeptoplasty     BREAST SURGERY     BRONCHIAL BRUSHINGS  02/12/2021   Procedure: BRONCHIAL BRUSHINGS;  Surgeon: Josephine Igo, DO;  Location: [facility];  Service: Pulmonary;;   BRONCHIAL NEEDLE ASPIRATION BIOPSY  02/12/2021   Procedure: BRONCHIAL NEEDLE ASPIRATION BIOPSIES;  Surgeon: Josephine Igo, DO;  Location: [facility];  Service: Pulmonary;;   cartaract extraction     EYE SURGERY Bilateral    Cataract in R & L eye and Retina surgery after cataract surgery in L eye   FIDUCIAL MARKER PLACEMENT  02/12/2021   Procedure: FIDUCIAL MARKER PLACEMENT;  Surgeon: Josephine Igo, DO;  Location: [facility];  Service: Pulmonary;;   INTRAMEDULLARY (IM) NAIL INTERTROCHANTERIC Left 01/30/2019   Procedure: INTRAMEDULLARY (IM) NAIL INTERTROCHANTRIC;  Surgeon: Gean Birchwood, MD;  Location: [facility];  Service: Orthopedics;  Laterality: Left;   IR FIBRIN GLUE REPAIR ANAL FISTULA     MASTECTOMY     s/p bl   PERIPHERAL VASCULAR INTERVENTION  03/15/2019   Procedure: PERIPHERAL VASCULAR INTERVENTION;  Surgeon:  Nada Libman, MD;  Location: [facility];  Service: Cardiovascular;;  Lt. SFA   PERIPHERAL VASCULAR THROMBECTOMY  03/15/2019   Procedure: PERIPHERAL VASCULAR THROMBECTOMY;  Surgeon: Nada Libman, MD;  Location: [facility];  Service: Cardiovascular;;  Lt. AT, PT   RETINAL DETACHMENT SURGERY     sclerotherapy vein leg     VASCULAR SURGERY     VIDEO BRONCHOSCOPY WITH ENDOBRONCHIAL NAVIGATION Bilateral 02/12/2021   Procedure: VIDEO BRONCHOSCOPY WITH ENDOBRONCHIAL NAVIGATION;  Surgeon: Josephine Igo, DO;  Location: [facility];  Service: Pulmonary;  Laterality: Bilateral;  ION   VIDEO BRONCHOSCOPY WITH RADIAL ENDOBRONCHIAL ULTRASOUND  02/12/2021   Procedure: RADIAL ENDOBRONCHIAL ULTRASOUND;  Surgeon: Josephine Igo, DO;  Location: [facility];  Service: Pulmonary;;   vitreous retinal surgery      Family History  Problem Relation Age of Onset   Alzheimer's disease Mother    Pneumonia Father        aspiration pneumonia    Social History   Socioeconomic History   Marital status: Married    Spouse name: Dorinda Hill   Number of children: Not on file   Years of education: Not on file   Highest education level: Not on file  Occupational History   Occupation: RETIRED  Tobacco Use   Smoking status: Former    Packs/day: 1.50    Years: 50.00    Additional pack years: 0.00    Total pack years: 75.00    Types: Cigarettes    Start date: 72    Quit date: 10/06/2016    Years since quitting: 6.0   Smokeless tobacco: Never  Vaping Use   Vaping Use: Never used  Substance and Sexual Activity   Alcohol use: Yes    Alcohol/week: 28.0 standard drinks of alcohol    Types: 28 Glasses of wine per week    Comment: 4 glasses wine a night   Drug use: Not Currently   Sexual activity: Not Currently  Other Topics Concern   Not on file  Social History Narrative   Right handed,    Lives with husband   Drinks 2 cups caffeine daily   Social Determinants of Health   Financial  Resource Strain: Not on file  Food Insecurity: Not on file  Transportation Needs: Not on file  Physical Activity: Not on file  Stress: Not on file  Social Connections: Not on file  Intimate Partner Violence: Not on file    Review of Systems  Constitutional:  Positive for fatigue.  HENT: Negative.    Respiratory:  Positive for shortness of breath.   Cardiovascular:  Negative for chest pain and leg swelling.  Gastrointestinal: Negative.   Endocrine: Negative.   Genitourinary: Negative.   All other systems reviewed and are negative.   Vitals:   10/15/22 1314  BP: 132/68  Pulse: (!) 104  SpO2: 97%     Physical Exam Constitutional:      Appearance: Normal appearance. She is well-developed.  HENT:     Head: Normocephalic and atraumatic.  Eyes:     General:        Right eye: No discharge.        Left eye: No discharge.     Pupils: Pupils are equal, round, and reactive to light.  Cardiovascular:     Rate and Rhythm: Normal rate and regular rhythm.  Pulmonary:     Effort: Pulmonary effort is normal. No respiratory distress.     Breath sounds: Normal breath sounds. No wheezing or rales.  Neurological:     Mental Status: She is alert.  Psychiatric:        Mood and Affect: Mood normal.    Data Reviewed: Records from [facility] reviewed CT scan from 11/07/2019 reviewed showing stable lung nodules CT scan reviewed today 08/03/2020  Could not perform PFT previously Assessment:  Chronic obstructive pulmonary disease -Does not appear to be exacerbated  Chronic hypoxemic respiratory failure  Panlobular emphysema  Significant debility  Recent fractured femur  Multiple lung nodules  Recently treated for lung cancer with stereotactic radiation -Continues to follow-up with radiation oncology  Plan/Recommendations:  Follow-up in 3 months  Continue Trelegy  Continue nebulization treatments  Exercise as tolerated  Consider referral to pulmonary rehab  Continues to  use about 5 L of oxygen with activity  Virl Diamond MD  [facility] 10/15/2022, 5:44 PM  CC: [name], MD

## 2022-10-28 LAB — AFB CULTURE WITH SMEAR (NOT AT ARMC)
Acid Fast Culture: NEGATIVE
Acid Fast Smear: NEGATIVE

## 2022-11-03 ENCOUNTER — Ambulatory Visit (HOSPITAL_COMMUNITY)
Admission: RE | Admit: 2022-11-03 | Discharge: 2022-11-03 | Disposition: A | Discharge status: Home / Self Care | Payer: Medicare Other | Source: Ambulatory Visit | Attending: Radiation Oncology | Admitting: Radiation Oncology

## 2022-11-03 ENCOUNTER — Encounter (HOSPITAL_COMMUNITY): Payer: Self-pay

## 2022-11-03 DIAGNOSIS — C3431 Malignant neoplasm of lower lobe, right bronchus or lung: Secondary | ICD-10-CM | POA: Insufficient documentation

## 2022-11-03 DIAGNOSIS — C50519 Malignant neoplasm of lower-outer quadrant of unspecified female breast: Secondary | ICD-10-CM

## 2022-11-03 LAB — POCT I-STAT CREATININE: Creatinine, Ser: 0.4 mg/dL — ABNORMAL LOW (ref 0.44–1.00)

## 2022-11-03 MED ORDER — IOHEXOL 300 MG/ML  SOLN
75.0000 mL | Freq: Once | INTRAMUSCULAR | Status: AC | PRN
  Administered 2022-11-03: 60 mL via INTRAVENOUS

## 2022-11-03 MED ORDER — SODIUM CHLORIDE (PF) 0.9 % IJ SOLN
INTRAMUSCULAR | Status: AC
  Filled 2022-11-03: qty 50

## 2022-11-05 ENCOUNTER — Telehealth: Payer: Self-pay | Admitting: Radiation Oncology

## 2022-11-05 DIAGNOSIS — C3431 Malignant neoplasm of lower lobe, right bronchus or lung: Secondary | ICD-10-CM

## 2022-11-05 DIAGNOSIS — C50519 Malignant neoplasm of lower-outer quadrant of unspecified female breast: Secondary | ICD-10-CM

## 2022-11-05 NOTE — Telephone Encounter (Signed)
I called and let the patient know I plan to review her scan with Dr. Mitzi Hansen next week, but that I suspect he will want another CT in 3-4 months. I'll order this and  make changes as needed and call her back after reviewing with Dr. Mitzi Hansen. The area in question is in the RUL, which previously has been felt to be inflammatory. She has a history of COPD and is O2 dependent at 5 Liters, [place]. She states this has remained stable.

## 2022-11-07 ENCOUNTER — Telehealth (HOSPITAL_COMMUNITY): Payer: Self-pay

## 2022-11-07 NOTE — Telephone Encounter (Signed)
Pt is not interested in the pulmonary rehab program. Closed referral. 

## 2022-11-10 ENCOUNTER — Ambulatory Visit
Admission: RE | Admit: 2022-11-10 | Discharge: 2022-11-10 | Disposition: A | Discharge status: Home / Self Care | Payer: Medicare Other | Source: Ambulatory Visit | Attending: Radiation Oncology | Admitting: Radiation Oncology

## 2022-11-10 DIAGNOSIS — C3431 Malignant neoplasm of lower lobe, right bronchus or lung: Secondary | ICD-10-CM | POA: Insufficient documentation

## 2022-11-10 NOTE — Progress Notes (Signed)
  Radiation Oncology         [phone] ________________________________  Name: KJERSTEN LICHTERMAN MRN: [id number]  Date of Service: 11/10/2022  DOB: September 04, 1945  Post Treatment Telephone Note  Diagnosis:  Putative Stage IA1, cT1aN0M0, NSCLC of the RLL  with History of  Putative Stage IA2, cT1bN0M0, NSCLC of the LLL along the level of the lingula    Indication for treatment:  Curative        Radiation treatment dates:  09/11/22 -09/24/22   Site/dose:   The tumor in the RLL was treated with a course of stereotactic body radiation treatment. The patient received 54 Gy in 3 fractions at 18 G per fraction.(as documented in provider EOT note)  The patient was available for call today.   Symptoms of fatigue have improved since completing therapy.  Symptoms of skin changes have improved since completing therapy.  Symptoms of esophagitis have improved since completing therapy.  The patient will follow up with pulmonary for ongoing care, and was encouraged to call if she develops concerns or questions regarding radiation.  This concludes the interview.   Ruel Favors, LPN

## 2022-11-18 ENCOUNTER — Encounter: Payer: Self-pay | Admitting: Radiation Oncology

## 2022-11-26 ENCOUNTER — Other Ambulatory Visit: Payer: Self-pay | Admitting: Pulmonary Disease

## 2022-12-03 ENCOUNTER — Other Ambulatory Visit: Payer: Self-pay | Admitting: Neurology

## 2022-12-03 ENCOUNTER — Other Ambulatory Visit: Payer: Self-pay | Admitting: Adult Health

## 2023-02-05 ENCOUNTER — Telehealth: Payer: Self-pay | Admitting: *Deleted

## 2023-02-05 NOTE — Telephone Encounter (Signed)
CALLED PATIENT TO INFORM OF CT FOR 02-11-23- ARRIVAL TIME- 5:15 PM @ [facility], NO RESTRICTIONS TO TEST, PATIENT TO BE CALLED WITH RESULTS ON 02-23-23 @ 3 PM, SPOKE WITH PATIENT AND SHE IS AWARE OF THESE APPTS. AND THE INSTRUCTIONS

## 2023-02-09 ENCOUNTER — Ambulatory Visit: Payer: Medicare Other | Admitting: Radiation Oncology

## 2023-02-11 ENCOUNTER — Ambulatory Visit (HOSPITAL_COMMUNITY)
Admission: RE | Admit: 2023-02-11 | Discharge: 2023-02-11 | Disposition: A | Discharge status: Home / Self Care | Payer: Medicare Other | Source: Ambulatory Visit | Attending: Radiation Oncology | Admitting: Radiation Oncology

## 2023-02-11 DIAGNOSIS — C50519 Malignant neoplasm of lower-outer quadrant of unspecified female breast: Secondary | ICD-10-CM | POA: Insufficient documentation

## 2023-02-11 DIAGNOSIS — C3431 Malignant neoplasm of lower lobe, right bronchus or lung: Secondary | ICD-10-CM | POA: Insufficient documentation

## 2023-02-11 MED ORDER — IOHEXOL 300 MG/ML  SOLN
75.0000 mL | Freq: Once | INTRAMUSCULAR | Status: AC | PRN
  Administered 2023-02-11: 75 mL via INTRAVENOUS

## 2023-02-16 ENCOUNTER — Ambulatory Visit: Payer: Medicare Other | Admitting: Radiation Oncology

## 2023-02-16 ENCOUNTER — Ambulatory Visit (HOSPITAL_COMMUNITY)
Admission: RE | Admit: 2023-02-16 | Discharge: 2023-02-16 | Disposition: A | Discharge status: Home / Self Care | Payer: Medicare Other | Source: Ambulatory Visit | Attending: Surgery | Admitting: Surgery

## 2023-02-16 ENCOUNTER — Ambulatory Visit: Payer: Medicare Other | Admitting: Physician Assistant

## 2023-02-16 VITALS — BP 164/71 | HR 89 | Temp 97.7°F | Resp 20 | Ht 62.0 in | Wt 94.9 lb

## 2023-02-16 DIAGNOSIS — I70213 Atherosclerosis of native arteries of extremities with intermittent claudication, bilateral legs: Secondary | ICD-10-CM | POA: Diagnosis present

## 2023-02-16 DIAGNOSIS — I779 Disorder of arteries and arterioles, unspecified: Secondary | ICD-10-CM | POA: Insufficient documentation

## 2023-02-16 LAB — VAS US ABI WITH/WO TBI
Left ABI: 0.52
Right ABI: 0.98

## 2023-02-16 NOTE — Progress Notes (Signed)
[facility] [place] HISTORY AND PHYSICAL   History of Present Illness:  Patient is a 77 y.o. year old female who presents for evaluation of left LE PAD.  She had previously undergone multiple percutaneous interventions at [facility].  She underwent left superficial femoral artery stenting April 2018.  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  She subsequently had drug-coated balloon angioplasty for in-stent restenosis in March and again in November 2019 with cutting Balloon angioplasty of the left SFA and balloon angioplasty of the right external iliac artery.  She then underwent drug-eluting stent placement of left SFA due to in-stent stenosis with mechanical thrombectomy of left ATA and PTA by Dr. Myra Gianotti in September 2020.   On her previous visit the  left SFA stent has occluded.  Left: Stent and outflow artery occlusion with reconstitution in the popliteal artery. ABI's decreased from 0.6 to 0.5 and were stable on her last visit.    She denies rest pain, non healing wounds on her feet or claudication at short distances.  Her mobility is limited by her COPD and she requires O2 24 hours a day.  She does have a left anterior shin scab with ecchymosis that her husband is dressing daily and putting antibiotic cream on.  She denies fever and chills.     Past Medical History:  Diagnosis Date   Alcohol abuse    Anemia    Cancer (HCC)    breast right   Chronic hyponatremia    COPD (chronic obstructive pulmonary disease) (HCC)    DCIS (ductal carcinoma in situ)    Dyspnea    Emphysema of lung (HCC)    Osteoporosis    Overactive bladder    PVD (peripheral vascular disease) (HCC)    Tobacco use     Past Surgical History:  Procedure Laterality Date   ABDOMINAL AORTOGRAM W/LOWER EXTREMITY N/A 03/15/2019   Procedure: ABDOMINAL AORTOGRAM W/LOWER EXTREMITY;  Surgeon: Nada Libman, MD;  Location: [facility];  Service:  Cardiovascular;  Laterality: N/A;   aeptoplasty     BREAST SURGERY     BRONCHIAL BRUSHINGS  02/12/2021   Procedure: BRONCHIAL BRUSHINGS;  Surgeon: Josephine Igo, DO;  Location: [facility];  Service: Pulmonary;;   BRONCHIAL NEEDLE ASPIRATION BIOPSY  02/12/2021   Procedure: BRONCHIAL NEEDLE ASPIRATION BIOPSIES;  Surgeon: Josephine Igo, DO;  Location: [facility];  Service: Pulmonary;;   cartaract extraction     EYE SURGERY Bilateral    Cataract in R & L eye and Retina surgery after cataract surgery in L eye   FIDUCIAL MARKER PLACEMENT  02/12/2021   Procedure: FIDUCIAL MARKER PLACEMENT;  Surgeon: Josephine Igo, DO;  Location: [facility];  Service: Pulmonary;;   INTRAMEDULLARY (IM) NAIL INTERTROCHANTERIC Left 01/30/2019   Procedure: INTRAMEDULLARY (IM) NAIL INTERTROCHANTRIC;  Surgeon: Gean Birchwood, MD;  Location: [facility];  Service: Orthopedics;  Laterality: Left;   IR FIBRIN GLUE REPAIR ANAL FISTULA     MASTECTOMY     s/p bl   PERIPHERAL VASCULAR INTERVENTION  03/15/2019   Procedure: PERIPHERAL VASCULAR INTERVENTION;  Surgeon: Nada Libman, MD;  Location: [facility];  Service: Cardiovascular;;  Lt. SFA   PERIPHERAL VASCULAR THROMBECTOMY  03/15/2019   Procedure: PERIPHERAL VASCULAR THROMBECTOMY;  Surgeon: Nada Libman, MD;  Location: [facility];  Service: Cardiovascular;;  Lt. AT, PT   RETINAL DETACHMENT SURGERY     sclerotherapy vein leg  VASCULAR SURGERY     VIDEO BRONCHOSCOPY WITH ENDOBRONCHIAL NAVIGATION Bilateral 02/12/2021   Procedure: VIDEO BRONCHOSCOPY WITH ENDOBRONCHIAL NAVIGATION;  Surgeon: Josephine Igo, DO;  Location: [facility];  Service: Pulmonary;  Laterality: Bilateral;  ION   VIDEO BRONCHOSCOPY WITH RADIAL ENDOBRONCHIAL ULTRASOUND  02/12/2021   Procedure: RADIAL ENDOBRONCHIAL ULTRASOUND;  Surgeon: Josephine Igo, DO;  Location: [facility];  Service: Pulmonary;;   vitreous retinal surgery      ROS:   General:  No weight loss, Fever,  chills  HEENT: No recent headaches, no nasal bleeding, no visual changes, no sore throat  Neurologic: No dizziness, blackouts, seizures. No recent symptoms of stroke or mini- stroke. No recent episodes of slurred speech, or temporary blindness.  Cardiac: No recent episodes of chest pain/pressure, no shortness of breath at rest.  No shortness of breath with exertion.  Denies history of atrial fibrillation or irregular heartbeat  Vascular: No history of rest pain in feet.  No history of claudication.  No history of non-healing ulcer, No history of DVT   Pulmonary: No home oxygen, no productive cough, no hemoptysis,  No asthma or wheezing  Musculoskeletal:  [ ]  Arthritis, [ ]  Low back pain,  [ ]  Joint pain  Hematologic:No history of hypercoagulable state.  No history of easy bleeding.  No history of anemia  Gastrointestinal: No hematochezia or melena,  No gastroesophageal reflux, no trouble swallowing  Urinary: [ ]  chronic Kidney disease, [ ]  on HD - [ ]  MWF or [ ]  TTHS, [ ]  Burning with urination, [ ]  Frequent urination, [ ]  Difficulty urinating;   Skin: No rashes  Psychological: No history of anxiety,  No history of depression  Social History Social History   Tobacco Use   Smoking status: Former    Current packs/day: 0.00    Average packs/day: 1.5 packs/day for 50.0 years (75.1 ttl pk-yrs)    Types: Cigarettes    Start date: 37    Quit date: 10/06/2016    Years since quitting: 6.3   Smokeless tobacco: Never  Vaping Use   Vaping status: Never Used  Substance Use Topics   Alcohol use: Yes    Alcohol/week: 28.0 standard drinks of alcohol    Types: 28 Glasses of wine per week    Comment: 4 glasses wine a night   Drug use: Not Currently    Family History Family History  Problem Relation Age of Onset   Alzheimer's disease Mother    Pneumonia Father        aspiration pneumonia    Allergies  Allergies  Allergen Reactions   Duloxetine Hcl Other (See Comments)      Current Outpatient Medications  Medication Sig Dispense Refill   albuterol (PROVENTIL) (2.5 MG/3ML) 0.083% nebulizer solution INHALE CONTENTS OF 1 VIAL VIA NEBULIZER EVERY 4 HOURS AS NEEDED FOR WHEEZING OR SHORTNESS OF BREATH 360 mL 17   albuterol (VENTOLIN HFA) 108 (90 Base) MCG/ACT inhaler USE 1 INHALATION EVERY 6 HOURS AS NEEDED FOR WHEEZING OR SHORTNESS OF BREATH 17 g 3   aspirin EC 81 MG tablet Take 81 mg by mouth daily.     atorvastatin (LIPITOR) 40 MG tablet      azelastine (ASTELIN) 0.1 % nasal spray USE 1 SPRAY IN EACH NOSTRIL TWICE A DAY 30 mL 6   Azelastine-Fluticasone 137-50 MCG/ACT SUSP Place 1 spray into the nose daily. 23 g 6   conjugated estrogens (PREMARIN) vaginal cream Place 1 Applicatorful vaginally every Monday, Wednesday, and Friday at  8 PM.     Cranberry 400 MG CAPS Take 1,200 mg by mouth daily at 2 PM.     denosumab (PROLIA) 60 MG/ML SOSY injection Inject 60 mg into the skin every 6 (six) months.      docusate sodium (COLACE) 100 MG capsule Take 1 capsule (100 mg total) by mouth 2 (two) times daily. (Patient taking differently: Take 100 mg by mouth at bedtime.) 10 capsule 0   Ferrous Fumarate (HEMOCYTE - 106 MG FE) 324 (106 Fe) MG TABS tablet Take 1 tablet by mouth daily at 3 pm.      fluticasone (FLONASE) 50 MCG/ACT nasal spray USE 2 SPRAYS IN EACH NOSTRIL TWICE A DAY 48 g 7   Fluticasone-Umeclidin-Vilant (TRELEGY ELLIPTA) 100-62.5-25 MCG/ACT AEPB Inhale 1 puff into the lungs daily at 6 (six) AM. 180 each 3   folic acid (FOLVITE) 1 MG tablet Take 1 mg by mouth daily.     gabapentin (NEURONTIN) 100 MG capsule TAKE 2 CAPSULES THREE TIMES A DAY 540 capsule 3   gabapentin (NEURONTIN) 300 MG capsule TAKE 2 CAPSULES THREE TIMES A DAY 540 capsule 3   guaiFENesin (MUCINEX) 600 MG 12 hr tablet Take 600 mg by mouth 2 (two) times daily.     hydrocortisone 2.5 % cream Apply 1 application topically daily.     Multiple Vitamin (MULTI-VITAMIN) tablet Take 1 tablet by mouth  daily.      mupirocin ointment (BACTROBAN) 2 % Apply 1 application topically daily as needed (wound care).      MYRBETRIQ 50 MG TB24 tablet Take 50 mg by mouth at bedtime.     nicotine polacrilex (COMMIT) 4 MG lozenge Take 4 mg by mouth as needed for smoking cessation.      OXYGEN Inhale 5 L into the lungs continuous.     pantoprazole (PROTONIX) 40 MG tablet Take 40 mg by mouth daily.     sodium chloride (OCEAN) 0.65 % SOLN nasal spray Place 1 spray into both nostrils 2 (two) times daily. As needed during the day     Vitamin D, Cholecalciferol, 25 MCG (1000 UT) TABS Take 1,000 Units by mouth daily.     No current facility-administered medications for this visit.    Physical Examination  Vitals:   02/16/23 1355  BP: (!) 164/71  Pulse: 89  Resp: 20  Temp: 97.7 F (36.5 C)  TempSrc: Temporal  SpO2: 98%  Weight: 94 lb 14.4 oz (43 kg)  Height: 5\' 2"  (1.575 m)    Body mass index is 17.36 kg/m.  General:  Alert and oriented, no acute distress HEENT: Normal Neck: No bruit or JVD Pulmonary: Clear to auscultation bilaterally Cardiac: Regular Rate and Rhythm without murmur Abdomen: Soft, non-tender, non-distended, no mass, no scars Skin: No rash     Extremity Pulses:  2+ radial,  right dorsalis pedis pulses, no ischemic changes to the foot on the left Musculoskeletal: No deformity or edema  Neurologic: Upper and lower extremity motor 5/5 and symmetric  DATA:  ABI/TBIToday's ABIToday's TBIPrevious ABIPrevious TBI  +-------+-----------+-----------+------------+------------+  Right 0.98       0.70       1.07        0.77          +-------+-----------+-----------+------------+------------+  Left  0.52       0.26       0.50        absent        +-------+-----------+-----------+------------+------------+       Right ABIs  appear essentially unchanged. Left ABIs appear essentially  unchanged.    Summary:  Right: Resting right ankle-brachial index is within  normal range. The  right toe-brachial index is abnormal.   Left: Resting left ankle-brachial index indicates moderate left lower  extremity arterial disease. The left toe-brachial index is abnormal.   +-----------+--------+-----+--------+----------+--------+  LEFT      PSV cm/sRatioStenosisWaveform  Comments  +-----------+--------+-----+--------+----------+--------+  CFA Prox   157                  triphasic           +-----------+--------+-----+--------+----------+--------+  DFA       138                  biphasic            +-----------+--------+-----+--------+----------+--------+  SFA Prox   188                  biphasic            +-----------+--------+-----+--------+----------+--------+  POP Prox   35                   monophasic          +-----------+--------+-----+--------+----------+--------+  POP Mid    50                   monophasic          +-----------+--------+-----+--------+----------+--------+  POP Distal 58                   monophasic          +-----------+--------+-----+--------+----------+--------+  ATA Distal 17                   monophasic          +-----------+--------+-----+--------+----------+--------+  PTA Distal 20                   monophasic          +-----------+--------+-----+--------+----------+--------+  PERO Distal49                   monophasic          +-----------+--------+-----+--------+----------+--------+     Left Stent(s):  +---------------++--------+++  Proximal Stent occluded  +---------------++--------+++  Mid Stent      occluded  +---------------++--------+++  Distal Stent   occluded  +---------------++--------+++  Distal to Stentoccluded  +---------------++--------+++      Summary:  Left: Left stent and ourflow artery occlusion with reconstitution in the  popliteal artery.   ASSESSMENT/PLAN:   Known PAD s/p multiple interventions  at [facility] and most recent left SFA stent placement by DR. Brabham. Known occluded SFA sent left  with reconstitution popliteal artery inflow and tibial disease that is unchanged on her studies today.  She remains asymptomatic for claudication, rest pain or non healing wounds. She ambulates with a Rolator around her house. Her mobility is limited due to her pulmonary status. She is on O2 24/7.   She is stable and is able to perform her daily activities without change.  The shin wound appears to be healing without signs of infection.  We will have continues to care for it at home.  If it does not heal or develops infection they will call our office.  I will schedule a f/u exam with repeat studies in 9 months.          Mosetta Pigeon PA-C [facility] [place] Office: [phone]  MD in clinic [place]

## 2023-02-18 ENCOUNTER — Ambulatory Visit
Admission: RE | Admit: 2023-02-18 | Discharge: 2023-02-18 | Disposition: A | Discharge status: Home / Self Care | Payer: Medicare Other | Source: Ambulatory Visit | Attending: Radiation Oncology | Admitting: Radiation Oncology

## 2023-02-18 DIAGNOSIS — C3432 Malignant neoplasm of lower lobe, left bronchus or lung: Secondary | ICD-10-CM

## 2023-02-18 DIAGNOSIS — C3431 Malignant neoplasm of lower lobe, right bronchus or lung: Secondary | ICD-10-CM

## 2023-02-18 NOTE — Progress Notes (Signed)
Radiation Oncology         [phone] ________________________________  Initial Outpatient Follow Up - Conducted via telephone at patient request.  I spoke with the patient to conduct this consult visit via telephone. The patient was notified in advance and was offered an in person or telemedicine meeting to allow for face to face communication but instead preferred to proceed with a telephone visit.   Name: Beth Daniels        MRN: [id number]  Date of Service: 02/18/2023 DOB: Jan 04, 1946  WU:[id number], Jonny Ruiz, MD  [name], MD     REFERRING PHYSICIAN: [name], MD   DIAGNOSIS: The primary encounter diagnosis was Malignant neoplasm of lower lobe of left lung (HCC). A diagnosis of Malignant neoplasm of lower lobe of right lung [facility]) was also pertinent to this visit.   HISTORY OF PRESENT ILLNESS: Beth Daniels is a 77 y.o. female with a diagnosis of Putative stage I lung cancer of the left lower lobe.  She has a remote  history greater than 10 years ago treated with bilateral mastectomy.  She has emphysema with continuous oxygen due to hypoxemic respiratory failure. Multiple scans have been followed by pulmonary and while 3 nodules increased in size, a PET in August 2022 did not show hypermetabolic change. She did undergo bronchoscopy in the summer 2022 and all the nodules that were sampled were negative for disease and remained suspicious in behavior. She was not a good candidate for biopsy and went on to receive definitive SBRT to the LLL which she completed in March 2023.  A CT scan of the chest on 05/14/22 showed stable post treatment change in the LLL region near the lingula. Stable nodules remain in other lobes. Stigmata of atherosclerotic changes, emphysematous changes of the lungs, and stable nodule in the right adrenal gland remained consistent with adenoma. She also had what appears to be dilated ventricle and borderline cardiac enlargement in addition to a stable pericardial cystic structure.   She did have a positive Hemoccult when we had originally met with her in 2023 but medically she was unable to go for colonoscopy, it is not clear if there is further plan for this in the future. Scans that have been done in January 2024 had shown new spiculation in the nodule that had been followed in the right lower lobe and PET scan on 08/26/2022 showed hypermetabolic activity in this nodule with an SUV of 3.9.  This was described as right lower lobe in the body of the report but upper lobe and the impression.  She underwent treatment of the right lower lobe nodule with radiotherapy and posttreatment imaging in April 2024 showed stability in the sites of prior therapy with extensive areas of lung nodularity as previously described with severe emphysematous and chronic lung changes.  A decreasing left pleural effusion was noted as were stable changes of bilateral adrenal nodularity emphysema and atherosclerotic disease.  She was encouraged to have a short interval repeat scan which was performed on 02/11/2023, and Dr. Joellen Jersey personal review the area that has previously been treated in the left lower lobe has increased in size though this is felt to be stable postradiation change that is anticipated from her prior therapy.  Adjacent to this in the superior left lower lobe was a nodule measuring 1.3 cm, previously 1.1 cm but is felt to be inflammatory in nature and has not been treated with radiation.  In the right lung, posttreatment changes felt to be somewhat  stable as well as increased in overall dimension with subsolid change, currently measuring 2.2 cm this is reflecting the site of prior treatment.  Another right lower lobe nodule peripherally measured 8 mm slightly decreased from previous with no evidence of adenopathy or evidence of new nodularity.  She is contacted by phone to review these results.   PREVIOUS RADIATION THERAPY:   09/11/22 -09/24/22 SBRT Treatment The tumor in the RLL was treated with a  course of stereotactic body radiation treatment. The patient received 54 Gy in 3 fractions at 18 G per fraction.  09/10/21-09/17/21 SBRT Treatment The tumor in the LLL was treated with a course of stereotactic body radiation treatment. The patient received 54 Gy In 3 fractions at 18 G per fraction.  PAST MEDICAL HISTORY:  Past Medical History:  Diagnosis Date   Alcohol abuse    Anemia    Cancer (HCC)    breast right   Chronic hyponatremia    COPD (chronic obstructive pulmonary disease) (HCC)    DCIS (ductal carcinoma in situ)    Dyspnea    Emphysema of lung (HCC)    Osteoporosis    Overactive bladder    PVD (peripheral vascular disease) (HCC)    Tobacco use        PAST SURGICAL HISTORY: Past Surgical History:  Procedure Laterality Date   ABDOMINAL AORTOGRAM W/LOWER EXTREMITY N/A 03/15/2019   Procedure: ABDOMINAL AORTOGRAM W/LOWER EXTREMITY;  Surgeon: Nada Libman, MD;  Location: [facility];  Service: Cardiovascular;  Laterality: N/A;   aeptoplasty     BREAST SURGERY     BRONCHIAL BRUSHINGS  02/12/2021   Procedure: BRONCHIAL BRUSHINGS;  Surgeon: Josephine Igo, DO;  Location: [facility];  Service: Pulmonary;;   BRONCHIAL NEEDLE ASPIRATION BIOPSY  02/12/2021   Procedure: BRONCHIAL NEEDLE ASPIRATION BIOPSIES;  Surgeon: Josephine Igo, DO;  Location: [facility];  Service: Pulmonary;;   cartaract extraction     EYE SURGERY Bilateral    Cataract in R & L eye and Retina surgery after cataract surgery in L eye   FIDUCIAL MARKER PLACEMENT  02/12/2021   Procedure: FIDUCIAL MARKER PLACEMENT;  Surgeon: Josephine Igo, DO;  Location: [facility];  Service: Pulmonary;;   INTRAMEDULLARY (IM) NAIL INTERTROCHANTERIC Left 01/30/2019   Procedure: INTRAMEDULLARY (IM) NAIL INTERTROCHANTRIC;  Surgeon: Gean Birchwood, MD;  Location: [facility];  Service: Orthopedics;  Laterality: Left;   IR FIBRIN GLUE REPAIR ANAL FISTULA     MASTECTOMY     s/p bl   PERIPHERAL VASCULAR INTERVENTION   03/15/2019   Procedure: PERIPHERAL VASCULAR INTERVENTION;  Surgeon: Nada Libman, MD;  [facility]: [facility];  Service: Cardiovascular;;  Lt. SFA   PERIPHERAL VASCULAR THROMBECTOMY  03/15/2019   Procedure: PERIPHERAL VASCULAR THROMBECTOMY;  Surgeon: Nada Libman, MD;  [facility]: [facility];  Service: Cardiovascular;;  Lt. AT, PT   RETINAL DETACHMENT SURGERY     sclerotherapy vein leg     VASCULAR SURGERY     VIDEO BRONCHOSCOPY WITH ENDOBRONCHIAL NAVIGATION Bilateral 02/12/2021   Procedure: VIDEO BRONCHOSCOPY WITH ENDOBRONCHIAL NAVIGATION;  Surgeon: Josephine Igo, DO;  Location: [facility];  Service: Pulmonary;  Laterality: Bilateral;  ION   VIDEO BRONCHOSCOPY WITH RADIAL ENDOBRONCHIAL ULTRASOUND  02/12/2021   Procedure: RADIAL ENDOBRONCHIAL ULTRASOUND;  Surgeon: Josephine Igo, DO;  Location: [facility];  Service: Pulmonary;;   vitreous retinal surgery       FAMILY HISTORY:  Family History  Problem Relation Age of Onset  Alzheimer's disease Mother    Pneumonia Father        aspiration pneumonia     SOCIAL HISTORY:  reports that she quit smoking about 6 years ago. Her smoking use included cigarettes. She started smoking about 54 years ago. She has a 75.1 pack-year smoking history. She has never used smokeless tobacco. She reports current alcohol use of about 28.0 standard drinks of alcohol per week. She reports that she does not currently use drugs. The patient is married and lives in [place] and she is a retired from working in [employer], political party.   ALLERGIES: Duloxetine hcl   MEDICATIONS:  Current Outpatient Medications  Medication Sig Dispense Refill   albuterol (PROVENTIL) (2.5 MG/3ML) 0.083% nebulizer solution INHALE CONTENTS OF 1 VIAL VIA NEBULIZER EVERY 4 HOURS AS NEEDED FOR WHEEZING OR SHORTNESS OF BREATH 360 mL 17   albuterol (VENTOLIN HFA) 108 (90 Base) MCG/ACT inhaler USE 1 INHALATION EVERY 6 HOURS AS NEEDED FOR WHEEZING OR SHORTNESS OF BREATH 17 g  3   aspirin EC 81 MG tablet Take 81 mg by mouth daily.     atorvastatin (LIPITOR) 40 MG tablet      azelastine (ASTELIN) 0.1 % nasal spray USE 1 SPRAY IN EACH NOSTRIL TWICE A DAY 30 mL 6   Azelastine-Fluticasone 137-50 MCG/ACT SUSP Place 1 spray into the nose daily. 23 g 6   conjugated estrogens (PREMARIN) vaginal cream Place 1 Applicatorful vaginally every Monday, Wednesday, and Friday at 8 PM.     Cranberry 400 MG CAPS Take 1,200 mg by mouth daily at 2 PM.     denosumab (PROLIA) 60 MG/ML SOSY injection Inject 60 mg into the skin every 6 (six) months.      docusate sodium (COLACE) 100 MG capsule Take 1 capsule (100 mg total) by mouth 2 (two) times daily. (Patient taking differently: Take 100 mg by mouth at bedtime.) 10 capsule 0   Ferrous Fumarate (HEMOCYTE - 106 MG FE) 324 (106 Fe) MG TABS tablet Take 1 tablet by mouth daily at 3 pm.      fluticasone (FLONASE) 50 MCG/ACT nasal spray USE 2 SPRAYS IN EACH NOSTRIL TWICE A DAY 48 g 7   Fluticasone-Umeclidin-Vilant (TRELEGY ELLIPTA) 100-62.5-25 MCG/ACT AEPB Inhale 1 puff into the lungs daily at 6 (six) AM. 180 each 3   folic acid (FOLVITE) 1 MG tablet Take 1 mg by mouth daily.     gabapentin (NEURONTIN) 100 MG capsule TAKE 2 CAPSULES THREE TIMES A DAY 540 capsule 3   gabapentin (NEURONTIN) 300 MG capsule TAKE 2 CAPSULES THREE TIMES A DAY 540 capsule 3   guaiFENesin (MUCINEX) 600 MG 12 hr tablet Take 600 mg by mouth 2 (two) times daily.     hydrocortisone 2.5 % cream Apply 1 application topically daily.     Multiple Vitamin (MULTI-VITAMIN) tablet Take 1 tablet by mouth daily.      mupirocin ointment (BACTROBAN) 2 % Apply 1 application topically daily as needed (wound care).      MYRBETRIQ 50 MG TB24 tablet Take 50 mg by mouth at bedtime.     nicotine polacrilex (COMMIT) 4 MG lozenge Take 4 mg by mouth as needed for smoking cessation.      OXYGEN Inhale 5 L into the lungs continuous.     pantoprazole (PROTONIX) 40 MG tablet Take 40 mg by mouth  daily.     sodium chloride (OCEAN) 0.65 % SOLN nasal spray Place 1 spray into both nostrils 2 (two) times daily. As needed  during the day     Vitamin D, Cholecalciferol, 25 MCG (1000 UT) TABS Take 1,000 Units by mouth daily.     No current facility-administered medications for this encounter.     REVIEW OF SYSTEMS: On review of systems the patient reports that she is doing okay but is very frustrated in her course. She feels like she has so many subspecialists and doesn't feel we are all working together for her care. She is concerned about her breathing but acknowledges this as being stable overall with 5L O2, and that she rests when she becomes short of breath with exertion. She denies any fevers, chills, recent post nasal drip or congestion. She has had bruising and a wound on the left anterior shin and vascular surgery has seen this recently. No other complaints are verbalized.       PHYSICAL EXAM:  Unable to assess due to encounter type.    ECOG = 1  0 - Asymptomatic (Fully active, able to carry on all predisease activities without restriction)  1 - Symptomatic but completely ambulatory (Restricted in physically strenuous activity but ambulatory and able to carry out work of a light or sedentary nature. For example, light housework, office work)  2 - Symptomatic, <50% in bed during the day (Ambulatory and capable of all self care but unable to carry out any work activities. Up and about more than 50% of waking hours)  3 - Symptomatic, >50% in bed, but not bedbound (Capable of only limited self-care, confined to bed or chair 50% or more of waking hours)  4 - Bedbound (Completely disabled. Cannot carry on any self-care. Totally confined to bed or chair)  5 - Death   Santiago Glad MM, Creech RH, Tormey DC, et al. [phone]). "Toxicity and response criteria of the [facility]". Am. Evlyn Clines. Oncol. 5 (6): 649-55    LABORATORY DATA:  Lab Results  Component Value Date    WBC 3.8 (L) 05/22/2022   HGB 11.2 (L) 05/22/2022   HCT 33.5 (L) 05/22/2022   MCV 103.6 (H) 05/22/2022   PLT 142.0 (L) 05/22/2022   Lab Results  Component Value Date   NA 136 05/22/2022   K 4.7 05/22/2022   CL 92 (L) 05/22/2022   CO2 39 (H) 05/22/2022   Lab Results  Component Value Date   ALT 17 10/01/2021   AST 33 10/01/2021   ALKPHOS 70 10/01/2021   BILITOT 0.4 10/01/2021      RADIOGRAPHY: CT CHEST W CONTRAST  Result Date: 02/17/2023 CLINICAL DATA:  Non-small cell lung cancer (NSCLC), monitor S/P SBRT. Breast cancer. * Tracking Code: BO * EXAM: CT CHEST WITH CONTRAST TECHNIQUE: Multidetector CT imaging of the chest was performed during intravenous contrast administration. RADIATION DOSE REDUCTION: This exam was performed according to the departmental dose-optimization program which includes automated exposure control, adjustment of the mA and/or kV according to patient size and/or use of iterative reconstruction technique. CONTRAST:  75mL OMNIPAQUE IOHEXOL 300 MG/ML  SOLN COMPARISON:  11/03/2022 chest CT.  08/26/2022 PET-CT. FINDINGS: Cardiovascular: Normal heart size. No significant pericardial effusion/thickening. Left anterior descending coronary atherosclerosis. Atherosclerotic nonaneurysmal thoracic aorta. Normal caliber pulmonary arteries. No central pulmonary emboli. Mediastinum/Nodes: No significant thyroid nodules. Unremarkable esophagus. No pathologically enlarged axillary, mediastinal or hilar lymph nodes. Lungs/Pleura: No pneumothorax. No pleural effusion. Severe centrilobular emphysema with mild diffuse bronchial wall thickening. Irregular solid superior segment left lower lobe 1.3 x 1.2 cm nodule (series 6/image 57), increased from 1.1 x 0.8 cm on 11/03/2022  chest CT using similar measurement technique. Anterior left lower lobe irregular solid 2.2 x 1.3 cm nodule (series 6/image 99), increased from 1.5 x 1.1 cm. Nodular fibrosis in the posterior left upper lobe measuring 2.2 x  1.3 cm, stable. Several solid and sub solid right pulmonary nodules are all stable or decreased. Representative anteromedial right upper lobe 1.0 cm solid nodule (series 6/image 50) and 1.1 cm superior segment right lower lobe nodule medially (series 6/image 45), both stable. Irregular sub solid 2.2 x 1.2 cm basilar right upper lobe nodule (series 6/image 67), decreased from 3.1 x 2.9 cm. Superior segment right lower lobe peripheral 0.8 x 0.6 cm nodule (series 6/image 59), slightly decreased from 0.9 x 0.7 cm. No new significant pulmonary nodules. Upper abdomen: No acute abnormality. Musculoskeletal: No aggressive appearing focal osseous lesions. Chronic moderate T12 vertebral compression fracture. Mild thoracic spondylosis. IMPRESSION: 1. Mixed interval changes. The two irregular solid left lower lobe pulmonary nodules have increased from 11/03/2022, suspicious for recurrent/progressive malignancy. 2. Several solid and subsolid right pulmonary nodules are all stable or decreased. No new significant pulmonary nodules. 3. No thoracic adenopathy. 4. One vessel coronary atherosclerosis. 5. Chronic moderate T12 vertebral compression fracture. 6. Aortic Atherosclerosis (ICD10-I70.0) and Emphysema (ICD10-J43.9). Electronically Signed   By: Delbert Phenix M.D.   On: 02/17/2023 08:47   VAS [place] LOWER EXTREMITY ARTERIAL DUPLEX  Result Date: 02/16/2023 LOWER EXTREMITY ARTERIAL DUPLEX STUDY Patient Name:  Shantea E Vivier  Date of Exam:   02/16/2023 Medical Rec #: [id number]  Accession #:    [id number] Date of Birth: [birth year]   Patient Gender: F Patient Age:   62 years Exam Location:  [facility] Procedure:      VAS [place] LOWER EXTREMITY ARTERIAL DUPLEX Referring Phys: Coral Else --------------------------------------------------------------------------------  Indications: Peripheral artery disease.  Vascular Interventions: History of stent left leg, outside facility 10/24/2016.                         Drug coated  balloon angioplasty 09/2017 & 05/2018. Current ABI:            Right ABI 0.98 Left 0.52 Comparison Study: No change since prior exam of 11/25/2021 Performing Technologist: Elita Quick RVT  Examination Guidelines: A complete evaluation includes B-mode imaging, spectral Doppler, color Doppler, and power Doppler as needed of all accessible portions of each vessel. Bilateral testing is considered an integral part of a complete examination. Limited examinations for reoccurring indications may be performed as noted.   +-----------+--------+-----+--------+----------+--------+ LEFT       PSV cm/sRatioStenosisWaveform  Comments +-----------+--------+-----+--------+----------+--------+ CFA Prox   157                  triphasic          +-----------+--------+-----+--------+----------+--------+ DFA        138                  biphasic           +-----------+--------+-----+--------+----------+--------+ SFA Prox   188                  biphasic           +-----------+--------+-----+--------+----------+--------+ POP Prox   35                   monophasic         +-----------+--------+-----+--------+----------+--------+ POP Mid    50  monophasic         +-----------+--------+-----+--------+----------+--------+ POP Distal 58                   monophasic         +-----------+--------+-----+--------+----------+--------+ ATA Distal 17                   monophasic         +-----------+--------+-----+--------+----------+--------+ PTA Distal 20                   monophasic         +-----------+--------+-----+--------+----------+--------+ PERO Distal49                   monophasic         +-----------+--------+-----+--------+----------+--------+  Left Stent(s): +---------------++--------+++ Proximal Stent occluded +---------------++--------+++ Mid Stent      occluded +---------------++--------+++ Distal Stent   occluded  +---------------++--------+++ Distal to Stentoccluded +---------------++--------+++    Summary: Left: Left stent and ourflow artery occlusion with reconstitution in the popliteal artery.  See table(s) above for measurements and observations. Electronically signed by Coral Else MD on 02/16/2023 at 3:20:36 PM.    Final    VAS [place] ABI WITH/WO TBI  Result Date: 02/16/2023  LOWER EXTREMITY DOPPLER STUDY Patient Name:  LASHAWANDA NORBECK  Date of Exam:   02/16/2023 Medical Rec #: [id number]  Accession #:    [id number] Date of Birth: [birth year]   Patient Gender: F Patient Age:   13 years Exam Location:  [facility] Procedure:      VAS [place] ABI WITH/WO TBI Referring Phys: Coral Else --------------------------------------------------------------------------------  Indications: Peripheral artery disease. High Risk Factors: Past history of smoking. Other Factors: History of left stent, outside facility (10/24/2016). Drug coated                balloon angioplasty 09/2017 & 05/2018.  Performing Technologist: Elita Quick RVT  Examination Guidelines: A complete evaluation includes at minimum, Doppler waveform signals and systolic blood pressure reading at the level of bilateral brachial, anterior tibial, and posterior tibial arteries, when vessel segments are accessible. Bilateral testing is considered an integral part of a complete examination. Photoelectric Plethysmograph (PPG) waveforms and toe systolic pressure readings are included as required and additional duplex testing as needed. Limited examinations for reoccurring indications may be performed as noted.  ABI Findings: +---------+------------------+-----+--------+--------+ Right    Rt Pressure (mmHg)IndexWaveformComment  +---------+------------------+-----+--------+--------+ PTA      137               0.98 biphasic         +---------+------------------+-----+--------+--------+ DP       133               0.95 biphasic          +---------+------------------+-----+--------+--------+ Great Toe98                0.70 Normal           +---------+------------------+-----+--------+--------+ +---------+------------------+-----+----------+-------+ Left     Lt Pressure (mmHg)IndexWaveform  Comment +---------+------------------+-----+----------+-------+ Brachial 140                                      +---------+------------------+-----+----------+-------+ PTA      73                0.52 monophasic        +---------+------------------+-----+----------+-------+ DP  70                0.50 monophasic        +---------+------------------+-----+----------+-------+ Great Toe37                0.26 Abnormal          +---------+------------------+-----+----------+-------+ +-------+-----------+-----------+------------+------------+ ABI/TBIToday's ABIToday's TBIPrevious ABIPrevious TBI +-------+-----------+-----------+------------+------------+ Right  0.98       0.70       1.07        0.77         +-------+-----------+-----------+------------+------------+ Left   0.52       0.26       0.50        absent       +-------+-----------+-----------+------------+------------+  Right ABIs appear essentially unchanged. Left ABIs appear essentially unchanged.  Summary: Right: Resting right ankle-brachial index is within normal range. The right toe-brachial index is abnormal. Left: Resting left ankle-brachial index indicates moderate left lower extremity arterial disease. The left toe-brachial index is abnormal. *See table(s) above for measurements and observations.  Electronically signed by Coral Else MD on 02/16/2023 at 3:20:16 PM.    Final        IMPRESSION/PLAN: 1. Putative Stage IA1, cT1aN0M0, NSCLC of the RLL  with History of  Putative Stage IA2, cT1bN0M0, NSCLC of the LLL along the level of the lingula. To summarize the patient's imaging findings at this time, the treatment site to the right lower  lobe nodule appears stable, and the superior segment of the right lower lobe nodule also being followed is felt to be stable to slightly improved.  On the left side the area of prior radiation has increased in size but still felt to be postradiation architecture and a nodule nearby which has increased in measuring 1.3 cm in the superior segment of the left lower lobe is felt to be inflammatory in nature but needing close follow-up attention. 2. Peripheral vascular disease. She will follow up with the vascular surgery and we will follow this expectantly. 3. Heme positive stool. It is unclear the next steps for this and she is unaware of any plans for further work up. 4.  O2 dependent COPD. She will continue to follow up with pulmonary medicine as well.   This encounter was conducted via telephone.  The patient has provided two factor identification and has given verbal consent for this type of encounter and has been advised to only accept a meeting of this type in a secure network environment. The time spent during this encounter was 45 minutes including preparation, discussion, and coordination of the patient's care. The attendants for this meeting include  Ronny Bacon  and Gasper Sells. During the encounter,   Ronny Bacon was located at [facility] Radiation Oncology Department.  Gasper Sells was located at home.     Osker Mason, [facility]   **Disclaimer: This note was dictated with voice recognition software. Similar sounding words can inadvertently be transcribed and this note may contain transcription errors which may not have been corrected upon publication of note.**

## 2023-02-19 ENCOUNTER — Other Ambulatory Visit: Payer: Self-pay

## 2023-02-19 DIAGNOSIS — I779 Disorder of arteries and arterioles, unspecified: Secondary | ICD-10-CM

## 2023-02-23 ENCOUNTER — Ambulatory Visit: Payer: Medicare Other | Admitting: Radiation Oncology

## 2023-02-24 ENCOUNTER — Other Ambulatory Visit (HOSPITAL_COMMUNITY): Payer: Self-pay | Admitting: *Deleted

## 2023-02-25 ENCOUNTER — Encounter (HOSPITAL_COMMUNITY): Payer: Medicare Other

## 2023-02-25 ENCOUNTER — Encounter (HOSPITAL_COMMUNITY): Payer: Self-pay

## 2023-03-13 ENCOUNTER — Encounter (HOSPITAL_COMMUNITY)
Admission: RE | Admit: 2023-03-13 | Discharge: 2023-03-13 | Disposition: A | Discharge status: Home / Self Care | Payer: Medicare Other | Source: Ambulatory Visit | Attending: Internal Medicine | Admitting: Internal Medicine

## 2023-03-13 DIAGNOSIS — M810 Age-related osteoporosis without current pathological fracture: Secondary | ICD-10-CM | POA: Diagnosis present

## 2023-03-13 MED ORDER — DENOSUMAB 60 MG/ML ~~LOC~~ SOSY
PREFILLED_SYRINGE | SUBCUTANEOUS | Status: AC
  Administered 2023-03-13: 60 mg via SUBCUTANEOUS
  Filled 2023-03-13: qty 1

## 2023-03-13 MED ORDER — DENOSUMAB 60 MG/ML ~~LOC~~ SOSY: 60.0000 mg | PREFILLED_SYRINGE | Freq: Once | SUBCUTANEOUS | Status: AC

## 2023-03-19 NOTE — Progress Notes (Signed)
Chief Complaint  Patient presents with   Follow-up    Rm 3, here with husband Roe Coombs Pt is here for follow-up for bilateral lower extremity neuropathy. Pt states the she continues with left leg pain, states the gabapentin is not helping. Pt states she has been dx with lung cancer within the past year.      ASSESSMENT AND PLAN  Beth Daniels is a 77 y.o. female   Left leg pain following hip procedure in 2020 Left sciatic neuropathy  Continue gabapentin 800mg  3 times daily, discussed taking last dose 1-2 hrs prior to bedtime. Discussed ways to help alleviate pressure on left side while in bed  Would be hesitant to increase dosage further due to potential for side effects  Prior intolerance to duloxetine, not interested in venlafaxine due to potential for weight loss  Due to complicated medical history and overall frail state, further treatment options that we can offer are limited, referral placed to pain management for further recommendations  Continued use of a rolling walker at all times for fall prevention  MRI lumbar 2021 moderate disc changes throughout with facet hypertrophy resulting in only minor posterior canal narrowing at L2-3 and L4-5 but without significant compression  MRI pelvis 2021 no pathology involving compression of the sciatic nerve noted      Follow up will be determined if needed after evaluation with pain management, advised to call if anything is needed in the mean time     DIAGNOSTIC DATA (LABS, IMAGING, TESTING) - I reviewed patient records, labs, notes, testing and imaging myself where available.     MEDICAL HISTORY:  Update 03/23/2023 JM: Patient returns for follow-up visit accompanied by her husband. Reports continued LLE pain, radiates from back down to foot, has constant numbness/tingling in left foot, pain in left leg, worse at night, has difficulty getting comfortable. Uses padded seat which helps leg pain.  Continues on gabapentin 800 mg 3 times  daily (takes last dose right at bedtime). Use of rollator walker at all times, denies any recent falls. Does wake up frequently at night to urinate, has bedside commode.      History provided for reference purposes only UPDATE Sept 28 2023 Dr. Terrace Arabia: She continued complaints of bilateral lower extremity paresthesia, despite relative high dose of gabapentin 800 mg 3 times a day, complains of difficulty sleeping, frequent leg movement,  Worsening shortness of breath due to her lung issues, previous smoker, emphysema, lung cancer, status post radiation therapy,  She also noticed bilateral hands resting tremor, no limitation in her daily activity, no parkinsonian features, continue to drink wines, 8 ounce 3 cups each day   Initial visit 10/01/2021 Dr. Terrace Arabia (transfer of care): Beth Daniels is a 77 year old female, accompanied by her husband to follow-up for bilateral lower extremity neuropathy, neuropathic pain, she was a patient of Dr. Anne Hahn in the past, her primary care physician is Dr. Timothy Lasso, [facility].  I reviewed and summarized the referring note. PMHX. Emphysema, Home oxygen dependent, she was smoker for many years,  Right, Breast cancer , bilateral mastotomy,  GERD HLD Right lung cancer ,s/p radiation therapy Peripheral vascular disease.  She began to notice bilateral feet paresthesia around 2013, describes pins and needle sensation at the plantar surface, heel, and toes, per record, initial EMG nerve conduction study was normal, laboratory evaluation for treatable etiology was nonrevealing.  In July 2020, she suffered left femur fracture required surgery,  Since then, she noticed apparent asymmetry of lower  extremity symptoms, whenever she sat on the hard surface, put the weight at her buttock region, she felt deep achiness of left calf,  EMG nerve conduction study at the [facility] in 2021 showed some active denervation on the left gastrocnemius, chronic neuropathic changes in the  hamstring muscles, nerve conduction study on the left side was other wise on remarkable, in specific, sensory nerve conduction study were within normal limit.  She was diagnosed with possible left sciatic nerve compression with a background of peripheral neuropathy  I personally reviewed MRI lumbar October 2021, mild degenerative changes no significant canal foraminal narrowing  MRI of pelvis without contrast October showed mild osteoarthritis of left hip, left femur neck ORIF with hardware in place  Over the years, she has been managed by titrating dose of gabapentin currently taking 300 mg two +100 mg 2 tablets 3 times a day, equivalent to 2400 mg daily  She spent most of the time in a sitting down position, her major limitation is her multiple medical history, including emphysema, right lung cancer, status post radiation therapy finished in March 2023, previously heavy smoker, she is home oxygen dependent 24/7  In addition, she had long history of alcohol use, currently has poor appetite, but drink 8 ounce x 3 throughout the day  She describes early afternoon bilateral feet and lower extremity achy pain, at night difficulty staying asleep      PHYSICAL EXAM:   Vitals:   03/23/23 1422  BP: (!) 151/74  Pulse: 100  Weight: 94 lb 3.2 oz (42.7 kg)  Height: 5\' 2"  (1.575 m)    Not recorded     Body mass index is 17.23 kg/m.  PHYSICAL EXAMNIATION:  Gen: NAD, conversant, well nourised, well groomed                     Cardiovascular: Regular rate rhythm, no peripheral edema, warm, nontender. Eyes: Conjunctivae clear without exudates or hemorrhage Neck: Supple, no carotid bruits. Pulmonary: Clear to auscultation bilaterally   NEUROLOGICAL EXAM:  MENTAL STATUS: Speech/cognition: Awake, alert, oriented to history taking and care conversation, shortness of breath with prolonged talking, nasal oxygen   CRANIAL NERVES: CN II: Visual fields are full to confrontation. Pupils are  round equal and briskly reactive to light. CN III, IV, VI: extraocular movement are normal. No ptosis. CN V: Facial sensation is intact to light touch CN VII: Face is symmetric with normal eye closure  CN VIII: Hearing is normal to causal conversation. CN IX, X: Phonation is normal. CN XI: Head turning and shoulder shrug are intact  MOTOR: Mild bilateral hands postural tremor, normal strength, no rigidity no bradykinesia,  REFLEXES: Reflexes are 1 and symmetric at the biceps, triceps, 1 knees, and absent at ankles. Plantar responses are flexor.  SENSORY: Intact to light touch, pinprick and vibratory sensation are intact in fingers and toes.  COORDINATION: There is no trunk or limb dysmetria noted.  GAIT/STANCE: She needs push-up to get up from seated position, cautious, mildly unsteady  REVIEW OF SYSTEMS:  Full 14 system review of systems performed and notable only for as above All other review of systems were negative.   ALLERGIES: Allergies  Allergen Reactions   Duloxetine Hcl Other (See Comments)    HOME MEDICATIONS: Current Outpatient Medications  Medication Sig Dispense Refill   albuterol (PROVENTIL) (2.5 MG/3ML) 0.083% nebulizer solution INHALE CONTENTS OF 1 VIAL VIA NEBULIZER EVERY 4 HOURS AS NEEDED FOR WHEEZING OR SHORTNESS OF BREATH 360 mL 17  albuterol (VENTOLIN HFA) 108 (90 Base) MCG/ACT inhaler USE 1 INHALATION EVERY 6 HOURS AS NEEDED FOR WHEEZING OR SHORTNESS OF BREATH 17 g 3   aspirin EC 81 MG tablet Take 81 mg by mouth daily.     atorvastatin (LIPITOR) 40 MG tablet      azelastine (ASTELIN) 0.1 % nasal spray USE 1 SPRAY IN EACH NOSTRIL TWICE A DAY 30 mL 6   Azelastine-Fluticasone 137-50 MCG/ACT SUSP Place 1 spray into the nose daily. 23 g 6   conjugated estrogens (PREMARIN) vaginal cream Place 1 Applicatorful vaginally every Monday, Wednesday, and Friday at 8 PM.     Cranberry 400 MG CAPS Take 1,200 mg by mouth daily at 2 PM.     denosumab (PROLIA) 60 MG/ML  SOSY injection Inject 60 mg into the skin every 6 (six) months.      docusate sodium (COLACE) 100 MG capsule Take 1 capsule (100 mg total) by mouth 2 (two) times daily. (Patient taking differently: Take 100 mg by mouth at bedtime.) 10 capsule 0   Ferrous Fumarate (HEMOCYTE - 106 MG FE) 324 (106 Fe) MG TABS tablet Take 1 tablet by mouth daily at 3 pm.      fluticasone (FLONASE) 50 MCG/ACT nasal spray USE 2 SPRAYS IN EACH NOSTRIL TWICE A DAY 48 g 7   Fluticasone-Umeclidin-Vilant (TRELEGY ELLIPTA) 100-62.5-25 MCG/ACT AEPB Inhale 1 puff into the lungs daily at 6 (six) AM. 180 each 3   folic acid (FOLVITE) 1 MG tablet Take 1 mg by mouth daily.     gabapentin (NEURONTIN) 100 MG capsule TAKE 2 CAPSULES THREE TIMES A DAY 540 capsule 3   gabapentin (NEURONTIN) 300 MG capsule TAKE 2 CAPSULES THREE TIMES A DAY 540 capsule 3   guaiFENesin (MUCINEX) 600 MG 12 hr tablet Take 600 mg by mouth 2 (two) times daily.     hydrocortisone 2.5 % cream Apply 1 application topically daily.     Multiple Vitamin (MULTI-VITAMIN) tablet Take 1 tablet by mouth daily.      mupirocin ointment (BACTROBAN) 2 % Apply 1 application topically daily as needed (wound care).      MYRBETRIQ 50 MG TB24 tablet Take 50 mg by mouth at bedtime.     nicotine polacrilex (COMMIT) 4 MG lozenge Take 4 mg by mouth as needed for smoking cessation.      OXYGEN Inhale 5 L into the lungs continuous.     pantoprazole (PROTONIX) 40 MG tablet Take 40 mg by mouth daily.     sodium chloride (OCEAN) 0.65 % SOLN nasal spray Place 1 spray into both nostrils 2 (two) times daily. As needed during the day     Vitamin D, Cholecalciferol, 25 MCG (1000 UT) TABS Take 1,000 Units by mouth daily.     No current facility-administered medications for this visit.    PAST MEDICAL HISTORY: Past Medical History:  Diagnosis Date   Alcohol abuse    Anemia    Cancer (HCC)    breast right   Chronic hyponatremia    COPD (chronic obstructive pulmonary disease) (HCC)     DCIS (ductal carcinoma in situ)    Dyspnea    Emphysema of lung (HCC)    Lung cancer (HCC)    has received 2 treatments of radiation   Osteoporosis    Overactive bladder    PVD (peripheral vascular disease) (HCC)    Tobacco use     PAST SURGICAL HISTORY: Past Surgical History:  Procedure Laterality Date   ABDOMINAL AORTOGRAM  W/LOWER EXTREMITY N/A 03/15/2019   Procedure: ABDOMINAL AORTOGRAM W/LOWER EXTREMITY;  Surgeon: Nada Libman, MD;  Location: [facility];  Service: Cardiovascular;  Laterality: N/A;   aeptoplasty     BREAST SURGERY     BRONCHIAL BRUSHINGS  02/12/2021   Procedure: BRONCHIAL BRUSHINGS;  Surgeon: Josephine Igo, DO;  Location: [facility];  Service: Pulmonary;;   BRONCHIAL NEEDLE ASPIRATION BIOPSY  02/12/2021   Procedure: BRONCHIAL NEEDLE ASPIRATION BIOPSIES;  Surgeon: Josephine Igo, DO;  Location: [facility];  Service: Pulmonary;;   cartaract extraction     EYE SURGERY Bilateral    Cataract in R & L eye and Retina surgery after cataract surgery in L eye   FIDUCIAL MARKER PLACEMENT  02/12/2021   Procedure: FIDUCIAL MARKER PLACEMENT;  Surgeon: Josephine Igo, DO;  Location: [facility];  Service: Pulmonary;;   INTRAMEDULLARY (IM) NAIL INTERTROCHANTERIC Left 01/30/2019   Procedure: INTRAMEDULLARY (IM) NAIL INTERTROCHANTRIC;  Surgeon: Gean Birchwood, MD;  Location: [facility];  Service: Orthopedics;  Laterality: Left;   IR FIBRIN GLUE REPAIR ANAL FISTULA     MASTECTOMY     s/p bl   PERIPHERAL VASCULAR INTERVENTION  03/15/2019   Procedure: PERIPHERAL VASCULAR INTERVENTION;  Surgeon: Nada Libman, MD;  Location: [facility];  Service: Cardiovascular;;  Lt. SFA   PERIPHERAL VASCULAR THROMBECTOMY  03/15/2019   Procedure: PERIPHERAL VASCULAR THROMBECTOMY;  Surgeon: Nada Libman, MD;  Location: [facility];  Service: Cardiovascular;;  Lt. AT, PT   RETINAL DETACHMENT SURGERY     sclerotherapy vein leg     VASCULAR SURGERY     VIDEO  BRONCHOSCOPY WITH ENDOBRONCHIAL NAVIGATION Bilateral 02/12/2021   Procedure: VIDEO BRONCHOSCOPY WITH ENDOBRONCHIAL NAVIGATION;  Surgeon: Josephine Igo, DO;  Location: [facility];  Service: Pulmonary;  Laterality: Bilateral;  ION   VIDEO BRONCHOSCOPY WITH RADIAL ENDOBRONCHIAL ULTRASOUND  02/12/2021   Procedure: RADIAL ENDOBRONCHIAL ULTRASOUND;  Surgeon: Josephine Igo, DO;  Location: [facility];  Service: Pulmonary;;   vitreous retinal surgery      FAMILY HISTORY: Family History  Problem Relation Age of Onset   Alzheimer's disease Mother    Pneumonia Father        aspiration pneumonia    SOCIAL HISTORY: Social History   Socioeconomic History   Marital status: Married    Spouse name: Dorinda Hill   Number of children: Not on file   Years of education: Not on file   Highest education level: Not on file  Occupational History   Occupation: RETIRED  Tobacco Use   Smoking status: Former    Current packs/day: 0.00    Average packs/day: 1.5 packs/day for 50.0 years (75.1 ttl pk-yrs)    Types: Cigarettes    Start date: 21    Quit date: 10/06/2016    Years since quitting: 6.4   Smokeless tobacco: Never  Vaping Use   Vaping status: Never Used  Substance and Sexual Activity   Alcohol use: Yes    Alcohol/week: 28.0 standard drinks of alcohol    Types: 28 Glasses of wine per week    Comment: 4 glasses wine a night   Drug use: Not Currently   Sexual activity: Not Currently  Other Topics Concern   Not on file  Social History Narrative   Right handed,    Lives with husband   Drinks 2 cups caffeine daily   Social Determinants of Health   Financial Resource Strain: Not on file  Food Insecurity: Not on  file  Transportation Needs: Not on file  Physical Activity: Not on file  Stress: Not on file  Social Connections: Unknown (11/19/2021)   Received from [facility]   Social Network    Social Network: Not on file  Intimate Partner Violence: Unknown (10/11/2021)    Received from [facility]   HITS    Physically Hurt: Not on file    Insult or Talk Down To: Not on file    Threaten Physical Harm: Not on file    Scream or Curse: Not on file     I spent 40 minutes of face-to-face and non-face-to-face time with patient and husband.  This included previsit chart review, lab review, study review, order entry, electronic health record documentation, patient education and discussion regarding above diagnoses and treatment plan and answered all other questions to patient and husbands satisfaction  Ihor Austin, AGNP-BC  [facility] [address] [place], [place] [address]  Phone [phone] Fax [phone] Note: This document was prepared with digital dictation and possible smart phrase technology. Any transcriptional errors that result from this process are unintentional.

## 2023-03-23 ENCOUNTER — Telehealth: Payer: Self-pay | Admitting: Adult Health

## 2023-03-23 ENCOUNTER — Ambulatory Visit (INDEPENDENT_AMBULATORY_CARE_PROVIDER_SITE_OTHER): Payer: Medicare Other | Admitting: Adult Health

## 2023-03-23 ENCOUNTER — Encounter: Payer: Self-pay | Admitting: Adult Health

## 2023-03-23 VITALS — BP 151/74 | HR 100 | Ht 62.0 in | Wt 94.2 lb

## 2023-03-23 DIAGNOSIS — G5702 Lesion of sciatic nerve, left lower limb: Secondary | ICD-10-CM

## 2023-03-23 DIAGNOSIS — M792 Neuralgia and neuritis, unspecified: Secondary | ICD-10-CM

## 2023-03-23 NOTE — Patient Instructions (Addendum)
Your Plan:  Continue gabapentin 800mg  three times daily - try to take bedtime dose of gabapentin 1-2 hours before bed  Trial different types of pillows to use at night to help alleviate pressure off your left side  Will refer you to pain management clinic for further pain management recommendations     Follow up after evaluation with pain management or if needed sooner       Thank you for coming to see [place] at [facility]. I hope we have been able to provide you high quality care today.  You may receive a patient satisfaction survey over the next few weeks. We would appreciate your feedback and comments so that we may continue to improve ourselves and the health of our patients.

## 2023-03-23 NOTE — Telephone Encounter (Signed)
Referral for pain clinic fax to [facility]. Phone: [phone],Fax: [phone]

## 2023-03-24 ENCOUNTER — Encounter: Payer: Self-pay | Admitting: Pulmonary Disease

## 2023-03-24 ENCOUNTER — Ambulatory Visit (INDEPENDENT_AMBULATORY_CARE_PROVIDER_SITE_OTHER): Payer: Medicare Other | Admitting: Pulmonary Disease

## 2023-03-24 VITALS — BP 140/70 | HR 97 | Ht 62.0 in | Wt 93.8 lb

## 2023-03-24 DIAGNOSIS — R0602 Shortness of breath: Secondary | ICD-10-CM | POA: Diagnosis not present

## 2023-03-24 DIAGNOSIS — R918 Other nonspecific abnormal finding of lung field: Secondary | ICD-10-CM

## 2023-03-24 DIAGNOSIS — C3490 Malignant neoplasm of unspecified part of unspecified bronchus or lung: Secondary | ICD-10-CM

## 2023-03-24 DIAGNOSIS — J449 Chronic obstructive pulmonary disease, unspecified: Secondary | ICD-10-CM | POA: Diagnosis not present

## 2023-03-24 MED ORDER — ENSIFENTRINE 3 MG/2.5ML IN SUSP: 3.0000 mg | Freq: Two times a day (BID) | RESPIRATORY_TRACT | 1 refills | Status: DC

## 2023-03-24 NOTE — Progress Notes (Signed)
Ct                Beth Daniels    [id number]    Jan 11, 1946  Primary Care Physician:Russo, Jonny Ruiz, MD  Referring Physician: [name], MD [address] [place],  [place] [address]  Chief complaint:   Patient in for follow-up for obstructive lung disease  HPI:  Recently completed treatment for presumed lung cancer right lower lobe nodule  History significant for breast cancer  Still with shortness of breath with activities About 30 step limited to activities  Did have a repeat CT scan which showed multiple new nodules -This was reviewed with the patient during the visit today  She is compliant with Trelegy -Uses nebulization treatments about twice a day  Usually with a cough sputum production in the morning, clear to yellow Does have a lot of nasal stuffiness and congestion as well.  She was following up at [facility] were sent over which were reviewed  Chronically on oxygen supplementation  Follow-up CT for recent CT and PET scan findings She does have an appointment to follow-up with radiation oncology  Was unable to perform a PFT in the past  She does have advanced chronic obstructive pulmonary disease, extensive emphysema  Tolerated rehab well for broken hip  She is using oxygen supplementation at about 3 L, mostly at night and more frequently following a recent fall  Has been complaining of some left lower thigh pain-being worked up  She does have peripheral arterial disease, significant peripheral neuropathy History of breast cancer-right breast in 2013  Compliant with nebulization use with albuterol and MDI use On Anoro long-term  Reformed smoker  Limited with activities  Outpatient Encounter Medications as of 03/24/2023  Medication Sig   albuterol (PROVENTIL) (2.5 MG/3ML) 0.083% nebulizer solution INHALE CONTENTS OF 1 VIAL VIA NEBULIZER EVERY 4 HOURS AS NEEDED FOR WHEEZING OR SHORTNESS OF BREATH   albuterol (VENTOLIN HFA) 108 (90 Base) MCG/ACT  inhaler USE 1 INHALATION EVERY 6 HOURS AS NEEDED FOR WHEEZING OR SHORTNESS OF BREATH   aspirin EC 81 MG tablet Take 81 mg by mouth daily.   atorvastatin (LIPITOR) 40 MG tablet    azelastine (ASTELIN) 0.1 % nasal spray USE 1 SPRAY IN EACH NOSTRIL TWICE A DAY   Azelastine-Fluticasone 137-50 MCG/ACT SUSP Place 1 spray into the nose daily.   conjugated estrogens (PREMARIN) vaginal cream Place 1 Applicatorful vaginally every Monday, Wednesday, and Friday at 8 PM.   Cranberry 400 MG CAPS Take 1,200 mg by mouth daily at 2 PM.   denosumab (PROLIA) 60 MG/ML SOSY injection Inject 60 mg into the skin every 6 (six) months.    docusate sodium (COLACE) 100 MG capsule Take 1 capsule (100 mg total) by mouth 2 (two) times daily. (Patient taking differently: Take 100 mg by mouth at bedtime.)   Ferrous Fumarate (HEMOCYTE - 106 MG FE) 324 (106 Fe) MG TABS tablet Take 1 tablet by mouth daily at 3 pm.    fluticasone (FLONASE) 50 MCG/ACT nasal spray USE 2 SPRAYS IN EACH NOSTRIL TWICE A DAY   Fluticasone-Umeclidin-Vilant (TRELEGY ELLIPTA) 100-62.5-25 MCG/ACT AEPB Inhale 1 puff into the lungs daily at 6 (six) AM.   folic acid (FOLVITE) 1 MG tablet Take 1 mg by mouth daily.   gabapentin (NEURONTIN) 100 MG capsule TAKE 2 CAPSULES THREE TIMES A DAY   gabapentin (NEURONTIN) 300 MG capsule TAKE 2 CAPSULES THREE TIMES A DAY   guaiFENesin (MUCINEX) 600 MG 12 hr tablet Take 600 mg  by mouth 2 (two) times daily.   hydrocortisone 2.5 % cream Apply 1 application topically daily.   Multiple Vitamin (MULTI-VITAMIN) tablet Take 1 tablet by mouth daily.    mupirocin ointment (BACTROBAN) 2 % Apply 1 application topically daily as needed (wound care).    MYRBETRIQ 50 MG TB24 tablet Take 50 mg by mouth at bedtime.   nicotine polacrilex (COMMIT) 4 MG lozenge Take 4 mg by mouth as needed for smoking cessation.    OXYGEN Inhale 5 L into the lungs continuous.   pantoprazole (PROTONIX) 40 MG tablet Take 40 mg by mouth daily.   sodium  chloride (OCEAN) 0.65 % SOLN nasal spray Place 1 spray into both nostrils 2 (two) times daily. As needed during the day   Vitamin D, Cholecalciferol, 25 MCG (1000 UT) TABS Take 1,000 Units by mouth daily.   No facility-administered encounter medications on file as of 03/24/2023.    Allergies as of 03/24/2023 - Review Complete 03/24/2023  Allergen Reaction Noted   Duloxetine hcl Other (See Comments) 08/18/2022    Past Medical History:  Diagnosis Date   Alcohol abuse    Anemia    Cancer (HCC)    breast right   Chronic hyponatremia    COPD (chronic obstructive pulmonary disease) (HCC)    DCIS (ductal carcinoma in situ)    Dyspnea    Emphysema of lung (HCC)    Lung cancer (HCC)    has received 2 treatments of radiation   Osteoporosis    Overactive bladder    PVD (peripheral vascular disease) (HCC)    Tobacco use     Past Surgical History:  Procedure Laterality Date   ABDOMINAL AORTOGRAM W/LOWER EXTREMITY N/A 03/15/2019   Procedure: ABDOMINAL AORTOGRAM W/LOWER EXTREMITY;  Surgeon: Nada Libman, MD;  Location: [facility];  Service: Cardiovascular;  Laterality: N/A;   aeptoplasty     BREAST SURGERY     BRONCHIAL BRUSHINGS  02/12/2021   Procedure: BRONCHIAL BRUSHINGS;  Surgeon: Josephine Igo, DO;  Location: [facility];  Service: Pulmonary;;   BRONCHIAL NEEDLE ASPIRATION BIOPSY  02/12/2021   Procedure: BRONCHIAL NEEDLE ASPIRATION BIOPSIES;  Surgeon: Josephine Igo, DO;  Location: [facility];  Service: Pulmonary;;   cartaract extraction     EYE SURGERY Bilateral    Cataract in R & L eye and Retina surgery after cataract surgery in L eye   FIDUCIAL MARKER PLACEMENT  02/12/2021   Procedure: FIDUCIAL MARKER PLACEMENT;  Surgeon: Josephine Igo, DO;  Location: [facility];  Service: Pulmonary;;   INTRAMEDULLARY (IM) NAIL INTERTROCHANTERIC Left 01/30/2019   Procedure: INTRAMEDULLARY (IM) NAIL INTERTROCHANTRIC;  Surgeon: Gean Birchwood, MD;  Location: [facility];  Service:  Orthopedics;  Laterality: Left;   IR FIBRIN GLUE REPAIR ANAL FISTULA     MASTECTOMY     s/p bl   PERIPHERAL VASCULAR INTERVENTION  03/15/2019   Procedure: PERIPHERAL VASCULAR INTERVENTION;  Surgeon: Nada Libman, MD;  Location: [facility];  Service: Cardiovascular;;  Lt. SFA   PERIPHERAL VASCULAR THROMBECTOMY  03/15/2019   Procedure: PERIPHERAL VASCULAR THROMBECTOMY;  Surgeon: Nada Libman, MD;  Location: [facility];  Service: Cardiovascular;;  Lt. AT, PT   RETINAL DETACHMENT SURGERY     sclerotherapy vein leg     VASCULAR SURGERY     VIDEO BRONCHOSCOPY WITH ENDOBRONCHIAL NAVIGATION Bilateral 02/12/2021   Procedure: VIDEO BRONCHOSCOPY WITH ENDOBRONCHIAL NAVIGATION;  Surgeon: Josephine Igo, DO;  Location: [facility];  Service: Pulmonary;  Laterality: Bilateral;  ION   VIDEO BRONCHOSCOPY WITH RADIAL ENDOBRONCHIAL ULTRASOUND  02/12/2021   Procedure: RADIAL ENDOBRONCHIAL ULTRASOUND;  Surgeon: Josephine Igo, DO;  Location: [facility];  Service: Pulmonary;;   vitreous retinal surgery      Family History  Problem Relation Age of Onset   Alzheimer's disease Mother    Pneumonia Father        aspiration pneumonia    Social History   Socioeconomic History   Marital status: Married    Spouse name: Dorinda Hill   Number of children: Not on file   Years of education: Not on file   Highest education level: Not on file  Occupational History   Occupation: RETIRED  Tobacco Use   Smoking status: Former    Current packs/day: 0.00    Average packs/day: 1.5 packs/day for 50.0 years (75.1 ttl pk-yrs)    Types: Cigarettes    Start date: 59    Quit date: 10/06/2016    Years since quitting: 6.4   Smokeless tobacco: Never  Vaping Use   Vaping status: Never Used  Substance and Sexual Activity   Alcohol use: Yes    Alcohol/week: 28.0 standard drinks of alcohol    Types: 28 Glasses of wine per week    Comment: 4 glasses wine a night   Drug use: Not Currently   Sexual  activity: Not Currently  Other Topics Concern   Not on file  Social History Narrative   Right handed,    Lives with husband   Drinks 2 cups caffeine daily   Social Determinants of Health   Financial Resource Strain: Not on file  Food Insecurity: Not on file  Transportation Needs: Not on file  Physical Activity: Not on file  Stress: Not on file  Social Connections: Unknown (11/19/2021)   Received from [facility]   Social Network    Social Network: Not on file  Intimate Partner Violence: Unknown (10/11/2021)   Received from [facility], [facility]   HITS    Physically Hurt: Not on file    Insult or Talk Down To: Not on file    Threaten Physical Harm: Not on file    Scream or Curse: Not on file    Review of Systems  Constitutional:  Positive for fatigue.  HENT: Negative.    Respiratory:  Positive for shortness of breath.   Cardiovascular:  Negative for chest pain and leg swelling.  Gastrointestinal: Negative.   Endocrine: Negative.   Genitourinary: Negative.   All other systems reviewed and are negative.   Vitals:   03/24/23 1405  BP: (!) 140/70  Pulse: 97  SpO2: 100%     Physical Exam Constitutional:      Appearance: Normal appearance. She is well-developed.  HENT:     Head: Normocephalic and atraumatic.     Mouth/Throat:     Mouth: Mucous membranes are moist.  Eyes:     General:        Right eye: No discharge.        Left eye: No discharge.     Pupils: Pupils are equal, round, and reactive to light.  Cardiovascular:     Rate and Rhythm: Normal rate and regular rhythm.  Pulmonary:     Effort: Pulmonary effort is normal. No respiratory distress.     Breath sounds: Normal breath sounds. No stridor. No wheezing or rales.  Neurological:     Mental Status: She is alert.  Psychiatric:  Mood and Affect: Mood normal.    Data Reviewed: Records from [facility] reviewed CT scan from 11/07/2019 reviewed showing stable lung nodules CT scan  reviewed today 08/03/2020  Could not perform PFT previously Assessment:  Chronic obstructive pulmonary disease -Does not appear to be exacerbated   multiple lung nodules -Likely metastatic lung disease  Chronic hypoxemic respiratory failure - Continuing on oxygen supplementation  Panlobular emphysema  Significant debility  Recent fractured femur  Multiple lung nodules  Recently treated for lung cancer with stereotactic radiation -Has follow-up with radiation oncology  Plan/Recommendations:  Follow-up in 3 months  Continue Trelegy  Continue nebulization treatment  Will add Ensifentrine to nebulization treatment twice a day  Exercise as tolerated  Continues to use about 5 L of oxygen with activity  Encouraged to call with any significant concerns  Virl Diamond MD [facility] 03/24/2023, 2:18 PM  CC: [name], MD

## 2023-03-24 NOTE — Telephone Encounter (Signed)
Received confirmation fax from them that they are going to reach out to the patient to schedule.

## 2023-03-24 NOTE — Patient Instructions (Signed)
Continue Trelegy  Prescription for Ensifentrine sent to pharmacy -It is a new nebulization treatment that can be added onto other medications for COPD  Your CAT scan did show new nodules as we looked over today -Make sure you follow-up with radiation oncology  I will see you in 3 months  Continue graded activities as tolerated  Call us with significant concerns

## 2023-03-25 ENCOUNTER — Telehealth: Payer: Self-pay | Admitting: *Deleted

## 2023-03-25 NOTE — Telephone Encounter (Signed)
Called patient to inform of CT for 05-21-23- arrival time- 12:30 pm @ [facility], patient to have water only- 4 hrs. prior to test, patient to receive results from Laurence Aly on 05-25-23 @ 1:30 pm via telephone, spoke with patient and she is aware of these appts. and the instructions

## 2023-03-25 NOTE — Telephone Encounter (Signed)
Patient is scheduled with Beth Daniels for 10/8 @ 3:00 pm.

## 2023-03-30 ENCOUNTER — Encounter: Payer: Self-pay | Admitting: Pulmonary Disease

## 2023-04-17 MED ORDER — ENSIFENTRINE 3 MG/2.5ML IN SUSP: 3.0000 mg | Freq: Two times a day (BID) | RESPIRATORY_TRACT | 0 refills | Status: DC

## 2023-04-17 NOTE — Addendum Note (Signed)
Addended by: Jama Flavors on: 04/17/2023 08:10 AM   Modules accepted: Orders

## 2023-05-21 ENCOUNTER — Ambulatory Visit (HOSPITAL_COMMUNITY)
Admission: RE | Admit: 2023-05-21 | Discharge: 2023-05-21 | Disposition: A | Discharge status: Home / Self Care | Payer: Medicare Other | Source: Ambulatory Visit | Attending: Radiation Oncology | Admitting: Radiation Oncology

## 2023-05-21 ENCOUNTER — Other Ambulatory Visit: Payer: Self-pay | Admitting: Pulmonary Disease

## 2023-05-21 DIAGNOSIS — C3431 Malignant neoplasm of lower lobe, right bronchus or lung: Secondary | ICD-10-CM | POA: Diagnosis present

## 2023-05-21 DIAGNOSIS — C3432 Malignant neoplasm of lower lobe, left bronchus or lung: Secondary | ICD-10-CM | POA: Insufficient documentation

## 2023-05-21 LAB — POCT I-STAT CREATININE: Creatinine, Ser: 0.4 mg/dL — ABNORMAL LOW (ref 0.44–1.00)

## 2023-05-21 MED ORDER — IOHEXOL 300 MG/ML  SOLN
75.0000 mL | Freq: Once | INTRAMUSCULAR | Status: AC | PRN
  Administered 2023-05-21: 75 mL via INTRAVENOUS

## 2023-05-25 ENCOUNTER — Encounter: Payer: Self-pay | Admitting: Radiation Oncology

## 2023-05-25 ENCOUNTER — Ambulatory Visit
Admission: RE | Admit: 2023-05-25 | Discharge: 2023-05-25 | Disposition: A | Discharge status: Home / Self Care | Payer: Medicare Other | Source: Ambulatory Visit | Attending: Radiation Oncology | Admitting: Radiation Oncology

## 2023-05-25 VITALS — BP 138/59 | HR 70 | Temp 98.1°F | Resp 16 | Ht 62.0 in | Wt 96.0 lb

## 2023-05-25 DIAGNOSIS — C3432 Malignant neoplasm of lower lobe, left bronchus or lung: Secondary | ICD-10-CM

## 2023-05-25 DIAGNOSIS — C3431 Malignant neoplasm of lower lobe, right bronchus or lung: Secondary | ICD-10-CM

## 2023-05-25 NOTE — Patient Instructions (Addendum)
Please follow up with [facility] to have Ensifentrine filled and begin this. If you have any trouble check back with Dr. Wynona Neat.    Check your BP 3 times a day, sit for about 5 minutes before checking, and report 2-3 days of recordings to Dr. Timothy Lasso.  Talbert Forest from our office will call to schedule your next CT scan between mid February and mid March 2025.

## 2023-05-25 NOTE — Progress Notes (Signed)
Radiation Oncology         [phone] ________________________________  Initial Outpatient Follow Up - Conducted via telephone at patient request.  I spoke with the patient to conduct this consult visit via telephone. The patient was notified in advance and was offered an in person or telemedicine meeting to allow for face to face communication but instead preferred to proceed with a telephone visit.   Name: Beth Daniels        MRN: [id number]  Date of Service: 05/25/2023 DOB: June 02, 1946  WU:[id number], Jonny Ruiz, MD  [name], MD     REFERRING PHYSICIAN: [name], MD   DIAGNOSIS: The primary encounter diagnosis was Malignant neoplasm of lower lobe of left lung (HCC). A diagnosis of Malignant neoplasm of lower lobe of right lung [facility]) was also pertinent to this visit.   HISTORY OF PRESENT ILLNESS: Beth Daniels is a 77 y.o. female with a diagnosis of Putative stage I lung cancer of the left lower lobe.  She has a remote  history of right breast DCIS greater than 10 years ago which was treated with bilateral mastectomy.  She has emphysema with continuous oxygen due to hypoxemic respiratory failure. Multiple scans have been followed by pulmonary and while 3 nodules increased in size, a PET in August 2022 did not show hypermetabolic change. She did undergo bronchoscopy in the summer 2022 and all the nodules that were sampled were negative for disease and remained suspicious in behavior. She was not a good candidate for biopsy and went on to receive definitive SBRT to the LLL which she completed in March 2023.  A CT scan of the chest on 05/14/22 showed stable post treatment change in the LLL region near the lingula. Stable nodules remain in other lobes. Stigmata of atherosclerotic changes, emphysematous changes of the lungs, and stable nodule in the right adrenal gland remained consistent with adenoma. She also had what appears to be dilated ventricle and borderline cardiac enlargement in addition to a  stable pericardial cystic structure.  She did have a positive Hemoccult when we had originally met with her in 2023 but medically she was unable to go for colonoscopy, it is not clear if there is further plan for this in the future. Scans that have been done in January 2024 had shown new spiculation in the nodule that had been followed in the right lower lobe and PET scan on 08/26/2022 showed hypermetabolic activity in this nodule with an SUV of 3.9.  This was described as right lower lobe in the body of the report but upper lobe and the impression.  She underwent treatment of the right lower lobe nodule with radiotherapy.  Post treatment CT scans have been reassuring though she's had some post radiation inflammatory changes over time that have waxed and waned. When she was seen by her pulmonary team in September, she was using between 3 to 5 L of oxygen continuously depending on her activity.  Dr. Wynona Neat plans to see her in about a month.  Her most recent CT of the chest on 05/21/2023 showed new masslike focus of consolidation in the anterior right lower lobe measuring 3.6 cm.  Otherwise the numerous additional solid pulmonary nodules were scattered in both lungs and were felt to be either stable or decreased.  She did have a new mildly enlarged right hilar lymph node measuring 1 cm.  A chronic moderate T12 vertebral compression fracture and T3 vertebral compression fracture were unchanged, mild thoracic spondylosis was noted.  She has stigmata of coronary and aortic atherosclerosis and emphysema.  Of note Dr. Mitzi Hansen personally reviewed her films and recommended short interval follow-up CT.  She is contacted today to review these recommendations.   PREVIOUS RADIATION THERAPY:   09/11/22 -09/24/22 SBRT Treatment The tumor in the RLL was treated with a course of stereotactic body radiation treatment. The patient received 54 Gy in 3 fractions at 18 G per fraction.  09/10/21-09/17/21 SBRT Treatment The tumor in  the LLL was treated with a course of stereotactic body radiation treatment. The patient received 54 Gy In 3 fractions at 18 G per fraction.  PAST MEDICAL HISTORY:  Past Medical History:  Diagnosis Date   Alcohol abuse    Anemia    Cancer (HCC)    breast right   Chronic hyponatremia    COPD (chronic obstructive pulmonary disease) (HCC)    DCIS (ductal carcinoma in situ)    Dyspnea    Emphysema of lung (HCC)    Lung cancer (HCC)    has received 2 treatments of radiation   Osteoporosis    Overactive bladder    PVD (peripheral vascular disease) (HCC)    Tobacco use        PAST SURGICAL HISTORY: Past Surgical History:  Procedure Laterality Date   ABDOMINAL AORTOGRAM W/LOWER EXTREMITY N/A 03/15/2019   Procedure: ABDOMINAL AORTOGRAM W/LOWER EXTREMITY;  Surgeon: Nada Libman, MD;  Location: [facility];  Service: Cardiovascular;  Laterality: N/A;   aeptoplasty     BREAST SURGERY     BRONCHIAL BRUSHINGS  02/12/2021   Procedure: BRONCHIAL BRUSHINGS;  Surgeon: Josephine Igo, DO;  Location: [facility];  Service: Pulmonary;;   BRONCHIAL NEEDLE ASPIRATION BIOPSY  02/12/2021   Procedure: BRONCHIAL NEEDLE ASPIRATION BIOPSIES;  Surgeon: Josephine Igo, DO;  Location: [facility];  Service: Pulmonary;;   cartaract extraction     EYE SURGERY Bilateral    Cataract in R & L eye and Retina surgery after cataract surgery in L eye   FIDUCIAL MARKER PLACEMENT  02/12/2021   Procedure: FIDUCIAL MARKER PLACEMENT;  Surgeon: Josephine Igo, DO;  Location: [facility];  Service: Pulmonary;;   INTRAMEDULLARY (IM) NAIL INTERTROCHANTERIC Left 01/30/2019   Procedure: INTRAMEDULLARY (IM) NAIL INTERTROCHANTRIC;  Surgeon: Gean Birchwood, MD;  Location: [facility];  Service: Orthopedics;  Laterality: Left;   IR FIBRIN GLUE REPAIR ANAL FISTULA     MASTECTOMY     s/p bl   PERIPHERAL VASCULAR INTERVENTION  03/15/2019   Procedure: PERIPHERAL VASCULAR INTERVENTION;  Surgeon: Nada Libman, MD;   [facility]: [facility];  Service: Cardiovascular;;  Lt. SFA   PERIPHERAL VASCULAR THROMBECTOMY  03/15/2019   Procedure: PERIPHERAL VASCULAR THROMBECTOMY;  Surgeon: Nada Libman, MD;  [facility];  Service: Cardiovascular;;  Lt. AT, PT   RETINAL DETACHMENT SURGERY     sclerotherapy vein leg     VASCULAR SURGERY     VIDEO BRONCHOSCOPY WITH ENDOBRONCHIAL NAVIGATION Bilateral 02/12/2021   Procedure: VIDEO BRONCHOSCOPY WITH ENDOBRONCHIAL NAVIGATION;  Surgeon: Josephine Igo, DO;  Location: [facility];  Service: Pulmonary;  Laterality: Bilateral;  ION   VIDEO BRONCHOSCOPY WITH RADIAL ENDOBRONCHIAL ULTRASOUND  02/12/2021   Procedure: RADIAL ENDOBRONCHIAL ULTRASOUND;  Surgeon: Josephine Igo, DO;  Location: [facility];  Service: Pulmonary;;   vitreous retinal surgery       FAMILY HISTORY:  Family History  Problem Relation Age of Onset   Alzheimer's disease Mother    Pneumonia Father  aspiration pneumonia     SOCIAL HISTORY:  reports that she quit smoking about 6 years ago. Her smoking use included cigarettes. She started smoking about 54 years ago. She has a 75.1 pack-year smoking history. She has never used smokeless tobacco. She reports current alcohol use of about 28.0 standard drinks of alcohol per week. She reports that she does not currently use drugs. The patient is married and lives in [place] and she is a retired from working in [employer], political [employer]. She's accompanied by her husband and sister.    ALLERGIES: Duloxetine hcl   MEDICATIONS:  Current Outpatient Medications  Medication Sig Dispense Refill   albuterol (PROVENTIL) (2.5 MG/3ML) 0.083% nebulizer solution INHALE CONTENTS OF 1 VIAL VIA NEBULIZER EVERY 4 HOURS AS NEEDED FOR WHEEZING OR SHORTNESS OF BREATH 360 mL 17   albuterol (VENTOLIN HFA) 108 (90 Base) MCG/ACT inhaler USE 1 INHALATION EVERY 6 HOURS AS NEEDED FOR WHEEZING OR SHORTNESS OF BREATH 17 g 3   aspirin EC 81 MG tablet Take 81 mg by  mouth daily.     atorvastatin (LIPITOR) 40 MG tablet      azelastine (ASTELIN) 0.1 % nasal spray USE 1 SPRAY IN EACH NOSTRIL TWICE A DAY 30 mL 6   Azelastine-Fluticasone 137-50 MCG/ACT SUSP Place 1 spray into the nose daily. 23 g 6   conjugated estrogens (PREMARIN) vaginal cream Place 1 Applicatorful vaginally every Monday, Wednesday, and Friday at 8 PM.     Cranberry 400 MG CAPS Take 1,200 mg by mouth daily at 2 PM.     denosumab (PROLIA) 60 MG/ML SOSY injection Inject 60 mg into the skin every 6 (six) months.      docusate sodium (COLACE) 100 MG capsule Take 1 capsule (100 mg total) by mouth 2 (two) times daily. (Patient taking differently: Take 100 mg by mouth at bedtime.) 10 capsule 0   Ensifentrine 3 MG/2.5ML SUSP Inhale 3 mg into the lungs 2 (two) times daily. 450 mL 0   Ferrous Fumarate (HEMOCYTE - 106 MG FE) 324 (106 Fe) MG TABS tablet Take 1 tablet by mouth daily at 3 pm.      fluticasone (FLONASE) 50 MCG/ACT nasal spray USE 2 SPRAYS IN EACH NOSTRIL TWICE A DAY 48 g 7   Fluticasone-Umeclidin-Vilant (TRELEGY ELLIPTA) 100-62.5-25 MCG/ACT AEPB Inhale 1 puff into the lungs daily at 6 (six) AM. 180 each 3   folic acid (FOLVITE) 1 MG tablet Take 1 mg by mouth daily.     gabapentin (NEURONTIN) 100 MG capsule TAKE 2 CAPSULES THREE TIMES A DAY 540 capsule 3   gabapentin (NEURONTIN) 300 MG capsule TAKE 2 CAPSULES THREE TIMES A DAY 540 capsule 3   guaiFENesin (MUCINEX) 600 MG 12 hr tablet Take 600 mg by mouth 2 (two) times daily.     hydrocortisone 2.5 % cream Apply 1 application topically daily.     Multiple Vitamin (MULTI-VITAMIN) tablet Take 1 tablet by mouth daily.      mupirocin ointment (BACTROBAN) 2 % Apply 1 application topically daily as needed (wound care).      MYRBETRIQ 50 MG TB24 tablet Take 50 mg by mouth at bedtime.     nicotine polacrilex (COMMIT) 4 MG lozenge Take 4 mg by mouth as needed for smoking cessation.      OXYGEN Inhale 5 L into the lungs continuous.     pantoprazole  (PROTONIX) 40 MG tablet Take 40 mg by mouth daily.     sodium chloride (OCEAN) 0.65 % SOLN nasal spray  Place 1 spray into both nostrils 2 (two) times daily. As needed during the day     Vitamin D, Cholecalciferol, 25 MCG (1000 UT) TABS Take 1,000 Units by mouth daily.     No current facility-administered medications for this encounter.     REVIEW OF SYSTEMS: On review of systems the patient reports that she is doing okay overall. She's not been able to get her Ensifentrine filled by her pharmacy and reports she is now using O2 at 6L continuously. She denies any recent fevers or known lung infections, or upper respiratory illness, but she has had more productive mucous. She still uses her albuterol nebulizer twice daily and initially feels like her breathing is better after using it but can't tell a difference overall in her breathing. No other complaints are verbalized.          PHYSICAL EXAM:  Wt Readings from Last 3 Encounters:  05/25/23 90 lb (40.8 kg)  03/24/23 93 lb 12.8 oz (42.5 kg)  03/23/23 94 lb 3.2 oz (42.7 kg)   Temp Readings from Last 3 Encounters:  05/25/23 98.1 F (36.7 C) (Temporal)  03/13/23 (!) 97.4 F (36.3 C) (Oral)  02/16/23 97.7 F (36.5 C) (Temporal)   BP Readings from Last 3 Encounters:  05/25/23 (!) 138/59  03/24/23 (!) 140/70  03/23/23 (!) 151/74   Pulse Readings from Last 3 Encounters:  05/25/23 70  03/24/23 97  03/23/23 100       ECOG = 1  0 - Asymptomatic (Fully active, able to carry on all predisease activities without restriction)  1 - Symptomatic but completely ambulatory (Restricted in physically strenuous activity but ambulatory and able to carry out work of a light or sedentary nature. For example, light housework, office work)  2 - Symptomatic, <50% in bed during the day (Ambulatory and capable of all self care but unable to carry out any work activities. Up and about more than 50% of waking hours)  3 - Symptomatic, >50% in bed, but  not bedbound (Capable of only limited self-care, confined to bed or chair 50% or more of waking hours)  4 - Bedbound (Completely disabled. Cannot carry on any self-care. Totally confined to bed or chair)  5 - Death   Santiago Glad MM, Creech RH, Tormey DC, et al. [phone]). "Toxicity and response criteria of the [facility]". Am. Evlyn Clines. Oncol. 5 (6): 649-55    LABORATORY DATA:  Lab Results  Component Value Date   WBC 3.8 (L) 05/22/2022   HGB 11.2 (L) 05/22/2022   HCT 33.5 (L) 05/22/2022   MCV 103.6 (H) 05/22/2022   PLT 142.0 (L) 05/22/2022   Lab Results  Component Value Date   NA 136 05/22/2022   K 4.7 05/22/2022   CL 92 (L) 05/22/2022   CO2 39 (H) 05/22/2022   Lab Results  Component Value Date   ALT 17 10/01/2021   AST 33 10/01/2021   ALKPHOS 70 10/01/2021   BILITOT 0.4 10/01/2021      RADIOGRAPHY: CT CHEST W CONTRAST  Result Date: 05/23/2023 CLINICAL DATA:  Left lower lobe non-small cell lung cancer status post radiation therapy. Restaging. Breast cancer. * Tracking Code: BO * EXAM: CT CHEST WITH CONTRAST TECHNIQUE: Multidetector CT imaging of the chest was performed during intravenous contrast administration. RADIATION DOSE REDUCTION: This exam was performed according to the departmental dose-optimization program which includes automated exposure control, adjustment of the mA and/or kV according to patient size and/or use of iterative reconstruction technique.  CONTRAST:  75mL OMNIPAQUE IOHEXOL 300 MG/ML  SOLN COMPARISON:  02/11/2023 chest CT. FINDINGS: Cardiovascular: Normal heart size. No significant pericardial effusion/thickening. Left anterior descending coronary atherosclerosis. Atherosclerotic nonaneurysmal thoracic aorta. Normal caliber pulmonary arteries. No central pulmonary emboli. Mediastinum/Nodes: No significant thyroid nodules. Unremarkable esophagus. No axillary or mediastinal adenopathy. No left hilar adenopathy. Newly mildly enlarged 1.0 cm right  hilar node (series 2/image 80). Lungs/Pleura: No pneumothorax. No pleural effusion. Severe centrilobular emphysema with diffuse bronchial wall thickening. New irregular masslike focus of consolidation in the anterior right lower lobe measuring 3.6 x 2.3 cm (series 5/image 74). All of the numerous additional irregular solid pulmonary nodules scattered in both lungs are stable or decreased in size. Anteromedial right upper lobe 1.0 cm solid pulmonary nodule (series 5/image 53) and medial superior segment right lower lobe 1.1 cm solid nodule (series 5/image 46), both stable. Right upper lobe irregular 1.5 x 0.7 cm nodule (series 5/image 67), decreased from 2.2 x 1.2 cm. Peripheral superior segment right lower lobe 0.8 x 0.5 cm nodule (series 5/image 58), slightly decreased from 0.8 x 0.6 cm. Posterior lingular 2.1 x 1.0 cm irregular solid nodule (series 5/image 102), decreased from 2.5 x 1.8 cm using similar measurement technique. Superior segment left lower lobe irregular solid 0.9 x 0.6 cm nodule (series 5/image 59), decreased from 1.3 x 1.2 cm. Anterior left lower lobe irregular solid 1.3 x 0.7 cm nodule (series 5/image 105), decreased from 2.2 x 1.3 cm. Upper abdomen: No acute abnormality. Musculoskeletal: No aggressive appearing focal osseous lesions. Chronic moderate T12 vertebral compression fracture is unchanged. Chronic mild superior T3 vertebral compression fractures unchanged. Mild thoracic spondylosis. IMPRESSION: 1. New irregular masslike focus of consolidation in the anterior right lower lobe measuring 3.6 x 2.3 cm, equivocal for pulmonary infection versus new site of malignancy. New mild right hilar adenopathy, indeterminate for reactive versus metastatic node. Suggest attention on short-term follow-up chest CT with IV contrast in 3 months. 2. All of the numerous additional irregular solid pulmonary nodules scattered in both lungs are stable or decreased in size, as detailed. 3. One vessel coronary  atherosclerosis. 4. Aortic Atherosclerosis (ICD10-I70.0) and Emphysema (ICD10-J43.9). Electronically Signed   By: Delbert Phenix M.D.   On: 05/23/2023 23:48       IMPRESSION/PLAN: 1. Putative Stage IA1, cT1aN0M0, NSCLC of the RLL  with History of  Putative Stage IA2, cT1bN0M0, NSCLC of the LLL along the level of the lingula. We will plan to repeat imaging with a CT in 3 months time given the new findings in the RLL and hilar lymph node. These findings being noted seem consistent with her known O2 dependent lung disease and she is going to start a new prescription in her nebulizer to see if this will also help. Regarding the two targets previously treated however, Dr. Mitzi Hansen feels that these appear stable. We will continue to watch these areas as well as other known lung nodules at that interval.  2. Peripheral vascular disease. She will follow up with the vascular surgery and we will follow this expectantly. 3. O2 dependent COPD.  Patient continues under the care of of pulmonary medicine and we appreciate their input.  This encounter was conducted via telephone.  The patient has provided two factor identification and has given verbal consent for this type of encounter and has been advised to only accept a meeting of this type in a secure network environment. The time spent during this encounter was 45 minutes including preparation, discussion, and coordination of  the patient's care. The attendants for this meeting include  Ronny Bacon and EASTYN BOOMSMA, her husband Twania Simmering, and sister Loyal Buba During the encounter,   Ronny Bacon was located remotely at home ELINOR BRADO was located with her husband Avrielle Defrain and sister Loyal Buba in our clinic.      Osker Mason, [facility]   **Disclaimer: This note was dictated with voice recognition software. Similar sounding words can inadvertently be transcribed and this note may contain transcription errors which may not have been  corrected upon publication of note.**

## 2023-05-25 NOTE — Addendum Note (Signed)
Encounter addended by: Birdena Crandall, LPN on: 16/04/9603 2:36 PM  Actions taken: Vitals modified, Clinical Note Signed

## 2023-05-25 NOTE — Progress Notes (Addendum)
Patient decided to come in person to review results of most recent CT of Chest. I verified patient's identity x2 and began nursing interview.   Patient reports coughing up increasing yellow sputum, fatigue, and sob w/ exertion; that's managed with the use of 6 liters of O2. Patient denies any other related issues at this time.   Meaningful use complete.   Patient aware of their 1:30pm telephone appointment (but came in-person) w/ Laurence Aly PA-C. I left my extension [phone] in case patient needs anything. Patient verbalized understanding. This concludes the nursing interview.   Patient contact [phone]  Vitals- BP (!) 138/59 (BP Location: Left Arm, Patient Position: Sitting, Cuff Size: Small)   Pulse 70   Temp 98.1 F (36.7 C) (Temporal)   Resp 16   Ht 5\' 2"  (1.575 m)   Wt 96 lb (43.5 kg)   SpO2 98%   BMI 17.56 kg/m      Beth Favors, LPN

## 2023-05-26 ENCOUNTER — Telehealth: Payer: Self-pay | Admitting: Pulmonary Disease

## 2023-05-26 DIAGNOSIS — J449 Chronic obstructive pulmonary disease, unspecified: Secondary | ICD-10-CM

## 2023-05-26 NOTE — Telephone Encounter (Signed)
Patient needs prescription of Ensifentrine to be sent to [facility]  because [facility] does not carry it. Please refill with [facility]. [phone]

## 2023-06-02 MED ORDER — ENSIFENTRINE 3 MG/2.5ML IN SUSP
3.0000 mg | Freq: Two times a day (BID) | RESPIRATORY_TRACT | 1 refills | Status: AC
Start: 2023-06-02 — End: 2023-08-31

## 2023-06-02 NOTE — Telephone Encounter (Signed)
Rx sent to [facility] today. Unclear if paperwork was completed for referral.  Chesley Mires, PharmD, MPH, BCPS, CPP Clinical Pharmacist (Rheumatology and Pulmonology)

## 2023-06-08 NOTE — Telephone Encounter (Signed)
Received fax from [facility] that they will be unable to service [place] until [facility] enrollment form is completed. I spoke with patient. She requested enrollment form be mailed to her.  She was frustrated with process considering there was lack of follow-up from our clinic since she was seen in Sept 2024. I apologized for delay and that enrollment form was not completed  Provider portion placed in Dr. Trena Platt mailbox for signature  Chesley Mires, PharmD, MPH, BCPS, CPP Clinical Pharmacist (Rheumatology and Pulmonology)

## 2023-06-17 NOTE — Telephone Encounter (Signed)
Provider portion received, placed in "awaiting response" folder pending return of pt's portion.

## 2023-06-24 ENCOUNTER — Encounter: Payer: Self-pay | Admitting: Pulmonary Disease

## 2023-06-24 ENCOUNTER — Ambulatory Visit (INDEPENDENT_AMBULATORY_CARE_PROVIDER_SITE_OTHER): Payer: Medicare Other | Admitting: Pulmonary Disease

## 2023-06-24 ENCOUNTER — Telehealth: Payer: Self-pay | Admitting: Pulmonary Disease

## 2023-06-24 VITALS — BP 152/70 | HR 95 | Temp 98.3°F | Ht 62.0 in | Wt 93.6 lb

## 2023-06-24 DIAGNOSIS — R918 Other nonspecific abnormal finding of lung field: Secondary | ICD-10-CM

## 2023-06-24 DIAGNOSIS — J9611 Chronic respiratory failure with hypoxia: Secondary | ICD-10-CM

## 2023-06-24 DIAGNOSIS — J449 Chronic obstructive pulmonary disease, unspecified: Secondary | ICD-10-CM

## 2023-06-24 DIAGNOSIS — R9389 Abnormal findings on diagnostic imaging of other specified body structures: Secondary | ICD-10-CM

## 2023-06-24 MED ORDER — IPRATROPIUM BROMIDE 0.06 % NA SOLN: 2.0000 | Freq: Four times a day (QID) | NASAL | 6 refills | Status: DC

## 2023-06-24 NOTE — Progress Notes (Unsigned)
Ct                Beth Daniels    [id number]    28-Aug-1945  Primary Care Physician:Russo, Jonny Ruiz, MD  Referring Physician: [name], MD [address] [place],  [place] [address]  Chief complaint:   Patient in for follow-up for obstructive lung disease  HPI:  Recently completed treatment for presumed lung cancer right lower lobe nodule  History significant for breast cancer  Still with shortness of breath with activities About 30 step limited to activities  Has been using about 6 L of oxygen Activity level has remained about the same  She did have a repeat CT scan showing multiple lung nodules which are relatively stable with a new nodule in the right midlung  Remains compliant with Trelegy and nebulization treatments We are trying to get a covered for Ensifentrine  Cough with sputum production Nasal stuffiness and congestion-uses Astelin  She was following up at [facility] Records were sent over which were reviewed  Chronically on oxygen supplementation  Follow-up CT for recent CT and PET scan findings She does have an appointment to follow-up with radiation oncology  Was unable to perform a PFT in the past  She does have advanced chronic obstructive pulmonary disease, extensive emphysema  Tolerated rehab well for broken hip  Has been complaining of some left lower thigh pain-being worked up  She does have peripheral arterial disease, significant peripheral neuropathy History of breast cancer-right breast in 2013  Was Anoro previously Reformed smoker  Limited with activities  Outpatient Encounter Medications as of 06/24/2023  Medication Sig   albuterol (PROVENTIL) (2.5 MG/3ML) 0.083% nebulizer solution INHALE CONTENTS OF 1 VIAL VIA NEBULIZER EVERY 4 HOURS AS NEEDED FOR WHEEZING OR SHORTNESS OF BREATH   albuterol (VENTOLIN HFA) 108 (90 Base) MCG/ACT inhaler USE 1 INHALATION EVERY 6 HOURS AS NEEDED FOR WHEEZING OR SHORTNESS OF BREATH   aspirin EC 81 MG  tablet Take 81 mg by mouth daily.   atorvastatin (LIPITOR) 40 MG tablet    azelastine (ASTELIN) 0.1 % nasal spray USE 1 SPRAY IN EACH NOSTRIL TWICE A DAY   Azelastine-Fluticasone 137-50 MCG/ACT SUSP Place 1 spray into the nose daily.   conjugated estrogens (PREMARIN) vaginal cream Place 1 Applicatorful vaginally every Monday, Wednesday, and Friday at 8 PM.   Cranberry 400 MG CAPS Take 1,200 mg by mouth daily at 2 PM.   denosumab (PROLIA) 60 MG/ML SOSY injection Inject 60 mg into the skin every 6 (six) months.    docusate sodium (COLACE) 100 MG capsule Take 1 capsule (100 mg total) by mouth 2 (two) times daily. (Patient taking differently: Take 100 mg by mouth at bedtime.)   Ensifentrine 3 MG/2.5ML SUSP Inhale 3 mg into the lungs 2 (two) times daily.   Ferrous Fumarate (HEMOCYTE - 106 MG FE) 324 (106 Fe) MG TABS tablet Take 1 tablet by mouth daily at 3 pm.    fluticasone (FLONASE) 50 MCG/ACT nasal spray USE 2 SPRAYS IN EACH NOSTRIL TWICE A DAY   Fluticasone-Umeclidin-Vilant (TRELEGY ELLIPTA) 100-62.5-25 MCG/ACT AEPB Inhale 1 puff into the lungs daily at 6 (six) AM.   folic acid (FOLVITE) 1 MG tablet Take 1 mg by mouth daily.   gabapentin (NEURONTIN) 100 MG capsule TAKE 2 CAPSULES THREE TIMES A DAY   gabapentin (NEURONTIN) 300 MG capsule TAKE 2 CAPSULES THREE TIMES A DAY   guaiFENesin (MUCINEX) 600 MG 12 hr tablet Take 600 mg by mouth 2 (two) times daily.  hydrocortisone 2.5 % cream Apply 1 application topically daily.   Multiple Vitamin (MULTI-VITAMIN) tablet Take 1 tablet by mouth daily.    mupirocin ointment (BACTROBAN) 2 % Apply 1 application topically daily as needed (wound care).    MYRBETRIQ 50 MG TB24 tablet Take 50 mg by mouth at bedtime.   nicotine polacrilex (COMMIT) 4 MG lozenge Take 4 mg by mouth as needed for smoking cessation.    OXYGEN Inhale 5 L into the lungs continuous.   pantoprazole (PROTONIX) 40 MG tablet Take 40 mg by mouth daily.   sodium chloride (OCEAN) 0.65 % SOLN  nasal spray Place 1 spray into both nostrils 2 (two) times daily. As needed during the day   Vitamin D, Cholecalciferol, 25 MCG (1000 UT) TABS Take 1,000 Units by mouth daily.   No facility-administered encounter medications on file as of 06/24/2023.    Allergies as of 06/24/2023 - Review Complete 06/24/2023  Allergen Reaction Noted   Duloxetine hcl Other (See Comments) 08/18/2022    Past Medical History:  Diagnosis Date   Alcohol abuse    Anemia    Cancer (HCC)    breast right   Chronic hyponatremia    COPD (chronic obstructive pulmonary disease) (HCC)    DCIS (ductal carcinoma in situ)    Dyspnea    Emphysema of lung (HCC)    Lung cancer (HCC)    has received 2 treatments of radiation   Osteoporosis    Overactive bladder    PVD (peripheral vascular disease) (HCC)    Tobacco use     Past Surgical History:  Procedure Laterality Date   ABDOMINAL AORTOGRAM W/LOWER EXTREMITY N/A 03/15/2019   Procedure: ABDOMINAL AORTOGRAM W/LOWER EXTREMITY;  Surgeon: Nada Libman, MD;  Location: [facility];  Service: Cardiovascular;  Laterality: N/A;   aeptoplasty     BREAST SURGERY     BRONCHIAL BRUSHINGS  02/12/2021   Procedure: BRONCHIAL BRUSHINGS;  Surgeon: Josephine Igo, DO;  Location: [facility];  Service: Pulmonary;;   BRONCHIAL NEEDLE ASPIRATION BIOPSY  02/12/2021   Procedure: BRONCHIAL NEEDLE ASPIRATION BIOPSIES;  Surgeon: Josephine Igo, DO;  Location: [facility];  Service: Pulmonary;;   cartaract extraction     EYE SURGERY Bilateral    Cataract in R & L eye and Retina surgery after cataract surgery in L eye   FIDUCIAL MARKER PLACEMENT  02/12/2021   Procedure: FIDUCIAL MARKER PLACEMENT;  Surgeon: Josephine Igo, DO;  Location: [facility];  Service: Pulmonary;;   INTRAMEDULLARY (IM) NAIL INTERTROCHANTERIC Left 01/30/2019   Procedure: INTRAMEDULLARY (IM) NAIL INTERTROCHANTRIC;  Surgeon: Gean Birchwood, MD;  Location: [facility] OR;  Service: Orthopedics;  Laterality: Left;    IR FIBRIN GLUE REPAIR ANAL FISTULA     MASTECTOMY     s/p bl   PERIPHERAL VASCULAR INTERVENTION  03/15/2019   Procedure: PERIPHERAL VASCULAR INTERVENTION;  Surgeon: Nada Libman, MD;  [facility]: [facility];  Service: Cardiovascular;;  Lt. SFA   PERIPHERAL VASCULAR THROMBECTOMY  03/15/2019   Procedure: PERIPHERAL VASCULAR THROMBECTOMY;  Surgeon: Nada Libman, MD;  [facility];  Service: Cardiovascular;;  Lt. AT, PT   RETINAL DETACHMENT SURGERY     sclerotherapy vein leg     VASCULAR SURGERY     VIDEO BRONCHOSCOPY WITH ENDOBRONCHIAL NAVIGATION Bilateral 02/12/2021   Procedure: VIDEO BRONCHOSCOPY WITH ENDOBRONCHIAL NAVIGATION;  Surgeon: Josephine Igo, DO;  Location: [facility];  Service: Pulmonary;  Laterality: Bilateral;  ION   VIDEO BRONCHOSCOPY  WITH RADIAL ENDOBRONCHIAL ULTRASOUND  02/12/2021   Procedure: RADIAL ENDOBRONCHIAL ULTRASOUND;  Surgeon: Josephine Igo, DO;  Location: [facility];  Service: Pulmonary;;   vitreous retinal surgery      Family History  Problem Relation Age of Onset   Alzheimer's disease Mother    Pneumonia Father        aspiration pneumonia    Social History   Socioeconomic History   Marital status: Married    Spouse name: Dorinda Hill   Number of children: Not on file   Years of education: Not on file   Highest education level: Not on file  Occupational History   Occupation: RETIRED  Tobacco Use   Smoking status: Former    Current packs/day: 0.00    Average packs/day: 1.5 packs/day for 50.0 years (75.1 ttl pk-yrs)    Types: Cigarettes    Start date: 76    Quit date: 10/06/2016    Years since quitting: 6.7   Smokeless tobacco: Never  Vaping Use   Vaping status: Never Used  Substance and Sexual Activity   Alcohol use: Yes    Alcohol/week: 28.0 standard drinks of alcohol    Types: 28 Glasses of wine per week    Comment: 4 glasses wine a night   Drug use: Not Currently   Sexual activity: Not Currently  Other  Topics Concern   Not on file  Social History Narrative   Right handed,    Lives with husband   Drinks 2 cups caffeine daily   Social Drivers of Health   Financial Resource Strain: Not on file  Food Insecurity: No Food Insecurity (05/25/2023)   Hunger Vital Sign    Worried About Running Out of Food in the Last Year: Never true    Ran Out of Food in the Last Year: Never true  Transportation Needs: No Transportation Needs (05/25/2023)   PRAPARE - Administrator, Civil Service (Medical): No    Lack of Transportation (Non-Medical): No  Physical Activity: Not on file  Stress: Not on file  Social Connections: Unknown (11/19/2021)   Received from [facility]   Social Network    Social Network: Not on file  Intimate Partner Violence: Not At Risk (05/25/2023)   Humiliation, Afraid, Rape, and Kick questionnaire    Fear of Current or Ex-Partner: No    Emotionally Abused: No    Physically Abused: No    Sexually Abused: No    Review of Systems  Constitutional:  Positive for fatigue.  HENT: Negative.    Respiratory:  Positive for shortness of breath.   Cardiovascular:  Negative for chest pain and leg swelling.  Gastrointestinal: Negative.   Endocrine: Negative.   Genitourinary: Negative.   All other systems reviewed and are negative.   Vitals:   06/24/23 1411  BP: (!) 152/70  Pulse: 95  Temp: 98.3 F (36.8 C)  SpO2: 97%     Physical Exam Constitutional:      Appearance: Normal appearance. She is well-developed.  HENT:     Head: Normocephalic and atraumatic.     Mouth/Throat:     Mouth: Mucous membranes are moist.  Eyes:     General:        Right eye: No discharge.        Left eye: No discharge.     Pupils: Pupils are equal, round, and reactive to light.  Cardiovascular:     Rate and Rhythm: Normal rate and regular rhythm.  Pulmonary:  Effort: Pulmonary effort is normal. No respiratory distress.     Breath sounds: Normal breath  sounds. No stridor. No wheezing or rales.  Neurological:     Mental Status: She is alert.  Psychiatric:        Mood and Affect: Mood normal.    Data Reviewed: Records from [facility] reviewed CT scan from 11/07/2019 reviewed showing stable lung nodules CT scan reviewed today 08/03/2020  Most recent CT scan of chest was reviewed with the patient 05/21/2023 showing mostly stable lung nodules with a new nodule in the right midlung  Could not perform PFT previously  Assessment:  Chronic obstructive pulmonary disease -Does not appear to be exacerbated  Hypoxemic respiratory failure -Has been on 6 L of oxygen -This was done down during the office visit today and on 4 L was still maintaining saturations over 90%  multiple lung nodules -Likely metastatic lung disease -New lung nodule  Chronic hypoxemic respiratory failure - Continuing on oxygen supplementation -I believe level of oxygen supplementation can be turned down safely  Panlobular emphysema  Significant debility  Recent fractured femur  Multiple lung nodules  Recently treated for lung cancer with stereotactic radiation -Has follow-up with radiation oncology  Plan/Recommendations:  Will follow-up in about 3 months  She continues to follow-up with oncology  Continue Trelegy Continue nebulization treatments  Will check on paperwork for Ensifentrine  Schedule patient for PET scan  Encourage graded activities as tolerated  Adjust oxygen supplementation to maintain saturations greater than 90%  Encouraged to call with any significant concerns  Virl Diamond MD [facility] 06/24/2023, 2:18 PM  CC: [name], MD

## 2023-06-24 NOTE — Telephone Encounter (Signed)
Left message on VM for patient to call clinic.  Per Mills Koller, pharmacy team has mailed out patient assistance forms for [facility] to patients home for patient to fill out pages 1 and 2.  Patient had OV today with Dr. Wynona Neat.  Patient stated she did not know about the forms.  Patient left clinic before made aware forms were mailed to patient.  Left message on patient VM for patient to fill forms out and bring back to clinic.  If patient did not get forms mailed to her,  will mail out new forms to fill out and bring back for pharmacy team to process.  Awaiting patient response.

## 2023-06-24 NOTE — Patient Instructions (Signed)
We will schedule you for PET scan  Prescription for ipratropium sent to pharmacy  We will check on the prescription for Ensifentrine to ensure we can get her started  Adjust your oxygen to keep saturations just above 90  Follow-up in 3 months

## 2023-06-24 NOTE — Telephone Encounter (Signed)
Patient has paperwork for medication Ensifentrine. States husband will drop form 06/25/2023. Patient phone number is [phone].

## 2023-06-24 NOTE — Telephone Encounter (Signed)
Patient is returning phone call. Patient phone number is [phone].

## 2023-06-25 ENCOUNTER — Telehealth: Payer: Self-pay | Admitting: Pulmonary Disease

## 2023-06-25 NOTE — Telephone Encounter (Signed)
PT's husband dropped off [employer] application. I put in EX box. He dcln a copy. NFN

## 2023-06-29 NOTE — Telephone Encounter (Signed)
Received signed patient and provider forms. Faxed to [employer] with insurance and clinicals  Phone#: [phone] Fax#: [phone]  Chesley Mires, PharmD, MPH, BCPS, CPP Clinical Pharmacist (Rheumatology and Pulmonology)

## 2023-06-30 NOTE — Telephone Encounter (Signed)
Received fax from [employer] that referral form for Annamarie Dawley has been received with attached summary of benefits. Rx will be triaged to [facility] (phone [phone])  Patient ID: [id number]  Chesley Mires, PharmD, MPH, BCPS, CPP Clinical Pharmacist (Rheumatology and Pulmonology)

## 2023-07-02 NOTE — Telephone Encounter (Signed)
We've already received this and submitted to [facility]  Chesley Mires, PharmD, MPH, BCPS, CPP Clinical Pharmacist (Rheumatology and Pulmonology)

## 2023-07-06 ENCOUNTER — Encounter (HOSPITAL_COMMUNITY)
Admission: RE | Admit: 2023-07-06 | Discharge: 2023-07-06 | Disposition: A | Discharge status: Home / Self Care | Payer: Medicare Other | Source: Ambulatory Visit | Attending: Pulmonary Disease | Admitting: Pulmonary Disease

## 2023-07-06 DIAGNOSIS — Z853 Personal history of malignant neoplasm of breast: Secondary | ICD-10-CM | POA: Insufficient documentation

## 2023-07-06 DIAGNOSIS — Z923 Personal history of irradiation: Secondary | ICD-10-CM | POA: Insufficient documentation

## 2023-07-06 DIAGNOSIS — C3431 Malignant neoplasm of lower lobe, right bronchus or lung: Secondary | ICD-10-CM | POA: Insufficient documentation

## 2023-07-06 DIAGNOSIS — R918 Other nonspecific abnormal finding of lung field: Secondary | ICD-10-CM | POA: Diagnosis not present

## 2023-07-06 DIAGNOSIS — R9389 Abnormal findings on diagnostic imaging of other specified body structures: Secondary | ICD-10-CM | POA: Insufficient documentation

## 2023-07-06 DIAGNOSIS — J439 Emphysema, unspecified: Secondary | ICD-10-CM | POA: Insufficient documentation

## 2023-07-06 DIAGNOSIS — I700 Atherosclerosis of aorta: Secondary | ICD-10-CM | POA: Diagnosis not present

## 2023-07-06 LAB — GLUCOSE, CAPILLARY: Glucose-Capillary: 109 mg/dL — ABNORMAL HIGH (ref 70–99)

## 2023-07-06 MED ORDER — FLUDEOXYGLUCOSE F - 18 (FDG) INJECTION
5.0000 | Freq: Once | INTRAVENOUS | Status: AC | PRN
  Administered 2023-07-06: 5 via INTRAVENOUS

## 2023-07-09 ENCOUNTER — Encounter: Payer: Self-pay | Admitting: Radiation Oncology

## 2023-07-10 ENCOUNTER — Other Ambulatory Visit: Payer: Self-pay | Admitting: Adult Health

## 2023-07-12 ENCOUNTER — Encounter: Payer: Self-pay | Admitting: Pulmonary Disease

## 2023-07-13 ENCOUNTER — Other Ambulatory Visit: Payer: Self-pay | Admitting: *Deleted

## 2023-07-13 ENCOUNTER — Encounter: Payer: Self-pay | Admitting: Radiation Oncology

## 2023-07-14 ENCOUNTER — Telehealth: Payer: Self-pay | Admitting: *Deleted

## 2023-07-14 NOTE — Telephone Encounter (Signed)
 Called patient to inform that Jill Side has agreed to see her in person on 08-27-23 @ 2:30 pm for fu, spoke with patient and she verified understanding this

## 2023-07-14 NOTE — Telephone Encounter (Signed)
 PT is calling about PET Results. Please call when they are in .   Beth Daniels is not available until Feb 20th [facility])   [facility] a [facility] Spray azelastine  (ASTELIN )   And    ipratropium  Nasal Solution.06  [phone] is her #  Please post any answers on [facility]

## 2023-07-17 ENCOUNTER — Other Ambulatory Visit: Payer: Self-pay | Admitting: Pulmonary Disease

## 2023-07-17 MED ORDER — IPRATROPIUM BROMIDE 0.06 % NA SOLN: 2.0000 | Freq: Four times a day (QID) | NASAL | 6 refills | Status: DC

## 2023-07-17 MED ORDER — AZELASTINE-FLUTICASONE 137-50 MCG/ACT NA SUSP: 1.0000 | Freq: Every day | NASAL | 6 refills | Status: DC

## 2023-07-17 NOTE — Telephone Encounter (Signed)
Dr. Olalere, please advise. Thanks 

## 2023-08-24 ENCOUNTER — Telehealth: Payer: Self-pay

## 2023-08-24 ENCOUNTER — Encounter: Payer: Self-pay | Admitting: Pulmonary Disease

## 2023-08-24 ENCOUNTER — Ambulatory Visit (HOSPITAL_BASED_OUTPATIENT_CLINIC_OR_DEPARTMENT_OTHER): Payer: Medicare Other

## 2023-08-24 NOTE — Telephone Encounter (Signed)
Patient identity verified. Spoke w/ patient's spouse and notified him of the cancellation of patient's CT-Chest-08/23/22 as well as the cancellation for her follow-up-08/27/23. The CT and follow-up will be rescheduled for a later date, as per [employer].  Understanding verbalized.  This concludes the interaction.  Ruel Favors, LPN

## 2023-08-25 ENCOUNTER — Telehealth: Payer: Self-pay | Admitting: Radiation Oncology

## 2023-08-25 NOTE — Telephone Encounter (Signed)
I called the patient after discussion about her PET scan back in December after Dr. Mitzi Hansen had a chance to review it. He felt the changes are anticipated ones given her prior treatment history and would recommend a repeat scan in 6 months time. I will change our previous order to reflect that. She will continue with Dr. Wynona Neat in pulmonary medicine as currently scheduled.

## 2023-08-27 ENCOUNTER — Ambulatory Visit: Payer: Medicare Other | Admitting: Radiation Oncology

## 2023-08-31 ENCOUNTER — Ambulatory Visit: Payer: Medicare Other | Admitting: Radiation Oncology

## 2023-09-10 ENCOUNTER — Ambulatory Visit: Payer: Medicare Other | Admitting: Pulmonary Disease

## 2023-09-10 ENCOUNTER — Encounter: Payer: Self-pay | Admitting: Pulmonary Disease

## 2023-09-10 VITALS — BP 134/72 | HR 91 | Ht 62.0 in | Wt 91.4 lb

## 2023-09-10 DIAGNOSIS — J9611 Chronic respiratory failure with hypoxia: Secondary | ICD-10-CM

## 2023-09-10 DIAGNOSIS — Z923 Personal history of irradiation: Secondary | ICD-10-CM

## 2023-09-10 DIAGNOSIS — C3431 Malignant neoplasm of lower lobe, right bronchus or lung: Secondary | ICD-10-CM

## 2023-09-10 DIAGNOSIS — J431 Panlobular emphysema: Secondary | ICD-10-CM | POA: Diagnosis not present

## 2023-09-10 DIAGNOSIS — R918 Other nonspecific abnormal finding of lung field: Secondary | ICD-10-CM

## 2023-09-10 DIAGNOSIS — J441 Chronic obstructive pulmonary disease with (acute) exacerbation: Secondary | ICD-10-CM

## 2023-09-10 DIAGNOSIS — Z87891 Personal history of nicotine dependence: Secondary | ICD-10-CM

## 2023-09-10 MED ORDER — SODIUM CHLORIDE 3 % IN NEBU: INHALATION_SOLUTION | RESPIRATORY_TRACT | 5 refills | Status: DC | PRN

## 2023-09-10 NOTE — Patient Instructions (Signed)
 Use flutter device at least twice a day, multiple maneuvers each time -Helps clear secretions  Hypertonic saline via nebulizer to be used twice a day  DME referral for nebulizer  Call us with significant concerns  Follow-up in 3 months

## 2023-09-10 NOTE — Progress Notes (Signed)
 Ct                Beth Daniels    [id number]    1946-02-01  Primary Care Physician:Russo, Jonny Ruiz, MD  Referring Physician: [name], MD [address] [place],  [place] [address]  Chief complaint:   Patient in for follow-up for obstructive lung disease  HPI:  Recently completed treatment for presumed lung cancer right lower lobe nodule  Follow-up PET scan did show improvement in nodule at the left base  Recent shortness of breath which was felt to be related to [facility]  She does have a cough, some congestion  History of breast cancer  Shortness of breath with activities, uses oxygen around-the-clock at about 6 L  Remains compliant with Trelegy and nebulization treatments  Cough with sputum production Nasal stuffiness and congestion-uses Astelin  She was following up at [facility] Records were sent over which were reviewed  Was unable to perform a PFT in the past  She does have advanced chronic obstructive pulmonary disease, extensive emphysema  Tolerated rehab well for broken hip  She does have peripheral arterial disease, significant peripheral neuropathy History of breast cancer-right breast in 2013  Outpatient Encounter Medications as of 09/10/2023  Medication Sig   albuterol (PROVENTIL) (2.5 MG/3ML) 0.083% nebulizer solution INHALE CONTENTS OF 1 VIAL VIA NEBULIZER EVERY 4 HOURS AS NEEDED FOR WHEEZING OR SHORTNESS OF BREATH   albuterol (VENTOLIN HFA) 108 (90 Base) MCG/ACT inhaler USE 1 INHALATION EVERY 6 HOURS AS NEEDED FOR WHEEZING OR SHORTNESS OF BREATH   aspirin EC 81 MG tablet Take 81 mg by mouth daily.   atorvastatin (LIPITOR) 40 MG tablet    azelastine (ASTELIN) 0.1 % nasal spray USE 1 SPRAY IN EACH NOSTRIL TWICE A DAY   Azelastine-Fluticasone 137-50 MCG/ACT SUSP Place 1 spray into the nose daily.   conjugated estrogens (PREMARIN) vaginal cream Place 1 Applicatorful vaginally every Monday, Wednesday, and Friday at 8 PM.   Cranberry 400 MG CAPS Take 1,200 mg  by mouth daily at 2 PM.   denosumab (PROLIA) 60 MG/ML SOSY injection Inject 60 mg into the skin every 6 (six) months.    docusate sodium (COLACE) 100 MG capsule Take 1 capsule (100 mg total) by mouth 2 (two) times daily. (Patient taking differently: Take 100 mg by mouth at bedtime.)   Ferrous Fumarate (HEMOCYTE - 106 MG FE) 324 (106 Fe) MG TABS tablet Take 1 tablet by mouth daily at 3 pm.    fluticasone (FLONASE) 50 MCG/ACT nasal spray USE 2 SPRAYS IN EACH NOSTRIL TWICE A DAY   folic acid (FOLVITE) 1 MG tablet Take 1 mg by mouth daily.   gabapentin (NEURONTIN) 100 MG capsule TAKE 2 CAPSULES THREE TIMES A DAY   gabapentin (NEURONTIN) 300 MG capsule TAKE 2 CAPSULES THREE TIMES A DAY   guaiFENesin (MUCINEX) 600 MG 12 hr tablet Take 600 mg by mouth 2 (two) times daily.   hydrocortisone 2.5 % cream Apply 1 application topically daily.   ipratropium (ATROVENT) 0.06 % nasal spray Place 2 sprays into both nostrils 4 (four) times daily.   Multiple Vitamin (MULTI-VITAMIN) tablet Take 1 tablet by mouth daily.    mupirocin ointment (BACTROBAN) 2 % Apply 1 application topically daily as needed (wound care).    MYRBETRIQ 50 MG TB24 tablet Take 50 mg by mouth at bedtime.   nicotine polacrilex (COMMIT) 4 MG lozenge Take 4 mg by mouth as needed for smoking cessation.    OXYGEN Inhale 5 L into  the lungs continuous.   pantoprazole (PROTONIX) 40 MG tablet Take 40 mg by mouth daily.   sodium chloride (OCEAN) 0.65 % SOLN nasal spray Place 1 spray into both nostrils 2 (two) times daily. As needed during the day   sodium chloride HYPERTONIC 3 % nebulizer solution Take by nebulization as needed for other.   TRELEGY ELLIPTA 100-62.5-25 MCG/ACT AEPB USE 1 INHALATION DAILY AT 6:00 A.M.   Vitamin D, Cholecalciferol, 25 MCG (1000 UT) TABS Take 1,000 Units by mouth daily.   No facility-administered encounter medications on file as of 09/10/2023.    Allergies as of 09/10/2023 - Review Complete 09/10/2023  Allergen Reaction  Noted   Duloxetine hcl Other (See Comments) 08/18/2022    Past Medical History:  Diagnosis Date   Alcohol abuse    Anemia    Cancer (HCC)    breast right   Chronic hyponatremia    COPD (chronic obstructive pulmonary disease) (HCC)    DCIS (ductal carcinoma in situ)    Dyspnea    Emphysema of lung (HCC)    Lung cancer (HCC)    has received 2 treatments of radiation   Osteoporosis    Overactive bladder    PVD (peripheral vascular disease) (HCC)    Tobacco use     Past Surgical History:  Procedure Laterality Date   ABDOMINAL AORTOGRAM W/LOWER EXTREMITY N/A 03/15/2019   Procedure: ABDOMINAL AORTOGRAM W/LOWER EXTREMITY;  Surgeon: Nada Libman, MD;  [facility];  Service: Cardiovascular;  Laterality: N/A;   aeptoplasty     BREAST SURGERY     BRONCHIAL BRUSHINGS  02/12/2021   Procedure: BRONCHIAL BRUSHINGS;  Surgeon: Josephine Igo, DO;  [facility];  Service: Pulmonary;;   BRONCHIAL NEEDLE ASPIRATION BIOPSY  02/12/2021   Procedure: BRONCHIAL NEEDLE ASPIRATION BIOPSIES;  Surgeon: Josephine Igo, DO;  [facility];  Service: Pulmonary;;   cartaract extraction     EYE SURGERY Bilateral    Cataract in R & L eye and Retina surgery after cataract surgery in L eye   FIDUCIAL MARKER PLACEMENT  02/12/2021   Procedure: FIDUCIAL MARKER PLACEMENT;  Surgeon: Josephine Igo, DO;  [facility];  Service: Pulmonary;;   INTRAMEDULLARY (IM) NAIL INTERTROCHANTERIC Left 01/30/2019   Procedure: INTRAMEDULLARY (IM) NAIL INTERTROCHANTRIC;  Surgeon: Gean Birchwood, MD;  [facility];  Service: Orthopedics;  Laterality: Left;   IR FIBRIN GLUE REPAIR ANAL FISTULA     MASTECTOMY     s/p bl   PERIPHERAL VASCULAR INTERVENTION  03/15/2019   Procedure: PERIPHERAL VASCULAR INTERVENTION;  Surgeon: Nada Libman, MD;  [facility];  Service: Cardiovascular;;  Lt. SFA   PERIPHERAL VASCULAR THROMBECTOMY  03/15/2019   Procedure: PERIPHERAL  VASCULAR THROMBECTOMY;  Surgeon: Nada Libman, MD;  [facility];  Service: Cardiovascular;;  Lt. AT, PT   RETINAL DETACHMENT SURGERY     sclerotherapy vein leg     VASCULAR SURGERY     VIDEO BRONCHOSCOPY WITH ENDOBRONCHIAL NAVIGATION Bilateral 02/12/2021   Procedure: VIDEO BRONCHOSCOPY WITH ENDOBRONCHIAL NAVIGATION;  Surgeon: Josephine Igo, DO;  [facility];  Service: Pulmonary;  Laterality: Bilateral;  ION   VIDEO BRONCHOSCOPY WITH RADIAL ENDOBRONCHIAL ULTRASOUND  02/12/2021   Procedure: RADIAL ENDOBRONCHIAL ULTRASOUND;  Surgeon: Josephine Igo, DO;  [facility];  Service: Pulmonary;;   vitreous retinal surgery      Family History  Problem Relation Age of Onset   Alzheimer's disease Mother  Pneumonia Father        aspiration pneumonia    Social History   Socioeconomic History   Marital status: Married    Spouse name: Dorinda Hill   Number of children: Not on file   Years of education: Not on file   Highest education level: Not on file  Occupational History   Occupation: RETIRED  Tobacco Use   Smoking status: Former    Current packs/day: 0.00    Average packs/day: 1.5 packs/day for 50.0 years (75.1 ttl pk-yrs)    Types: Cigarettes    Start date: 55    Quit date: 10/06/2016    Years since quitting: 6.9   Smokeless tobacco: Never  Vaping Use   Vaping status: Never Used  Substance and Sexual Activity   Alcohol use: Yes    Alcohol/week: 28.0 standard drinks of alcohol    Types: 28 Glasses of wine per week    Comment: 4 glasses wine a night   Drug use: Not Currently   Sexual activity: Not Currently  Other Topics Concern   Not on file  Social History Narrative   Right handed,    Lives with husband   Drinks 2 cups caffeine daily   Social Drivers of Health   Financial Resource Strain: Not on file  Food Insecurity: No Food Insecurity (05/25/2023)   Hunger Vital Sign    Worried About Running Out of Food in the Last Year: Never  true    Ran Out of Food in the Last Year: Never true  Transportation Needs: No Transportation Needs (05/25/2023)   PRAPARE - Administrator, Civil Service (Medical): No    Lack of Transportation (Non-Medical): No  Physical Activity: Not on file  Stress: Not on file  Social Connections: Unknown (11/19/2021)   Received from [facility], [facility]   Social Network    Social Network: Not on file  Intimate Partner Violence: Not At Risk (05/25/2023)   Humiliation, Afraid, Rape, and Kick questionnaire    Fear of Current or Ex-Partner: No    Emotionally Abused: No    Physically Abused: No    Sexually Abused: No    Review of Systems  Constitutional:  Positive for fatigue.  HENT: Negative.    Respiratory:  Positive for cough and shortness of breath.   Cardiovascular:  Negative for chest pain and leg swelling.  Gastrointestinal: Negative.   Endocrine: Negative.   Genitourinary: Negative.   All other systems reviewed and are negative.   Vitals:   09/10/23 1456  BP: 134/72  Pulse: 91  SpO2: 95%     Physical Exam Constitutional:      Appearance: Normal appearance. She is well-developed.  HENT:     Head: Normocephalic and atraumatic.     Mouth/Throat:     Mouth: Mucous membranes are moist.  Eyes:     General:        Right eye: No discharge.        Left eye: No discharge.     Pupils: Pupils are equal, round, and reactive to light.  Cardiovascular:     Rate and Rhythm: Normal rate and regular rhythm.  Pulmonary:     Effort: Pulmonary effort is normal. No respiratory distress.     Breath sounds: No stridor. No wheezing or rhonchi.     Comments: Decreased air movement bilaterally Neurological:     Mental Status: She is alert.  Psychiatric:        Mood and Affect: Mood normal.  Data Reviewed: CT scan from 11/07/2019 reviewed showing stable lung nodules CT scan reviewed today 08/03/2020  Could not perform PFT previously  Most recent  PET/CT-07/06/2023  Assessment:  Chronic obstructive pulmonary disease -Does not appear acutely exacerbated -Having difficulty clearing secretions  Hypoxemic respiratory failure -Continues on 6 L of oxygen around-the-clock  multiple lung nodules Recent PET scan shows stability with recommendation to repeat in about 3 to 4 months  Chronic hypoxemic respiratory failure - Continuing on oxygen supplementation -Adjust oxygen supplementation based off of oximetry monitoring  Panlobular emphysema  Significant debility  Multiple lung nodules  Recently treated for lung cancer with stereotactic radiation -Continues to follow-up with oncology  Plan/Recommendations:  Will follow-up in about 3 months  Flutter device to help manage secretions  Prescription for hypertonic saline will be sent to pharmacy  DME referral for new nebulizer  Encouraged to call with significant concerns  Continue to use Trelegy  Encourage graded activities as tolerated  Adjust oxygen supplementation to maintain saturations greater than 90%  Encouraged to call with any significant concerns  Virl Diamond MD [facility] 09/10/2023, 6:02 PM  CC: [name], MD

## 2023-09-11 ENCOUNTER — Other Ambulatory Visit: Payer: Self-pay | Admitting: Neurology

## 2023-09-14 NOTE — Telephone Encounter (Signed)
 Change to 400mg  2 tabs tid

## 2023-09-14 NOTE — Telephone Encounter (Signed)
 Looks like this was how it was rx'd last by Dr. Terrace Arabia. Okay to place gabapentin 400mg  cap taking 2 caps TID. Thank you.

## 2023-09-14 NOTE — Telephone Encounter (Signed)
 Shanda Bumps,  Your last note stated: Continue gabapentin 800mg  3 times daily   Is there a away we could change this to the 400mg  capsules so taking less pills? Thanks,  Production assistant, radio

## 2023-09-21 ENCOUNTER — Other Ambulatory Visit: Payer: Self-pay | Admitting: Pulmonary Disease

## 2023-09-21 MED ORDER — SODIUM CHLORIDE 3 % IN NEBU: INHALATION_SOLUTION | Freq: Two times a day (BID) | RESPIRATORY_TRACT | 5 refills | Status: DC

## 2023-12-09 ENCOUNTER — Ambulatory Visit (HOSPITAL_BASED_OUTPATIENT_CLINIC_OR_DEPARTMENT_OTHER)
Admission: RE | Admit: 2023-12-09 | Discharge: 2023-12-09 | Disposition: A | Discharge status: Home / Self Care | Source: Ambulatory Visit | Attending: Radiation Oncology | Admitting: Radiation Oncology

## 2023-12-09 DIAGNOSIS — C3431 Malignant neoplasm of lower lobe, right bronchus or lung: Secondary | ICD-10-CM | POA: Insufficient documentation

## 2023-12-09 DIAGNOSIS — C3432 Malignant neoplasm of lower lobe, left bronchus or lung: Secondary | ICD-10-CM | POA: Insufficient documentation

## 2023-12-09 MED ORDER — IOHEXOL 300 MG/ML  SOLN
100.0000 mL | Freq: Once | INTRAMUSCULAR | Status: AC | PRN
  Administered 2023-12-09: 100 mL via INTRAVENOUS

## 2023-12-10 LAB — POCT I-STAT CREATININE: Creatinine, Ser: 0.5 mg/dL (ref 0.44–1.00)

## 2023-12-14 ENCOUNTER — Ambulatory Visit: Payer: Self-pay | Admitting: Radiation Oncology

## 2023-12-14 ENCOUNTER — Encounter: Payer: Self-pay | Admitting: Pulmonary Disease

## 2023-12-14 ENCOUNTER — Ambulatory Visit: Admitting: Pulmonary Disease

## 2023-12-14 VITALS — BP 128/67 | HR 90 | Ht 62.0 in | Wt 90.2 lb

## 2023-12-14 DIAGNOSIS — R918 Other nonspecific abnormal finding of lung field: Secondary | ICD-10-CM | POA: Diagnosis not present

## 2023-12-14 DIAGNOSIS — E43 Unspecified severe protein-calorie malnutrition: Secondary | ICD-10-CM

## 2023-12-14 DIAGNOSIS — J9611 Chronic respiratory failure with hypoxia: Secondary | ICD-10-CM

## 2023-12-14 DIAGNOSIS — R5381 Other malaise: Secondary | ICD-10-CM

## 2023-12-14 DIAGNOSIS — I272 Pulmonary hypertension, unspecified: Secondary | ICD-10-CM | POA: Diagnosis not present

## 2023-12-14 DIAGNOSIS — J449 Chronic obstructive pulmonary disease, unspecified: Secondary | ICD-10-CM

## 2023-12-14 DIAGNOSIS — J431 Panlobular emphysema: Secondary | ICD-10-CM | POA: Diagnosis not present

## 2023-12-14 DIAGNOSIS — C3431 Malignant neoplasm of lower lobe, right bronchus or lung: Secondary | ICD-10-CM

## 2023-12-14 DIAGNOSIS — Z87891 Personal history of nicotine dependence: Secondary | ICD-10-CM

## 2023-12-14 DIAGNOSIS — C3490 Malignant neoplasm of unspecified part of unspecified bronchus or lung: Secondary | ICD-10-CM

## 2023-12-14 DIAGNOSIS — R9389 Abnormal findings on diagnostic imaging of other specified body structures: Secondary | ICD-10-CM

## 2023-12-14 DIAGNOSIS — C3432 Malignant neoplasm of lower lobe, left bronchus or lung: Secondary | ICD-10-CM

## 2023-12-14 MED ORDER — TRELEGY ELLIPTA 200-62.5-25 MCG/ACT IN AEPB: 1.0000 | INHALATION_SPRAY | Freq: Every day | RESPIRATORY_TRACT | 3 refills | Status: DC

## 2023-12-14 NOTE — Telephone Encounter (Signed)
 I send a message to Dr. Gaynell Keeler to let him know we were pleased with her recent CT scan and she happens to be in the office with him today for her routine visit. I'll order another CT scan of the chest in 6 months time.

## 2023-12-14 NOTE — Patient Instructions (Signed)
 I will see you back in 3 months  Will put in a prescription for Trelegy 200 in place of Trelegy 100  Continue your nebulizer use  Call us  with significant concerns

## 2023-12-14 NOTE — Progress Notes (Signed)
 Beth Daniels    [id number]    1945-11-08  Primary Care Physician:Russo, Autry Legions, MD  Referring Physician: Margarete Sharps, MD [address] [place],  [place] [address]  Chief complaint:   Patient in for follow-up for obstructive lung disease  HPI:  Patient with a history of lung cancer, s/p radiation treatment Had a CT scan 12/09/2023-compared with CT 07/06/2023 -Multiple lung nodules -Spiculated 8 mm right lower lobe nodule corresponding to nontreated primary bronchogenic carcinoma -Overall CT scan is stable from previous -She does have multiple nodules, nodules noted to be benign on PET scan  Recently had SBRT to right lower lobe nodule  Did have some shortness of breath and oxygen desaturations when she was trying to take a shower recently No increased cough, no increased shortness of breath She does have congestion and wheezing  Past history of breast cancer  She does try to move around the house but not very active, uses oxygen around-the-clock Remains compliant with Trelegy and nebulization treatments  Cough with sputum production Nasal stuffiness and congestion-uses Astelin   She was following up at [facility] Records were sent over which were reviewed  Was unable to perform a PFT in the past  She does have advanced chronic obstructive pulmonary disease, extensive emphysema  Tolerated rehab well for broken hip  She does have peripheral arterial disease, significant peripheral neuropathy History of breast cancer-right breast in 2013  Outpatient Encounter Medications as of 12/14/2023  Medication Sig   albuterol  (PROVENTIL ) (2.5 MG/3ML) 0.083% nebulizer solution INHALE CONTENTS OF 1 VIAL VIA NEBULIZER EVERY 4 HOURS AS NEEDED FOR WHEEZING OR SHORTNESS OF BREATH   albuterol  (VENTOLIN  HFA) 108 (90 Base) MCG/ACT inhaler USE 1 INHALATION EVERY 6 HOURS AS NEEDED FOR WHEEZING OR SHORTNESS OF BREATH   aspirin  EC 81 MG tablet Take 81 mg by mouth daily.   atorvastatin   (LIPITOR) 40 MG tablet    azelastine  (ASTELIN ) 0.1 % nasal spray USE 1 SPRAY IN EACH NOSTRIL TWICE A DAY   Azelastine -Fluticasone  137-50 MCG/ACT SUSP Place 1 spray into the nose daily.   conjugated estrogens (PREMARIN) vaginal cream Place 1 Applicatorful vaginally every Monday, Wednesday, and Friday at 8 PM.   Cranberry 400 MG CAPS Take 1,200 mg by mouth daily at 2 PM.   denosumab  (PROLIA ) 60 MG/ML SOSY injection Inject 60 mg into the skin every 6 (six) months.    docusate sodium  (COLACE) 100 MG capsule Take 1 capsule (100 mg total) by mouth 2 (two) times daily. (Patient taking differently: Take 100 mg by mouth at bedtime.)   Ferrous Fumarate (HEMOCYTE - 106 MG FE) 324 (106 Fe) MG TABS tablet Take 1 tablet by mouth daily at 3 pm.    fluticasone  (FLONASE ) 50 MCG/ACT nasal spray USE 2 SPRAYS IN EACH NOSTRIL TWICE A DAY   folic acid  (FOLVITE ) 1 MG tablet Take 1 mg by mouth daily.   gabapentin  (NEURONTIN ) 300 MG capsule TAKE 2 CAPSULES THREE TIMES A DAY   gabapentin  (NEURONTIN ) 400 MG capsule Take 2 capsules (800 mg total) by mouth 3 (three) times daily.   guaiFENesin  (MUCINEX ) 600 MG 12 hr tablet Take 600 mg by mouth 2 (two) times daily.   hydrocortisone 2.5 % cream Apply 1 application topically daily.   ipratropium (ATROVENT ) 0.06 % nasal spray Place 2 sprays into both nostrils 4 (four) times daily.   Multiple Vitamin (MULTI-VITAMIN) tablet Take 1 tablet by mouth daily.    mupirocin ointment (BACTROBAN) 2 %  Apply 1 application topically daily as needed (wound care).    MYRBETRIQ 50 MG TB24 tablet Take 50 mg by mouth at bedtime.   nicotine polacrilex (COMMIT) 4 MG lozenge Take 4 mg by mouth as needed for smoking cessation.    OXYGEN Inhale 5 L into the lungs continuous.   pantoprazole  (PROTONIX ) 40 MG tablet Take 40 mg by mouth daily.   sodium chloride  (OCEAN) 0.65 % SOLN nasal spray Place 1 spray into both nostrils 2 (two) times daily. As needed during the day   sodium chloride  HYPERTONIC 3 %  nebulizer solution Take by nebulization 2 (two) times daily. 4 cc nebulized twice a day   TRELEGY ELLIPTA  100-62.5-25 MCG/ACT AEPB USE 1 INHALATION DAILY AT 6:00 A.M.   Vitamin D , Cholecalciferol, 25 MCG (1000 UT) TABS Take 1,000 Units by mouth daily.   No facility-administered encounter medications on file as of 12/14/2023.    Allergies as of 12/14/2023   (No Known Allergies)    Past Medical History:  Diagnosis Date   Alcohol abuse    Anemia    Cancer (HCC)    breast right   Chronic hyponatremia    COPD (chronic obstructive pulmonary disease) (HCC)    DCIS (ductal carcinoma in situ)    Dyspnea    Emphysema of lung (HCC)    Lung cancer (HCC)    has received 2 treatments of radiation   Osteoporosis    Overactive bladder    PVD (peripheral vascular disease) (HCC)    Tobacco use     Past Surgical History:  Procedure Laterality Date   ABDOMINAL AORTOGRAM W/LOWER EXTREMITY N/A 03/15/2019   Procedure: ABDOMINAL AORTOGRAM W/LOWER EXTREMITY;  Surgeon: Margherita Shell, MD;  [facility];  Service: Cardiovascular;  Laterality: N/A;   aeptoplasty     BREAST SURGERY     BRONCHIAL BRUSHINGS  02/12/2021   Procedure: BRONCHIAL BRUSHINGS;  Surgeon: Prudy Brownie, DO;  [facility];  Service: Pulmonary;;   BRONCHIAL NEEDLE ASPIRATION BIOPSY  02/12/2021   Procedure: BRONCHIAL NEEDLE ASPIRATION BIOPSIES;  Surgeon: Prudy Brownie, DO;  [facility];  Service: Pulmonary;;   cartaract extraction     EYE SURGERY Bilateral    Cataract in R & L eye and Retina surgery after cataract surgery in L eye   FIDUCIAL MARKER PLACEMENT  02/12/2021   Procedure: FIDUCIAL MARKER PLACEMENT;  Surgeon: Prudy Brownie, DO;  [facility];  Service: Pulmonary;;   INTRAMEDULLARY (IM) NAIL INTERTROCHANTERIC Left 01/30/2019   Procedure: INTRAMEDULLARY (IM) NAIL INTERTROCHANTRIC;  Surgeon: Wendolyn Hamburger, MD;  [facility];  Service: Orthopedics;  Laterality: Left;   IR  FIBRIN GLUE REPAIR ANAL FISTULA     MASTECTOMY     s/p bl   PERIPHERAL VASCULAR INTERVENTION  03/15/2019   Procedure: PERIPHERAL VASCULAR INTERVENTION;  Surgeon: Margherita Shell, MD;  [facility];  Service: Cardiovascular;;  Lt. SFA   PERIPHERAL VASCULAR THROMBECTOMY  03/15/2019   Procedure: PERIPHERAL VASCULAR THROMBECTOMY;  Surgeon: Margherita Shell, MD;  [facility];  Service: Cardiovascular;;  Lt. AT, PT   RETINAL DETACHMENT SURGERY     sclerotherapy vein leg     VASCULAR SURGERY     VIDEO BRONCHOSCOPY WITH ENDOBRONCHIAL NAVIGATION Bilateral 02/12/2021   Procedure: VIDEO BRONCHOSCOPY WITH ENDOBRONCHIAL NAVIGATION;  Surgeon: Prudy Brownie, DO;  [facility];  Service: Pulmonary;  Laterality: Bilateral;  ION   VIDEO BRONCHOSCOPY WITH RADIAL ENDOBRONCHIAL ULTRASOUND  02/12/2021   Procedure: RADIAL ENDOBRONCHIAL ULTRASOUND;  Surgeon: Prudy Brownie, DO;  [facility];  Service: Pulmonary;;   vitreous retinal surgery      Family History  Problem Relation Age of Onset   Alzheimer's disease Mother    Pneumonia Father        aspiration pneumonia    Social History   Socioeconomic History   Marital status: Married    Spouse name: Gwinda Leopard   Number of children: Not on file   Years of education: Not on file   Highest education level: Not on file  Occupational History   Occupation: RETIRED  Tobacco Use   Smoking status: Former    Current packs/day: 0.00    Average packs/day: 1.5 packs/day for 50.0 years (75.1 ttl pk-yrs)    Types: Cigarettes    Start date: 75    Quit date: 10/06/2016    Years since quitting: 7.1   Smokeless tobacco: Never  Vaping Use   Vaping status: Never Used  Substance and Sexual Activity   Alcohol use: Yes    Alcohol/week: 28.0 standard drinks of alcohol    Types: 28 Glasses of wine per week    Comment: 4 glasses wine a night   Drug use: Not Currently   Sexual activity: Not Currently  Other Topics  Concern   Not on file  Social History Narrative   Right handed,    Lives with husband   Drinks 2 cups caffeine daily   Social Drivers of Health   Financial Resource Strain: Not on file  Food Insecurity: No Food Insecurity (05/25/2023)   Hunger Vital Sign    Worried About Running Out of Food in the Last Year: Never true    Ran Out of Food in the Last Year: Never true  Transportation Needs: No Transportation Needs (05/25/2023)   PRAPARE - Administrator, Civil Service (Medical): No    Lack of Transportation (Non-Medical): No  Physical Activity: Not on file  Stress: Not on file  Social Connections: Unknown (11/19/2021)   Received from [facility], [facility]   Social Network    Social Network: Not on file  Intimate Partner Violence: Not At Risk (05/25/2023)   Humiliation, Afraid, Rape, and Kick questionnaire    Fear of Current or Ex-Partner: No    Emotionally Abused: No    Physically Abused: No    Sexually Abused: No    Review of Systems  Constitutional:  Positive for fatigue.  HENT: Negative.    Respiratory:  Positive for cough and shortness of breath.   Cardiovascular:  Negative for chest pain and leg swelling.  Gastrointestinal: Negative.   Endocrine: Negative.   Genitourinary: Negative.   All other systems reviewed and are negative.   Vitals:   12/14/23 1422  BP: 128/67  Pulse: 90  SpO2: 98%     Physical Exam Constitutional:      Appearance: Normal appearance. She is well-developed.  HENT:     Head: Normocephalic and atraumatic.     Mouth/Throat:     Mouth: Mucous membranes are moist.  Eyes:     General:        Right eye: No discharge.        Left eye: No discharge.     Pupils: Pupils are equal, round, and reactive to light.  Cardiovascular:     Rate and Rhythm: Normal rate and regular rhythm.  Pulmonary:     Effort: Pulmonary effort is normal. No respiratory  distress.     Breath sounds: No stridor. No wheezing or rhonchi.      Comments: Decreased air movement bilaterally Neurological:     Mental Status: She is alert.  Psychiatric:        Mood and Affect: Mood normal.    Data Reviewed: CT scan from 11/07/2019 reviewed showing stable lung nodules CT scan reviewed today 08/03/2020 I reviewed CT scan of the chest 12/09/2023 compared with 05/21/2023-stable lung nodules  Could not perform PFT previously  Most recent PET/CT-07/06/2023  Assessment:  Chronic obstructive pulmonary disease -Does not appear acutely exacerbated - Compliant with nebulization treatments  Chronic hypoxemic respiratory failure - Continues on oxygen supplementation around-the-clock  Multiple lung nodules - S/p SBRT treatment - Nodules are stable on recent CT - Plan will be for repeat CT in about 6 months  Moderate pulmonary hypertension secondary to advanced lung disease  Panlobular emphysema  Deconditioning  Significant debility  Continues to follow-up with radiation oncology  Plan/Recommendations:  Will follow-up in about 3 months  Encouraged to continue nebulization treatments  Use flutter device for secretion clearance  Use hypertonic saline  Graded activities as tolerated  Continue oxygen supplementation  Call with significant concerns  Follow-up CT in about 6 months  Myer Artis MD [facility] 12/14/2023, 3:03 PM  CC: Margarete Sharps, MD

## 2023-12-15 ENCOUNTER — Other Ambulatory Visit (HOSPITAL_BASED_OUTPATIENT_CLINIC_OR_DEPARTMENT_OTHER)

## 2023-12-22 ENCOUNTER — Telehealth: Payer: Self-pay | Admitting: *Deleted

## 2023-12-22 NOTE — Telephone Encounter (Signed)
 CALLED PATIENT TO INFORM OF CT FOR 06-14-24- ARRIVAL TIME- 2:15 PM @ [facility], NO RESTRICTIONS TO SCAN, PATIENT TO RECEIVE RESULTS FROM 06-27-24 @ 2 PM VIA TELEPHONE, SPOKE WITH PATIENT'S HUSBAND DONALD AND HE IS AWARE OF THESE APPTS. AND THE INSTRUCTIONS

## 2023-12-25 ENCOUNTER — Other Ambulatory Visit: Payer: Self-pay | Admitting: Adult Health

## 2023-12-28 ENCOUNTER — Ambulatory Visit

## 2023-12-28 ENCOUNTER — Ambulatory Visit (HOSPITAL_COMMUNITY)
Admission: RE | Admit: 2023-12-28 | Discharge: 2023-12-28 | Disposition: A | Discharge status: Home / Self Care | Source: Ambulatory Visit | Attending: Surgery | Admitting: Surgery

## 2023-12-28 ENCOUNTER — Ambulatory Visit (HOSPITAL_BASED_OUTPATIENT_CLINIC_OR_DEPARTMENT_OTHER)
Admission: RE | Admit: 2023-12-28 | Discharge: 2023-12-28 | Disposition: A | Discharge status: Home / Self Care | Source: Ambulatory Visit | Attending: Surgery | Admitting: Surgery

## 2023-12-28 DIAGNOSIS — I779 Disorder of arteries and arterioles, unspecified: Secondary | ICD-10-CM

## 2023-12-28 LAB — VAS US ABI WITH/WO TBI
Left ABI: 0.47
Right ABI: 1.01

## 2024-01-11 ENCOUNTER — Ambulatory Visit: Attending: Surgery | Admitting: Surgery

## 2024-01-11 ENCOUNTER — Encounter: Payer: Self-pay | Admitting: Surgery

## 2024-01-11 VITALS — BP 149/68 | HR 103 | Temp 97.9°F | Ht 62.0 in | Wt 89.0 lb

## 2024-01-11 DIAGNOSIS — I70213 Atherosclerosis of native arteries of extremities with intermittent claudication, bilateral legs: Secondary | ICD-10-CM | POA: Insufficient documentation

## 2024-01-11 NOTE — Progress Notes (Signed)
 [facility] [place]  Patient name: Beth Daniels MRN: [id number] DOB: July 25, 1945 Sex: female   REASON FOR VISIT:    Follow up  HISOTRY OF PRESENT ILLNESS:    Beth Daniels is a 78 y.o. female, who transferred her vascular care from Dr. Darra at [facility] to me because she moved to this area.  She has a history of peripheral vascular disease having undergone left superficial femoral artery stenting on 10/2016.  She then underwent drug-coated balloon angioplasty for in-stent stenosis on 09/2017 and then on 05/2018, Cutting Balloon angioplasty of the left superficial femoral artery and balloon angioplasty of the right external iliac artery.  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  Currently, she denies claudication or rest pain.  She does not have any open wounds.  I placed a drug eluding stent on 03/15/2019 for a greater than 80% stenosis at the proximal edge of her prior stents in the left superficial femoral artery.  Her stents are known to be occluded.  She does not have any significant symptoms at this time.     The patient is on dual antiplatelet therapy with aspirin  and Plavix .  She takes a statin for hypercholesterolemia.  She is a former smoker.  She has COPD and is on home oxygen.  She has recently fallen and broken her hip which was surgically repaired.   PAST MEDICAL HISTORY:   Past Medical History:  Diagnosis Date   Alcohol abuse    Anemia    Cancer (HCC)    breast right   Chronic hyponatremia    COPD (chronic obstructive pulmonary disease) (HCC)    DCIS (ductal carcinoma in situ)    Dyspnea    Emphysema of lung (HCC)    Lung cancer (HCC)    has received 2 treatments of radiation   Osteoporosis    Overactive bladder    PVD (peripheral vascular disease) (HCC)    Tobacco use      FAMILY HISTORY:   Family History  Problem Relation Age of Onset   Alzheimer's  disease Mother    Pneumonia Father        aspiration pneumonia    SOCIAL HISTORY:   Social History   Tobacco Use   Smoking status: Former    Current packs/day: 0.00    Average packs/day: 1.5 packs/day for 50.0 years (75.1 ttl pk-yrs)    Types: Cigarettes    Start date: 83    Quit date: 10/06/2016    Years since quitting: 7.2   Smokeless tobacco: Never  Substance Use Topics   Alcohol use: Yes    Alcohol/week: 28.0 standard drinks of alcohol    Types: 28 Glasses of wine per week    Comment: 4 glasses wine a night     ALLERGIES:   No Known Allergies   CURRENT MEDICATIONS:   Current Outpatient Medications  Medication Sig Dispense Refill   albuterol  (PROVENTIL ) (2.5 MG/3ML) 0.083% nebulizer solution INHALE CONTENTS OF 1 VIAL VIA NEBULIZER EVERY 4 HOURS AS NEEDED FOR WHEEZING OR SHORTNESS OF BREATH 360 mL 3   albuterol  (VENTOLIN  HFA) 108 (90 Base) MCG/ACT inhaler USE 1 INHALATION EVERY 6 HOURS AS NEEDED FOR WHEEZING OR SHORTNESS OF BREATH 17 g 3   aspirin  EC 81 MG tablet Take 81 mg by mouth daily.     atorvastatin  (LIPITOR) 40 MG tablet      azelastine  (ASTELIN ) 0.1 % nasal spray USE 1 SPRAY  IN EACH NOSTRIL TWICE A DAY 30 mL 6   Azelastine -Fluticasone  137-50 MCG/ACT SUSP Place 1 spray into the nose daily. 23 g 6   conjugated estrogens (PREMARIN) vaginal cream Place 1 Applicatorful vaginally every Monday, Wednesday, and Friday at 8 PM.     Cranberry 400 MG CAPS Take 1,200 mg by mouth daily at 2 PM.     denosumab  (PROLIA ) 60 MG/ML SOSY injection Inject 60 mg into the skin every 6 (six) months.      docusate sodium  (COLACE) 100 MG capsule Take 1 capsule (100 mg total) by mouth 2 (two) times daily. (Patient taking differently: Take 100 mg by mouth at bedtime.) 10 capsule 0   Ferrous Fumarate (HEMOCYTE - 106 MG FE) 324 (106 Fe) MG TABS tablet Take 1 tablet by mouth daily at 3 pm.      fluticasone  (FLONASE ) 50 MCG/ACT nasal spray USE 2 SPRAYS IN EACH NOSTRIL TWICE A DAY 48 g 7    Fluticasone -Umeclidin-Vilant (TRELEGY ELLIPTA ) 200-62.5-25 MCG/ACT AEPB Inhale 1 puff into the lungs daily. 60 each 3   folic acid  (FOLVITE ) 1 MG tablet Take 1 mg by mouth daily.     gabapentin  (NEURONTIN ) 300 MG capsule TAKE 2 CAPSULES THREE TIMES A DAY 540 capsule 3   gabapentin  (NEURONTIN ) 400 MG capsule Take 2 capsules (800 mg total) by mouth 3 (three) times daily. 540 capsule 1   guaiFENesin  (MUCINEX ) 600 MG 12 hr tablet Take 600 mg by mouth 2 (two) times daily.     hydrocortisone 2.5 % cream Apply 1 application topically daily.     ipratropium (ATROVENT ) 0.06 % nasal spray Place 2 sprays into both nostrils 4 (four) times daily. 15 mL 6   Multiple Vitamin (MULTI-VITAMIN) tablet Take 1 tablet by mouth daily.      mupirocin ointment (BACTROBAN) 2 % Apply 1 application topically daily as needed (wound care).      MYRBETRIQ 50 MG TB24 tablet Take 50 mg by mouth at bedtime.     nicotine polacrilex (COMMIT) 4 MG lozenge Take 4 mg by mouth as needed for smoking cessation.      OXYGEN Inhale 5 L into the lungs continuous.     pantoprazole  (PROTONIX ) 40 MG tablet Take 40 mg by mouth daily.     sodium chloride  (OCEAN) 0.65 % SOLN nasal spray Place 1 spray into both nostrils 2 (two) times daily. As needed during the day     sodium chloride  HYPERTONIC 3 % nebulizer solution Take by nebulization 2 (two) times daily. 4 cc nebulized twice a day 240 mL 5   Vitamin D , Cholecalciferol, 25 MCG (1000 UT) TABS Take 1,000 Units by mouth daily.     No current facility-administered medications for this visit.    REVIEW OF SYSTEMS:   [X]  denotes positive finding, [ ]  denotes negative finding Cardiac  Comments:  Chest pain or chest pressure:    Shortness of breath upon exertion:    Short of breath when lying flat:    Irregular heart rhythm:        Vascular    Pain in calf, thigh, or hip brought on by ambulation:    Pain in feet at night that wakes you up from your sleep:     Blood clot in your veins:     Leg swelling:         Pulmonary    Oxygen at home:    Productive cough:     Wheezing:  Neurologic    Sudden weakness in arms or legs:     Sudden numbness in arms or legs:     Sudden onset of difficulty speaking or slurred speech:    Temporary loss of vision in one eye:     Problems with dizziness:         Gastrointestinal    Blood in stool:     Vomited blood:         Genitourinary    Burning when urinating:     Blood in urine:        Psychiatric    Major depression:         Hematologic    Bleeding problems:    Problems with blood clotting too easily:        Skin    Rashes or ulcers:        Constitutional    Fever or chills:      PHYSICAL EXAM:   Vitals:   01/11/24 1435  BP: (!) 149/68  Pulse: (!) 103  Temp: 97.9 F (36.6 C)  SpO2: 95%  Weight: 89 lb (40.4 kg)  Height: 5' 2 (1.575 m)    GENERAL: The patient is a well-nourished female, in no acute distress. The vital signs are documented above. CARDIAC: There is a regular rate and rhythm.  VASCULAR: Nonpalpable pedal pulse on the left PULMONARY: Non-labored respirations ABDOMEN: Soft and non-tender with normal pitched bowel sounds.  MUSCULOSKELETAL: There are no major deformities or cyanosis. NEUROLOGIC: No focal weakness or paresthesias are detected. SKIN: There are no ulcers or rashes noted. PSYCHIATRIC: The patient has a normal affect.  STUDIES:   I have reviewed the following: Left: Total occlusion noted in the superficial femoral artery. Chronic  occluded left PRX SFA-PRX POP stent.   +-------+-----------+-----------+------------+------------+  ABI/TBIToday's ABIToday's TBIPrevious ABIPrevious TBI  +-------+-----------+-----------+------------+------------+  Right 1.01       .50        .98         .70           +-------+-----------+-----------+------------+------------+  Left  .47        .62        .52         .26            +-------+-----------+-----------+------------+------------+   MEDICAL ISSUES:   PAD: The patient has undergone several prior interventions of the left leg.  Her stent is now occluded.  She does not endorse any symptoms of claudication or rest pain.  She does not have any open wounds.  We will continue with annual surveillance.  She knows to contact me sooner should she develop a nonhealing wound.    Malvina Serene CLORE, MD, FACS [facility] [phone] Pager [phone]

## 2024-01-13 ENCOUNTER — Encounter: Payer: Self-pay | Admitting: Pulmonary Disease

## 2024-01-14 ENCOUNTER — Other Ambulatory Visit: Payer: Self-pay

## 2024-01-14 MED ORDER — AZELASTINE HCL 0.1 % NA SOLN: 1.0000 | Freq: Two times a day (BID) | NASAL | 6 refills | Status: DC

## 2024-01-14 MED ORDER — TRELEGY ELLIPTA 200-62.5-25 MCG/ACT IN AEPB: 1.0000 | INHALATION_SPRAY | Freq: Every day | RESPIRATORY_TRACT | 3 refills | Status: DC

## 2024-01-15 ENCOUNTER — Other Ambulatory Visit: Payer: Self-pay

## 2024-01-15 ENCOUNTER — Telehealth: Payer: Self-pay

## 2024-01-15 DIAGNOSIS — J9611 Chronic respiratory failure with hypoxia: Secondary | ICD-10-CM

## 2024-01-15 NOTE — Telephone Encounter (Signed)
 Copied from CRM [id number]. Topic: Clinical - Order For Equipment >> Jan 15, 2024 11:39 AM Isabell A wrote: Reason for CRM: Spouse states patient oxygen has stopped working - he has ordered a new one, the company [employer] [facility] will be calling to request a prescription, he wanted to inform the provider    Order placed - NFN.

## 2024-01-18 ENCOUNTER — Telehealth: Payer: Self-pay

## 2024-01-18 NOTE — Telephone Encounter (Signed)
 Copied from CRM [id number]. Topic: Clinical - Order For Equipment >> Jan 18, 2024  1:36 PM Corean SAUNDERS wrote: Reason for CRM: Lani from [employer] is requesting an update on the urgent request for oxygen concentrator. Lani states the request was made on 7/8 and 7/11

## 2024-01-18 NOTE — Telephone Encounter (Signed)
 We do not Receive CMNs usually they go to the front fax.

## 2024-01-20 ENCOUNTER — Telehealth: Payer: Self-pay

## 2024-01-20 ENCOUNTER — Telehealth: Payer: Self-pay | Admitting: *Deleted

## 2024-01-20 NOTE — Telephone Encounter (Signed)
 Called Mike at [employer],   He just got the O2 order yesterday, her dermand being tested out at 5L it has to be OOP. Meaning she would have to purchase the O2 at $2,800 Unless she completes a walk test on 4L they can send for 4L. For insurance purposes.

## 2024-01-20 NOTE — Telephone Encounter (Signed)
 Patient left message that she received inhaler and nasal spray. NFN

## 2024-01-20 NOTE — Telephone Encounter (Signed)
 Routing to [place], Dr.O's nurse to call PT to explain status.

## 2024-01-20 NOTE — Telephone Encounter (Signed)
 Copied from CRM [id number]. Topic: Clinical - Order For Equipment >> Jan 18, 2024  1:36 PM Corean SAUNDERS wrote: Reason for CRM: Lani from [employer] is requesting an update on the urgent request for oxygen concentrator. Lani states the request was made on 7/8 and 7/11 >> Jan 20, 2024  2:34 PM Leila BROCKS wrote: Patient [phone] asked if Dr. Neda has submitted a an order for oxygen home concentrator to [place] at [employer] [phone] or [phone] . Patient and husband have been calling the office, Troy at [employer] has not received the order. Patient states her oxygen machine is broken, currently using portable oxygen machine. Patient is frustrated, has been contacting the office with no response. Warm transferred to [facility].  >> Jan 20, 2024  1:14 PM Russell PARAS wrote: Lani, with [employer], is contacting clinic for update regarding the urgent request for portable oxygen concentrator. Advised an order was placed on 07/11; however, he states it has not been received.   Requests call back and if order could be resent   CB#  [phone]  FX#  [phone]   Spoke with troy he emailed me paperwork , Dr. MALVA signed and has been faxed back to [facility]

## 2024-01-20 NOTE — Telephone Encounter (Signed)
 Spoke with patient and Beth Daniels from [employer] called to confirm her order is on the way

## 2024-01-20 NOTE — Telephone Encounter (Signed)
 PT calling [facility] upset her O2 order has not been processed. Troy at [facility] said he needed an order that we sent 7/14. Raven will call someone at [employer] to see what is holding them up. Lani is @ [phone] if we need to speak with him @ [employer]. Please call PT to advise.

## 2024-02-03 ENCOUNTER — Telehealth (HOSPITAL_COMMUNITY): Payer: Self-pay

## 2024-02-03 NOTE — Telephone Encounter (Signed)
 Auth Submission: NO AUTH NEEDED Site of care: Site of care: [facility] Payer: Medicare A/B, [employer] Medication & CPT/J Code(s) submitted: Prolia  (Denosumab ) [id number] Diagnosis Code: M81.0 Route of submission (phone, fax, portal):  Phone # Fax # Auth type: Buy/Bill HB Units/visits requested: 60mg  x 2 doses, q6 months Reference number:  Approval from: 02/03/24 to 08/06/24   Medicare A/B will cover 80%. [employer] will cover remaining 20%.

## 2024-02-08 ENCOUNTER — Other Ambulatory Visit (HOSPITAL_COMMUNITY): Payer: Self-pay

## 2024-02-09 ENCOUNTER — Ambulatory Visit (HOSPITAL_COMMUNITY)
Admission: RE | Admit: 2024-02-09 | Discharge: 2024-02-09 | Disposition: A | Discharge status: Home / Self Care | Source: Ambulatory Visit | Attending: Internal Medicine | Admitting: Internal Medicine

## 2024-02-09 DIAGNOSIS — M810 Age-related osteoporosis without current pathological fracture: Secondary | ICD-10-CM | POA: Diagnosis present

## 2024-02-09 MED ORDER — DENOSUMAB 60 MG/ML ~~LOC~~ SOSY
PREFILLED_SYRINGE | SUBCUTANEOUS | Status: AC
  Filled 2024-02-09: qty 1

## 2024-02-09 MED ORDER — DENOSUMAB 60 MG/ML ~~LOC~~ SOSY
60.0000 mg | PREFILLED_SYRINGE | Freq: Once | SUBCUTANEOUS | Status: AC
  Administered 2024-02-09: 60 mg via SUBCUTANEOUS

## 2024-02-10 ENCOUNTER — Other Ambulatory Visit: Payer: Self-pay | Admitting: Adult Health

## 2024-02-18 NOTE — Telephone Encounter (Signed)
 Will fill but can we see if she was seen by pain management? If so, they should take over prescribing otherwise she will need to be scheduled for a f/u visit. Thank you.

## 2024-02-18 NOTE — Telephone Encounter (Signed)
 Spoke w/Pt regarding NP note. Pt stated she has not been to the pain clinic, she never received a referral nor a call for the pain clinic that her neuropathy has always been managed by her neurologist. Explained it was discussed in her last visit with NP, but Pt stated she didn't recall that. Informed Pt we will refill gabapentin  but we need to schedule her for a f/u visit. Pt requested appt with MD not NP. Was able to schedule appt with Dr. Onita who has previously seen Pt. Pt voiced understanding.

## 2024-02-27 ENCOUNTER — Inpatient Hospital Stay (HOSPITAL_COMMUNITY)
Admission: EM | Admit: 2024-02-27 | Discharge: 2024-03-07 | DRG: 870 | Disposition: E | Attending: Pulmonary Disease | Admitting: Pulmonary Disease

## 2024-02-27 ENCOUNTER — Other Ambulatory Visit: Payer: Self-pay

## 2024-02-27 ENCOUNTER — Emergency Department (HOSPITAL_COMMUNITY)

## 2024-02-27 ENCOUNTER — Encounter (HOSPITAL_COMMUNITY): Payer: Self-pay | Admitting: Internal Medicine

## 2024-02-27 DIAGNOSIS — Z66 Do not resuscitate: Secondary | ICD-10-CM | POA: Diagnosis present

## 2024-02-27 DIAGNOSIS — Z681 Body mass index (BMI) 19 or less, adult: Secondary | ICD-10-CM | POA: Diagnosis not present

## 2024-02-27 DIAGNOSIS — D638 Anemia in other chronic diseases classified elsewhere: Secondary | ICD-10-CM

## 2024-02-27 DIAGNOSIS — J439 Emphysema, unspecified: Secondary | ICD-10-CM | POA: Diagnosis present

## 2024-02-27 DIAGNOSIS — I4891 Unspecified atrial fibrillation: Secondary | ICD-10-CM | POA: Diagnosis not present

## 2024-02-27 DIAGNOSIS — G7281 Critical illness myopathy: Secondary | ICD-10-CM | POA: Diagnosis present

## 2024-02-27 DIAGNOSIS — J441 Chronic obstructive pulmonary disease with (acute) exacerbation: Secondary | ICD-10-CM | POA: Diagnosis present

## 2024-02-27 DIAGNOSIS — R54 Age-related physical debility: Secondary | ICD-10-CM | POA: Diagnosis present

## 2024-02-27 DIAGNOSIS — J151 Pneumonia due to Pseudomonas: Secondary | ICD-10-CM | POA: Diagnosis present

## 2024-02-27 DIAGNOSIS — J9611 Chronic respiratory failure with hypoxia: Secondary | ICD-10-CM | POA: Diagnosis present

## 2024-02-27 DIAGNOSIS — C50511 Malignant neoplasm of lower-outer quadrant of right female breast: Secondary | ICD-10-CM | POA: Diagnosis present

## 2024-02-27 DIAGNOSIS — J9622 Acute and chronic respiratory failure with hypercapnia: Secondary | ICD-10-CM | POA: Diagnosis present

## 2024-02-27 DIAGNOSIS — J9601 Acute respiratory failure with hypoxia: Principal | ICD-10-CM

## 2024-02-27 DIAGNOSIS — Z781 Physical restraint status: Secondary | ICD-10-CM

## 2024-02-27 DIAGNOSIS — R6520 Severe sepsis without septic shock: Secondary | ICD-10-CM

## 2024-02-27 DIAGNOSIS — J189 Pneumonia, unspecified organism: Secondary | ICD-10-CM | POA: Diagnosis not present

## 2024-02-27 DIAGNOSIS — Z87891 Personal history of nicotine dependence: Secondary | ICD-10-CM

## 2024-02-27 DIAGNOSIS — E871 Hypo-osmolality and hyponatremia: Secondary | ICD-10-CM | POA: Diagnosis present

## 2024-02-27 DIAGNOSIS — J9621 Acute and chronic respiratory failure with hypoxia: Secondary | ICD-10-CM | POA: Diagnosis present

## 2024-02-27 DIAGNOSIS — M810 Age-related osteoporosis without current pathological fracture: Secondary | ICD-10-CM | POA: Diagnosis present

## 2024-02-27 DIAGNOSIS — D630 Anemia in neoplastic disease: Secondary | ICD-10-CM | POA: Diagnosis present

## 2024-02-27 DIAGNOSIS — A419 Sepsis, unspecified organism: Secondary | ICD-10-CM | POA: Diagnosis present

## 2024-02-27 DIAGNOSIS — I272 Pulmonary hypertension, unspecified: Secondary | ICD-10-CM | POA: Diagnosis present

## 2024-02-27 DIAGNOSIS — Z79899 Other long term (current) drug therapy: Secondary | ICD-10-CM

## 2024-02-27 DIAGNOSIS — J181 Lobar pneumonia, unspecified organism: Secondary | ICD-10-CM | POA: Insufficient documentation

## 2024-02-27 DIAGNOSIS — E875 Hyperkalemia: Secondary | ICD-10-CM | POA: Diagnosis not present

## 2024-02-27 DIAGNOSIS — E43 Unspecified severe protein-calorie malnutrition: Secondary | ICD-10-CM | POA: Diagnosis present

## 2024-02-27 DIAGNOSIS — J440 Chronic obstructive pulmonary disease with acute lower respiratory infection: Secondary | ICD-10-CM | POA: Diagnosis present

## 2024-02-27 DIAGNOSIS — Z515 Encounter for palliative care: Secondary | ICD-10-CM

## 2024-02-27 DIAGNOSIS — Z9841 Cataract extraction status, right eye: Secondary | ICD-10-CM

## 2024-02-27 DIAGNOSIS — I739 Peripheral vascular disease, unspecified: Secondary | ICD-10-CM | POA: Diagnosis present

## 2024-02-27 DIAGNOSIS — Z7982 Long term (current) use of aspirin: Secondary | ICD-10-CM

## 2024-02-27 DIAGNOSIS — R6521 Severe sepsis with septic shock: Secondary | ICD-10-CM | POA: Diagnosis present

## 2024-02-27 DIAGNOSIS — J9602 Acute respiratory failure with hypercapnia: Secondary | ICD-10-CM | POA: Diagnosis present

## 2024-02-27 DIAGNOSIS — Z923 Personal history of irradiation: Secondary | ICD-10-CM

## 2024-02-27 DIAGNOSIS — I452 Bifascicular block: Secondary | ICD-10-CM | POA: Diagnosis present

## 2024-02-27 DIAGNOSIS — Z820 Family history of epilepsy and other diseases of the nervous system: Secondary | ICD-10-CM

## 2024-02-27 DIAGNOSIS — I959 Hypotension, unspecified: Secondary | ICD-10-CM | POA: Diagnosis not present

## 2024-02-27 DIAGNOSIS — Z9842 Cataract extraction status, left eye: Secondary | ICD-10-CM

## 2024-02-27 DIAGNOSIS — D649 Anemia, unspecified: Secondary | ICD-10-CM | POA: Diagnosis not present

## 2024-02-27 DIAGNOSIS — G9341 Metabolic encephalopathy: Secondary | ICD-10-CM | POA: Diagnosis present

## 2024-02-27 DIAGNOSIS — A4152 Sepsis due to Pseudomonas: Secondary | ICD-10-CM | POA: Diagnosis present

## 2024-02-27 DIAGNOSIS — J9620 Acute and chronic respiratory failure, unspecified whether with hypoxia or hypercapnia: Secondary | ICD-10-CM | POA: Diagnosis present

## 2024-02-27 DIAGNOSIS — C50519 Malignant neoplasm of lower-outer quadrant of unspecified female breast: Secondary | ICD-10-CM | POA: Diagnosis present

## 2024-02-27 DIAGNOSIS — Z7951 Long term (current) use of inhaled steroids: Secondary | ICD-10-CM

## 2024-02-27 DIAGNOSIS — F1020 Alcohol dependence, uncomplicated: Secondary | ICD-10-CM | POA: Diagnosis present

## 2024-02-27 DIAGNOSIS — Z9981 Dependence on supplemental oxygen: Secondary | ICD-10-CM

## 2024-02-27 DIAGNOSIS — F419 Anxiety disorder, unspecified: Secondary | ICD-10-CM | POA: Diagnosis present

## 2024-02-27 DIAGNOSIS — Z9013 Acquired absence of bilateral breasts and nipples: Secondary | ICD-10-CM

## 2024-02-27 DIAGNOSIS — R64 Cachexia: Secondary | ICD-10-CM | POA: Diagnosis present

## 2024-02-27 DIAGNOSIS — E8770 Fluid overload, unspecified: Secondary | ICD-10-CM | POA: Diagnosis present

## 2024-02-27 DIAGNOSIS — Z85118 Personal history of other malignant neoplasm of bronchus and lung: Secondary | ICD-10-CM

## 2024-02-27 DIAGNOSIS — Z79620 Long term (current) use of immunosuppressive biologic: Secondary | ICD-10-CM

## 2024-02-27 DIAGNOSIS — G709 Myoneural disorder, unspecified: Secondary | ICD-10-CM | POA: Diagnosis present

## 2024-02-27 LAB — I-STAT ARTERIAL BLOOD GAS, ED
Acid-Base Excess: 7 mmol/L — ABNORMAL HIGH (ref 0.0–2.0)
Bicarbonate: 35.9 mmol/L — ABNORMAL HIGH (ref 20.0–28.0)
Calcium, Ion: 1.13 mmol/L — ABNORMAL LOW (ref 1.15–1.40)
HCT: 24 % — ABNORMAL LOW (ref 36.0–46.0)
Hemoglobin: 8.2 g/dL — ABNORMAL LOW (ref 12.0–15.0)
O2 Saturation: 98 %
Patient temperature: 98.4
Potassium: 4.5 mmol/L (ref 3.5–5.1)
Sodium: 131 mmol/L — ABNORMAL LOW (ref 135–145)
TCO2: 38 mmol/L — ABNORMAL HIGH (ref 22–32)
pCO2 arterial: 81.3 mmHg (ref 32–48)
pH, Arterial: 7.253 — ABNORMAL LOW (ref 7.35–7.45)
pO2, Arterial: 127 mmHg — ABNORMAL HIGH (ref 83–108)

## 2024-02-27 LAB — RESPIRATORY PANEL BY PCR

## 2024-02-27 LAB — BASIC METABOLIC PANEL WITH GFR
Anion gap: 13 (ref 5–15)
BUN: 15 mg/dL (ref 8–23)
CO2: 28 mmol/L (ref 22–32)
Calcium: 7.9 mg/dL — ABNORMAL LOW (ref 8.9–10.3)
Chloride: 93 mmol/L — ABNORMAL LOW (ref 98–111)
Creatinine, Ser: 0.47 mg/dL (ref 0.44–1.00)
GFR, Estimated: >60 mL/min (ref >60)
Glucose, Bld: 90 mg/dL (ref 70–99)
Potassium: 5 mmol/L (ref 3.5–5.1)
Sodium: 134 mmol/L — ABNORMAL LOW (ref 135–145)

## 2024-02-27 LAB — RESP PANEL BY RT-PCR (RSV, FLU A&B, COVID)  RVPGX2
Influenza A by PCR: NEGATIVE
Influenza B by PCR: NEGATIVE
Resp Syncytial Virus by PCR: NEGATIVE
SARS Coronavirus 2 by RT PCR: NEGATIVE

## 2024-02-27 LAB — COMPREHENSIVE METABOLIC PANEL WITH GFR
ALT: 19 U/L (ref 0–44)
AST: 32 U/L (ref 15–41)
Albumin: 2.6 g/dL — ABNORMAL LOW (ref 3.5–5.0)
Alkaline Phosphatase: 115 U/L (ref 38–126)
Anion gap: 10 (ref 5–15)
BUN: 16 mg/dL (ref 8–23)
CO2: 33 mmol/L — ABNORMAL HIGH (ref 22–32)
Calcium: 8 mg/dL — ABNORMAL LOW (ref 8.9–10.3)
Chloride: 90 mmol/L — ABNORMAL LOW (ref 98–111)
Creatinine, Ser: 0.45 mg/dL (ref 0.44–1.00)
GFR, Estimated: >60 mL/min (ref >60)
Glucose, Bld: 94 mg/dL (ref 70–99)
Potassium: 5.7 mmol/L — ABNORMAL HIGH (ref 3.5–5.1)
Sodium: 133 mmol/L — ABNORMAL LOW (ref 135–145)
Total Bilirubin: 0.6 mg/dL (ref 0.0–1.2)
Total Protein: 5.8 g/dL — ABNORMAL LOW (ref 6.5–8.1)

## 2024-02-27 LAB — CBC WITH DIFFERENTIAL/PLATELET
Abs Granulocyte: 11.6 K/uL — ABNORMAL HIGH (ref 1.5–6.5)
Abs Immature Granulocytes: 0.32 K/uL — ABNORMAL HIGH (ref 0.00–0.07)
Basophils Absolute: 0 K/uL (ref 0.0–0.1)
Basophils Relative: 0 %
Eosinophils Absolute: 0 K/uL (ref 0.0–0.5)
Eosinophils Relative: 0 %
HCT: 26.4 % — ABNORMAL LOW (ref 36.0–46.0)
Hemoglobin: 7.7 g/dL — ABNORMAL LOW (ref 12.0–15.0)
Immature Granulocytes: 2 %
Lymphocytes Relative: 2 %
Lymphs Abs: 0.3 K/uL — ABNORMAL LOW (ref 0.7–4.0)
MCH: 31.8 pg (ref 26.0–34.0)
MCHC: 29.2 g/dL — ABNORMAL LOW (ref 30.0–36.0)
MCV: 109.1 fL — ABNORMAL HIGH (ref 80.0–100.0)
Monocytes Absolute: 1.3 K/uL — ABNORMAL HIGH (ref 0.1–1.0)
Monocytes Relative: 10 %
Neutro Abs: 11.6 K/uL — ABNORMAL HIGH (ref 1.7–7.7)
Neutrophils Relative %: 86 %
Platelets: 193 K/uL (ref 150–400)
RBC: 2.42 MIL/uL — ABNORMAL LOW (ref 3.87–5.11)
RDW: 14 % (ref 11.5–15.5)
Smear Review: NORMAL
WBC: 13.6 K/uL — ABNORMAL HIGH (ref 4.0–10.5)
nRBC: 0 % (ref 0.0–0.2)

## 2024-02-27 LAB — URINALYSIS, W/ REFLEX TO CULTURE (INFECTION SUSPECTED)
Bacteria, UA: NONE SEEN
Bilirubin Urine: NEGATIVE
Glucose, UA: NEGATIVE mg/dL
Hgb urine dipstick: NEGATIVE
Ketones, ur: 20 mg/dL — AB
Leukocytes,Ua: NEGATIVE
Nitrite: NEGATIVE
Protein, ur: 30 mg/dL — AB
Specific Gravity, Urine: 1.014 (ref 1.005–1.030)
pH: 6 (ref 5.0–8.0)

## 2024-02-27 LAB — CBG MONITORING, ED: Glucose-Capillary: 93 mg/dL (ref 70–99)

## 2024-02-27 LAB — MRSA NEXT GEN BY PCR, NASAL: MRSA by PCR Next Gen: NOT DETECTED

## 2024-02-27 LAB — GLUCOSE, CAPILLARY
Glucose-Capillary: 110 mg/dL — ABNORMAL HIGH (ref 70–99)
Glucose-Capillary: 127 mg/dL — ABNORMAL HIGH (ref 70–99)
Glucose-Capillary: 83 mg/dL (ref 70–99)

## 2024-02-27 LAB — LACTIC ACID, PLASMA
Lactic Acid, Venous: 2.5 mmol/L (ref 0.5–1.9)
Lactic Acid, Venous: 1.1 mmol/L (ref 0.5–1.9)

## 2024-02-27 LAB — MAGNESIUM: Magnesium: 1.7 mg/dL (ref 1.7–2.4)

## 2024-02-27 LAB — HEMOGLOBIN A1C
Hgb A1c MFr Bld: 4 % — ABNORMAL LOW (ref 4.8–5.6)
Mean Plasma Glucose: 68.1 mg/dL

## 2024-02-27 LAB — I-STAT CG4 LACTIC ACID, ED: Lactic Acid, Venous: 2 mmol/L (ref 0.5–1.9)

## 2024-02-27 LAB — STREP PNEUMONIAE URINARY ANTIGEN: Strep Pneumo Urinary Antigen: NEGATIVE

## 2024-02-27 LAB — PROTIME-INR
INR: 1 (ref 0.8–1.2)
Prothrombin Time: 14.1 s (ref 11.4–15.2)

## 2024-02-27 MED ORDER — ROCURONIUM BROMIDE 10 MG/ML (PF) SYRINGE
PREFILLED_SYRINGE | INTRAVENOUS | Status: AC | PRN
  Administered 2024-02-27: 70 mg via INTRAVENOUS

## 2024-02-27 MED ORDER — METHYLPREDNISOLONE SODIUM SUCC 40 MG IJ SOLR
40.0000 mg | INTRAMUSCULAR | Status: DC
  Administered 2024-02-28 – 2024-03-01 (×3): 40 mg via INTRAVENOUS
  Filled 2024-02-27 (×3): qty 1

## 2024-02-27 MED ORDER — FENTANYL CITRATE PF 50 MCG/ML IJ SOSY
50.0000 ug | PREFILLED_SYRINGE | INTRAMUSCULAR | Status: DC | PRN
  Administered 2024-02-27 – 2024-03-01 (×2): 50 ug via INTRAVENOUS
  Filled 2024-02-27 (×2): qty 1

## 2024-02-27 MED ORDER — ETOMIDATE 2 MG/ML IV SOLN
INTRAVENOUS | Status: AC | PRN
  Administered 2024-02-27: 15 mg via INTRAVENOUS

## 2024-02-27 MED ORDER — SODIUM CHLORIDE 0.9 % IV SOLN
500.0000 mg | INTRAVENOUS | Status: DC
  Administered 2024-02-28 – 2024-02-29 (×2): 500 mg via INTRAVENOUS
  Filled 2024-02-27 (×2): qty 5

## 2024-02-27 MED ORDER — ORAL CARE MOUTH RINSE: 15.0000 mL | OROMUCOSAL | Status: DC | PRN

## 2024-02-27 MED ORDER — NOREPINEPHRINE 4 MG/250ML-% IV SOLN
0.0000 ug/min | INTRAVENOUS | Status: DC
  Administered 2024-02-28: 6 ug/min via INTRAVENOUS
  Filled 2024-02-27 (×2): qty 250

## 2024-02-27 MED ORDER — PROPOFOL 1000 MG/100ML IV EMUL
0.0000 ug/kg/min | INTRAVENOUS | Status: DC
  Administered 2024-02-27: 10 ug/kg/min via INTRAVENOUS
  Administered 2024-02-28: 50 ug/kg/min via INTRAVENOUS
  Administered 2024-02-28 (×2): 35 ug/kg/min via INTRAVENOUS
  Administered 2024-02-29: 50 ug/kg/min via INTRAVENOUS
  Filled 2024-02-27: qty 200
  Filled 2024-02-27 (×3): qty 100

## 2024-02-27 MED ORDER — ALBUMIN HUMAN 25 % IV SOLN
25.0000 g | Freq: Once | INTRAVENOUS | Status: AC
  Administered 2024-02-27: 12.5 g via INTRAVENOUS
  Filled 2024-02-27: qty 100

## 2024-02-27 MED ORDER — METHYLPREDNISOLONE SODIUM SUCC 40 MG IJ SOLR: 40.0000 mg | Freq: Two times a day (BID) | INTRAMUSCULAR | Status: DC

## 2024-02-27 MED ORDER — SODIUM CHLORIDE 0.9 % IV BOLUS (SEPSIS)
1000.0000 mL | Freq: Once | INTRAVENOUS | Status: AC
  Administered 2024-02-27: 1000 mL via INTRAVENOUS

## 2024-02-27 MED ORDER — SODIUM CHLORIDE 0.9 % IV BOLUS (SEPSIS)
250.0000 mL | Freq: Once | INTRAVENOUS | Status: AC
  Administered 2024-02-27: 250 mL via INTRAVENOUS

## 2024-02-27 MED ORDER — SODIUM CHLORIDE 0.9 % IV SOLN
250.0000 mL | INTRAVENOUS | Status: AC
  Administered 2024-02-27: 250 mL via INTRAVENOUS

## 2024-02-27 MED ORDER — MAGNESIUM SULFATE 2 GM/50ML IV SOLN
2.0000 g | Freq: Once | INTRAVENOUS | Status: AC
  Administered 2024-02-27: 2 g via INTRAVENOUS
  Filled 2024-02-27: qty 50

## 2024-02-27 MED ORDER — FAMOTIDINE 20 MG PO TABS
20.0000 mg | ORAL_TABLET | Freq: Every day | ORAL | Status: DC
  Administered 2024-02-28 – 2024-03-01 (×3): 20 mg
  Filled 2024-02-27 (×3): qty 1

## 2024-02-27 MED ORDER — CHLORHEXIDINE GLUCONATE CLOTH 2 % EX PADS
6.0000 | MEDICATED_PAD | Freq: Every day | CUTANEOUS | Status: DC
  Administered 2024-02-27 – 2024-03-01 (×5): 6 via TOPICAL

## 2024-02-27 MED ORDER — POLYETHYLENE GLYCOL 3350 17 G PO PACK: 17.0000 g | PACK | Freq: Every day | ORAL | Status: DC | PRN

## 2024-02-27 MED ORDER — ORAL CARE MOUTH RINSE
15.0000 mL | OROMUCOSAL | Status: DC
  Administered 2024-02-27 – 2024-03-02 (×43): 15 mL via OROMUCOSAL

## 2024-02-27 MED ORDER — SODIUM CHLORIDE 0.9 % IV SOLN
500.0000 mg | Freq: Once | INTRAVENOUS | Status: AC
  Administered 2024-02-27: 500 mg via INTRAVENOUS
  Filled 2024-02-27: qty 5

## 2024-02-27 MED ORDER — HEPARIN SODIUM (PORCINE) 5000 UNIT/ML IJ SOLN
5000.0000 [IU] | Freq: Three times a day (TID) | INTRAMUSCULAR | Status: DC
  Administered 2024-02-27 – 2024-03-02 (×12): 5000 [IU] via SUBCUTANEOUS
  Filled 2024-02-27 (×10): qty 1

## 2024-02-27 MED ORDER — IPRATROPIUM-ALBUTEROL 0.5-2.5 (3) MG/3ML IN SOLN
3.0000 mL | Freq: Four times a day (QID) | RESPIRATORY_TRACT | Status: DC
  Administered 2024-02-27 – 2024-02-29 (×8): 3 mL via RESPIRATORY_TRACT
  Filled 2024-02-27 (×7): qty 3

## 2024-02-27 MED ORDER — FAMOTIDINE 20 MG PO TABS
20.0000 mg | ORAL_TABLET | Freq: Two times a day (BID) | ORAL | Status: DC
  Administered 2024-02-27: 20 mg
  Filled 2024-02-27: qty 1

## 2024-02-27 MED ORDER — INSULIN ASPART 100 UNIT/ML IJ SOLN
0.0000 [IU] | INTRAMUSCULAR | Status: DC
  Administered 2024-02-28: 3 [IU] via SUBCUTANEOUS
  Administered 2024-02-28 (×2): 2 [IU] via SUBCUTANEOUS
  Administered 2024-02-28 – 2024-02-29 (×2): 3 [IU] via SUBCUTANEOUS
  Administered 2024-02-29 (×3): 2 [IU] via SUBCUTANEOUS
  Administered 2024-03-01: 3 [IU] via SUBCUTANEOUS
  Administered 2024-03-01 (×2): 2 [IU] via SUBCUTANEOUS
  Administered 2024-03-01: 3 [IU] via SUBCUTANEOUS
  Administered 2024-03-01 – 2024-03-02 (×2): 2 [IU] via SUBCUTANEOUS

## 2024-02-27 MED ORDER — FENTANYL CITRATE PF 50 MCG/ML IJ SOSY
50.0000 ug | PREFILLED_SYRINGE | INTRAMUSCULAR | Status: DC | PRN
  Administered 2024-02-28 (×4): 100 ug via INTRAVENOUS
  Administered 2024-02-28: 150 ug via INTRAVENOUS
  Administered 2024-02-28: 100 ug via INTRAVENOUS
  Administered 2024-02-29: 150 ug via INTRAVENOUS
  Administered 2024-02-29 (×3): 100 ug via INTRAVENOUS
  Administered 2024-02-29: 150 ug via INTRAVENOUS
  Administered 2024-03-01 (×4): 100 ug via INTRAVENOUS
  Administered 2024-03-01: 150 ug via INTRAVENOUS
  Administered 2024-03-01: 200 ug via INTRAVENOUS
  Administered 2024-03-01 (×4): 100 ug via INTRAVENOUS
  Administered 2024-03-02: 50 ug via INTRAVENOUS
  Administered 2024-03-02: 200 ug via INTRAVENOUS
  Administered 2024-03-02 (×3): 100 ug via INTRAVENOUS
  Filled 2024-02-27: qty 3
  Filled 2024-02-27 (×2): qty 2
  Filled 2024-02-27: qty 1
  Filled 2024-02-27 (×3): qty 3
  Filled 2024-02-27: qty 4
  Filled 2024-02-27 (×5): qty 2
  Filled 2024-02-27: qty 4
  Filled 2024-02-27 (×12): qty 2

## 2024-02-27 MED ORDER — NOREPINEPHRINE 4 MG/250ML-% IV SOLN
INTRAVENOUS | Status: AC
  Administered 2024-02-27: 4 mg via INTRAVENOUS
  Filled 2024-02-27: qty 250

## 2024-02-27 MED ORDER — SODIUM CHLORIDE 0.9 % IV SOLN
2.0000 g | Freq: Once | INTRAVENOUS | Status: AC
  Administered 2024-02-27: 2 g via INTRAVENOUS
  Filled 2024-02-27: qty 20

## 2024-02-27 MED ORDER — DOCUSATE SODIUM 100 MG PO CAPS: 100.0000 mg | ORAL_CAPSULE | Freq: Two times a day (BID) | ORAL | Status: DC | PRN

## 2024-02-27 MED ORDER — METHYLPREDNISOLONE SODIUM SUCC 125 MG IJ SOLR
125.0000 mg | Freq: Once | INTRAMUSCULAR | Status: AC
  Administered 2024-02-27: 125 mg via INTRAVENOUS
  Filled 2024-02-27: qty 2

## 2024-02-27 MED ORDER — SODIUM CHLORIDE 0.9 % IV SOLN
1.0000 g | INTRAVENOUS | Status: DC
  Administered 2024-02-28: 1 g via INTRAVENOUS
  Filled 2024-02-27: qty 10

## 2024-02-27 NOTE — Progress Notes (Addendum)
 eLink Physician-Brief Progress Note Patient Name: Beth Daniels DOB: 01/18/1946 MRN: [id number]   Date of Service  02/27/2024  HPI/Events of Note  78 year old female with chronic hypoxic/hypercapnic respiratory failure due to severe COPD on home oxygen 6 L/min, breast cancer on radiation therapy, who was brought to the emergency department after she was found lethargic and unresponsive at home.   Patient has shock secondary to above.  Imaging consistent with some degree of fluid overload in addition to multifocal airspace disease severe emphysema.  eICU Interventions  Maintain fluid restrictive approach.  Titrate pressors to systolic greater than 100 in the setting of extremely wide pulse pressures.  Trend lactic in the morning.   2104 -add restraints for patient's safety  Intervention Category Intermediate Interventions: Hypotension - evaluation and management  Shavelle Runkel 02/27/2024, 7:51 PM

## 2024-02-27 NOTE — ED Provider Notes (Signed)
 Procedure Name: Intubation Date/Time: 02/27/2024 11:27 AM  Performed by: Myriam Dorn BROCKS, PAPre-anesthesia Checklist: Patient identified, Emergency Drugs available, Suction available, Patient being monitored and Timeout performed Oxygen Delivery Method: Ambu bag Preoxygenation: Pre-oxygenation with 100% oxygen Induction Type: IV induction and Rapid sequence Ventilation: Mask ventilation without difficulty Laryngoscope Size: Glidescope and 3 Grade View: Grade III Nasal Tubes: Right Tube size: 7.5 mm Number of attempts: 1 Airway Equipment and Method: Stylet Placement Confirmation: ETT inserted through vocal cords under direct vision, CO2 detector and Breath sounds checked- equal and bilateral Secured at: 24 cm Tube secured with: ETT holder        Myriam Dorn BROCKS, PA 02/27/24 1129    Charlyn Sora, MD 02/27/24 1208

## 2024-02-27 NOTE — ED Provider Notes (Addendum)
 [place] EMERGENCY DEPARTMENT AT [facility] Provider Note   CSN: [id number] Arrival date & time: 02/27/24  1104     Patient presents with: Respiratory Arrest   Beth Daniels is a 78 y.o. female.   HPI    78 year old patient with history of COPD on 6 L of oxygen constantly, breast cancer undergoing radiation therapy comes in with chief complaint of weakness.  According to EMS, patient has been sick for the last 3 days.  Today however she was unresponsive and lethargic, prompting family to call 911.  Patient is full code.  Patient was minimally responsive upon arrival, however patient then started becoming more somnolent and they had to initiate BVM.  Patient has full code as far as they know.  Patient is coming from home.  Subsequently, I spoke with family.  Patient has had a  head cold for 2 to 3 days and has been spitting up phlegm.  Today however she was lethargic and minimally responsive.  She had stopped coughing as well.  This was concerning to family and they called 911.  Husband indicates that patient would not want chest compressions.  We will enter DNR order.  Prior to Admission medications   Medication Sig Start Date End Date Taking? Authorizing Provider  albuterol  (PROVENTIL ) (2.5 MG/3ML) 0.083% nebulizer solution INHALE CONTENTS OF 1 VIAL VIA NEBULIZER EVERY 4 HOURS AS NEEDED FOR WHEEZING OR SHORTNESS OF BREATH 12/26/23  Yes Olalere, Adewale A, MD  albuterol  (VENTOLIN  HFA) 108 (90 Base) MCG/ACT inhaler USE 1 INHALATION EVERY 6 HOURS AS NEEDED FOR WHEEZING OR SHORTNESS OF BREATH 05/22/23  Yes Olalere, Adewale A, MD  aspirin  EC 81 MG tablet Take 81 mg by mouth daily.    [provider]  atorvastatin  (LIPITOR) 40 MG tablet Take 40 mg by mouth daily. 07/17/19   [provider]  azelastine  (ASTELIN ) 0.1 % nasal spray Place 1 spray into both nostrils 2 (two) times daily. 01/14/24   Neda Jennet LABOR, MD  Azelastine -Fluticasone  137-50 MCG/ACT SUSP  Place 1 spray into the nose daily. 07/17/23   Neda Jennet LABOR, MD  conjugated estrogens (PREMARIN) vaginal cream Place 1 Applicatorful vaginally every Monday, Wednesday, and Friday at 8 PM. 12/20/18   [provider]  Cranberry 400 MG CAPS Take 1,200 mg by mouth daily at 2 PM.    [provider]  denosumab  (PROLIA ) 60 MG/ML SOSY injection Inject 60 mg into the skin every 6 (six) months.     [provider]  docusate sodium  (COLACE) 100 MG capsule Take 1 capsule (100 mg total) by mouth 2 (two) times daily. Patient taking differently: Take 100 mg by mouth at bedtime. 02/01/19   Maree, Pratik D, DO  Ferrous Fumarate (HEMOCYTE - 106 MG FE) 324 (106 Fe) MG TABS tablet Take 1 tablet by mouth daily at 3 pm.  02/09/18   [provider]  fluticasone  (FLONASE ) 50 MCG/ACT nasal spray USE 2 SPRAYS IN EACH NOSTRIL TWICE A DAY 11/26/22   Icard, Bradley L, DO  Fluticasone -Umeclidin-Vilant (TRELEGY ELLIPTA ) 200-62.5-25 MCG/ACT AEPB Inhale 1 puff into the lungs daily. 01/14/24   Olalere, Jennet LABOR, MD  folic acid  (FOLVITE ) 1 MG tablet Take 1 mg by mouth daily.    [provider]  gabapentin  (NEURONTIN ) 400 MG capsule TAKE 2 CAPSULES THREE TIMES A DAY 02/18/24   Whitfield Raisin, NP  guaiFENesin  (MUCINEX ) 600 MG 12 hr tablet Take 600 mg by mouth 2 (two) times daily.    [provider]  hydrocortisone 2.5 % cream Apply 1 application topically daily. 01/29/21   [provider]  ipratropium (ATROVENT ) 0.06 % nasal spray Place 2 sprays into both nostrils 4 (four) times daily. 07/17/23   Neda Jennet LABOR, MD  Multiple Vitamin (MULTI-VITAMIN) tablet Take 1 tablet by mouth daily.     [provider]  mupirocin ointment (BACTROBAN) 2 % Apply 1 application topically daily as needed (wound care).  08/28/16   [provider]  MYRBETRIQ 50 MG TB24 tablet Take 50 mg by mouth at bedtime. 07/12/20   [provider]  OXYGEN Inhale 5 L into the lungs  continuous.    [provider]  pantoprazole  (PROTONIX ) 40 MG tablet Take 40 mg by mouth daily. 12/01/18   [provider]  sodium chloride  (OCEAN) 0.65 % SOLN nasal spray Place 1 spray into both nostrils 2 (two) times daily. As needed during the day    [provider]  sodium chloride  HYPERTONIC 3 % nebulizer solution Take by nebulization 2 (two) times daily. 4 cc nebulized twice a day 09/21/23   Neda Jennet A, MD  Vitamin D , Cholecalciferol, 25 MCG (1000 UT) TABS Take 1,000 Units by mouth daily.    [provider]    Allergies: Patient has no known allergies.    Review of Systems  All other systems reviewed and are negative.   Updated Vital Signs BP 112/60   Pulse 89   Temp (!) 95.1 F (35.1 C) (Tympanic)   Resp (!) 26   Ht 5' 2 (1.575 m)   Wt 40 kg   SpO2 100%   BMI 16.13 kg/m   Physical Exam Vitals and nursing note reviewed.  Constitutional:      Appearance: She is well-developed.     Comments: Somnolent  Cardiovascular:     Rate and Rhythm: Normal rate.  Pulmonary:     Effort: Pulmonary effort is normal.     Breath sounds: Rhonchi present.  Musculoskeletal:     Cervical back: Neck supple.  Skin:    General: Skin is warm.  Neurological:     Mental Status: She is disoriented.     (all labs ordered are listed, but only abnormal results are displayed) Labs Reviewed  I-STAT CG4 LACTIC ACID, ED - Abnormal; Notable for the following components:      Result Value   Lactic Acid, Venous 2.0 (*)    All other components within normal limits  I-STAT ARTERIAL BLOOD GAS, ED - Abnormal; Notable for the following components:   pH, Arterial 7.253 (*)    pCO2 arterial 81.3 (*)    pO2, Arterial 127 (*)    Bicarbonate 35.9 (*)    TCO2 38 (*)    Acid-Base Excess 7.0 (*)    Sodium 131 (*)    Calcium , Ion 1.13 (*)    HCT 24.0 (*)    Hemoglobin 8.2 (*)    All other components within normal limits  RESP PANEL BY RT-PCR (RSV, FLU A&B,  COVID)  RVPGX2  CULTURE, BLOOD (ROUTINE X 2)  CULTURE, BLOOD (ROUTINE X 2)  PROTIME-INR  COMPREHENSIVE METABOLIC PANEL WITH GFR  CBC WITH DIFFERENTIAL/PLATELET  URINALYSIS, W/ REFLEX TO CULTURE (INFECTION SUSPECTED)  CBG MONITORING, ED  I-STAT CG4 LACTIC ACID, ED    EKG: EKG Interpretation Date/Time:  Saturday February 27 2024 11:16:24 EDT Ventricular Rate:  107 PR Interval:  128 QRS Duration:  137 QT Interval:  356 QTC Calculation: 475 R Axis:   93  Text Interpretation: Sinus tachycardia RBBB and LPFB No acute changes No significant change since last tracing Confirmed by Charlyn Sora [phone]) on 02/27/2024 11:18:10 AM  Radiology: [facility] Chest Port 1 View Result Date: 02/27/2024 CLINICAL DATA:  Questionable sepsis. EXAM: PORTABLE CHEST 1 VIEW COMPARISON:  Chest radiograph dated 07/18/2022. FINDINGS: Endotracheal tube approximately 4 cm above the carina. Enteric tube extends below the diaphragm with tip beyond the inferior margin of the image. Small bilateral pleural effusions and bibasilar atelectasis or infiltrate. There is background of emphysema. Patchy bilateral mid lung field interstitial densities new since the CT of 12/09/2023 most consistent with pneumonia. No pneumothorax. Top-normal cardiac size. No acute osseous pathology. IMPRESSION: 1. Small bilateral pleural effusions and bibasilar atelectasis or infiltrate. 2. Patchy bilateral mid lung field interstitial densities most consistent with pneumonia. 3. Emphysema. Electronically Signed   By: Vanetta Chou M.D.   On: 02/27/2024 12:04     .Critical Care  Performed by: Charlyn Sora, MD Authorized by: Charlyn Sora, MD   Critical care provider statement:    Critical care time (minutes):  48   Critical care was necessary to treat or prevent imminent or life-threatening deterioration of the following conditions:  Respiratory failure, sepsis and CNS failure or compromise   Critical care was time spent personally by me on the  following activities:  Development of treatment plan with patient or surrogate, discussions with consultants, evaluation of patient's response to treatment, examination of patient, ordering and review of laboratory studies, ordering and review of radiographic studies, ordering and performing treatments and interventions, pulse oximetry, re-evaluation of patient's condition, review of old charts and obtaining history from patient or surrogate    Medications Ordered in the ED  azithromycin  (ZITHROMAX ) 500 mg in sodium chloride  0.9 % 250 mL IVPB (500 mg Intravenous New Bag/Given 02/27/24 1159)  sodium chloride  0.9 % bolus 1,000 mL (0 mLs Intravenous Stopped 02/27/24 1214)    And  sodium chloride  0.9 % bolus 250 mL (250 mLs Intravenous New Bag/Given 02/27/24 1241)  fentaNYL  (SUBLIMAZE ) injection 50 mcg (has no administration in time range)  fentaNYL  (SUBLIMAZE ) injection 50-200 mcg (has no administration in time range)  propofol  (DIPRIVAN ) 1000 MG/100ML infusion (10 mcg/kg/min  40 kg Intravenous New Bag/Given 02/27/24 1126)  etomidate  (AMIDATE ) injection (15 mg Intravenous Given 02/27/24 1114)  rocuronium  (ZEMURON ) injection (70 mg Intravenous Given 02/27/24 1114)  methylPREDNISolone  sodium succinate (SOLU-MEDROL ) 125 mg/2 mL injection 125 mg (has no administration in time range)  cefTRIAXone  (ROCEPHIN ) 2 g in sodium chloride  0.9 % 100 mL IVPB (0 g Intravenous Stopped 02/27/24 1229)                                    Medical Decision Making Amount and/or Complexity of Data Reviewed Labs: ordered. Radiology: ordered.  Risk Prescription drug management. Decision regarding hospitalization.   78 year old patient with history of chronic respiratory failure secondary to COPD, breast cancer undergoing radiation therapy and pulmonary hypertension comes in with chief complaint of shortness of breath, unresponsiveness.  Patient arrives to the ER with acute on chronic hypoxic respiratory failure, altered  mental status with obtundation.  We were advised that she is full code by EMS, who brought her from home.  Decision was made to intubate the patient for her respiratory failure and airway protection.  On exam, patient does have some rhonchorous breath sounds.  Aspiration pneumonia possibility.  Differential diagnosis in addition to pneumonia includes  PE, COPD exacerbation, COVID.  Patient intubated.  Family was there after updated. Critical care was consulted.  Patient has received broad-spectrum antibiotics.  Have independently interpreted patient's chest x-ray, ET tube appears appropriate and patient has diffuse opacity.  Concerns for pneumonia.  Patient has received 30 cc/kg of fluid. Sepsis hemodynamic reassessment completed at 1 PM. Stable for admission at this time.  CODE STATUS: No CPR.   Final diagnoses:  Acute respiratory failure with hypoxia [facility])    ED Discharge Orders     None          Charlyn Sora, MD 02/27/24 1313    Charlyn Sora, MD 02/27/24 1314

## 2024-02-27 NOTE — Progress Notes (Signed)
 Pt transported on vent from ED to 3M08 without any complications. RN at bedside.

## 2024-02-27 NOTE — H&P (Signed)
 NAME:  Beth Daniels, MRN:  [id number], DOB:  07/14/1945, LOS: 0 ADMISSION DATE:  02/27/2024, CONSULTATION DATE: 02/27/2024 REFERRING MD:  Charlyn Sora , CHIEF COMPLAINT: Shortness of breath  History of Present Illness:  78 year old female with chronic hypoxic/hypercapnic respiratory failure due to severe COPD on home oxygen 6 L/min, breast cancer  on radiation therapy, who was brought to the emergency department after she was found lethargic and unresponsive at home.  As per patient's family she has not been feeling well for past few days complaining of shortness of breath and cough with greenish phlegm today she was found unresponsive and not been able to wake up, they called EMS.  Upon EMS arrival patient oxygen saturation was in 60s, she was bagged and route to ED, she was intubated in the emergency department and placed on mechanical ventilator  No fever history, sick contact  Pertinent  Medical History   Past Medical History:  Diagnosis Date   Alcohol  abuse    Anemia    Cancer (HCC)    breast right   Chronic hyponatremia    COPD (chronic obstructive pulmonary disease) (HCC)    DCIS (ductal carcinoma in situ)    Dyspnea    Emphysema of lung (HCC)    Lung cancer (HCC)    has received 2 treatments of radiation   Osteoporosis    Overactive bladder    PVD (peripheral vascular disease) (HCC)    Tobacco use      Significant Hospital Events: Including procedures, antibiotic start and stop dates in addition to other pertinent events     Interim History / Subjective:  As above  Objective    Blood pressure 110/60, pulse 94, temperature (!) 95.1 F (35.1 C), temperature source Tympanic, resp. rate (!) 28, height 5' 2 (1.575 m), weight 40 kg, SpO2 94%.    Vent Mode: PRVC FiO2 (%):  [60 %-100 %] 60 % Set Rate:  [18 bmp-28 bmp] 28 bmp Vt Set:  [400 mL] 400 mL PEEP:  [5 cmH20] 5 cmH20 Plateau Pressure:  [24 cmH20] 24 cmH20  No intake or output data in the 24 hours ending  02/27/24 1304 Filed Weights   02/27/24 1100  Weight: 40 kg    Examination: General: Crtitically ill-appearing cachectic female, orally intubated HEENT: [place]/AT, eyes anicteric.  ETT and OGT in place Neuro: Sedated, not following commands.  Eyes are closed.  Pupils 3 mm bilateral reactive to light Chest: Reduced air entry at the bases bilaterally, faint expiratory wheezes noted in bilateral upper lobes Heart: Regular rate and rhythm, no murmurs or gallops Abdomen: Soft, nondistended, bowel sounds present Skin: Chronic changes in bilateral lower extremities, bilateral lower extremities are mottled from foot to mid thigh bilaterally  Labs and images reviewed  Patient Lines/Drains/Airways Status     Active Line/Drains/Airways     Name Placement date Placement time Site Days   Peripheral IV 05/14/22 22 G Left Antecubital 05/14/22  1216  Antecubital  654   Peripheral IV 08/26/22 22 G Anterior;Left Forearm 08/26/22  1239  Forearm  550   Peripheral IV 02/11/23 22 G 1 Left Antecubital 02/11/23  1800  Antecubital  381   Peripheral IV 05/21/23 22 G Left Antecubital 05/21/23  1348  Antecubital  282   Peripheral IV 02/27/24 18 G Anterior;Proximal;Right Forearm 02/27/24  1117  Forearm  less than 1   Peripheral IV 02/27/24 20 G Posterior;Right Forearm 02/27/24  1117  Forearm  less than 1   Peripheral IV 02/27/24  18 G Left Antecubital 02/27/24  1141  Antecubital  less than 1   Airway 7.5 mm 02/27/24  1120  -- less than 1        Resolved problem list   Assessment and Plan  Acute on chronic hypoxic/hypercapnic respiratory failure Acute COPD exacerbation Acute respiratory acidosis Bilateral multifocal pneumonia Chronic hyponatremia Hyperkalemia Anemia of chronic disease Breast cancer on radiation therapy Cachexia/severe protein calorie malnutrition  Continue lung protective ambulation VAP prevention bundle in place PAD protocol with propofol  and fentanyl  Vent setting was adjusted to  clear hypercapnia Continue IV steroid with Solu-Medrol  40 mg twice daily Continue scheduled DuoNebs Continue ceftriaxone  and azithromycin  Follow-up respiratory culture and adjust antibiotic accordingly Closely monitor and supplement electrolytes Monitor H&H Outpatient follow-up with oncology Dietitian consulted, she will be started on tube feeds  Spoke with patient's husband, updated patient's current condition, he decided to keep her DNR while continuing full scope of care.  Once patient gets better he is open to talk about long-term plan including hospice    Labs   CBC: Recent Labs  Lab 02/27/24 1113 02/27/24 1157  WBC 13.6*  --   NEUTROABS 11.6*  --   HGB 7.7* 8.2*  HCT 26.4* 24.0*  MCV 109.1*  --   PLT 193  --     Basic Metabolic Panel: Recent Labs  Lab 02/27/24 1113 02/27/24 1157  NA 133* 131*  K 5.7* 4.5  CL 90*  --   CO2 33*  --   GLUCOSE 94  --   BUN 16  --   CREATININE 0.45  --   CALCIUM  8.0*  --    GFR: Estimated Creatinine Clearance: 37.2 mL/min (by C-G formula based on SCr of 0.45 mg/dL). Recent Labs  Lab 02/27/24 1113 02/27/24 1210  WBC 13.6*  --   LATICACIDVEN  --  2.0*    Liver Function Tests: Recent Labs  Lab 02/27/24 1113  AST 32  ALT 19  ALKPHOS 115  BILITOT 0.6  PROT 5.8*  ALBUMIN  2.6*   No results for input(s): LIPASE, AMYLASE in the last 168 hours. No results for input(s): AMMONIA in the last 168 hours.  ABG    Component Value Date/Time   PHART 7.253 (L) 02/27/2024 1157   PCO2ART 81.3 (HH) 02/27/2024 1157   PO2ART 127 (H) 02/27/2024 1157   HCO3 35.9 (H) 02/27/2024 1157   TCO2 38 (H) 02/27/2024 1157   O2SAT 98 02/27/2024 1157     Coagulation Profile: Recent Labs  Lab 02/27/24 1113  INR 1.0    Cardiac Enzymes: No results for input(s): CKTOTAL, CKMB, CKMBINDEX, TROPONINI in the last 168 hours.  HbA1C: Hgb A1c MFr Bld  Date/Time Value Ref Range Status  10/01/2021 03:42 PM 4.8 4.8 - 5.6 % Final     Comment:             Prediabetes: 5.7 - 6.4          Diabetes: >6.4          Glycemic control for adults with diabetes: <7.0     CBG: Recent Labs  Lab 02/27/24 1114  GLUCAP 93    Review of Systems:   Unable to obtain as patient is intubated and sedated  Past Medical History:  She,  has a past medical history of Alcohol  abuse, Anemia, Cancer (HCC), Chronic hyponatremia, COPD (chronic obstructive pulmonary disease) (HCC), DCIS (ductal carcinoma in situ), Dyspnea, Emphysema of lung (HCC), Lung cancer (HCC), Osteoporosis, Overactive bladder, PVD (peripheral vascular disease) (  HCC), and Tobacco use.   Surgical History:   Past Surgical History:  Procedure Laterality Date   ABDOMINAL AORTOGRAM W/LOWER EXTREMITY N/A 03/15/2019   Procedure: ABDOMINAL AORTOGRAM W/LOWER EXTREMITY;  Surgeon: Serene Gaile ORN, MD;  Location: [facility];  Service: Cardiovascular;  Laterality: N/A;   aeptoplasty     BREAST SURGERY     BRONCHIAL BRUSHINGS  02/12/2021   Procedure: BRONCHIAL BRUSHINGS;  Surgeon: Brenna Adine CROME, DO;  Location: [facility] ENDOSCOPY;  Service: Pulmonary;;   BRONCHIAL NEEDLE ASPIRATION BIOPSY  02/12/2021   Procedure: BRONCHIAL NEEDLE ASPIRATION BIOPSIES;  Surgeon: Brenna Adine CROME, DO;  Location: [facility] ENDOSCOPY;  Service: Pulmonary;;   cartaract extraction     EYE SURGERY Bilateral    Cataract in R & L eye and Retina surgery after cataract surgery in L eye   FIDUCIAL MARKER PLACEMENT  02/12/2021   Procedure: FIDUCIAL MARKER PLACEMENT;  Surgeon: Brenna Adine CROME, DO;  Location: [facility] ENDOS[facility];  Service: Pulmonary;;   INTRAMEDULLARY (IM) NAIL INTERTROCHANTERIC Left 01/30/2019   Procedure: INTRAMEDULLARY (IM) NAIL INTERTROCHANTRIC;  Surgeon: Liam Lerner, MD;  Location: [facility] OR;  Service: Orthopedics;  Laterality: Left;   IR FIBRIN GLUE REPAIR ANAL FISTULA     MASTECTOMY     s/p bl   PERIPHERAL VASCULAR INTERVENTION  03/15/2019   Procedure: PERIPHERAL VASCULAR INTERVENTION;  Surgeon:  Serene Gaile ORN, MD;  Location: [facility];  Service: Cardiovascular;;  Lt. SFA   PERIPHERAL VASCULAR THROMBECTOMY  03/15/2019   Procedure: PERIPHERAL VASCULAR THROMBECTOMY;  Surgeon: Serene Gaile ORN, MD;  Location: [facility];  Service: Cardiovascular;;  Lt. AT, PT   RETINAL DETACHMENT SURGERY     sclerotherapy vein leg     VASCULAR SURGERY     VIDEO BRONCHOSCOPY WITH ENDOBRONCHIAL NAVIGATION Bilateral 02/12/2021   Procedure: VIDEO BRONCHOSCOPY WITH ENDOBRONCHIAL NAVIGATION;  Surgeon: Brenna Adine CROME, DO;  Location: [facility];  Service: Pulmonary;  Laterality: Bilateral;  ION   VIDEO BRONCHOSCOPY WITH RADIAL ENDOBRONCHIAL ULTRASOUND  02/12/2021   Procedure: RADIAL ENDOBRONCHIAL ULTRASOUND;  Surgeon: Brenna Adine CROME, DO;  Location: [facility];  Service: Pulmonary;;   vitreous retinal surgery       Social History:   reports that she quit smoking about 7 years ago. Her smoking use included cigarettes. She started smoking about 55 years ago. She has a 75.1 pack-year smoking history. She has never used smokeless tobacco. She reports current alcohol  use of about 28.0 standard drinks of alcohol  per week. She reports that she does not currently use drugs.   Family History:  Her family history includes Alzheimer's disease in her mother; Pneumonia in her father.   Allergies No Known Allergies   Home Medications  Prior to Admission medications   Medication Sig Start Date End Date Taking? Authorizing Provider  albuterol  (PROVENTIL ) (2.5 MG/3ML) 0.083% nebulizer solution INHALE CONTENTS OF 1 VIAL VIA NEBULIZER EVERY 4 HOURS AS NEEDED FOR WHEEZING OR SHORTNESS OF BREATH 12/26/23  Yes Olalere, Adewale A, MD  albuterol  (VENTOLIN  HFA) 108 (90 Base) MCG/ACT inhaler USE 1 INHALATION EVERY 6 HOURS AS NEEDED FOR WHEEZING OR SHORTNESS OF BREATH 05/22/23  Yes Olalere, Adewale A, MD  aspirin  EC 81 MG tablet Take 81 mg by mouth daily.   Yes [provider]  atorvastatin  (LIPITOR) 40  MG tablet Take 40 mg by mouth daily. 07/17/19  Yes [provider]  azelastine  (ASTELIN ) 0.1 % nasal spray Place 1 spray into both nostrils 2 (two) times daily. 01/14/24  Yes Olalere, Adewale A, MD  Azelastine -Fluticasone  137-50 MCG/ACT SUSP Place 1 spray into the nose daily. 07/17/23  Yes Neda Hammond A, MD  conjugated estrogens (PREMARIN) vaginal cream Place 1 Applicatorful vaginally every Monday, Wednesday, and Friday at 8 PM. 12/20/18  Yes [provider]  Cranberry 400 MG CAPS Take 1,200 mg by mouth daily at 2 PM.   Yes [provider]  denosumab  (PROLIA ) 60 MG/ML SOSY injection Inject 60 mg into the skin every 6 (six) months.    Yes [provider]  docusate sodium  (COLACE) 100 MG capsule Take 1 capsule (100 mg total) by mouth 2 (two) times daily. Patient taking differently: Take 100 mg by mouth at bedtime. 02/01/19  Yes Maree, Pratik D, DO  Ferrous Fumarate (HEMOCYTE - 106 MG FE) 324 (106 Fe) MG TABS tablet Take 1 tablet by mouth daily at 3 pm.  02/09/18  Yes [provider]  fluticasone  (FLONASE ) 50 MCG/ACT nasal spray USE 2 SPRAYS IN EACH NOSTRIL TWICE A DAY 11/26/22  Yes Icard, Bradley L, DO  Fluticasone -Umeclidin-Vilant (TRELEGY ELLIPTA ) 200-62.5-25 MCG/ACT AEPB Inhale 1 puff into the lungs daily. 01/14/24  Yes Olalere, Adewale A, MD  folic acid  (FOLVITE ) 1 MG tablet Take 1 mg by mouth daily.   Yes [provider]  gabapentin  (NEURONTIN ) 400 MG capsule TAKE 2 CAPSULES THREE TIMES A DAY 02/18/24  Yes McCue, Harlene, NP  guaiFENesin  (MUCINEX ) 600 MG 12 hr tablet Take 600 mg by mouth 2 (two) times daily.   Yes [provider]  hydrocortisone 2.5 % cream Apply 1 application topically daily. 01/29/21  Yes [provider]  ipratropium (ATROVENT ) 0.06 % nasal spray Place 2 sprays into both nostrils 4 (four) times daily. 07/17/23  Yes Olalere, Adewale A, MD  Multiple Vitamin (MULTI-VITAMIN) tablet Take 1 tablet by mouth daily.    Yes  [provider]  mupirocin ointment (BACTROBAN) 2 % Apply 1 application topically daily as needed (wound care).  08/28/16  Yes [provider]  MYRBETRIQ 50 MG TB24 tablet Take 50 mg by mouth at bedtime. 07/12/20  Yes [provider]  OXYGEN Inhale 5 L into the lungs continuous.   Yes [provider]  pantoprazole  (PROTONIX ) 40 MG tablet Take 40 mg by mouth daily. 12/01/18  Yes [provider]  sodium chloride  (OCEAN) 0.65 % SOLN nasal spray Place 1 spray into both nostrils 2 (two) times daily. As needed during the day   Yes [provider]  sodium chloride  HYPERTONIC 3 % nebulizer solution Take by nebulization 2 (two) times daily. 4 cc nebulized twice a day 09/21/23  Yes Olalere, Adewale A, MD  Vitamin D , Cholecalciferol, 25 MCG (1000 UT) TABS Take 1,000 Units by mouth daily.   Yes [provider]     Critical care time:      The patient is critically ill due to acute on chronic respiratory failure with hypoxia and hypercapnia/COPD exacerbation critical care was necessary to treat or prevent imminent or life-threatening deterioration.  Critical care was time spent personally by me on the following activities: development of treatment plan with patient and/or surrogate as well as nursing, discussions with consultants, evaluation of patient's response to treatment, examination of patient, obtaining history from patient or surrogate, ordering and performing treatments and interventions, ordering and review of laboratory studies, ordering and review of radiographic studies, pulse oximetry, re-evaluation of patient's condition and participation in multidisciplinary rounds.   During this encounter critical care time was devoted to patient  care services described in this note for 44 minutes.     Valinda Novas, MD [place] [facility] See Amion for pager If no response to pager, please call [phone] until 7pm After 7pm, Please call  E-link [phone]

## 2024-02-27 NOTE — Sepsis Progress Note (Addendum)
 Elink following code sepsis  58 messaged provider asking if he wanted 3rd lactic, he will place order for 3rd and I have notified bedside RN that the order should be coming.

## 2024-02-27 NOTE — ED Triage Notes (Signed)
 Pt BIB [facility] from home d/t respiratory distress. Pt has hx of lung cancer. Per EMS, pt started  feeling sob last night. Progressed overnight and became unresponsive this morning. Upon EMS arrival, pt's sat was in the 60s. Pt was bagged en route w EMS.

## 2024-02-27 NOTE — Plan of Care (Signed)
  Problem: Tissue Perfusion: Goal: Adequacy of tissue perfusion will improve Outcome: Progressing   Problem: Education: Goal: Knowledge of General Education information will improve Description: Including pain rating scale, medication(s)/side effects and non-pharmacologic comfort measures Outcome: Progressing   Problem: Clinical Measurements: Goal: Ability to maintain clinical measurements within normal limits will improve Outcome: Progressing   Problem: Activity: Goal: Risk for activity intolerance will decrease Outcome: Progressing   Problem: Coping: Goal: Level of anxiety will decrease Outcome: Progressing

## 2024-02-28 ENCOUNTER — Inpatient Hospital Stay (HOSPITAL_COMMUNITY)

## 2024-02-28 DIAGNOSIS — J9622 Acute and chronic respiratory failure with hypercapnia: Secondary | ICD-10-CM | POA: Diagnosis not present

## 2024-02-28 DIAGNOSIS — J9621 Acute and chronic respiratory failure with hypoxia: Secondary | ICD-10-CM | POA: Diagnosis not present

## 2024-02-28 DIAGNOSIS — J441 Chronic obstructive pulmonary disease with (acute) exacerbation: Secondary | ICD-10-CM | POA: Diagnosis not present

## 2024-02-28 DIAGNOSIS — J189 Pneumonia, unspecified organism: Secondary | ICD-10-CM | POA: Diagnosis not present

## 2024-02-28 LAB — POCT I-STAT 7, (LYTES, BLD GAS, ICA,H+H)
Acid-Base Excess: 6 mmol/L — ABNORMAL HIGH (ref 0.0–2.0)
Acid-Base Excess: 9 mmol/L — ABNORMAL HIGH (ref 0.0–2.0)
Bicarbonate: 33 mmol/L — ABNORMAL HIGH (ref 20.0–28.0)
Bicarbonate: 34.3 mmol/L — ABNORMAL HIGH (ref 20.0–28.0)
Calcium, Ion: 1.08 mmol/L — ABNORMAL LOW (ref 1.15–1.40)
Calcium, Ion: 1.04 mmol/L — ABNORMAL LOW (ref 1.15–1.40)
HCT: 24 % — ABNORMAL LOW (ref 36.0–46.0)
HCT: 27 % — ABNORMAL LOW (ref 36.0–46.0)
Hemoglobin: 8.2 g/dL — ABNORMAL LOW (ref 12.0–15.0)
Hemoglobin: 9.2 g/dL — ABNORMAL LOW (ref 12.0–15.0)
O2 Saturation: 91 %
O2 Saturation: 96 %
Patient temperature: 35.9
Patient temperature: 99
Potassium: 4.6 mmol/L (ref 3.5–5.1)
Potassium: 4 mmol/L (ref 3.5–5.1)
Sodium: 132 mmol/L — ABNORMAL LOW (ref 135–145)
Sodium: 132 mmol/L — ABNORMAL LOW (ref 135–145)
TCO2: 35 mmol/L — ABNORMAL HIGH (ref 22–32)
TCO2: 36 mmol/L — ABNORMAL HIGH (ref 22–32)
pCO2 arterial: 56.8 mmHg — ABNORMAL HIGH (ref 32–48)
pCO2 arterial: 53.3 mmHg — ABNORMAL HIGH (ref 32–48)
pH, Arterial: 7.367 (ref 7.35–7.45)
pH, Arterial: 7.417 (ref 7.35–7.45)
pO2, Arterial: 61 mmHg — ABNORMAL LOW (ref 83–108)
pO2, Arterial: 84 mmHg (ref 83–108)

## 2024-02-28 LAB — BLOOD CULTURE ID PANEL (REFLEXED) - BCID2

## 2024-02-28 LAB — CBC
HCT: 22.8 % — ABNORMAL LOW (ref 36.0–46.0)
Hemoglobin: 6.9 g/dL — CL (ref 12.0–15.0)
MCH: 31.4 pg (ref 26.0–34.0)
MCHC: 30.3 g/dL (ref 30.0–36.0)
MCV: 103.6 fL — ABNORMAL HIGH (ref 80.0–100.0)
Platelets: 257 K/uL (ref 150–400)
RBC: 2.2 MIL/uL — ABNORMAL LOW (ref 3.87–5.11)
RDW: 14.3 % (ref 11.5–15.5)
WBC: 11.6 K/uL — ABNORMAL HIGH (ref 4.0–10.5)
nRBC: 0 % (ref 0.0–0.2)

## 2024-02-28 LAB — BASIC METABOLIC PANEL WITH GFR
Anion gap: 12 (ref 5–15)
BUN: 10 mg/dL (ref 8–23)
CO2: 29 mmol/L (ref 22–32)
Calcium: 7.7 mg/dL — ABNORMAL LOW (ref 8.9–10.3)
Chloride: 94 mmol/L — ABNORMAL LOW (ref 98–111)
Creatinine, Ser: 0.61 mg/dL (ref 0.44–1.00)
GFR, Estimated: >60 mL/min (ref >60)
Glucose, Bld: 117 mg/dL — ABNORMAL HIGH (ref 70–99)
Potassium: 4.1 mmol/L (ref 3.5–5.1)
Sodium: 135 mmol/L (ref 135–145)

## 2024-02-28 LAB — MAGNESIUM
Magnesium: 2.3 mg/dL (ref 1.7–2.4)
Magnesium: 2.4 mg/dL (ref 1.7–2.4)

## 2024-02-28 LAB — LACTIC ACID, PLASMA: Lactic Acid, Venous: 0.7 mmol/L (ref 0.5–1.9)

## 2024-02-28 LAB — HEMOGLOBIN AND HEMATOCRIT, BLOOD
HCT: 21.1 % — ABNORMAL LOW (ref 36.0–46.0)
Hemoglobin: 6.6 g/dL — CL (ref 12.0–15.0)

## 2024-02-28 LAB — PHOSPHORUS
Phosphorus: 2.1 mg/dL — ABNORMAL LOW (ref 2.5–4.6)
Phosphorus: 2.1 mg/dL — ABNORMAL LOW (ref 2.5–4.6)

## 2024-02-28 LAB — PROCALCITONIN: Procalcitonin: 0.43 ng/mL

## 2024-02-28 LAB — GLUCOSE, CAPILLARY
Glucose-Capillary: 112 mg/dL — ABNORMAL HIGH (ref 70–99)
Glucose-Capillary: 129 mg/dL — ABNORMAL HIGH (ref 70–99)
Glucose-Capillary: 156 mg/dL — ABNORMAL HIGH (ref 70–99)
Glucose-Capillary: 172 mg/dL — ABNORMAL HIGH (ref 70–99)
Glucose-Capillary: 95 mg/dL (ref 70–99)

## 2024-02-28 LAB — LEGIONELLA PNEUMOPHILA SEROGP 1 UR AG: L. pneumophila Serogp 1 Ur Ag: NEGATIVE

## 2024-02-28 LAB — TRIGLYCERIDES: Triglycerides: 61 mg/dL (ref <150)

## 2024-02-28 LAB — PREPARE RBC (CROSSMATCH)

## 2024-02-28 MED ORDER — SODIUM CHLORIDE 0.9 % IV SOLN
2.0000 g | INTRAVENOUS | Status: DC
  Administered 2024-02-29: 2 g via INTRAVENOUS
  Filled 2024-02-28: qty 20

## 2024-02-28 MED ORDER — VITAL 1.5 CAL PO LIQD
1000.0000 mL | ORAL | Status: DC
  Administered 2024-02-28 – 2024-03-02 (×4): 1000 mL

## 2024-02-28 MED ORDER — FOLIC ACID 1 MG PO TABS
1.0000 mg | ORAL_TABLET | Freq: Every day | ORAL | Status: DC
  Administered 2024-02-28 – 2024-03-01 (×3): 1 mg
  Filled 2024-02-28 (×3): qty 1

## 2024-02-28 MED ORDER — THIAMINE MONONITRATE 100 MG PO TABS
100.0000 mg | ORAL_TABLET | Freq: Every day | ORAL | Status: DC
  Administered 2024-02-28 – 2024-03-01 (×3): 100 mg
  Filled 2024-02-28 (×3): qty 1

## 2024-02-28 MED ORDER — PROSOURCE TF20 ENFIT COMPATIBL EN LIQD
60.0000 mL | Freq: Every day | ENTERAL | Status: DC
  Administered 2024-02-28 – 2024-03-01 (×3): 60 mL
  Filled 2024-02-28 (×3): qty 60

## 2024-02-28 MED ORDER — CLONAZEPAM 0.5 MG PO TABS
0.5000 mg | ORAL_TABLET | Freq: Two times a day (BID) | ORAL | Status: DC | PRN
  Administered 2024-02-28 – 2024-02-29 (×2): 0.5 mg
  Filled 2024-02-28 (×2): qty 1

## 2024-02-28 MED ORDER — SODIUM PHOSPHATES 45 MMOLE/15ML IV SOLN
30.0000 mmol | Freq: Once | INTRAVENOUS | Status: AC
  Administered 2024-02-28: 30 mmol via INTRAVENOUS
  Filled 2024-02-28: qty 10

## 2024-02-28 MED ORDER — SODIUM CHLORIDE 0.9 % IV SOLN
1.0000 g | Freq: Once | INTRAVENOUS | Status: AC
  Administered 2024-02-28: 1 g via INTRAVENOUS
  Filled 2024-02-28: qty 10

## 2024-02-28 MED ORDER — ADULT MULTIVITAMIN W/MINERALS CH
1.0000 | ORAL_TABLET | Freq: Every day | ORAL | Status: DC
  Administered 2024-02-28 – 2024-03-01 (×3): 1
  Filled 2024-02-28 (×4): qty 1

## 2024-02-28 MED ORDER — SODIUM CHLORIDE 0.9% IV SOLUTION: Freq: Once | INTRAVENOUS | Status: AC

## 2024-02-28 NOTE — Progress Notes (Signed)
 eLink Physician-Brief Progress Note Patient Name: PRESLI FANGUY DOB: July 16, 1945 MRN: [id number]   Date of Service  02/28/2024  HPI/Events of Note  78 year old female here with acute on chronic hypoxic/hypercapnic respiratory failure in the setting of acute COPD exacerbation and bilateral multifocal pneumonia   eICU Interventions  Renew restraints for patient safety     Intervention Category Minor Interventions: Agitation / anxiety - evaluation and management  Shaun Zuccaro 02/28/2024, 7:44 PM

## 2024-02-28 NOTE — Progress Notes (Signed)
 PHARMACY - PHYSICIAN COMMUNICATION CRITICAL VALUE ALERT - BLOOD CULTURE IDENTIFICATION (BCID)  Beth Daniels is an 78 y.o. female who presented to [facility] on 02/27/2024 with a chief complaint of lethragic/unresponsive   Assessment:  1 out of 4 Blood cultures with GPC. BCID - Staph species > possible contamination. Afebrile. LA & WBC trending down.   Name of physician (or Provider) Contacted: Dr. Harold  Current antibiotics: Ceftiraxone and Azithromycin  for CAP  Changes to prescribed antibiotics recommended:  Patient is on recommended antibiotics - No changes needed  Results for orders placed or performed during the hospital encounter of 02/27/24  Blood Culture ID Panel (Reflexed) (Collected: 02/27/2024 11:45 AM)  Result Value Ref Range   Enterococcus faecalis NOT DETECTED NOT DETECTED   Enterococcus Faecium NOT DETECTED NOT DETECTED   Listeria monocytogenes NOT DETECTED NOT DETECTED   Staphylococcus species DETECTED (A) NOT DETECTED   Staphylococcus aureus (BCID) NOT DETECTED NOT DETECTED   Staphylococcus epidermidis NOT DETECTED NOT DETECTED   Staphylococcus lugdunensis NOT DETECTED NOT DETECTED   Streptococcus species NOT DETECTED NOT DETECTED   Streptococcus agalactiae NOT DETECTED NOT DETECTED   Streptococcus pneumoniae NOT DETECTED NOT DETECTED   Streptococcus pyogenes NOT DETECTED NOT DETECTED   A.calcoaceticus-baumannii NOT DETECTED NOT DETECTED   Bacteroides fragilis NOT DETECTED NOT DETECTED   Enterobacterales NOT DETECTED NOT DETECTED   Enterobacter cloacae complex NOT DETECTED NOT DETECTED   Escherichia coli NOT DETECTED NOT DETECTED   Klebsiella aerogenes NOT DETECTED NOT DETECTED   Klebsiella oxytoca NOT DETECTED NOT DETECTED   Klebsiella pneumoniae NOT DETECTED NOT DETECTED   Proteus species NOT DETECTED NOT DETECTED   Salmonella species NOT DETECTED NOT DETECTED   Serratia marcescens NOT DETECTED NOT DETECTED   Haemophilus influenzae NOT DETECTED NOT DETECTED    Neisseria meningitidis NOT DETECTED NOT DETECTED   Pseudomonas aeruginosa NOT DETECTED NOT DETECTED   Stenotrophomonas maltophilia NOT DETECTED NOT DETECTED   Candida albicans NOT DETECTED NOT DETECTED   Candida auris NOT DETECTED NOT DETECTED   Candida glabrata NOT DETECTED NOT DETECTED   Candida krusei NOT DETECTED NOT DETECTED   Candida parapsilosis NOT DETECTED NOT DETECTED   Candida tropicalis NOT DETECTED NOT DETECTED   Cryptococcus neoformans/gattii NOT DETECTED NOT DETECTED    Harlene Boga, PharmD, BCPS, BCCCP Clinical Pharmacist Please refer to [facility] for [facility] numbers 02/28/2024  2:25 PM

## 2024-02-28 NOTE — Progress Notes (Signed)
 NAME:  Beth Daniels, MRN:  [id number], DOB:  Apr 23, 1946, LOS: 1 ADMISSION DATE:  02/27/2024, CONSULTATION DATE: 02/27/2024 REFERRING MD:  Charlyn Sora , CHIEF COMPLAINT: Shortness of breath  History of Present Illness:  78 year old female with chronic hypoxic/hypercapnic respiratory failure due to severe COPD on home oxygen 6 L/min, breast cancer  on radiation therapy, who was brought to the emergency department after she was found lethargic and unresponsive at home.  As per patient's family she has not been feeling well for past few days complaining of shortness of breath and cough with greenish phlegm today she was found unresponsive and not been able to wake up, they called EMS.  Upon EMS arrival patient oxygen saturation was in 60s, she was bagged and route to ED, she was intubated in the emergency department and placed on mechanical ventilator  No fever history, sick contact  Pertinent  Medical History   Past Medical History:  Diagnosis Date   Alcohol  abuse    Anemia    Cancer (HCC)    breast right   Chronic hyponatremia    COPD (chronic obstructive pulmonary disease) (HCC)    DCIS (ductal carcinoma in situ)    Dyspnea    Emphysema of lung (HCC)    Lung cancer (HCC)    has received 2 treatments of radiation   Osteoporosis    Overactive bladder    PVD (peripheral vascular disease) (HCC)    Tobacco use      Significant Hospital Events: Including procedures, antibiotic start and stop dates in addition to other pertinent events   8/23 intubated in ED hypoxic hypercarbic failure, AECOPD   Interim History / Subjective:  Hgb don to 6.9 no overt bleeding Elink ordered blood but was pending consent + T/S  Objective    Blood pressure (!) 104/46, pulse 93, temperature 98.2 F (36.8 C), resp. rate (!) 22, height 5' 2 (1.575 m), weight 42.7 kg, SpO2 (!) 89%.    Vent Mode: PRVC FiO2 (%):  [50 %-60 %] 50 % Set Rate:  [28 bmp] 28 bmp Vt Set:  [400 mL] 400 mL PEEP:  [5 cmH20] 5  cmH20 Plateau Pressure:  [16 cmH20-21 cmH20] 17 cmH20   Intake/Output Summary (Last 24 hours) at 02/28/2024 1126 Last data filed at 02/28/2024 1100 Gross per 24 hour  Intake 2770.8 ml  Output 985 ml  Net 1785.8 ml   Filed Weights   02/27/24 1100 02/27/24 1600 02/28/24 0700  Weight: 40 kg 42.7 kg 42.7 kg    Examination: General: Critically and chronically ill frail elderly F intubated lightly sedated NAD HEENT: ETT secure  Neuro: Lightly sedated, awakens and moves extremities, intermittently interactive  Chest: Symmetrical chest expansion, clear  Heart: rrr  Abdomen: thin soft ndnt  GU: foley    Resolved problem list   Assessment and Plan   AECOPD AoC hypoxic/hypercarbic resp failure Recent URI Suspected CAP  Hypotension, improving -- ? Septic shock 2/2 CAP sv sedation related hypotension  Breast cancer Anemia Severe kcal malnutrition Hypophosphatemia EtOH use disorder  PAD on DAPT DNR status  P -cont CAP coverage -cont MV support- weaning settings, spo2 goal > 88  -cont IV steroids (40 solumedrol daily 2/2 her weight) -to get 1 PRBC  -holding DAPT  -EN -micronutrient support  -wean periph NE as able  -husband/sister updated extensively at bedside    Labs   CBC: Recent Labs  Lab 02/27/24 1113 02/27/24 1157 02/27/24 1526 02/28/24 0242 02/28/24 0503  WBC 13.6*  --   --   --  11.6*  NEUTROABS 11.6*  --   --   --   --   HGB 7.7* 8.2* 8.2* 9.2* 6.9*  HCT 26.4* 24.0* 24.0* 27.0* 22.8*  MCV 109.1*  --   --   --  103.6*  PLT 193  --   --   --  257    Basic Metabolic Panel: Recent Labs  Lab 02/27/24 1113 02/27/24 1157 02/27/24 1438 02/27/24 1526 02/28/24 0242 02/28/24 0503  NA 133* 131* 134* 132* 132* 135  K 5.7* 4.5 5.0 4.6 4.0 4.1  CL 90*  --  93*  --   --  94*  CO2 33*  --  28  --   --  29  GLUCOSE 94  --  90  --   --  117*  BUN 16  --  15  --   --  10  CREATININE 0.45  --  0.47  --   --  0.61  CALCIUM  8.0*  --  7.9*  --   --  7.7*  MG  --    --  1.7  --   --  2.3  PHOS  --   --   --   --   --  2.1*   GFR: Estimated Creatinine Clearance: 39.7 mL/min (by C-G formula based on SCr of 0.61 mg/dL). Recent Labs  Lab 02/27/24 1113 02/27/24 1210 02/27/24 1438 02/27/24 1955 02/28/24 0503  PROCALCITON  --   --   --   --  0.43  WBC 13.6*  --   --   --  11.6*  LATICACIDVEN  --  2.0* 2.5* 1.1 0.7    Liver Function Tests: Recent Labs  Lab 02/27/24 1113  AST 32  ALT 19  ALKPHOS 115  BILITOT 0.6  PROT 5.8*  ALBUMIN  2.6*   No results for input(s): LIPASE, AMYLASE in the last 168 hours. No results for input(s): AMMONIA in the last 168 hours.  ABG    Component Value Date/Time   PHART 7.417 02/28/2024 0242   PCO2ART 53.3 (H) 02/28/2024 0242   PO2ART 84 02/28/2024 0242   HCO3 34.3 (H) 02/28/2024 0242   TCO2 36 (H) 02/28/2024 0242   O2SAT 96 02/28/2024 0242     Coagulation Profile: Recent Labs  Lab 02/27/24 1113  INR 1.0    Cardiac Enzymes: No results for input(s): CKTOTAL, CKMB, CKMBINDEX, TROPONINI in the last 168 hours.  HbA1C: Hgb A1c MFr Bld  Date/Time Value Ref Range Status  02/27/2024 02:38 PM 4.0 (L) 4.8 - 5.6 % Final    Comment:    (NOTE) Diagnosis of Diabetes The following HbA1c ranges recommended by the American Diabetes Association (ADA) may be used as an aid in the diagnosis of diabetes mellitus.  Hemoglobin             Suggested A1C NGSP%              Diagnosis  <5.7                   Non Diabetic  5.7-6.4                Pre-Diabetic  >6.4                   Diabetic  <7.0                   Glycemic control for  adults with diabetes.    10/01/2021 03:42 PM 4.8 4.8 - 5.6 % Final    Comment:             Prediabetes: 5.7 - 6.4          Diabetes: >6.4          Glycemic control for adults with diabetes: <7.0     CBG: Recent Labs  Lab 02/27/24 1535 02/27/24 1959 02/27/24 2331 02/28/24 0402 02/28/24 0758  GLUCAP 83 110* 127* 112* 129*      CRITICAL CARE Performed by: Ronnald FORBES Gave   Total critical care time: 59 minutes  Critical care time was exclusive of separately billable procedures and treating other patients. Critical care was necessary to treat or prevent imminent or life-threatening deterioration.  Critical care was time spent personally by me on the following activities: development of treatment plan with patient and/or surrogate as well as nursing, discussions with consultants, evaluation of patient's response to treatment, examination of patient, obtaining history from patient or surrogate, ordering and performing treatments and interventions, ordering and review of laboratory studies, ordering and review of radiographic studies, pulse oximetry and re-evaluation of patient's condition.  Ronnald Gave MSN, AGACNP-BC  Pulmonary/Critical Care Medicine Amion for pager  02/28/2024, 11:26 AM

## 2024-02-28 NOTE — Progress Notes (Addendum)
 Initial Nutrition Assessment  DOCUMENTATION CODES:  Underweight  INTERVENTION:  Tube feeds via OG-tube once placement confirmed: Start Vital 1.5 at 20 mL/hr, advance by 10 mL every 8 hours to goal rate of 45 mL/hr (1080 mL per day) 60 mL ProSource TF20 - Daily Regimen at goal provides 1700 calories, 93 gm protein, and 825 mL free water daily.  Monitor magnesium , potassium, and phosphorus daily for at least 3 days, MD to replete as needed. Continue Folic acid , Thiamine , and Multivitamin w/ minerals daily  NUTRITION DIAGNOSIS:  Inadequate oral intake related to inability to eat as evidenced by NPO status.  GOAL:  Patient will meet greater than or equal to 90% of their needs  MONITOR:  Labs, Vent status, TF tolerance, I & O's  REASON FOR ASSESSMENT:  Consult Enteral/tube feeding initiation and management  ASSESSMENT:  78 y.o. female presented to the ED after being found at home lethargic and unresponsive. PMH includes COPD, breast cancer s/p radiation, and PVD. Pt admitted with acute on chronic hypoxic/hypercapnic respiratory failure, COPD exacerbation, and bilateral PNA.   8/23 - Admitted; Intubated 8/24 - TF initiated  RD working remotely at time of assessment. RD unable to obtain nutrition history. Patient is currently intubated on ventilator support. No x-ray confirming placement of OG-tube, reached out to CCM NP to obtain x-ray to confirm placement.   Per chart review, pt weight appears relatively stable without significant weight loss over the past year.   MV: 8.6 L/min MAP (cuff): 66 Temp (24hrs), Avg:98.5 F (36.9 C), Min:95.7 F (35.4 C), Max:99.5 F (37.5 C)  Nutrition Related Medications: Pepcid , Folic acid , Novolog  0-15 units q4h, Solu-medrol , MVI, Thiamine , Sodium Phosphate  (once) Drips IV antibiotics Levophed  Propofol  8.4 mL/hr (222 kcal per day) Labs: Sodium 135, Potassium 4.1, Phosphorus 2.1, Magnesium  2.3, Hgb A1c 4.0 CBG: 83-129 mg/dL x 24 hrs    UOP:  164 mL x 24 hrs  NUTRITION - FOCUSED PHYSICAL EXAM: Deferred to follow-up.   Diet Order:   Diet Order             Diet NPO time specified  Diet effective now                  EDUCATION NEEDS:  Not appropriate for education at this time  Skin:  Skin Assessment: Reviewed RN Assessment  Last BM:  Unknown/PTA  Height:  Ht Readings from Last 1 Encounters:  02/27/24 5' 2 (1.575 m)   Weight:  Wt Readings from Last 1 Encounters:  02/28/24 42.7 kg   Ideal Body Weight:  50 kg  BMI:  Body mass index is 17.22 kg/m.  Estimated Nutritional Needs:  Kcal:  1700-1900 Protein:  85-105 grams Fluid:  >/= 1.7 L   Nestora Glatter RD, LDN Clinical Dietitian

## 2024-02-29 ENCOUNTER — Telehealth: Payer: Self-pay | Admitting: Adult Health

## 2024-02-29 DIAGNOSIS — J9621 Acute and chronic respiratory failure with hypoxia: Secondary | ICD-10-CM | POA: Diagnosis not present

## 2024-02-29 DIAGNOSIS — J189 Pneumonia, unspecified organism: Secondary | ICD-10-CM | POA: Diagnosis not present

## 2024-02-29 DIAGNOSIS — J9622 Acute and chronic respiratory failure with hypercapnia: Secondary | ICD-10-CM | POA: Diagnosis not present

## 2024-02-29 DIAGNOSIS — I959 Hypotension, unspecified: Secondary | ICD-10-CM

## 2024-02-29 DIAGNOSIS — J441 Chronic obstructive pulmonary disease with (acute) exacerbation: Secondary | ICD-10-CM | POA: Diagnosis not present

## 2024-02-29 LAB — TYPE AND SCREEN
ABO/RH(D): O POS
Antibody Screen: NEGATIVE
Unit division: 0

## 2024-02-29 LAB — BASIC METABOLIC PANEL WITH GFR
Anion gap: 7 (ref 5–15)
BUN: 10 mg/dL (ref 8–23)
CO2: 32 mmol/L (ref 22–32)
Calcium: 7.1 mg/dL — ABNORMAL LOW (ref 8.9–10.3)
Chloride: 96 mmol/L — ABNORMAL LOW (ref 98–111)
Creatinine, Ser: <0.3 mg/dL — ABNORMAL LOW (ref 0.44–1.00)
Glucose, Bld: 133 mg/dL — ABNORMAL HIGH (ref 70–99)
Potassium: 3.7 mmol/L (ref 3.5–5.1)
Sodium: 135 mmol/L (ref 135–145)

## 2024-02-29 LAB — GLUCOSE, CAPILLARY
Glucose-Capillary: 114 mg/dL — ABNORMAL HIGH (ref 70–99)
Glucose-Capillary: 132 mg/dL — ABNORMAL HIGH (ref 70–99)
Glucose-Capillary: 135 mg/dL — ABNORMAL HIGH (ref 70–99)
Glucose-Capillary: 150 mg/dL — ABNORMAL HIGH (ref 70–99)
Glucose-Capillary: 179 mg/dL — ABNORMAL HIGH (ref 70–99)
Glucose-Capillary: 185 mg/dL — ABNORMAL HIGH (ref 70–99)

## 2024-02-29 LAB — CBC
HCT: 26.1 % — ABNORMAL LOW (ref 36.0–46.0)
Hemoglobin: 8.4 g/dL — ABNORMAL LOW (ref 12.0–15.0)
MCH: 31.3 pg (ref 26.0–34.0)
MCHC: 32.2 g/dL (ref 30.0–36.0)
MCV: 97.4 fL (ref 80.0–100.0)
Platelets: 204 K/uL (ref 150–400)
RBC: 2.68 MIL/uL — ABNORMAL LOW (ref 3.87–5.11)
RDW: 16.5 % — ABNORMAL HIGH (ref 11.5–15.5)
WBC: 11.4 K/uL — ABNORMAL HIGH (ref 4.0–10.5)
nRBC: 0 % (ref 0.0–0.2)

## 2024-02-29 LAB — CULTURE, BLOOD (ROUTINE X 2): Special Requests: ADEQUATE

## 2024-02-29 LAB — MAGNESIUM: Magnesium: 2.1 mg/dL (ref 1.7–2.4)

## 2024-02-29 LAB — BPAM RBC
Blood Product Expiration Date: 202509172359
ISSUE DATE / TIME: 202508241205
Unit Type and Rh: 5100

## 2024-02-29 LAB — PHOSPHORUS: Phosphorus: 2.9 mg/dL (ref 2.5–4.6)

## 2024-02-29 LAB — POTASSIUM: Potassium: 3.5 mmol/L (ref 3.5–5.1)

## 2024-02-29 MED ORDER — MAGNESIUM SULFATE 2 GM/50ML IV SOLN
2.0000 g | Freq: Once | INTRAVENOUS | Status: AC
  Administered 2024-02-29: 2 g via INTRAVENOUS
  Filled 2024-02-29: qty 50

## 2024-02-29 MED ORDER — ARFORMOTEROL TARTRATE 15 MCG/2ML IN NEBU
15.0000 ug | INHALATION_SOLUTION | Freq: Two times a day (BID) | RESPIRATORY_TRACT | Status: DC
  Administered 2024-02-29 – 2024-03-02 (×5): 15 ug via RESPIRATORY_TRACT
  Filled 2024-02-29 (×5): qty 2

## 2024-02-29 MED ORDER — FUROSEMIDE 10 MG/ML IJ SOLN
40.0000 mg | Freq: Once | INTRAMUSCULAR | Status: AC
  Administered 2024-02-29: 40 mg via INTRAVENOUS

## 2024-02-29 MED ORDER — AMIODARONE IV BOLUS ONLY 150 MG/100ML
INTRAVENOUS | Status: AC
  Filled 2024-02-29: qty 100

## 2024-02-29 MED ORDER — BUDESONIDE 0.5 MG/2ML IN SUSP
0.5000 mg | Freq: Two times a day (BID) | RESPIRATORY_TRACT | Status: DC
  Administered 2024-02-29 – 2024-03-02 (×4): 0.5 mg via RESPIRATORY_TRACT
  Filled 2024-02-29 (×5): qty 2

## 2024-02-29 MED ORDER — DEXMEDETOMIDINE HCL IN NACL 400 MCG/100ML IV SOLN
0.0000 ug/kg/h | INTRAVENOUS | Status: DC
  Administered 2024-02-29: 0.9 ug/kg/h via INTRAVENOUS
  Administered 2024-02-29: 0.4 ug/kg/h via INTRAVENOUS
  Administered 2024-03-01 (×2): 1 ug/kg/h via INTRAVENOUS
  Administered 2024-03-02 (×2): 1.2 ug/kg/h via INTRAVENOUS
  Filled 2024-02-29 (×4): qty 100
  Filled 2024-02-29: qty 200

## 2024-02-29 MED ORDER — IPRATROPIUM-ALBUTEROL 0.5-2.5 (3) MG/3ML IN SOLN: 3.0000 mL | RESPIRATORY_TRACT | Status: DC | PRN

## 2024-02-29 MED ORDER — ASPIRIN 81 MG PO CHEW
81.0000 mg | CHEWABLE_TABLET | Freq: Every day | ORAL | Status: DC
  Administered 2024-02-29 – 2024-03-01 (×2): 81 mg
  Filled 2024-02-29 (×2): qty 1

## 2024-02-29 MED ORDER — CALCIUM GLUCONATE-NACL 2-0.675 GM/100ML-% IV SOLN
2.0000 g | Freq: Once | INTRAVENOUS | Status: AC
  Administered 2024-02-29: 2000 mg via INTRAVENOUS
  Filled 2024-02-29: qty 100

## 2024-02-29 MED ORDER — AMIODARONE IV BOLUS ONLY 150 MG/100ML
150.0000 mg | Freq: Once | INTRAVENOUS | Status: AC
  Administered 2024-02-29: 150 mg via INTRAVENOUS

## 2024-02-29 MED ORDER — ATORVASTATIN CALCIUM 40 MG PO TABS
40.0000 mg | ORAL_TABLET | Freq: Every day | ORAL | Status: DC
  Administered 2024-02-29 – 2024-03-01 (×2): 40 mg
  Filled 2024-02-29 (×2): qty 1

## 2024-02-29 MED ORDER — REVEFENACIN 175 MCG/3ML IN SOLN
175.0000 ug | Freq: Every day | RESPIRATORY_TRACT | Status: DC
  Administered 2024-02-29 – 2024-03-02 (×3): 175 ug via RESPIRATORY_TRACT
  Filled 2024-02-29 (×3): qty 3

## 2024-02-29 NOTE — TOC CM/SW Note (Signed)
 Transition of Care [facility]) - Inpatient Brief Assessment   Patient Details  Name: Beth Daniels MRN: [id number] Date of Birth: 1946-04-12  [facility] of [facility] CM/SW Contact:    Tom-Johnson, Harvest Muskrat, RN Phone Number: 02/29/2024, 1:49 PM   Clinical Narrative:  Patient presented to the ED after being found unresponsive at home. Admitted with Acute on Chronic Respiratory Failure, patient is currently intubated and sedated.   Patient not Medically ready for discharge.  CM will continue to follow as patient progresses with care towards discharge.          Transition of Care Asessment:

## 2024-02-29 NOTE — Progress Notes (Signed)
 Nutrition Follow-up  DOCUMENTATION CODES:  Severe malnutrition in context of chronic illness  INTERVENTION:  Continue TF via OGT Vital 1.5 at  45 mL/hr (1080 mL per day) Regimen at goal provides 1620 calories, 73 gm protein, and 825 mL free water daily.   Continue Folic acid , Thiamine , and Multivitamin w/ minerals daily  NUTRITION DIAGNOSIS:  Severe Malnutrition related to chronic illness (COPD) as evidenced by severe fat depletion, severe muscle depletion. - diagnosis updated 8/26  GOAL:  Patient will meet greater than or equal to 90% of their needs - goal met via TF  MONITOR:  Labs, Vent status, TF tolerance, I & O's  REASON FOR ASSESSMENT:  Consult Assessment of nutrition requirement/status  ASSESSMENT:  78 y.o. female presented to the ED after being found at home lethargic and unresponsive. PMH includes COPD, breast cancer s/p radiation, and PVD. Pt admitted with acute on chronic hypoxic/hypercapnic respiratory failure, COPD exacerbation, and bilateral PNA.  8/23 - Admitted; Intubated 8/24 - TF initiated   Patient remains intubated on ventilator support MV: 6.4 L/min Temp (24hrs), Avg:97.7 F (36.5 C), Min:96.3 F (35.7 C), Max:98.6 F (37 C)  Per discussion in rounds, pt awakens and follows commands. Not successful with vent wean attempts d/t apnea.  Family and physicians continue with goals of care discussions. Considering one way extubation.   Spoke with pt's sister and husband at bedside. Pt was living with her husband PTA. They have noticed she has rapidly declined over the last 2 months secondary to her COPD and breathing difficulties. She has been eating very little.  Her weight has declined significantly within this time frame from about 86 lbs down to 74 lbs. Previously her weight had been holding stead around 96-98 lbs for several months.   Continues on tube feeding at goal via OGT.   Drains/lines: OGT (tip is gastric)  Admit weight: 42.7 kg Current  weight: 33.9 kg  Medications: folvite , SSI 0-15 units q4h, solu-medrol , MVI, thiamine , IV abx  Labs:  CBG's 72-185 x24 hours  NUTRITION - FOCUSED PHYSICAL EXAM:  Flowsheet Row Most Recent Value  Orbital Region Severe depletion  Upper Arm Region Severe depletion  Thoracic and Lumbar Region Severe depletion  Buccal Region Unable to assess  Temple Region Unable to assess  Clavicle Bone Region Severe depletion  Clavicle and Acromion Bone Region Severe depletion  Scapular Bone Region Severe depletion  Dorsal Hand Unable to assess  Patellar Region Severe depletion  Anterior Thigh Region Severe depletion  Posterior Calf Region Severe depletion  Edema (RD Assessment) None  Hair Reviewed  Eyes Unable to assess  Mouth Unable to assess  Skin Reviewed  Nails Unable to assess  [handmits]    Diet Order:   Diet Order             Diet NPO time specified  Diet effective now                   EDUCATION NEEDS:   Not appropriate for education at this time  Skin:  Skin Assessment: Reviewed RN Assessment  Last BM:  8/26 type 7 x2 (small, medium)  Height:   Ht Readings from Last 1 Encounters:  02/27/24 5' 2 (1.575 m)    Weight:   Wt Readings from Last 1 Encounters:  03/01/24 33.9 kg    Ideal Body Weight:  50 kg  BMI:  Body mass index is 13.67 kg/m.  Estimated Nutritional Needs:   Kcal:  1400-1600  Protein:  70-80g  Fluid:  >/=1.5L  Royce Maris, RDN, LDN Clinical Nutrition See AMiON for contact information.

## 2024-02-29 NOTE — Telephone Encounter (Signed)
 Spouse reports pt is in the hospital, requested appointment be cx

## 2024-02-29 NOTE — Plan of Care (Signed)
  Problem: Education: Goal: Ability to describe self-care measures that may prevent or decrease complications (Diabetes Survival Skills Education) will improve Outcome: Not Progressing Goal: Individualized Educational Video(s) Outcome: Not Progressing   Problem: Coping: Goal: Ability to adjust to condition or change in health will improve Outcome: Not Progressing   Problem: Fluid Volume: Goal: Ability to maintain a balanced intake and output will improve Outcome: Not Progressing   Problem: Health Behavior/Discharge Planning: Goal: Ability to identify and utilize available resources and services will improve Outcome: Not Progressing Goal: Ability to manage health-related needs will improve Outcome: Not Progressing   Problem: Metabolic: Goal: Ability to maintain appropriate glucose levels will improve Outcome: Not Progressing   Problem: Nutritional: Goal: Maintenance of adequate nutrition will improve Outcome: Not Progressing Goal: Progress toward achieving an optimal weight will improve Outcome: Not Progressing   Problem: Skin Integrity: Goal: Risk for impaired skin integrity will decrease Outcome: Not Progressing   Problem: Tissue Perfusion: Goal: Adequacy of tissue perfusion will improve Outcome: Not Progressing   Problem: Education: Goal: Knowledge of General Education information will improve Description: Including pain rating scale, medication(s)/side effects and non-pharmacologic comfort measures Outcome: Not Progressing   Problem: Health Behavior/Discharge Planning: Goal: Ability to manage health-related needs will improve Outcome: Not Progressing   Problem: Clinical Measurements: Goal: Ability to maintain clinical measurements within normal limits will improve Outcome: Not Progressing Goal: Will remain free from infection Outcome: Not Progressing Goal: Diagnostic test results will improve Outcome: Not Progressing Goal: Respiratory complications will  improve Outcome: Not Progressing Goal: Cardiovascular complication will be avoided Outcome: Not Progressing   Problem: Activity: Goal: Risk for activity intolerance will decrease Outcome: Not Progressing   Problem: Nutrition: Goal: Adequate nutrition will be maintained Outcome: Not Progressing   Problem: Coping: Goal: Level of anxiety will decrease Outcome: Not Progressing   Problem: Elimination: Goal: Will not experience complications related to bowel motility Outcome: Not Progressing Goal: Will not experience complications related to urinary retention Outcome: Not Progressing   Problem: Pain Managment: Goal: General experience of comfort will improve and/or be controlled Outcome: Not Progressing   Problem: Safety: Goal: Ability to remain free from injury will improve Outcome: Not Progressing   Problem: Skin Integrity: Goal: Risk for impaired skin integrity will decrease Outcome: Not Progressing   Problem: Safety: Goal: Non-violent Restraint(s) Outcome: Not Progressing

## 2024-02-29 NOTE — Progress Notes (Signed)
   02/29/24 1549  Spiritual Encounters  Type of Visit Initial  Care provided to: Family  Referral source Patient request  Reason for visit Urgent spiritual support  OnCall Visit No  Spiritual Framework  Presenting Themes Impactful experiences and emotions  Patient Stress Factors Health changes  Family Stress Factors Major life changes;Health changes  Interventions  Spiritual Care Interventions Made Compassionate presence;Established relationship of care and support  Intervention Outcomes  Outcomes Awareness of support;Awareness of health;Reduced anxiety   Chaplain was paged to visit Pt who is currently transitioning. At the family's request, chaplain offered a word of prayer and provided emotional and spiritual support. Chaplain services remain available as needed.

## 2024-02-29 NOTE — Progress Notes (Signed)
 PCCM Interval Note  Patient developed Afib RVR around 1700 with rates in the 120-140's, SBP 160's. Gave 2 mg and 150 mg amiodarone  with conversion to sinus tachycardia. Would start amiodarone  drip if further Afib RVR. Checked potassium which is pending- replete as needed.  Beth LOISE Blush, PA-C [facility] 02/29/24 5:43 PM  Please see [email or web address] for pager details.  From 7A-7P if no response, please call [phone] After hours, please call ELink [phone]

## 2024-02-29 NOTE — Progress Notes (Signed)
   02/29/24 1300  Spiritual Encounters  Type of Visit Initial  Care provided to: Pt and family  Referral source Chaplain team  Reason for visit Routine spiritual support  OnCall Visit No  Interventions  Spiritual Care Interventions Made Established relationship of care and support;Meaning making;Reflective listening;Compassionate presence;Encouragement  Intervention Outcomes  Outcomes Awareness of support;Connected to spiritual community;Connection to spiritual care   Chaplain team passed on report of the desire for a visit from their [place], as patient is currently going through health changes.  Chaplain called [facility] [phone] and was able to request a visit from [facility], [facility], [facility] for the patient.  Spouses phone number provided for contact as needed going forward.   Chaplain spiritual services remain available as the need arises.

## 2024-02-29 NOTE — Progress Notes (Addendum)
 NAME:  Beth Daniels, MRN:  [id number], DOB:  03/21/1946, LOS: 2 ADMISSION DATE:  02/27/2024, CONSULTATION DATE: 02/27/2024 REFERRING MD:  Charlyn Sora , CHIEF COMPLAINT: Shortness of breath  History of Present Illness:  77 year old female with chronic hypoxic/hypercapnic respiratory failure due to severe COPD on home oxygen 6 L/min, breast cancer  on radiation therapy, who was brought to the emergency department after she was found lethargic and unresponsive at home.  As per patient's family she has not been feeling well for past few days complaining of shortness of breath and cough with greenish phlegm today she was found unresponsive and not been able to wake up, they called EMS.  Upon EMS arrival patient oxygen saturation was in 60s, she was bagged and route to ED, she was intubated in the emergency department and placed on mechanical ventilator  No fever history, sick contact  Pertinent  Medical History   Past Medical History:  Diagnosis Date   Alcohol  abuse    Anemia    Cancer (HCC)    breast right   Chronic hyponatremia    COPD (chronic obstructive pulmonary disease) (HCC)    DCIS (ductal carcinoma in situ)    Dyspnea    Emphysema of lung (HCC)    Lung cancer (HCC)    has received 2 treatments of radiation   Osteoporosis    Overactive bladder    PVD (peripheral vascular disease) (HCC)    Tobacco use      Significant Hospital Events: Including procedures, antibiotic start and stop dates in addition to other pertinent events   8/23 intubated in ED hypoxic hypercarbic failure, AECOPD   Interim History / Subjective:  On 50 mcg prop Following commands On PRVC fio2 50% sats 88-90%  Objective    Blood pressure (!) 115/54, pulse 96, temperature (!) 97.2 F (36.2 C), resp. rate (!) 24, height 5' 2 (1.575 m), weight 42.7 kg, SpO2 94%.    Vent Mode: PRVC FiO2 (%):  [40 %-50 %] 50 % Set Rate:  [22 bmp-28 bmp] 22 bmp Vt Set:  [400 mL] 400 mL PEEP:  [5 cmH20] 5  cmH20 Plateau Pressure:  [14 cmH20-17 cmH20] 14 cmH20   Intake/Output Summary (Last 24 hours) at 02/29/2024 0724 Last data filed at 02/29/2024 0600 Gross per 24 hour  Intake 1960.21 ml  Output 595 ml  Net 1365.21 ml   Filed Weights   02/27/24 1100 02/27/24 1600 02/28/24 0700  Weight: 40 kg 42.7 kg 42.7 kg    Examination: General: critically ill appearing on mech vent HEENT: MM pink/moist; ETT in place Neuro: sedate on prop 50 mcg; following commands; mae CV: s1s2, RRR, no m/r/g PULM:  dim clear BS bilaterally; on mech vent PRVC GI: soft, bsx4 active  Extremities: warm/dry, no edema    Resolved problem list   Assessment and Plan   AECOPD AoC hypoxic/hypercarbic resp failure Recent URI Suspected CAP  -rvp, legionella/strep negative Plan: -attempt sbt and wean sedation as able; consider precedex  to help with weaning -LTVV strategy with tidal volumes of 6-8 cc/kg ideal body weight -Wean PEEP/FiO2 for SpO2 >92% -VAP bundle in place -Daily SAT and SBT -PAD protocol in place -wean sedation for RASS goal 0 to -1 -rocephin /azithro for cap ppx; follow trach aspirate -iv steroids -triple therapy nebs; prn duoneb -uop only 495 last 24 hours; give dose of lasix   Hypotension, improving -- ? Septic shock 2/2 CAP sv sedation related hypotension  Plan: -off levo; map goal >65 -wean sedation for  RASS 0 to -1 -abx as above -follow cultures  Anemia Plan: -trend cbc -transfuse for hgb <7  Severe protein malnutrition Plan: -EN per RD  Hypophosphatemia Hypocalcemia Plan: -replete electrolytes as needed  EtOH use disorder  Plan: -thiamine , folic acid , mvi  PAD Plan: -resume asa and statin  Breast cancer -getting radiation outpt Plan: -f/u outpt   Labs   CBC: Recent Labs  Lab 02/27/24 1113 02/27/24 1157 02/27/24 1526 02/28/24 0242 02/28/24 0503 02/28/24 1122 02/29/24 0427  WBC 13.6*  --   --   --  11.6*  --  11.4*  NEUTROABS 11.6*  --   --   --   --    --   --   HGB 7.7*   < > 8.2* 9.2* 6.9* 6.6* 8.4*  HCT 26.4*   < > 24.0* 27.0* 22.8* 21.1* 26.1*  MCV 109.1*  --   --   --  103.6*  --  97.4  PLT 193  --   --   --  257  --  204   < > = values in this interval not displayed.    Basic Metabolic Panel: Recent Labs  Lab 02/27/24 1113 02/27/24 1157 02/27/24 1438 02/27/24 1526 02/28/24 0242 02/28/24 0503 02/29/24 0427  NA 133*   < > 134* 132* 132* 135 135  K 5.7*   < > 5.0 4.6 4.0 4.1 3.7  CL 90*  --  93*  --   --  94* 96*  CO2 33*  --  28  --   --  29 32  GLUCOSE 94  --  90  --   --  117* 133*  BUN 16  --  15  --   --  10 10  CREATININE 0.45  --  0.47  --   --  0.61 <0.30*  CALCIUM  8.0*  --  7.9*  --   --  7.7* 7.1*  MG  --   --  1.7  --   --  2.4  2.3 2.1  PHOS  --   --   --   --   --  2.1*  2.1* 2.9   < > = values in this interval not displayed.   GFR: CrCl cannot be calculated (This lab value cannot be used to calculate CrCl because it is not a number: <0.30). Recent Labs  Lab 02/27/24 1113 02/27/24 1210 02/27/24 1438 02/27/24 1955 02/28/24 0503 02/29/24 0427  PROCALCITON  --   --   --   --  0.43  --   WBC 13.6*  --   --   --  11.6* 11.4*  LATICACIDVEN  --  2.0* 2.5* 1.1 0.7  --     Liver Function Tests: Recent Labs  Lab 02/27/24 1113  AST 32  ALT 19  ALKPHOS 115  BILITOT 0.6  PROT 5.8*  ALBUMIN  2.6*   No results for input(s): LIPASE, AMYLASE in the last 168 hours. No results for input(s): AMMONIA in the last 168 hours.  ABG    Component Value Date/Time   PHART 7.417 02/28/2024 0242   PCO2ART 53.3 (H) 02/28/2024 0242   PO2ART 84 02/28/2024 0242   HCO3 34.3 (H) 02/28/2024 0242   TCO2 36 (H) 02/28/2024 0242   O2SAT 96 02/28/2024 0242     Coagulation Profile: Recent Labs  Lab 02/27/24 1113  INR 1.0    Cardiac Enzymes: No results for input(s): CKTOTAL, CKMB, CKMBINDEX, TROPONINI in the last 168 hours.  HbA1C: Hgb  A1c MFr Bld  Date/Time Value Ref Range Status  02/27/2024  02:38 PM 4.0 (L) 4.8 - 5.6 % Final    Comment:    (NOTE) Diagnosis of Diabetes The following HbA1c ranges recommended by the American Diabetes Association (ADA) may be used as an aid in the diagnosis of diabetes mellitus.  Hemoglobin             Suggested A1C NGSP%              Diagnosis  <5.7                   Non Diabetic  5.7-6.4                Pre-Diabetic  >6.4                   Diabetic  <7.0                   Glycemic control for                       adults with diabetes.    10/01/2021 03:42 PM 4.8 4.8 - 5.6 % Final    Comment:             Prediabetes: 5.7 - 6.4          Diabetes: >6.4          Glycemic control for adults with diabetes: <7.0     CBG: Recent Labs  Lab 02/28/24 0758 02/28/24 1146 02/28/24 1951 02/28/24 2347 02/29/24 0340  GLUCAP 129* 95 172* 156* 132*     CRITICAL CARE Performed by: Norleen JONETTA Cedar   Total critical care time: 35 minutes  JD Cedar DEVONNA [facility] 02/29/2024, 8:46 AM  Please see [email or web address] for pager details.  From 7A-7P if no response, please call [phone]. After hours, please call ELink [phone].

## 2024-03-01 DIAGNOSIS — J189 Pneumonia, unspecified organism: Secondary | ICD-10-CM | POA: Diagnosis not present

## 2024-03-01 DIAGNOSIS — J441 Chronic obstructive pulmonary disease with (acute) exacerbation: Secondary | ICD-10-CM | POA: Diagnosis not present

## 2024-03-01 DIAGNOSIS — J9621 Acute and chronic respiratory failure with hypoxia: Secondary | ICD-10-CM | POA: Diagnosis not present

## 2024-03-01 DIAGNOSIS — J9622 Acute and chronic respiratory failure with hypercapnia: Secondary | ICD-10-CM | POA: Diagnosis not present

## 2024-03-01 LAB — CBC
HCT: 30 % — ABNORMAL LOW (ref 36.0–46.0)
Hemoglobin: 9.4 g/dL — ABNORMAL LOW (ref 12.0–15.0)
MCH: 31.1 pg (ref 26.0–34.0)
MCHC: 31.3 g/dL (ref 30.0–36.0)
MCV: 99.3 fL (ref 80.0–100.0)
Platelets: 189 K/uL (ref 150–400)
RBC: 3.02 MIL/uL — ABNORMAL LOW (ref 3.87–5.11)
RDW: 15.8 % — ABNORMAL HIGH (ref 11.5–15.5)
WBC: 11.9 K/uL — ABNORMAL HIGH (ref 4.0–10.5)
nRBC: 0 % (ref 0.0–0.2)

## 2024-03-01 LAB — CULTURE, RESPIRATORY W GRAM STAIN: Culture: NO GROWTH

## 2024-03-01 LAB — BASIC METABOLIC PANEL WITH GFR
Anion gap: 8 (ref 5–15)
Anion gap: 12 (ref 5–15)
BUN: 14 mg/dL (ref 8–23)
BUN: 18 mg/dL (ref 8–23)
CO2: 36 mmol/L — ABNORMAL HIGH (ref 22–32)
CO2: 40 mmol/L — ABNORMAL HIGH (ref 22–32)
Calcium: 8 mg/dL — ABNORMAL LOW (ref 8.9–10.3)
Calcium: 8.2 mg/dL — ABNORMAL LOW (ref 8.9–10.3)
Chloride: 96 mmol/L — ABNORMAL LOW (ref 98–111)
Chloride: 91 mmol/L — ABNORMAL LOW (ref 98–111)
Creatinine, Ser: 0.45 mg/dL (ref 0.44–1.00)
Creatinine, Ser: 0.36 mg/dL — ABNORMAL LOW (ref 0.44–1.00)
GFR, Estimated: >60 mL/min (ref >60)
GFR, Estimated: >60 mL/min (ref >60)
Glucose, Bld: 109 mg/dL — ABNORMAL HIGH (ref 70–99)
Glucose, Bld: 136 mg/dL — ABNORMAL HIGH (ref 70–99)
Potassium: 3.9 mmol/L (ref 3.5–5.1)
Potassium: 3.5 mmol/L (ref 3.5–5.1)
Sodium: 140 mmol/L (ref 135–145)
Sodium: 143 mmol/L (ref 135–145)

## 2024-03-01 LAB — MAGNESIUM: Magnesium: 2.2 mg/dL (ref 1.7–2.4)

## 2024-03-01 LAB — GLUCOSE, CAPILLARY
Glucose-Capillary: 72 mg/dL (ref 70–99)
Glucose-Capillary: 128 mg/dL — ABNORMAL HIGH (ref 70–99)
Glucose-Capillary: 121 mg/dL — ABNORMAL HIGH (ref 70–99)
Glucose-Capillary: 170 mg/dL — ABNORMAL HIGH (ref 70–99)
Glucose-Capillary: 147 mg/dL — ABNORMAL HIGH (ref 70–99)
Glucose-Capillary: 107 mg/dL — ABNORMAL HIGH (ref 70–99)

## 2024-03-01 LAB — PHOSPHORUS: Phosphorus: 3 mg/dL (ref 2.5–4.6)

## 2024-03-01 MED ORDER — SODIUM CHLORIDE 0.9 % IV SOLN
2.0000 g | Freq: Two times a day (BID) | INTRAVENOUS | Status: DC
  Administered 2024-03-01 (×2): 2 g via INTRAVENOUS
  Filled 2024-03-01 (×2): qty 12.5

## 2024-03-01 MED ORDER — FUROSEMIDE 10 MG/ML IJ SOLN
40.0000 mg | Freq: Once | INTRAMUSCULAR | Status: AC
  Administered 2024-03-01: 40 mg via INTRAVENOUS
  Filled 2024-03-01: qty 4

## 2024-03-01 MED ORDER — ALBUMIN HUMAN 25 % IV SOLN
25.0000 g | Freq: Once | INTRAVENOUS | Status: AC
  Administered 2024-03-01: 25 g via INTRAVENOUS
  Filled 2024-03-01: qty 100

## 2024-03-01 NOTE — Plan of Care (Signed)
  Problem: Clinical Measurements: Goal: Ability to maintain clinical measurements within normal limits will improve Outcome: Progressing   Problem: Nutrition: Goal: Adequate nutrition will be maintained Outcome: Progressing   Problem: Elimination: Goal: Will not experience complications related to urinary retention Outcome: Progressing

## 2024-03-01 NOTE — Progress Notes (Signed)
 eLink Physician-Brief Progress Note Patient Name: Beth Daniels DOB: 13-Sep-1945 MRN: [id number]   Date of Service  03/01/2024  HPI/Events of Note  Patient need bilateral wrist restraints order renewed to prevent self-extubation.  eICU Interventions  Restraints order renewed.        [name] U Meric Joye 03/01/2024, 8:43 PM

## 2024-03-01 NOTE — Progress Notes (Signed)
 Advised MD and PA of multiple Fentanyl  doses given due to patients agitation.

## 2024-03-01 NOTE — Progress Notes (Signed)
 NAME:  Beth Daniels, MRN:  [id number], DOB:  1946-03-10, LOS: 3 ADMISSION DATE:  02/27/2024, CONSULTATION DATE: 02/27/2024 REFERRING MD:  Charlyn Sora , CHIEF COMPLAINT: Shortness of breath  History of Present Illness:  78 year old female with chronic hypoxic/hypercapnic respiratory failure due to severe COPD on home oxygen 6 L/min, breast cancer  on radiation therapy, who was brought to the emergency department after she was found lethargic and unresponsive at home.  As per patient's family she has not been feeling well for past few days complaining of shortness of breath and cough with greenish phlegm today she was found unresponsive and not been able to wake up, they called EMS.  Upon EMS arrival patient oxygen saturation was in 60s, she was bagged and route to ED, she was intubated in the emergency department and placed on mechanical ventilator  No fever history, sick contact  Pertinent  Medical History   Past Medical History:  Diagnosis Date   Alcohol  abuse    Anemia    Cancer (HCC)    breast right   Chronic hyponatremia    COPD (chronic obstructive pulmonary disease) (HCC)    DCIS (ductal carcinoma in situ)    Dyspnea    Emphysema of lung (HCC)    Lung cancer (HCC)    has received 2 treatments of radiation   Osteoporosis    Overactive bladder    PVD (peripheral vascular disease) (HCC)    Tobacco use      Significant Hospital Events: Including procedures, antibiotic start and stop dates in addition to other pertinent events   8/23 intubated in ED hypoxic hypercarbic failure, AECOPD   Interim History / Subjective:  Fio2 50% sats 88-90% Attempted sbt but went apneic. Recently received fentanyl  bolus. Trach aspirate on 8/23 showing rare pseudomonas  Objective    Blood pressure 122/65, pulse 87, temperature 97.7 F (36.5 C), resp. rate (!) 22, height 5' 2 (1.575 m), weight 42.7 kg, SpO2 (!) 88%.    Vent Mode: PRVC FiO2 (%):  [50 %] 50 % Set Rate:  [22 bmp] 22 bmp Vt  Set:  [400 mL] 400 mL PEEP:  [5 cmH20] 5 cmH20 Plateau Pressure:  [14 cmH20-18 cmH20] 18 cmH20   Intake/Output Summary (Last 24 hours) at 03/01/2024 0858 Last data filed at 03/01/2024 0800 Gross per 24 hour  Intake 1938.62 ml  Output 1110 ml  Net 828.62 ml   Filed Weights   02/27/24 1100 02/27/24 1600 02/28/24 0700  Weight: 40 kg 42.7 kg 42.7 kg    Examination: General: critically ill appearing on mech vent HEENT: MM pink/moist; ETT in place Neuro: sedate on precedex ; following commands; mae CV: s1s2, RRR, no m/r/g PULM:  dim clear BS bilaterally; on mech vent PRVC GI: soft, bsx4 active  Extremities: warm/dry, no edema    Resolved problem list  Hypotension, improving -- ? Septic shock 2/2 CAP sv sedation related hypotension   Assessment and Plan   AECOPD AoC hypoxic/hypercarbic resp failure Recent URI Suspected CAP: trach aspirate w/ pseudomonas -rvp, legionella/strep negative Plan: -attempt sbt but went apneic; will give dose of lasix  and give time for sedation to wear off and reattempt later today. Ongoing GOC w/ family about potential one way extubation. Family agreed patient would not want to be on ventilator long term and to be trached. Working toward optimizing patient as best we can. Family wants more time to discuss one way extubation. -LTVV strategy with tidal volumes of 6-8 cc/kg ideal body weight -Wean PEEP/FiO2  for SpO2 >92% -VAP bundle in place -Daily SAT and SBT -PAD protocol in place -wean sedation for RASS goal 0 to -1 -broaden to cefepime  w/ possible pseudomonas and cont azithro; follow trach cultures -iv steroids -triple therapy nebs; prn duoneb  Anemia Plan: -trend cbc -transfuse for hgb <7  Severe protein malnutrition Plan: -EN per RD  Hypophosphatemia Hypocalcemia Plan: -replete electrolytes as needed  EtOH use disorder  Plan: -thiamine , folic acid , mvi  PAD Plan: -resume asa and statin  Breast cancer -getting radiation  outpt Plan: -f/u outpt   Labs   CBC: Recent Labs  Lab 02/27/24 1113 02/27/24 1157 02/28/24 0242 02/28/24 0503 02/28/24 1122 02/29/24 0427 03/01/24 0244  WBC 13.6*  --   --  11.6*  --  11.4* 11.9*  NEUTROABS 11.6*  --   --   --   --   --   --   HGB 7.7*   < > 9.2* 6.9* 6.6* 8.4* 9.4*  HCT 26.4*   < > 27.0* 22.8* 21.1* 26.1* 30.0*  MCV 109.1*  --   --  103.6*  --  97.4 99.3  PLT 193  --   --  257  --  204 189   < > = values in this interval not displayed.    Basic Metabolic Panel: Recent Labs  Lab 02/27/24 1113 02/27/24 1157 02/27/24 1438 02/27/24 1526 02/28/24 0242 02/28/24 0503 02/29/24 0427 02/29/24 1852 03/01/24 0244  NA 133*   < > 134* 132* 132* 135 135  --  140  K 5.7*   < > 5.0 4.6 4.0 4.1 3.7 3.5 3.9  CL 90*  --  93*  --   --  94* 96*  --  96*  CO2 33*  --  28  --   --  29 32  --  36*  GLUCOSE 94  --  90  --   --  117* 133*  --  109*  BUN 16  --  15  --   --  10 10  --  14  CREATININE 0.45  --  0.47  --   --  0.61 <0.30*  --  0.45  CALCIUM  8.0*  --  7.9*  --   --  7.7* 7.1*  --  8.0*  MG  --   --  1.7  --   --  2.4  2.3 2.1  --  2.2  PHOS  --   --   --   --   --  2.1*  2.1* 2.9  --  3.0   < > = values in this interval not displayed.   GFR: Estimated Creatinine Clearance: 39.7 mL/min (by C-G formula based on SCr of 0.45 mg/dL). Recent Labs  Lab 02/27/24 1113 02/27/24 1210 02/27/24 1438 02/27/24 1955 02/28/24 0503 02/29/24 0427 03/01/24 0244  PROCALCITON  --   --   --   --  0.43  --   --   WBC 13.6*  --   --   --  11.6* 11.4* 11.9*  LATICACIDVEN  --  2.0* 2.5* 1.1 0.7  --   --     Liver Function Tests: Recent Labs  Lab 02/27/24 1113  AST 32  ALT 19  ALKPHOS 115  BILITOT 0.6  PROT 5.8*  ALBUMIN  2.6*   No results for input(s): LIPASE, AMYLASE in the last 168 hours. No results for input(s): AMMONIA in the last 168 hours.  ABG    Component Value Date/Time   PHART  7.417 02/28/2024 0242   PCO2ART 53.3 (H) 02/28/2024 0242    PO2ART 84 02/28/2024 0242   HCO3 34.3 (H) 02/28/2024 0242   TCO2 36 (H) 02/28/2024 0242   O2SAT 96 02/28/2024 0242     Coagulation Profile: Recent Labs  Lab 02/27/24 1113  INR 1.0    Cardiac Enzymes: No results for input(s): CKTOTAL, CKMB, CKMBINDEX, TROPONINI in the last 168 hours.  HbA1C: Hgb A1c MFr Bld  Date/Time Value Ref Range Status  02/27/2024 02:38 PM 4.0 (L) 4.8 - 5.6 % Final    Comment:    (NOTE) Diagnosis of Diabetes The following HbA1c ranges recommended by the American Diabetes Association (ADA) may be used as an aid in the diagnosis of diabetes mellitus.  Hemoglobin             Suggested A1C NGSP%              Diagnosis  <5.7                   Non Diabetic  5.7-6.4                Pre-Diabetic  >6.4                   Diabetic  <7.0                   Glycemic control for                       adults with diabetes.    10/01/2021 03:42 PM 4.8 4.8 - 5.6 % Final    Comment:             Prediabetes: 5.7 - 6.4          Diabetes: >6.4          Glycemic control for adults with diabetes: <7.0     CBG: Recent Labs  Lab 02/29/24 1521 02/29/24 1943 02/29/24 2331 03/01/24 0350 03/01/24 0739  GLUCAP 135* 185* 179* 72 128*     CRITICAL CARE Performed by: Norleen JONETTA Cedar   Total critical care time: 35 minutes  JD Cedar DEVONNA [facility] 03/01/2024, 8:58 AM  Please see [email or web address] for pager details.  From 7A-7P if no response, please call [phone]. After hours, please call ELink [phone].

## 2024-03-02 DIAGNOSIS — D649 Anemia, unspecified: Secondary | ICD-10-CM | POA: Diagnosis not present

## 2024-03-02 DIAGNOSIS — J9621 Acute and chronic respiratory failure with hypoxia: Secondary | ICD-10-CM | POA: Diagnosis not present

## 2024-03-02 DIAGNOSIS — J441 Chronic obstructive pulmonary disease with (acute) exacerbation: Secondary | ICD-10-CM | POA: Diagnosis not present

## 2024-03-02 DIAGNOSIS — J9622 Acute and chronic respiratory failure with hypercapnia: Secondary | ICD-10-CM | POA: Diagnosis not present

## 2024-03-02 LAB — BASIC METABOLIC PANEL WITH GFR
Anion gap: 7 (ref 5–15)
BUN: 15 mg/dL (ref 8–23)
CO2: 39 mmol/L — ABNORMAL HIGH (ref 22–32)
Calcium: 8.2 mg/dL — ABNORMAL LOW (ref 8.9–10.3)
Chloride: 96 mmol/L — ABNORMAL LOW (ref 98–111)
Creatinine, Ser: 0.39 mg/dL — ABNORMAL LOW (ref 0.44–1.00)
GFR, Estimated: >60 mL/min (ref >60)
Glucose, Bld: 128 mg/dL — ABNORMAL HIGH (ref 70–99)
Potassium: 3.3 mmol/L — ABNORMAL LOW (ref 3.5–5.1)
Sodium: 142 mmol/L (ref 135–145)

## 2024-03-02 LAB — TRIGLYCERIDES: Triglycerides: 54 mg/dL (ref <150)

## 2024-03-02 LAB — MAGNESIUM: Magnesium: 1.7 mg/dL (ref 1.7–2.4)

## 2024-03-02 LAB — CBC
HCT: 28 % — ABNORMAL LOW (ref 36.0–46.0)
Hemoglobin: 8.8 g/dL — ABNORMAL LOW (ref 12.0–15.0)
MCH: 31.5 pg (ref 26.0–34.0)
MCHC: 31.4 g/dL (ref 30.0–36.0)
MCV: 100.4 fL — ABNORMAL HIGH (ref 80.0–100.0)
Platelets: 174 K/uL (ref 150–400)
RBC: 2.79 MIL/uL — ABNORMAL LOW (ref 3.87–5.11)
RDW: 15.1 % (ref 11.5–15.5)
WBC: 9.7 K/uL (ref 4.0–10.5)
nRBC: 0 % (ref 0.0–0.2)

## 2024-03-02 LAB — PHOSPHORUS: Phosphorus: 3.4 mg/dL (ref 2.5–4.6)

## 2024-03-02 LAB — GLUCOSE, CAPILLARY
Glucose-Capillary: 148 mg/dL — ABNORMAL HIGH (ref 70–99)
Glucose-Capillary: 114 mg/dL — ABNORMAL HIGH (ref 70–99)

## 2024-03-02 MED ORDER — MAGNESIUM SULFATE 2 GM/50ML IV SOLN
2.0000 g | Freq: Once | INTRAVENOUS | Status: AC
  Administered 2024-03-02: 2 g via INTRAVENOUS
  Filled 2024-03-02: qty 50

## 2024-03-02 MED ORDER — SODIUM CHLORIDE 0.9 % IV SOLN: INTRAVENOUS | Status: DC

## 2024-03-02 MED ORDER — MIDAZOLAM HCL 2 MG/2ML IJ SOLN
2.0000 mg | INTRAMUSCULAR | Status: DC | PRN
  Administered 2024-03-02: 4 mg via INTRAVENOUS
  Filled 2024-03-02: qty 4

## 2024-03-02 MED ORDER — ONDANSETRON 4 MG PO TBDP
4.0000 mg | ORAL_TABLET | Freq: Four times a day (QID) | ORAL | Status: DC | PRN
Start: 2024-03-02 — End: 2024-03-02

## 2024-03-02 MED ORDER — POTASSIUM CHLORIDE 20 MEQ PO PACK
40.0000 meq | PACK | ORAL | Status: DC
  Administered 2024-03-02: 40 meq
  Filled 2024-03-02: qty 2

## 2024-03-02 MED ORDER — ONDANSETRON HCL 4 MG/2ML IJ SOLN: 4.0000 mg | Freq: Four times a day (QID) | INTRAMUSCULAR | Status: DC | PRN

## 2024-03-02 MED ORDER — GLYCOPYRROLATE 0.2 MG/ML IJ SOLN: 0.2000 mg | INTRAMUSCULAR | Status: DC | PRN

## 2024-03-02 MED ORDER — MORPHINE 100MG IN NS 100ML (1MG/ML) PREMIX INFUSION
0.0000 mg/h | INTRAVENOUS | Status: DC
  Administered 2024-03-02: 5 mg/h via INTRAVENOUS
  Filled 2024-03-02: qty 100

## 2024-03-02 MED ORDER — MORPHINE BOLUS VIA INFUSION
5.0000 mg | INTRAVENOUS | Status: DC | PRN
  Administered 2024-03-02 (×3): 5 mg via INTRAVENOUS

## 2024-03-02 MED ORDER — ACETAMINOPHEN 650 MG RE SUPP
650.0000 mg | Freq: Four times a day (QID) | RECTAL | Status: DC | PRN
Start: 2024-03-02 — End: 2024-03-02

## 2024-03-02 MED ORDER — DIPHENHYDRAMINE HCL 50 MG/ML IJ SOLN: 25.0000 mg | INTRAMUSCULAR | Status: DC | PRN

## 2024-03-02 MED ORDER — GLYCOPYRROLATE 1 MG PO TABS: 1.0000 mg | ORAL_TABLET | ORAL | Status: DC | PRN

## 2024-03-02 MED ORDER — POLYVINYL ALCOHOL 1.4 % OP SOLN
1.0000 [drp] | Freq: Four times a day (QID) | OPHTHALMIC | Status: DC | PRN
Start: 2024-03-02 — End: 2024-03-02

## 2024-03-02 MED ORDER — FUROSEMIDE 10 MG/ML IJ SOLN
40.0000 mg | Freq: Four times a day (QID) | INTRAMUSCULAR | Status: DC
  Administered 2024-03-02: 40 mg via INTRAVENOUS
  Filled 2024-03-02: qty 4

## 2024-03-02 MED ORDER — ACETAMINOPHEN 325 MG PO TABS: 650.0000 mg | ORAL_TABLET | Freq: Four times a day (QID) | ORAL | Status: DC | PRN

## 2024-03-03 DIAGNOSIS — Z66 Do not resuscitate: Secondary | ICD-10-CM

## 2024-03-03 DIAGNOSIS — J181 Lobar pneumonia, unspecified organism: Secondary | ICD-10-CM | POA: Insufficient documentation

## 2024-03-03 DIAGNOSIS — Z515 Encounter for palliative care: Secondary | ICD-10-CM

## 2024-03-04 LAB — CULTURE, BLOOD (ROUTINE X 2)
Culture: NO GROWTH
Special Requests: ADEQUATE

## 2024-03-07 NOTE — Progress Notes (Signed)
 Chaplain went to check on pt Beth Daniels and her family. I confirmed with RN that another chaplain was present before and after she deceased, along with a priest from her parish.

## 2024-03-07 NOTE — Progress Notes (Signed)
NAME:  Beth Daniels, MRN:  [id number], DOB:  1946/05/01, LOS: 4 ADMISSION DATE:  02/27/2024, CONSULTATION DATE: 02/27/2024 REFERRING MD:  Charlyn Sora , CHIEF COMPLAINT: Shortness of breath  History of Present Illness:  78 year old female with chronic hypoxic/hypercapnic respiratory failure due to severe COPD on home oxygen 6 L/min, breast cancer  on radiation therapy, who was brought to the emergency department after she was found lethargic and unresponsive at home.  As per patient's family she has not been feeling well for past few days complaining of shortness of breath and cough with greenish phlegm today she was found unresponsive and not been able to wake up, they called EMS.  Upon EMS arrival patient oxygen saturation was in 60s, she was bagged and route to ED, she was intubated in the emergency department and placed on mechanical ventilator  No fever history, sick contact  Pertinent  Medical History   Past Medical History:  Diagnosis Date   Alcohol  abuse    Anemia    Cancer (HCC)    breast right   Chronic hyponatremia    COPD (chronic obstructive pulmonary disease) (HCC)    DCIS (ductal carcinoma in situ)    Dyspnea    Emphysema of lung (HCC)    Lung cancer (HCC)    has received 2 treatments of radiation   Osteoporosis    Overactive bladder    PVD (peripheral vascular disease) (HCC)    Tobacco use      Significant Hospital Events: Including procedures, antibiotic start and stop dates in addition to other pertinent events   8/23 intubated in ED hypoxic hypercarbic failure, AECOPD   Interim History / Subjective:  Did not tolerate pressure support more hypoxic.  Likely developing weakness more time on ventilator.  Multiple interventions to try to improve things made via the ventilator at my direction throughout the morning.  See goals of care note, plan for comfort later in the day.  Objective    Blood pressure 124/62, pulse 66, temperature (!) 96.8 F (36 C), resp. rate  17, height 5' 2 (1.575 m), weight 33.9 kg, SpO2 95%.    Vent Mode: PRVC FiO2 (%):  [50 %-60 %] 50 % Set Rate:  [22 bmp] 22 bmp Vt Set:  [400 mL] 400 mL PEEP:  [5 cmH20] 5 cmH20 Pressure Support:  [8 cmH20] 8 cmH20 Plateau Pressure:  [16 cmH20] 16 cmH20   Intake/Output Summary (Last 24 hours) at 02/20/2024 0923 Last data filed at 02/25/2024 0600 Gross per 24 hour  Intake 1556.05 ml  Output 2435 ml  Net -878.95 ml   Filed Weights   02/27/24 1600 02/28/24 0700 03/01/24 9297  Weight: 42.7 kg 42.7 kg 33.9 kg    Examination: General: critically ill appearing on mech vent HEENT: MM pink/moist; ETT in place Neuro: Appears in distress will follow commands at times CV: s1s2, RRR, no m/r/g PULM:  dim clear BS bilaterally; on mech vent PRVC GI: soft, bsx4 active  Extremities: warm/dry, no edema    Resolved problem list  Hypotension, improving -- ? Septic shock 2/2 CAP sv sedation related hypotension   Assessment and Plan   AECOPD AoC hypoxic/hypercarbic resp failure Recent URI Suspected CAP: trach aspirate w/ pseudomonas -rvp, legionella/strep negative Plan: - Will liberalize from ventilator and stop antibiotics for comfort in the coming hours - Start morphine  gtt., other medicines for comfort  Anemia Plan: - No transfusions required, stopping lab checks  Severe protein malnutrition Plan: - Will stop tube feeds  Labs   CBC: Recent Labs  Lab 02/27/24 1113 02/27/24 1157 02/28/24 0503 02/28/24 1122 02/29/24 0427 03/01/24 0244 02/15/2024 0345  WBC 13.6*  --  11.6*  --  11.4* 11.9* 9.7  NEUTROABS 11.6*  --   --   --   --   --   --   HGB 7.7*   < > 6.9* 6.6* 8.4* 9.4* 8.8*  HCT 26.4*   < > 22.8* 21.1* 26.1* 30.0* 28.0*  MCV 109.1*  --  103.6*  --  97.4 99.3 100.4*  PLT 193  --  257  --  204 189 174   < > = values in this interval not displayed.    Basic Metabolic Panel: Recent Labs  Lab 02/27/24 1438 02/27/24 1526 02/28/24 0503 02/29/24 0427 02/29/24 1852  03/01/24 0244 03/01/24 1935 02/25/2024 0345  NA 134*   < > 135 135  --  140 143 142  K 5.0   < > 4.1 3.7 3.5 3.9 3.5 3.3*  CL 93*  --  94* 96*  --  96* 91* 96*  CO2 28  --  29 32  --  36* 40* 39*  GLUCOSE 90  --  117* 133*  --  109* 136* 128*  BUN 15  --  10 10  --  14 18 15   CREATININE 0.47  --  0.61 <0.30*  --  0.45 0.36* 0.39*  CALCIUM  7.9*  --  7.7* 7.1*  --  8.0* 8.2* 8.2*  MG 1.7  --  2.4  2.3 2.1  --  2.2  --  1.7  PHOS  --   --  2.1*  2.1* 2.9  --  3.0  --  3.4   < > = values in this interval not displayed.   GFR: Estimated Creatinine Clearance: 31.5 mL/min (A) (by C-G formula based on SCr of 0.39 mg/dL (L)). Recent Labs  Lab 02/27/24 1210 02/27/24 1438 02/27/24 1955 02/28/24 0503 02/29/24 0427 03/01/24 0244 02/16/2024 0345  PROCALCITON  --   --   --  0.43  --   --   --   WBC  --   --   --  11.6* 11.4* 11.9* 9.7  LATICACIDVEN 2.0* 2.5* 1.1 0.7  --   --   --     Liver Function Tests: Recent Labs  Lab 02/27/24 1113  AST 32  ALT 19  ALKPHOS 115  BILITOT 0.6  PROT 5.8*  ALBUMIN  2.6*   No results for input(s): LIPASE, AMYLASE in the last 168 hours. No results for input(s): AMMONIA in the last 168 hours.  ABG    Component Value Date/Time   PHART 7.417 02/28/2024 0242   PCO2ART 53.3 (H) 02/28/2024 0242   PO2ART 84 02/28/2024 0242   HCO3 34.3 (H) 02/28/2024 0242   TCO2 36 (H) 02/28/2024 0242   O2SAT 96 02/28/2024 0242     Coagulation Profile: Recent Labs  Lab 02/27/24 1113  INR 1.0    Cardiac Enzymes: No results for input(s): CKTOTAL, CKMB, CKMBINDEX, TROPONINI in the last 168 hours.  HbA1C: Hgb A1c MFr Bld  Date/Time Value Ref Range Status  02/27/2024 02:38 PM 4.0 (L) 4.8 - 5.6 % Final    Comment:    (NOTE) Diagnosis of Diabetes The following HbA1c ranges recommended by the American Diabetes Association (ADA) may be used as an aid in the diagnosis of diabetes mellitus.  Hemoglobin             Suggested A1C NGSP%  Diagnosis  <5.7                   Non Diabetic  5.7-6.4                Pre-Diabetic  >6.4                   Diabetic  <7.0                   Glycemic control for                       adults with diabetes.    10/01/2021 03:42 PM 4.8 4.8 - 5.6 % Final    Comment:             Prediabetes: 5.7 - 6.4          Diabetes: >6.4          Glycemic control for adults with diabetes: <7.0     CBG: Recent Labs  Lab 03/01/24 1546 03/01/24 2002 03/01/24 2312 02/07/2024 0313 02/28/2024 0741  GLUCAP 170* 147* 107* 148* 114*     CRITICAL CARE Performed by: Donnice SAUNDERS Keimora Swartout   Total critical care time: 31 minutes  Critical care time was exclusive of separately billable procedures and treating other patients.  Critical care was necessary to treat or prevent imminent or life-threatening deterioration.  Critical care was time spent personally by me on the following activities: development of treatment plan with patient and/or surrogate as well as nursing, discussions with consultants, evaluation of patient's response to treatment, examination of patient, obtaining history from patient or surrogate, ordering and performing treatments and interventions, ordering and review of laboratory studies, ordering and review of radiographic studies, pulse oximetry and re-evaluation of patient's condition.   Donnice SAUNDERS Beals, MD Please see [email or web address] for pager details.  From 7A-7P if no response, please call [phone]. After hours, please call ELink [phone].

## 2024-03-07 NOTE — Progress Notes (Signed)
 Per CCM order, RT extubate pt to comfort care/room air.

## 2024-03-07 NOTE — IPAL (Signed)
  Interdisciplinary Goals of Care Family Meeting   Date carried out: 02/08/2024  Location of the meeting: Bedside  Member's involved: Physician and Family Member or next of kin  Durable Power of Attorney or Environmental health practitioner: husband (present)    Discussion: We discussed goals of care for Beth Daniels .  Unfortunate, bit worse today.  Despite aggressive diuresis and net negative she is more hypoxemic.  Unable to sustain pressure support.  Likely worsening neuromuscular weakness, critical illness myopathy etc.  Given her underlying severe respiratory failure it is very unlikely she is going to sustain herself or improved to the point where she could be liberated from the ventilator and continue aggressive medical care.  In fact her chance is worsened every day.  Met with husband and sister at bedside.  After seeing her struggle today they are ready to pursue comfort measures.  Discussed this process in detail.  Orders placed.  Plan to remove from ventilator around 3 PM today after they have talked to meet with the funeral planners at [place].  Code status:   Code Status: Do not attempt resuscitation (DNR) - Comfort care   Disposition: In-patient comfort care  Time spent for the meeting: 10 minutes    Beth JONELLE Beals, MD  02/28/2024, 9:16 AM

## 2024-03-07 NOTE — TOC Progression Note (Signed)
Transition of Care [facility]) - Progression Note    Patient Details  Name: Beth Daniels MRN: [id number] Date of Birth: Mar 24, 1946  Transition of Care [facility]) CM/SW Contact  Tom-Johnson, Harvest Muskrat, RN Phone Number: 02/18/2024, 2:09 PM  Clinical Narrative:     GOC meeting today. Plan to transition to Comfort Care by 3pm today after family meet with  [facility] planners at [place].  CM will continue to follow and render compassionate support at this time.                       Expected Discharge Plan and Services                                               Social Drivers of Health (SDOH) Interventions SDOH Screenings   Food Insecurity: No Food Insecurity (02/27/2024)  Housing: Low Risk  (02/27/2024)  Transportation Needs: No Transportation Needs (02/27/2024)  Utilities: Not At Risk (02/27/2024)  Social Connections: Unknown (11/19/2021)   Received from [facility]  Tobacco Use: Medium Risk (02/27/2024)    Readmission Risk Interventions     No data to display

## 2024-03-07 NOTE — Discharge Summary (Signed)
DEATH SUMMARY   Patient Details  Name: Beth Daniels MRN: [id number] DOB: [birth year]  Admission/Discharge Information   Admit Date:  Mar 25, 2024  Date of Death: Date of Death: 03-29-24  Time of Death: Time of Death: April 04, 1519  Length of Stay: 4  Referring Physician: Onita Rush, MD   Reason(s) for Hospitalization  Pneumonia requiring intubation  Diagnoses  Preliminary cause of death: acute on chronic respiratory failure with hypoxemia Secondary Diagnoses (including complications and co-morbidities):  Principal Problem:   Acute and chronic respiratory failure (acute-on-chronic) (HCC) Active Problems:   COPD with acute exacerbation (HCC)   Alcohol  dependence (HCC)   Malignant neoplasm of lower-outer quadrant of female breast (HCC)   Chronic respiratory failure with hypoxia (HCC)   Pulmonary hypertension (HCC)   DNR (do not resuscitate)   Comfort measures only status   Lobar pneumonia, unspecified organism [facility])   Brief Hospital Course (including significant findings, care, treatment, and services provided and events leading to death)  Beth Daniels is a 78 y.o. year old female who has chronic respiratory failure 6 L at baseline due to severe emphysema/COPD presenting with acute on chronic respite failure requiring intubation.  She was treated IV steroids and IV antibiotics.  Subsequent culture revealed Pseudomonas.  She was switched to ceftriaxone .  She was aggressively diuresed.  Despite all aggressive measures she had no significant improvement in her respiratory status.  She failed pressure support ventilation on multiple occasions.  She had clinical worsening.  Several goals of care meetings were had.  She had a previous DNR.  Unfortunately she was intubated in the ED.  After seeing her struggle over the course of a few days they elected to move forward with comfort measures only.    Pertinent Labs and Studies  Significant Diagnostic Studies DG Abd Portable 1V Result Date:  02/28/2024 CLINICAL DATA:  Gastrointestinal tube present. EXAM: DG ABD PORTABLE 1V COMPARISON:  None Available. FINDINGS: Tip and side port of the enteric tube below the diaphragm in the stomach. No bowel dilatation or evidence of obstruction. Minimal formed stool in the colon. Opacification of the medial lung bases with suspected left pleural effusion. IMPRESSION: Tip and side port of the enteric tube below the diaphragm in the stomach. Electronically Signed   By: Andrea Gasman M.D.   On: 02/28/2024 15:35   DG Chest Port 1 View Result Date: 03-25-2024 CLINICAL DATA:  Questionable sepsis. EXAM: PORTABLE CHEST 1 VIEW COMPARISON:  Chest radiograph dated 07/18/2022. FINDINGS: Endotracheal tube approximately 4 cm above the carina. Enteric tube extends below the diaphragm with tip beyond the inferior margin of the image. Small bilateral pleural effusions and bibasilar atelectasis or infiltrate. There is background of emphysema. Patchy bilateral mid lung field interstitial densities new since the CT of 12/09/2023 most consistent with pneumonia. No pneumothorax. Top-normal cardiac size. No acute osseous pathology. IMPRESSION: 1. Small bilateral pleural effusions and bibasilar atelectasis or infiltrate. 2. Patchy bilateral mid lung field interstitial densities most consistent with pneumonia. 3. Emphysema. Electronically Signed   By: Vanetta Chou M.D.   On: 03/25/24 12:04    Microbiology Recent Results (from the past 240 hours)  Resp panel by RT-PCR (RSV, Flu A&B, Covid)     Status: None   Collection Time: 2024-03-25 11:13 AM   Specimen: Nasal Swab  Result Value Ref Range Status   SARS Coronavirus 2 by RT PCR NEGATIVE NEGATIVE Final   Influenza A by PCR NEGATIVE NEGATIVE Final   Influenza B by PCR NEGATIVE NEGATIVE Final  Comment: (NOTE) The Xpert Xpress SARS-CoV-2/FLU/RSV plus assay is intended as an aid in the diagnosis of influenza from Nasopharyngeal swab specimens and should not be used as a  sole basis for treatment. Nasal washings and aspirates are unacceptable for Xpert Xpress SARS-CoV-2/FLU/RSV testing.  Fact Sheet for Patients: BloggerCourse.com  Fact Sheet for Healthcare Providers: SeriousBroker.it  This test is not yet approved or cleared by the [place]  FDA and has been authorized for detection and/or diagnosis of SARS-CoV-2 by FDA under an Emergency Use Authorization (EUA). This EUA will remain in effect (meaning this test can be used) for the duration of the COVID-19 declaration under Section 564(b)(1) of the Act, 21 U.S.C. section 360bbb-3(b)(1), unless the authorization is terminated or revoked.     Resp Syncytial Virus by PCR NEGATIVE NEGATIVE Final    Comment: (NOTE) Fact Sheet for Patients: BloggerCourse.com  Fact Sheet for Healthcare Providers: SeriousBroker.it  This test is not yet approved or cleared by the [place]  FDA and has been authorized for detection and/or diagnosis of SARS-CoV-2 by FDA under an Emergency Use Authorization (EUA). This EUA will remain in effect (meaning this test can be used) for the duration of the COVID-19 declaration under Section 564(b)(1) of the Act, 21 U.S.C. section 360bbb-3(b)(1), unless the authorization is terminated or revoked.  Performed at [facility], [address], [place], [place] [address]   Blood Culture (routine x 2)     Status: None (Preliminary result)   Collection Time: 02/27/24 11:30 AM   Specimen: BLOOD  Result Value Ref Range Status   Specimen Description BLOOD RIGHT ANTECUBITAL  Final   Special Requests   Final    BOTTLES DRAWN AEROBIC AND ANAEROBIC Blood Culture adequate volume   Culture   Final    NO GROWTH 4 DAYS Performed at [facility], [address], [place], [place] [address]    Report Status PENDING  Incomplete  Blood Culture (routine x 2)     Status:  Abnormal   Collection Time: 02/27/24 11:45 AM   Specimen: BLOOD RIGHT HAND  Result Value Ref Range Status   Specimen Description BLOOD RIGHT HAND  Final   Special Requests   Final    BOTTLES DRAWN AEROBIC AND ANAEROBIC Blood Culture adequate volume   Culture  Setup Time   Final    GRAM POSITIVE COCCI AEROBIC BOTTLE ONLY CRITICAL RESULT CALLED TO, READ BACK BY AND VERIFIED WITH: PHARMD JESSIC MILLEN [id number] AT 1421 BY EC    Culture (A)  Final    STAPHYLOCOCCUS COHNII THE SIGNIFICANCE OF ISOLATING THIS ORGANISM FROM A SINGLE SET OF BLOOD CULTURES WHEN MULTIPLE SETS ARE DRAWN IS UNCERTAIN. PLEASE NOTIFY THE MICROBIOLOGY DEPARTMENT WITHIN ONE WEEK IF SPECIATION AND SENSITIVITIES ARE REQUIRED. Performed at [facility], [address] [place], [place] [address]    Report Status 02/29/2024 FINAL  Final  Blood Culture ID Panel (Reflexed)     Status: Abnormal   Collection Time: 02/27/24 11:45 AM  Result Value Ref Range Status   Enterococcus faecalis NOT DETECTED NOT DETECTED Final   Enterococcus Faecium NOT DETECTED NOT DETECTED Final   Listeria monocytogenes NOT DETECTED NOT DETECTED Final   Staphylococcus species DETECTED (A) NOT DETECTED Final    Comment: CRITICAL RESULT CALLED TO, READ BACK BY AND VERIFIED WITH: PHARMD JESSICA MILLEN [id number] AT 1421 BY EC    Staphylococcus aureus (BCID) NOT DETECTED NOT DETECTED Final   Staphylococcus epidermidis NOT DETECTED NOT DETECTED Final  Staphylococcus lugdunensis NOT DETECTED NOT DETECTED Final   Streptococcus species NOT DETECTED NOT DETECTED Final   Streptococcus agalactiae NOT DETECTED NOT DETECTED Final   Streptococcus pneumoniae NOT DETECTED NOT DETECTED Final   Streptococcus pyogenes NOT DETECTED NOT DETECTED Final   A.calcoaceticus-baumannii NOT DETECTED NOT DETECTED Final   Bacteroides fragilis NOT DETECTED NOT DETECTED Final   Enterobacterales NOT DETECTED NOT DETECTED Final   Enterobacter cloacae complex NOT DETECTED NOT  DETECTED Final   Escherichia coli NOT DETECTED NOT DETECTED Final   Klebsiella aerogenes NOT DETECTED NOT DETECTED Final   Klebsiella oxytoca NOT DETECTED NOT DETECTED Final   Klebsiella pneumoniae NOT DETECTED NOT DETECTED Final   Proteus species NOT DETECTED NOT DETECTED Final   Salmonella species NOT DETECTED NOT DETECTED Final   Serratia marcescens NOT DETECTED NOT DETECTED Final   Haemophilus influenzae NOT DETECTED NOT DETECTED Final   Neisseria meningitidis NOT DETECTED NOT DETECTED Final   Pseudomonas aeruginosa NOT DETECTED NOT DETECTED Final   Stenotrophomonas maltophilia NOT DETECTED NOT DETECTED Final   Candida albicans NOT DETECTED NOT DETECTED Final   Candida auris NOT DETECTED NOT DETECTED Final   Candida glabrata NOT DETECTED NOT DETECTED Final   Candida krusei NOT DETECTED NOT DETECTED Final   Candida parapsilosis NOT DETECTED NOT DETECTED Final   Candida tropicalis NOT DETECTED NOT DETECTED Final   Cryptococcus neoformans/gattii NOT DETECTED NOT DETECTED Final    Comment: Performed at [facility], [address], [place], [place] [address]  MRSA Next Gen by PCR, Nasal     Status: None   Collection Time: 02/27/24  2:26 PM   Specimen: Urine, Catheterized; Nasal Swab  Result Value Ref Range Status   MRSA by PCR Next Gen NOT DETECTED NOT DETECTED Final    Comment: (NOTE) The GeneXpert MRSA Assay (FDA approved for NASAL specimens only), is one component of a comprehensive MRSA colonization surveillance program. It is not intended to diagnose MRSA infection nor to guide or monitor treatment for MRSA infections. Test performance is not FDA approved in patients less than 4 years old. Performed at [facility], [address] [place], [place] [address]   Culture, Respiratory w Gram Stain     Status: None   Collection Time: 02/27/24  2:26 PM   Specimen: Tracheal Aspirate; Respiratory  Result Value Ref Range Status   Specimen Description TRACHEAL ASPIRATE   Final   Special Requests NONE  Final   Gram Stain   Final    RARE SQUAMOUS EPITHELIAL CELLS PRESENT WBC PRESENT,BOTH PMN AND MONONUCLEAR RARE GRAM POSITIVE COCCI    Culture   Final    RARE PSEUDOMONAS AERUGINOSA WITHIN MIXED FLORA Performed at [facility], [address], [place], [place] [address]    Report Status 03/01/2024 FINAL  Final   Organism ID, Bacteria PSEUDOMONAS AERUGINOSA  Final      Susceptibility   Pseudomonas aeruginosa - MIC*    MEROPENEM <=0.25 SENSITIVE Sensitive     CIPROFLOXACIN <=0.06 SENSITIVE Sensitive     IMIPENEM 2 SENSITIVE Sensitive     PIP/TAZO Value in next row Sensitive ug/mL     <=4 SENSITIVEThis is a modified FDA-approved test that has been validated and its performance characteristics determined by the reporting laboratory.  This laboratory is certified under the Clinical Laboratory Improvement Amendments CLIA as qualified to perform high complexity clinical laboratory testing.    CEFEPIME  Value in next row Sensitive      <=4 SENSITIVEThis is  a modified FDA-approved test that has been validated and its performance characteristics determined by the reporting laboratory.  This laboratory is certified under the Clinical Laboratory Improvement Amendments CLIA as qualified to perform high complexity clinical laboratory testing.    CEFTAZIDIME/AVIBACTAM Value in next row Sensitive ug/mL     <=4 SENSITIVEThis is a modified FDA-approved test that has been validated and its performance characteristics determined by the reporting laboratory.  This laboratory is certified under the Clinical Laboratory Improvement Amendments CLIA as qualified to perform high complexity clinical laboratory testing.    CEFTOLOZANE/TAZOBACTAM Value in next row Sensitive ug/mL     <=4 SENSITIVEThis is a modified FDA-approved test that has been validated and its performance characteristics determined by the reporting laboratory.  This laboratory is certified under the Clinical  Laboratory Improvement Amendments CLIA as qualified to perform high complexity clinical laboratory testing.    TOBRAMYCIN Value in next row Sensitive      <=4 SENSITIVEThis is a modified FDA-approved test that has been validated and its performance characteristics determined by the reporting laboratory.  This laboratory is certified under the Clinical Laboratory Improvement Amendments CLIA as qualified to perform high complexity clinical laboratory testing.    CEFTAZIDIME Value in next row Sensitive      <=4 SENSITIVEThis is a modified FDA-approved test that has been validated and its performance characteristics determined by the reporting laboratory.  This laboratory is certified under the Clinical Laboratory Improvement Amendments CLIA as qualified to perform high complexity clinical laboratory testing.    * RARE PSEUDOMONAS AERUGINOSA  Respiratory (~20 pathogens) panel by PCR     Status: None   Collection Time: 02/27/24  5:26 PM   Specimen: Nasopharyngeal Swab; Respiratory  Result Value Ref Range Status   Adenovirus NOT DETECTED NOT DETECTED Final   Coronavirus 229E NOT DETECTED NOT DETECTED Final    Comment: (NOTE) The Coronavirus on the Respiratory Panel, DOES NOT test for the novel  Coronavirus (2019 nCoV)    Coronavirus HKU1 NOT DETECTED NOT DETECTED Final   Coronavirus NL63 NOT DETECTED NOT DETECTED Final   Coronavirus OC43 NOT DETECTED NOT DETECTED Final   Metapneumovirus NOT DETECTED NOT DETECTED Final   Rhinovirus / Enterovirus NOT DETECTED NOT DETECTED Final   Influenza A NOT DETECTED NOT DETECTED Final   Influenza B NOT DETECTED NOT DETECTED Final   Parainfluenza Virus 1 NOT DETECTED NOT DETECTED Final   Parainfluenza Virus 2 NOT DETECTED NOT DETECTED Final   Parainfluenza Virus 3 NOT DETECTED NOT DETECTED Final   Parainfluenza Virus 4 NOT DETECTED NOT DETECTED Final   Respiratory Syncytial Virus NOT DETECTED NOT DETECTED Final   Bordetella pertussis NOT DETECTED NOT DETECTED  Final   Bordetella Parapertussis NOT DETECTED NOT DETECTED Final   Chlamydophila pneumoniae NOT DETECTED NOT DETECTED Final   Mycoplasma pneumoniae NOT DETECTED NOT DETECTED Final    Comment: Performed at [facility], [address] [place], [place] [address]    Lab Basic Metabolic Panel: Recent Labs  Lab 02/27/24 1438 02/27/24 1526 02/28/24 0503 02/29/24 0427 02/29/24 1852 03/01/24 0244 03/01/24 1935 03/03/2024 0345  NA 134*   < > 135 135  --  140 143 142  K 5.0   < > 4.1 3.7 3.5 3.9 3.5 3.3*  CL 93*  --  94* 96*  --  96* 91* 96*  CO2 28  --  29 32  --  36* 40* 39*  GLUCOSE 90  --  117* 133*  --  109*  136* 128*  BUN 15  --  10 10  --  14 18 15   CREATININE 0.47  --  0.61 <0.30*  --  0.45 0.36* 0.39*  CALCIUM  7.9*  --  7.7* 7.1*  --  8.0* 8.2* 8.2*  MG 1.7  --  2.4  2.3 2.1  --  2.2  --  1.7  PHOS  --   --  2.1*  2.1* 2.9  --  3.0  --  3.4   < > = values in this interval not displayed.   Liver Function Tests: Recent Labs  Lab 02/27/24 1113  AST 32  ALT 19  ALKPHOS 115  BILITOT 0.6  PROT 5.8*  ALBUMIN  2.6*   No results for input(s): LIPASE, AMYLASE in the last 168 hours. No results for input(s): AMMONIA in the last 168 hours. CBC: Recent Labs  Lab 02/27/24 1113 02/27/24 1157 02/28/24 0503 02/28/24 1122 02/29/24 0427 03/01/24 0244 02/24/2024 0345  WBC 13.6*  --  11.6*  --  11.4* 11.9* 9.7  NEUTROABS 11.6*  --   --   --   --   --   --   HGB 7.7*   < > 6.9* 6.6* 8.4* 9.4* 8.8*  HCT 26.4*   < > 22.8* 21.1* 26.1* 30.0* 28.0*  MCV 109.1*  --  103.6*  --  97.4 99.3 100.4*  PLT 193  --  257  --  204 189 174   < > = values in this interval not displayed.   Cardiac Enzymes: No results for input(s): CKTOTAL, CKMB, CKMBINDEX, TROPONINI in the last 168 hours. Sepsis Labs: Recent Labs  Lab 02/27/24 1210 02/27/24 1438 02/27/24 1955 02/28/24 0503 02/29/24 0427 03/01/24 0244 02/06/2024 0345  PROCALCITON  --   --   --  0.43  --   --   --   WBC   --   --   --  11.6* 11.4* 11.9* 9.7  LATICACIDVEN 2.0* 2.5* 1.1 0.7  --   --   --     Procedures/Operations  As per EMR   Beth Daniels Karo Rog 03/03/2024, 9:55 AM

## 2024-03-07 NOTE — Progress Notes (Signed)
 Nutrition Brief Note  Chart reviewed. Pt now transitioning to comfort care.  No further nutrition interventions planned at this time.  Please re-consult as needed.   Drusilla Kanner, RDN, LDN Clinical Nutrition See AMiON for contact information.

## 2024-03-07 DEATH — deceased

## 2024-03-17 ENCOUNTER — Ambulatory Visit: Admitting: Neurology

## 2024-04-06 NOTE — Accreditation Note (Signed)
 Death within 24 hours of discontinuation of bilateral soft wrist restraints logged on 03/09/2024 at 1007 by Valentin Lai RN

## 2024-06-14 ENCOUNTER — Other Ambulatory Visit (HOSPITAL_COMMUNITY)

## 2024-06-27 ENCOUNTER — Ambulatory Visit: Admitting: Radiation Oncology
